# Patient Record
Sex: Male | Born: 1975
Health system: Southern US, Community
[De-identification: ages and names within clinical notes are randomized; demographics above are authoritative.]

## PROBLEM LIST (undated history)

## (undated) DIAGNOSIS — I10 Essential (primary) hypertension: Secondary | ICD-10-CM

## (undated) DIAGNOSIS — I219 Acute myocardial infarction, unspecified: Secondary | ICD-10-CM

## (undated) DIAGNOSIS — R42 Dizziness and giddiness: Secondary | ICD-10-CM

## (undated) DIAGNOSIS — T8859XA Other complications of anesthesia, initial encounter: Secondary | ICD-10-CM

## (undated) DIAGNOSIS — Z951 Presence of aortocoronary bypass graft: Secondary | ICD-10-CM

## (undated) DIAGNOSIS — M1990 Unspecified osteoarthritis, unspecified site: Secondary | ICD-10-CM

## (undated) DIAGNOSIS — F419 Anxiety disorder, unspecified: Secondary | ICD-10-CM

## (undated) DIAGNOSIS — R200 Anesthesia of skin: Secondary | ICD-10-CM

## (undated) DIAGNOSIS — T4145XA Adverse effect of unspecified anesthetic, initial encounter: Secondary | ICD-10-CM

## (undated) DIAGNOSIS — N200 Calculus of kidney: Secondary | ICD-10-CM

## (undated) DIAGNOSIS — Z720 Tobacco use: Secondary | ICD-10-CM

## (undated) DIAGNOSIS — I509 Heart failure, unspecified: Secondary | ICD-10-CM

## (undated) DIAGNOSIS — E785 Hyperlipidemia, unspecified: Secondary | ICD-10-CM

## (undated) DIAGNOSIS — E119 Type 2 diabetes mellitus without complications: Secondary | ICD-10-CM

## (undated) HISTORY — PX: ORTHOPEDIC SURGERY: SHX850

## (undated) HISTORY — PX: CORONARY ARTERY BYPASS GRAFT: SHX141

## (undated) HISTORY — PX: APPENDECTOMY: SHX54

## (undated) HISTORY — PX: CARDIAC CATHETERIZATION: SHX172

## (undated) HISTORY — DX: Acute myocardial infarction, unspecified: I21.9

## (undated) HISTORY — DX: Hyperlipidemia, unspecified: E78.5

## (undated) HISTORY — DX: Tobacco use: Z72.0

## (undated) HISTORY — PX: CORONARY STENT PLACEMENT: SHX1402

## (undated) HISTORY — DX: Heart failure, unspecified: I50.9

## (undated) HISTORY — PX: TONSILLECTOMY: SUR1361

---

## 1999-03-05 ENCOUNTER — Emergency Department (HOSPITAL_COMMUNITY): Admission: EM | Admit: 1999-03-05 | Discharge: 1999-03-05 | Payer: Self-pay | Admitting: Emergency Medicine

## 1999-08-21 ENCOUNTER — Emergency Department (HOSPITAL_COMMUNITY): Admission: EM | Admit: 1999-08-21 | Discharge: 1999-08-21 | Payer: Self-pay | Admitting: Emergency Medicine

## 1999-08-21 ENCOUNTER — Encounter: Payer: Self-pay | Admitting: Emergency Medicine

## 2000-02-04 ENCOUNTER — Emergency Department (HOSPITAL_COMMUNITY): Admission: EM | Admit: 2000-02-04 | Discharge: 2000-02-04 | Payer: Self-pay | Admitting: Emergency Medicine

## 2001-07-14 ENCOUNTER — Emergency Department (HOSPITAL_COMMUNITY): Admission: EM | Admit: 2001-07-14 | Discharge: 2001-07-15 | Payer: Self-pay | Admitting: *Deleted

## 2001-11-27 ENCOUNTER — Emergency Department (HOSPITAL_COMMUNITY): Admission: EM | Admit: 2001-11-27 | Discharge: 2001-11-27 | Payer: Self-pay | Admitting: Emergency Medicine

## 2002-03-11 ENCOUNTER — Encounter: Payer: Self-pay | Admitting: Emergency Medicine

## 2002-03-12 ENCOUNTER — Inpatient Hospital Stay (HOSPITAL_COMMUNITY): Admission: EM | Admit: 2002-03-12 | Discharge: 2002-03-13 | Payer: Self-pay | Admitting: Emergency Medicine

## 2002-05-07 ENCOUNTER — Encounter: Payer: Self-pay | Admitting: Emergency Medicine

## 2002-05-07 ENCOUNTER — Emergency Department (HOSPITAL_COMMUNITY): Admission: EM | Admit: 2002-05-07 | Discharge: 2002-05-07 | Payer: Self-pay | Admitting: Emergency Medicine

## 2004-03-07 ENCOUNTER — Emergency Department (HOSPITAL_COMMUNITY): Admission: EM | Admit: 2004-03-07 | Discharge: 2004-03-07 | Payer: Self-pay | Admitting: Emergency Medicine

## 2004-07-30 ENCOUNTER — Emergency Department (HOSPITAL_COMMUNITY): Admission: EM | Admit: 2004-07-30 | Discharge: 2004-07-30 | Payer: Self-pay | Admitting: Emergency Medicine

## 2004-08-20 ENCOUNTER — Ambulatory Visit: Payer: Self-pay | Admitting: Orthopedic Surgery

## 2005-05-29 ENCOUNTER — Emergency Department (HOSPITAL_COMMUNITY): Admission: EM | Admit: 2005-05-29 | Discharge: 2005-05-30 | Payer: Self-pay | Admitting: Emergency Medicine

## 2005-09-21 ENCOUNTER — Emergency Department (HOSPITAL_COMMUNITY): Admission: EM | Admit: 2005-09-21 | Discharge: 2005-09-21 | Payer: Self-pay | Admitting: Emergency Medicine

## 2006-06-25 ENCOUNTER — Emergency Department (HOSPITAL_COMMUNITY): Admission: EM | Admit: 2006-06-25 | Discharge: 2006-06-25 | Payer: Self-pay | Admitting: Emergency Medicine

## 2007-03-31 ENCOUNTER — Ambulatory Visit: Payer: Self-pay | Admitting: Psychiatry

## 2007-03-31 ENCOUNTER — Inpatient Hospital Stay (HOSPITAL_COMMUNITY): Admission: AD | Admit: 2007-03-31 | Discharge: 2007-04-04 | Payer: Self-pay | Admitting: Psychiatry

## 2007-03-31 ENCOUNTER — Emergency Department (HOSPITAL_COMMUNITY): Admission: EM | Admit: 2007-03-31 | Discharge: 2007-03-31 | Payer: Self-pay | Admitting: Emergency Medicine

## 2007-07-26 ENCOUNTER — Emergency Department (HOSPITAL_COMMUNITY): Admission: EM | Admit: 2007-07-26 | Discharge: 2007-07-26 | Payer: Self-pay | Admitting: *Deleted

## 2007-10-26 ENCOUNTER — Emergency Department (HOSPITAL_COMMUNITY): Admission: EM | Admit: 2007-10-26 | Discharge: 2007-10-26 | Payer: Self-pay | Admitting: Emergency Medicine

## 2007-11-04 ENCOUNTER — Emergency Department (HOSPITAL_COMMUNITY): Admission: EM | Admit: 2007-11-04 | Discharge: 2007-11-04 | Payer: Self-pay | Admitting: Emergency Medicine

## 2008-03-17 ENCOUNTER — Inpatient Hospital Stay (HOSPITAL_COMMUNITY): Admission: EM | Admit: 2008-03-17 | Discharge: 2008-03-20 | Payer: Self-pay | Admitting: Cardiology

## 2008-03-17 ENCOUNTER — Ambulatory Visit: Payer: Self-pay | Admitting: Cardiology

## 2008-03-17 ENCOUNTER — Ambulatory Visit: Payer: Self-pay | Admitting: Surgery

## 2008-04-03 ENCOUNTER — Ambulatory Visit: Payer: Self-pay | Admitting: Cardiology

## 2008-04-03 LAB — CONVERTED CEMR LAB
CO2: 24 meq/L (ref 19–32)
Calcium: 9.4 mg/dL (ref 8.4–10.5)
GFR calc Af Amer: 145 mL/min
Potassium: 4.2 meq/L (ref 3.5–5.1)
Sodium: 141 meq/L (ref 135–145)

## 2008-05-01 ENCOUNTER — Ambulatory Visit: Payer: Self-pay | Admitting: Surgery

## 2008-05-03 ENCOUNTER — Ambulatory Visit: Admission: RE | Admit: 2008-05-03 | Discharge: 2008-05-03 | Payer: Self-pay | Admitting: Surgery

## 2008-05-03 ENCOUNTER — Encounter: Payer: Self-pay | Admitting: Surgery

## 2008-05-03 ENCOUNTER — Encounter: Payer: Self-pay | Admitting: Thoracic Surgery (Cardiothoracic Vascular Surgery)

## 2008-05-07 ENCOUNTER — Inpatient Hospital Stay (HOSPITAL_COMMUNITY): Admission: RE | Admit: 2008-05-07 | Discharge: 2008-05-11 | Payer: Self-pay | Admitting: Surgery

## 2008-05-07 ENCOUNTER — Ambulatory Visit: Payer: Self-pay | Admitting: Cardiology

## 2008-05-07 ENCOUNTER — Ambulatory Visit: Payer: Self-pay | Admitting: Surgery

## 2008-05-10 ENCOUNTER — Encounter: Payer: Self-pay | Admitting: Surgery

## 2008-05-15 ENCOUNTER — Ambulatory Visit: Payer: Self-pay | Admitting: Surgery

## 2008-05-15 ENCOUNTER — Encounter: Admission: RE | Admit: 2008-05-15 | Discharge: 2008-05-15 | Payer: Self-pay | Admitting: Surgery

## 2008-05-30 DIAGNOSIS — E782 Mixed hyperlipidemia: Secondary | ICD-10-CM

## 2008-05-30 DIAGNOSIS — F17200 Nicotine dependence, unspecified, uncomplicated: Secondary | ICD-10-CM | POA: Insufficient documentation

## 2008-05-31 ENCOUNTER — Ambulatory Visit: Payer: Self-pay | Admitting: Cardiology

## 2008-06-05 ENCOUNTER — Ambulatory Visit: Payer: Self-pay | Admitting: Surgery

## 2008-09-26 ENCOUNTER — Ambulatory Visit: Payer: Self-pay | Admitting: Cardiology

## 2008-09-26 ENCOUNTER — Inpatient Hospital Stay (HOSPITAL_COMMUNITY): Admission: EM | Admit: 2008-09-26 | Discharge: 2008-09-28 | Payer: Self-pay | Admitting: Emergency Medicine

## 2008-09-26 ENCOUNTER — Encounter: Payer: Self-pay | Admitting: Emergency Medicine

## 2008-10-13 DIAGNOSIS — J40 Bronchitis, not specified as acute or chronic: Secondary | ICD-10-CM

## 2008-10-13 DIAGNOSIS — I219 Acute myocardial infarction, unspecified: Secondary | ICD-10-CM

## 2008-10-13 HISTORY — DX: Acute myocardial infarction, unspecified: I21.9

## 2008-10-16 ENCOUNTER — Ambulatory Visit: Payer: Self-pay | Admitting: Cardiology

## 2008-10-16 ENCOUNTER — Encounter: Payer: Self-pay | Admitting: Cardiology

## 2008-12-02 ENCOUNTER — Emergency Department (HOSPITAL_COMMUNITY): Admission: EM | Admit: 2008-12-02 | Discharge: 2008-12-02 | Payer: Self-pay | Admitting: Emergency Medicine

## 2008-12-04 ENCOUNTER — Ambulatory Visit: Payer: Self-pay | Admitting: Cardiology

## 2008-12-20 ENCOUNTER — Telehealth: Payer: Self-pay | Admitting: Cardiology

## 2008-12-24 ENCOUNTER — Telehealth: Payer: Self-pay | Admitting: Cardiology

## 2009-02-20 ENCOUNTER — Telehealth (INDEPENDENT_AMBULATORY_CARE_PROVIDER_SITE_OTHER): Payer: Self-pay | Admitting: *Deleted

## 2009-02-25 ENCOUNTER — Telehealth: Payer: Self-pay | Admitting: Cardiology

## 2009-02-26 ENCOUNTER — Encounter (INDEPENDENT_AMBULATORY_CARE_PROVIDER_SITE_OTHER): Payer: Self-pay | Admitting: *Deleted

## 2009-03-17 ENCOUNTER — Emergency Department (HOSPITAL_COMMUNITY): Admission: EM | Admit: 2009-03-17 | Discharge: 2009-03-17 | Payer: Self-pay | Admitting: Emergency Medicine

## 2009-04-16 ENCOUNTER — Ambulatory Visit: Payer: Self-pay | Admitting: Cardiology

## 2009-04-22 ENCOUNTER — Telehealth: Payer: Self-pay | Admitting: Cardiology

## 2009-05-08 ENCOUNTER — Emergency Department (HOSPITAL_COMMUNITY): Admission: EM | Admit: 2009-05-08 | Discharge: 2009-05-08 | Payer: Self-pay | Admitting: Emergency Medicine

## 2009-06-19 ENCOUNTER — Telehealth: Payer: Self-pay | Admitting: Cardiology

## 2009-07-05 ENCOUNTER — Telehealth: Payer: Self-pay | Admitting: Cardiology

## 2009-07-22 ENCOUNTER — Telehealth: Payer: Self-pay | Admitting: Cardiology

## 2009-08-13 ENCOUNTER — Telehealth: Payer: Self-pay | Admitting: Cardiology

## 2009-08-15 ENCOUNTER — Encounter (INDEPENDENT_AMBULATORY_CARE_PROVIDER_SITE_OTHER): Payer: Self-pay | Admitting: *Deleted

## 2009-09-01 ENCOUNTER — Emergency Department (HOSPITAL_COMMUNITY): Admission: EM | Admit: 2009-09-01 | Discharge: 2009-09-01 | Payer: Self-pay | Admitting: Emergency Medicine

## 2009-09-11 ENCOUNTER — Telehealth (INDEPENDENT_AMBULATORY_CARE_PROVIDER_SITE_OTHER): Payer: Self-pay | Admitting: *Deleted

## 2009-10-23 ENCOUNTER — Telehealth: Payer: Self-pay | Admitting: Cardiology

## 2009-10-23 ENCOUNTER — Emergency Department (HOSPITAL_COMMUNITY): Admission: EM | Admit: 2009-10-23 | Discharge: 2009-10-23 | Payer: Self-pay | Admitting: Emergency Medicine

## 2009-10-23 ENCOUNTER — Telehealth (INDEPENDENT_AMBULATORY_CARE_PROVIDER_SITE_OTHER): Payer: Self-pay | Admitting: *Deleted

## 2009-10-29 ENCOUNTER — Telehealth: Payer: Self-pay | Admitting: Cardiology

## 2010-01-22 ENCOUNTER — Telehealth: Payer: Self-pay | Admitting: Cardiology

## 2010-03-24 ENCOUNTER — Ambulatory Visit: Payer: Self-pay | Admitting: Internal Medicine

## 2010-03-24 ENCOUNTER — Inpatient Hospital Stay (HOSPITAL_COMMUNITY): Admission: EM | Admit: 2010-03-24 | Discharge: 2010-03-26 | Payer: Self-pay | Admitting: Emergency Medicine

## 2010-04-09 ENCOUNTER — Emergency Department (HOSPITAL_COMMUNITY): Admission: EM | Admit: 2010-04-09 | Discharge: 2010-04-09 | Payer: Self-pay | Admitting: Emergency Medicine

## 2010-04-11 ENCOUNTER — Emergency Department (HOSPITAL_COMMUNITY): Admission: EM | Admit: 2010-04-11 | Discharge: 2010-04-11 | Payer: Self-pay | Admitting: Family Medicine

## 2010-04-25 ENCOUNTER — Ambulatory Visit: Payer: Self-pay | Admitting: Cardiology

## 2010-04-30 ENCOUNTER — Telehealth: Payer: Self-pay | Admitting: Cardiology

## 2010-05-05 ENCOUNTER — Encounter (INDEPENDENT_AMBULATORY_CARE_PROVIDER_SITE_OTHER): Payer: Self-pay | Admitting: *Deleted

## 2010-05-19 ENCOUNTER — Ambulatory Visit: Payer: Self-pay | Admitting: Cardiology

## 2010-05-19 ENCOUNTER — Emergency Department (HOSPITAL_COMMUNITY): Admission: EM | Admit: 2010-05-19 | Discharge: 2010-05-19 | Payer: Self-pay | Admitting: Emergency Medicine

## 2010-07-25 ENCOUNTER — Telehealth: Payer: Self-pay | Admitting: Cardiovascular Disease

## 2010-08-09 ENCOUNTER — Emergency Department (HOSPITAL_COMMUNITY)
Admission: EM | Admit: 2010-08-09 | Discharge: 2010-08-09 | Payer: Self-pay | Source: Home / Self Care | Admitting: Emergency Medicine

## 2010-08-12 LAB — URINALYSIS, ROUTINE W REFLEX MICROSCOPIC
Specific Gravity, Urine: >1.03 — ABNORMAL HIGH (ref 1.005–1.030)
Urine Glucose, Fasting: NEGATIVE mg/dL
pH: 5.5 (ref 5.0–8.0)

## 2010-08-12 LAB — COMPREHENSIVE METABOLIC PANEL
AST: 23 U/L (ref 0–37)
Albumin: 4.3 g/dL (ref 3.5–5.2)
BUN: 9 mg/dL (ref 6–23)
CO2: 23 mEq/L (ref 19–32)
Calcium: 9.2 mg/dL (ref 8.4–10.5)
Chloride: 107 mEq/L (ref 96–112)
Creatinine, Ser: 1.01 mg/dL (ref 0.4–1.5)
Glucose, Bld: 101 mg/dL — ABNORMAL HIGH (ref 70–99)
Potassium: 3.6 mEq/L (ref 3.5–5.1)
Sodium: 139 mEq/L (ref 135–145)
Total Bilirubin: 0.6 mg/dL (ref 0.3–1.2)
Total Protein: 7.7 g/dL (ref 6.0–8.3)

## 2010-08-12 LAB — CBC
HCT: 45.2 % (ref 39.0–52.0)
Hemoglobin: 15.7 g/dL (ref 13.0–17.0)
MCH: 31 pg (ref 26.0–34.0)
Platelets: 276 10*3/uL (ref 150–400)
RBC: 5.06 MIL/uL (ref 4.22–5.81)
WBC: 8.7 10*3/uL (ref 4.0–10.5)

## 2010-08-12 LAB — DIFFERENTIAL
Basophils Absolute: 0 10*3/uL (ref 0.0–0.1)
Basophils Relative: 0 % (ref 0–1)
Eosinophils Relative: 2 % (ref 0–5)
Neutro Abs: 4.8 10*3/uL (ref 1.7–7.7)

## 2010-08-12 LAB — POCT CARDIAC MARKERS: Troponin i, poc: <0.05 ng/mL (ref 0.00–0.09)

## 2010-08-19 NOTE — Progress Notes (Signed)
Summary: refill  Phone Note Refill Request Message from:  Patient on October 23, 2009 10:48 AM  Refills Requested: Medication #1:  SIMVASTATIN 80MG  qd   Supply Requested: 3 months  Medication #2:  METOPROLOL TARTRATE 25 MG TABS Take one tablet by mouth twice a day   Supply Requested: 3 months [facility] in [place]   Method Requested: Fax to Local Pharmacy Initial call taken by: Migdalia Dk,  October 23, 2009 10:48 AM  Follow-up for Phone Call        Rx faxed to pharmacy Follow-up by: Oswald Hillock,  October 23, 2009 4:15 PM    Prescriptions: SIMVASTATIN 80MG  qd  #30 x 3   Entered by:   Oswald Hillock   Authorized by:   Lenoria Farrier, MD, [facility]   Signed by:   Oswald Hillock on 10/23/2009   Method used:   Faxed to ...       [address] [phone]* (retail)       [address]       [place], [place]  [address]       Ph: [phone] or [phone]       Fax: [phone]   RxID:   [id number] METOPROLOL TARTRATE 25 MG TABS (METOPROLOL TARTRATE) Take one tablet by mouth twice a day  #60 x 3   Entered by:   Oswald Hillock   Authorized by:   Lenoria Farrier, MD, [facility]   Signed by:   Oswald Hillock on 10/23/2009   Method used:   Faxed to ...       [address] [phone]* (retail)       [address]       [place], [place]  [address]       Ph: [phone] or [phone]       Fax: [phone]   RxID:   [id number]

## 2010-08-19 NOTE — Progress Notes (Signed)
Summary: Plavix   Phone Note Outgoing Call Call back at [phone]   Call placed by: Sherri Rad, RN, BSN,  August 13, 2009 10:16 AM Call placed to: Patient Summary of Call: [facility] to let him know that Plavix has been delivered from [facility]. Sherri Rad, RN, BSN  August 13, 2009 10:18 AM  [facility]. Sherri Rad, RN, BSN  August 14, 2009 10:54 AM  Letter mailed to pt to come pick up Plavix.  Initial call taken by: Sherri Rad, RN, BSN,  August 15, 2009 11:52 AM

## 2010-08-19 NOTE — Letter (Signed)
Summary: Generic Letter  Architectural technologist, Main Office  [address]   [place], [place] [address]   Phone: [phone]  Fax: [phone]        August 15, 2009 MRN: [id number]    [name] [address] [place], [place]  [address]    Dear [name],  I have been trying to reach you by phone to let you know that we have received your Plavix from the drug company. This has been placed at the front desk with your name on it. Please come by to pick this up at your convenience. Thank you.        Sincerely,  Sherri Rad, RN, BSN  This letter has been electronically signed by your physician.

## 2010-08-19 NOTE — Assessment & Plan Note (Signed)
Summary: eph   Visit Type:  Follow-up Primary Provider:  none   History of Present Illness: [name] is 35 years old and return for followup management of CAD. In 2009 he had an inferior MI treated with a bare-metal stent to the RCA and subsequently he had bypass surgery for multivessel disease. In March of 2010 he had a non-ST elevation MI and was found to have occluded vein graft to the diagonal branch the LAD and occluded vein graft the circumflex artery. He underwent a drug and stent to the circumflex artery.  He did well after that but in September of 2001 was admitted with chest pain. Catheterization showed minimal change O. there was some worsening and a small posterolateral branch. We elected medical treatment.  He has done well since that time has had no recurrent chest pain shortness breath or palpitations.  He works third shift at [employer] now. This is been a little difficult for him.  His past history is significant for hyperlipidemia and has and he has a previous history of alcohol and drug abuse.  Current Medications (verified): 1)  Metoprolol Tartrate 25 Mg Tabs (Metoprolol Tartrate) .... Take One Tablet By Mouth Twice A Day 2)  Simvastatin 80mg  .... Qd 3)  Plavix 75 Mg Tabs (Clopidogrel Bisulfate) .... Take One Tablet By Mouth Daily 4)  Nitroglycerin 0.4 Mg Subl (Nitroglycerin) .... One Tablet Under Tongue Every 5 Minutes As Needed For Chest Pain---May Repeat Times Three  Allergies (verified): 1)  ! Codeine  Past History:  Past Medical History: Reviewed history from 10/13/2008 and no changes required. Current Problems:  MYOCARDIAL INFARCTION (ICD-410.90) BRONCHITIS (ICD-490) TOBACCO USE (ICD-305.1) HYPERLIPIDEMIA-MIXED (ICD-272.4) CAD, AUTOLOGOUS BYPASS GRAFT (ICD-414.02)         status post recent diaphragmatic wall          infarction Rx [facility] RCA 03-11-08 with subsequent CABG 05-07-08.  Hx prior EtOH and substance abuse  Good left ventricular function.  Cigarette use.   Review of Systems       ROS is negative except as outlined in HPI.   Vital Signs:  Patient profile:   35 year old male Height:      69 inches Weight:      277 pounds BMI:     41.05 Pulse rate:   79 / minute BP sitting:   134 / 85  (left arm) Cuff size:   large  Vitals Entered By: Burnett Kanaris, CNA (April 25, 2010 10:14 AM)  Physical Exam  Additional Exam:  Gen. Well-nourished, in no distress   Neck: No JVD, thyroid not enlarged, no carotid bruits Lungs: No tachypnea, clear without rales, rhonchi or wheezes Cardiovascular: Rhythm regular, PMI not displaced,  heart sounds  normal, no murmurs or gallops, no peripheral edema, pulses normal in all 4 extremities. Abdomen: BS normal, abdomen soft and non-tender without masses or organomegaly, no hepatosplenomegaly. MS: No deformities, no cyanosis or clubbing   Neuro:  No focal sns   Skin:  no lesions    Impression & Recommendations:  Problem # 1:  CAD, AUTOLOGOUS BYPASS GRAFT (ICD-414.02) He had previous bypass surgery and multiple PCI procedures as described in history of present illness. A recent catheterization which showed little change. He's had no recurrent chest pain this problem appears stable. His updated medication list for this problem includes:    Metoprolol Tartrate 25 Mg Tabs (Metoprolol tartrate) .Marland Kitchen... Take two tablets by mouth every morning and one tablet by mouth every evening  Plavix 75 Mg Tabs (Clopidogrel bisulfate) .Marland Kitchen... Take one tablet by mouth daily    Nitroglycerin 0.4 Mg Subl (Nitroglycerin) ..... One tablet under tongue every 5 minutes as needed for chest pain---may repeat times three  Problem # 2:  HYPERLIPIDEMIA-MIXED (ICD-272.4) This is managed with simvastatin.  Patient Instructions: 1)  Increase metoprolol 25mg  to two tablets every morning and one tablet every evening. 2)  Your physician wants you to follow-up in: 6 months with Dr. Clifton James.  You will receive a reminder letter  in the mail two months in advance. If you don't receive a letter, please call our office to schedule the follow-up appointment. Prescriptions: METOPROLOL TARTRATE 25 MG TABS (METOPROLOL TARTRATE) Take two tablets by mouth every morning and one tablet by mouth every evening  #270 x 3   Entered by:   Sherri Rad, RN, BSN   Authorized by:   Lenoria Farrier, MD, [facility]   Signed by:   Sherri Rad, RN, BSN on 04/25/2010   Method used:   Electronically to        [facility]  [address] (retail)       [address]       [place], [place]  [address]       Ph: [phone]       Fax: [phone]   RxID:   [id number]

## 2010-08-19 NOTE — Progress Notes (Signed)
Summary: Plavix- [facility]   Phone Note Outgoing Call   Call placed by: Sherri Rad, RN, BSN,  April 30, 2010 3:13 PM Call placed to: Patient Summary of Call: I attempted to call the pt to let him know that his plavix has arrived from [facility] and has been placed at the front desk. No answer x 2 tries- phone rings until it cuts off. I will call back. Sherri Rad, RN, BSN  April 30, 2010 3:13 PM  I left a message for the pt to call. Sherri Rad, RN, BSN  April 30, 2010 3:55 PM  I left a message for the pt to call Sherri Rad, RN, BSN  May 01, 2010 5:47 PM  I have attempted to call the pt twice today and I only get is voice mail. Letter mailed to the pt. Initial call taken by: Sherri Rad, RN, BSN,  May 05, 2010 6:03 PM

## 2010-08-19 NOTE — Progress Notes (Signed)
Summary: order plavix  Phone Note Call from Patient Call back at [phone]   Caller: Spouse Reason for Call: Talk to Nurse Summary of Call: needs plavix order from [facility] Initial call taken by: Migdalia Dk,  October 23, 2009 10:46 AM  Follow-up for Phone Call        I left a message at the pt's contact # that I have called in his RX refill and I will contact him when it is in.   Follow-up by: Sherri Rad, RN, BSN,  October 23, 2009 2:36 PM

## 2010-08-19 NOTE — Progress Notes (Signed)
  Request Recieved from [facility] forwarded to [facility] for processing [facility]  September 11, 2009 11:49 AM

## 2010-08-19 NOTE — Progress Notes (Signed)
Summary: pt needs refill called into [facility]  Phone Note Refill Request Call back at [phone] Message from:  Patient on [facility]  Refills Requested: Medication #1:  PLAVIX 75 MG TABS Take one tablet by mouth daily pt needs refill call to [facility]  Initial call taken by: Omer Jack,  January 22, 2010 11:14 AM  Follow-up for Phone Call        Refill ordered. I have left a message on the pt's VM this has been done and I will call when it arrives. Follow-up by: Sherri Rad, RN, BSN,  January 22, 2010 2:06 PM     Appended Document: pt needs refill called into [facility] message left for pt, plavix at the front desk for pick up

## 2010-08-19 NOTE — Letter (Signed)
Summary: Generic Letter  Architectural technologist, Main Office  [address]   [place], [place] [address]   Phone: [phone]  Fax: [phone]        May 05, 2010 MRN: [id number]    PARTICK MUSSELMAN [address] [place], [place]  [address]    Dear Mr. THOMA,  I have attempted to reach you by phone to let you know your Plavix is in the office from the drug company. This has been placed at the front desk and you may pick it up anytime.         Sincerely,  Sherri Rad, RN, BSN  This letter has been electronically signed by your physician.

## 2010-08-19 NOTE — Progress Notes (Signed)
Summary: Plavix  Phone Note Outgoing Call   Call placed by: Sherri Rad, RN, BSN,  October 29, 2009 3:22 PM Call placed to: Patient Summary of Call: I left a message with the pt's family member to please let him know his Plavix has come to the office and he can pick it up any time.  Initial call taken by: Sherri Rad, RN, BSN,  October 29, 2009 3:22 PM

## 2010-08-19 NOTE — Progress Notes (Signed)
Summary: NEED SAMPLES OF PLAVIX  Phone Note Call from Patient Call back at [phone]   Caller: Patient Summary of Call: NEED SAMPLES OF PLAVIX Initial call taken by: Judie Grieve,  July 22, 2009 9:12 AM  Follow-up for Phone Call        Patient phoned  to see if medication has arrived.  Would like samples, if available.  Follow-up by: Burnard Leigh,  July 23, 2009 11:24 AM  Additional Follow-up for Phone Call Additional follow up Details #1::        returned call no aswer -left message for pt to  call back Additional Follow-up by: Burnett Kanaris, CNA,  July 23, 2009 1:38 PM     Appended Document: NEED SAMPLES OF PLAVIX left samples at front desk 8:25am 07-24-2008 DAJ

## 2010-08-21 NOTE — Progress Notes (Signed)
Summary: [facility] renewal application  Phone Note Outgoing Call   Call placed by: Sherri Rad, RN, BSN,  July 25, 2010 11:54 AM Call placed to: Patient Summary of Call: I left a message for the pt to call regarding Plavix renewal application. He is due to re-submit this now. Sherri Rad, RN, BSN  July 25, 2010 11:55 AM   I spoke with the pt. He will try to go to the Unemployment office on monday to get a proof of income statement. He will then try to come to the office to sign his form for [facility]. Sherri Rad, RN, BSN  July 25, 2010 12:54 PM   Follow-up for Phone Call        per pt calling back [phone] Lorne Skeens  July 25, 2010 12:17 PM   Form still at front desk for pt to complete. Sherri Rad, RN, BSN  July 30, 2010 6:07 PM   I checked the front desk today- pt's enevelop is gone. Follow-up by: Sherri Rad, RN, BSN,  August 04, 2010 3:04 PM  Additional Follow-up for Phone Call Additional follow up Details #1::        Per Alphonzo Lemmings Aaron-RN for Dr. Clifton James, the pt did sign his renewal form and she has faxed this back to [facility].  Additional Follow-up by: Sherri Rad, RN, BSN,  August 05, 2010 11:29 AM

## 2010-09-22 ENCOUNTER — Telehealth: Payer: Self-pay | Admitting: Cardiovascular Disease

## 2010-09-30 NOTE — Progress Notes (Signed)
Summary: refill to pt assistance program  Phone Note Refill Request Call back at [phone] Message from:  Patient  Refills Requested: Medication #1:  PLAVIX 75 MG TABS Take one tablet by mouth daily pt needs it called into the assistance program  Initial call taken by: Omer Jack,  September 22, 2010 2:25 PM  Follow-up for Phone Call        Called patient and LVMT[facility]. His Plavix has been @ the front desk since Jan., 2012. Whitney Maeola Sarah RN  September 23, 2010 5:31 PM  Follow-up by: Whitney Maeola Sarah RN,  September 23, 2010 5:31 PM     Appended Document: refill to pt assistance program I will keep his Plavix in my cart until patient comes to pick up*

## 2010-10-01 LAB — BASIC METABOLIC PANEL
CO2: 23 mEq/L (ref 19–32)
Calcium: 9.4 mg/dL (ref 8.4–10.5)
Glucose, Bld: 187 mg/dL — ABNORMAL HIGH (ref 70–99)
Sodium: 135 mEq/L (ref 135–145)

## 2010-10-01 LAB — POCT CARDIAC MARKERS
CKMB, poc: <1 ng/mL — ABNORMAL LOW (ref 1.0–8.0)
CKMB, poc: <1 ng/mL — ABNORMAL LOW (ref 1.0–8.0)
Myoglobin, poc: 40.8 ng/mL (ref 12–200)
Troponin i, poc: <0.05 ng/mL (ref 0.00–0.09)

## 2010-10-02 LAB — BASIC METABOLIC PANEL
BUN: 6 mg/dL (ref 6–23)
BUN: 6 mg/dL (ref 6–23)
CO2: 23 mEq/L (ref 19–32)
Calcium: 9.5 mg/dL (ref 8.4–10.5)
Creatinine, Ser: 0.85 mg/dL (ref 0.4–1.5)
GFR calc non Af Amer: >60 mL/min (ref >60)
GFR calc non Af Amer: >60 mL/min (ref >60)
Glucose, Bld: 109 mg/dL — ABNORMAL HIGH (ref 70–99)
Glucose, Bld: 115 mg/dL — ABNORMAL HIGH (ref 70–99)
Potassium: 3.8 mEq/L (ref 3.5–5.1)
Sodium: 139 mEq/L (ref 135–145)

## 2010-10-02 LAB — CK TOTAL AND CKMB (NOT AT ARMC)
CK, MB: 0.7 ng/mL (ref 0.3–4.0)
Relative Index: INVALID (ref 0.0–2.5)
Total CK: 65 U/L (ref 7–232)

## 2010-10-02 LAB — CBC
HCT: 41.1 % (ref 39.0–52.0)
HCT: 46.6 % (ref 39.0–52.0)
Hemoglobin: 14.1 g/dL (ref 13.0–17.0)
Hemoglobin: 14.3 g/dL (ref 13.0–17.0)
Hemoglobin: 16.2 g/dL (ref 13.0–17.0)
MCH: 32 pg (ref 26.0–34.0)
MCH: 32.3 pg (ref 26.0–34.0)
MCHC: 33.8 g/dL (ref 30.0–36.0)
MCHC: 34.8 g/dL (ref 30.0–36.0)
MCHC: 34.8 g/dL (ref 30.0–36.0)
MCV: 91.9 fL (ref 78.0–100.0)
MCV: 92.8 fL (ref 78.0–100.0)
Platelets: 234 10*3/uL (ref 150–400)
Platelets: 250 10*3/uL (ref 150–400)
RBC: 4.52 MIL/uL (ref 4.22–5.81)
RBC: 5.02 MIL/uL (ref 4.22–5.81)
RDW: 13.7 % (ref 11.5–15.5)
RDW: 13.8 % (ref 11.5–15.5)
WBC: 8.9 K/uL (ref 4.0–10.5)

## 2010-10-02 LAB — POCT CARDIAC MARKERS
CKMB, poc: <1 ng/mL — ABNORMAL LOW (ref 1.0–8.0)
Myoglobin, poc: 37.1 ng/mL (ref 12–200)
Troponin i, poc: <0.05 ng/mL (ref 0.00–0.09)

## 2010-10-02 LAB — PROTIME-INR
INR: 0.83 (ref 0.00–1.49)
Prothrombin Time: 11.6 s (ref 11.6–15.2)

## 2010-10-02 LAB — CARDIAC PANEL(CRET KIN+CKTOT+MB+TROPI)
CK, MB: 1 ng/mL (ref 0.3–4.0)
Troponin I: 0.02 ng/mL (ref 0.00–0.06)

## 2010-10-02 LAB — DIFFERENTIAL
Basophils Absolute: 0 10*3/uL (ref 0.0–0.1)
Basophils Relative: 0 % (ref 0–1)
Eosinophils Absolute: 0.2 K/uL (ref 0.0–0.7)
Eosinophils Relative: 2 % (ref 0–5)
Lymphocytes Relative: 28 % (ref 12–46)
Lymphs Abs: 2.5 K/uL (ref 0.7–4.0)
Monocytes Absolute: 0.7 K/uL (ref 0.1–1.0)
Monocytes Relative: 7 % (ref 3–12)
Neutro Abs: 5.5 10*3/uL (ref 1.7–7.7)
Neutrophils Relative %: 62 % (ref 43–77)

## 2010-10-02 LAB — BASIC METABOLIC PANEL WITH GFR
CO2: 23 meq/L (ref 19–32)
Chloride: 107 meq/L (ref 96–112)
GFR calc Af Amer: >60 mL/min (ref >60)
Potassium: 3.7 meq/L (ref 3.5–5.1)

## 2010-10-02 LAB — HEPARIN LEVEL (UNFRACTIONATED): Heparin Unfractionated: 0.15 IU/mL — ABNORMAL LOW (ref 0.30–0.70)

## 2010-10-02 LAB — TROPONIN I: Troponin I: 0.03 ng/mL (ref 0.00–0.06)

## 2010-10-02 LAB — APTT: aPTT: 30 seconds (ref 24–37)

## 2010-10-08 LAB — BASIC METABOLIC PANEL
BUN: 10 mg/dL (ref 6–23)
CO2: 22 mEq/L (ref 19–32)
Calcium: 8.9 mg/dL (ref 8.4–10.5)
Creatinine, Ser: 0.83 mg/dL (ref 0.4–1.5)
GFR calc non Af Amer: >60 mL/min (ref >60)
Glucose, Bld: 126 mg/dL — ABNORMAL HIGH (ref 70–99)
Sodium: 135 mEq/L (ref 135–145)

## 2010-10-08 LAB — CBC
MCHC: 34.5 g/dL (ref 30.0–36.0)
Platelets: 249 10*3/uL (ref 150–400)
RDW: 13.7 % (ref 11.5–15.5)

## 2010-10-08 LAB — DIFFERENTIAL
Basophils Absolute: 0 10*3/uL (ref 0.0–0.1)
Basophils Relative: 1 % (ref 0–1)
Lymphocytes Relative: 28 % (ref 12–46)
Neutro Abs: 4.9 10*3/uL (ref 1.7–7.7)
Neutrophils Relative %: 61 % (ref 43–77)

## 2010-10-08 LAB — POCT CARDIAC MARKERS: CKMB, poc: <1 ng/mL — ABNORMAL LOW (ref 1.0–8.0)

## 2010-10-25 LAB — POCT CARDIAC MARKERS: Myoglobin, poc: 48.8 ng/mL (ref 12–200)

## 2010-10-25 LAB — COMPREHENSIVE METABOLIC PANEL
ALT: 23 U/L (ref 0–53)
Alkaline Phosphatase: 58 U/L (ref 39–117)
CO2: 23 mEq/L (ref 19–32)
Calcium: 9 mg/dL (ref 8.4–10.5)
GFR calc non Af Amer: >60 mL/min (ref >60)
Glucose, Bld: 112 mg/dL — ABNORMAL HIGH (ref 70–99)
Potassium: 3.7 mEq/L (ref 3.5–5.1)
Sodium: 137 mEq/L (ref 135–145)

## 2010-10-25 LAB — DIFFERENTIAL
Basophils Absolute: 0 10*3/uL (ref 0.0–0.1)
Basophils Relative: 0 % (ref 0–1)
Eosinophils Absolute: 0.1 10*3/uL (ref 0.0–0.7)
Neutrophils Relative %: 63 % (ref 43–77)

## 2010-10-25 LAB — CBC
Hemoglobin: 16.3 g/dL (ref 13.0–17.0)
MCHC: 35 g/dL (ref 30.0–36.0)
RBC: 4.96 MIL/uL (ref 4.22–5.81)

## 2010-10-28 LAB — BASIC METABOLIC PANEL
CO2: 23 mEq/L (ref 19–32)
Calcium: 8.9 mg/dL (ref 8.4–10.5)
Creatinine, Ser: 0.89 mg/dL (ref 0.4–1.5)
Glucose, Bld: 102 mg/dL — ABNORMAL HIGH (ref 70–99)

## 2010-10-28 LAB — DIFFERENTIAL
Basophils Absolute: 0 10*3/uL (ref 0.0–0.1)
Basophils Relative: 1 % (ref 0–1)
Eosinophils Absolute: 0.2 10*3/uL (ref 0.0–0.7)
Monocytes Absolute: 0.5 10*3/uL (ref 0.1–1.0)
Neutro Abs: 5.2 10*3/uL (ref 1.7–7.7)
Neutrophils Relative %: 66 % (ref 43–77)

## 2010-10-28 LAB — CBC
MCHC: 34.3 g/dL (ref 30.0–36.0)
RDW: 14.8 % (ref 11.5–15.5)

## 2010-10-28 LAB — POCT CARDIAC MARKERS
CKMB, poc: <1 ng/mL — ABNORMAL LOW (ref 1.0–8.0)
Myoglobin, poc: 44.1 ng/mL (ref 12–200)

## 2010-10-30 LAB — CBC
HCT: 47.4 % (ref 39.0–52.0)
Hemoglobin: 13.8 g/dL (ref 13.0–17.0)
MCHC: 34.2 g/dL (ref 30.0–36.0)
MCV: 91.4 fL (ref 78.0–100.0)
MCV: 90.3 fL (ref 78.0–100.0)
Platelets: 246 10*3/uL (ref 150–400)
Platelets: 211 10*3/uL (ref 150–400)
RBC: 4.33 MIL/uL (ref 4.22–5.81)
RDW: 14.1 % (ref 11.5–15.5)
RDW: 14.4 % (ref 11.5–15.5)
WBC: 7.9 10*3/uL (ref 4.0–10.5)

## 2010-10-30 LAB — MAGNESIUM: Magnesium: 2.1 mg/dL (ref 1.5–2.5)

## 2010-10-30 LAB — DIFFERENTIAL
Basophils Absolute: 0 10*3/uL (ref 0.0–0.1)
Basophils Relative: 0 % (ref 0–1)
Eosinophils Absolute: 0.2 10*3/uL (ref 0.0–0.7)
Eosinophils Relative: 3 % (ref 0–5)
Lymphocytes Relative: 26 % (ref 12–46)
Monocytes Absolute: 0.5 10*3/uL (ref 0.1–1.0)

## 2010-10-30 LAB — BASIC METABOLIC PANEL
BUN: 10 mg/dL (ref 6–23)
BUN: 5 mg/dL — ABNORMAL LOW (ref 6–23)
BUN: 7 mg/dL (ref 6–23)
CO2: 24 mEq/L (ref 19–32)
CO2: 24 mEq/L (ref 19–32)
CO2: 24 mEq/L (ref 19–32)
Calcium: 9 mg/dL (ref 8.4–10.5)
Chloride: 103 mEq/L (ref 96–112)
Creatinine, Ser: 0.81 mg/dL (ref 0.4–1.5)
Creatinine, Ser: 0.83 mg/dL (ref 0.4–1.5)
GFR calc Af Amer: >60 mL/min (ref >60)
GFR calc non Af Amer: >60 mL/min (ref >60)
GFR calc non Af Amer: >60 mL/min (ref >60)
Glucose, Bld: 121 mg/dL — ABNORMAL HIGH (ref 70–99)
Glucose, Bld: 106 mg/dL — ABNORMAL HIGH (ref 70–99)
Potassium: 3.8 mEq/L (ref 3.5–5.1)
Potassium: 3.4 mEq/L — ABNORMAL LOW (ref 3.5–5.1)

## 2010-10-30 LAB — COMPREHENSIVE METABOLIC PANEL
ALT: 24 U/L (ref 0–53)
BUN: 9 mg/dL (ref 6–23)
CO2: 25 mEq/L (ref 19–32)
Calcium: 9.2 mg/dL (ref 8.4–10.5)
GFR calc non Af Amer: >60 mL/min (ref >60)
Glucose, Bld: 115 mg/dL — ABNORMAL HIGH (ref 70–99)
Total Protein: 6.7 g/dL (ref 6.0–8.3)

## 2010-10-30 LAB — LIPID PANEL
Cholesterol: 250 mg/dL — ABNORMAL HIGH (ref 0–200)
HDL: 26 mg/dL — ABNORMAL LOW (ref >39)
Total CHOL/HDL Ratio: 9.6 RATIO
Triglycerides: 586 mg/dL — ABNORMAL HIGH (ref <150)

## 2010-10-30 LAB — PROTIME-INR
INR: 0.9 (ref 0.00–1.49)
INR: 1 (ref 0.00–1.49)
Prothrombin Time: 11.8 seconds (ref 11.6–15.2)
Prothrombin Time: 12 seconds (ref 11.6–15.2)

## 2010-10-30 LAB — CK TOTAL AND CKMB (NOT AT ARMC)
CK, MB: 3.6 ng/mL (ref 0.3–4.0)
Relative Index: INVALID (ref 0.0–2.5)
Total CK: 92 U/L (ref 7–232)

## 2010-10-30 LAB — CARDIAC PANEL(CRET KIN+CKTOT+MB+TROPI)
CK, MB: 65 ng/mL — ABNORMAL HIGH (ref 0.3–4.0)
Relative Index: 11.6 — ABNORMAL HIGH (ref 0.0–2.5)
Total CK: 613 U/L — ABNORMAL HIGH (ref 7–232)
Total CK: 823 U/L — ABNORMAL HIGH (ref 7–232)
Troponin I: 10.67 ng/mL (ref 0.00–0.06)

## 2010-10-30 LAB — TSH: TSH: 2.453 u[IU]/mL (ref 0.350–4.500)

## 2010-10-30 LAB — POCT CARDIAC MARKERS: Troponin i, poc: <0.05 ng/mL (ref 0.00–0.09)

## 2010-11-17 ENCOUNTER — Telehealth: Payer: Self-pay | Admitting: Cardiovascular Disease

## 2010-11-17 NOTE — Telephone Encounter (Signed)
Pt needs more plavix to be called in. Please call pt when meds are ready to be pick up from the office.

## 2010-11-18 ENCOUNTER — Other Ambulatory Visit: Payer: Self-pay | Admitting: *Deleted

## 2010-11-26 NOTE — Telephone Encounter (Signed)
Pt at another number right now [phone] please cll when plavix comes in

## 2010-11-26 NOTE — Telephone Encounter (Signed)
Spoke with patient's nephew. He will have [name] call me back. I will leave his Plavix from [facility] at the front desk for pick up.

## 2010-12-02 NOTE — Assessment & Plan Note (Signed)
OFFICE VISIT   [name], [name]  DOB:  06-May-1976                                        June 05, 2008  CHART #:  [id number]   The patient returns today for followup, status post coronary artery  bypass surgery on 05/07/2008.  I last saw him on May 15, 2008, at  which time, he was complaining of some slight sternal shifting and  popping sensation and still having a lot of chest wall pain.  He was  reassured that this was normal and he was given a prescription for  Darvocet.  I will also ask him to take ibuprofen as needed for pain.  He  said that since then, his sternal movement sensations have resolved  completely.  He was taking ibuprofen, but developed diarrhea and had to  stop taking that.  He has been taking Darvocet, which has been helping  his pain some.  He has had no shortness of breath and has been  increasing his activity.   PHYSICAL EXAMINATION:  VITAL SIGNS:  Blood pressure is 129/85, his pulse  is 75 and regular.  Respiratory rate is 18 and unlabored.  Oxygen  saturation on room air is 98%.  GENERAL:  He looks well.  CARDIAC:  Regular rate and rhythm with normal heart sounds.  LUNGS:  Clear.  Chest incision is healing well and the sternum is  stable.  EXTREMITIES:  His leg incision is healing well and there is no  peripheral edema.   MEDICATIONS:  Plavix 75 mg daily, Zocor 40 mg daily, Toprol-XL 25 mg  daily, fish oil daily, and Darvocet p.r.n. for pain.   IMPRESSION:  Overall, the patient is making a satisfactory recovery  following a surgery.  He still has some chest wall discomfort and I  would expect this to gradually resolve with time.  He is relatively  young and has a large muscular chest wall and these are the typical  patients who attend more chest wall discomfort.  I did write him another  prescription today for Darvocet #40 one q.4 h. p.r.n. for pain with no  refills.  I told him he could continue to walk as much as  possible, but  should refrain lifting anything heavier than 10 pounds for 3 months  after surgery.  He will  continue to follow up with Dr. Juanda Chance for his cardiology care and will  contact me if he develops any problems with his incisions.   Evelene Croon, M.D.  Electronically Signed   BB/MEDQ  D:  06/05/2008  T:  06/06/2008  Job:  [id number]   cc:   Everardo Beals. Juanda Chance, MD, [facility]

## 2010-12-02 NOTE — Assessment & Plan Note (Signed)
[facility]                            CARDIOLOGY OFFICE NOTE   NAME:[name], [name]                     MRN:          [id number]  DATE:05/31/2008                            DOB:          03-Feb-1976    PRIMARY CARE PHYSICIAN:  None.   CLINICAL HISTORY:  [name] is a 35 year old who returned for  management of coronary heart disease after his recent bypass surgery.  In August, he was hospitalized with a diaphragmatic wall infarction,  underwent a bare-metal stent to the right coronary artery.  He had  residual three-vessel disease and after about 6 weeks, underwent bypass  surgery by Dr. Laneta Simmers.  He has done quite well since that time.  He has  had no recurrent chest pain, shortness breath, or palpitations.  He is  now about 3 weeks out from his bypass surgery.   PAST MEDICAL HISTORY:  Significant for tobacco use and hyperlipidemia.  He has a previous history of substance and tobacco use.   CURRENT MEDICATIONS:  1. Plavix.  2. Aspirin.  3. Simvastatin 40 mg.  4. Ramipril 2.5 mg daily.   PHYSICAL EXAMINATION:  VITAL SIGNS:  The blood pressure was 138/70 and  the pulse 90 and regular.  NECK:  There is no venous distension.  The carotid pulses were full  without bruits.  CHEST:  Clear.  HEART:  Rhythm was regular.  No murmurs, rubs, or gallops.  ABDOMEN:  Soft, normal bowel sounds.  EXTREMITIES:  Peripheral pulses were full with no peripheral edema.   Electrocardiogram showed a previous diaphragmatic wall infarction.   IMPRESSION:  1. Coronary artery disease status post recent diaphragmatic wall      infarction treated with a stent to the right coronary artery and      subsequent coronary artery bypass surgery, now stable.  2. Good left ventricular function.  3. Hyperlipidemia.  4. Previous cigarette use.   RECOMMENDATIONS:  I think, [name], is doing well.  He asked about  returning to work and he does heavy work in Psychologist, forensic.  I  told him for this type of work, he probably would need more than 2  months for his sternum to heal and possibly 3 months.  I asked him to  chest with Dr. Laneta Simmers regarding this.  I think he is not currently  hypertensive.  I think we can stop the ramipril.  I will plan to see him  back in 5-6 months post surgery.     Bruce Elvera Lennox Juanda Chance, MD, [facility]  Electronically Signed    BRB/MedQ  DD: 05/31/2008  DT: 06/01/2008  Job #: [id number]   cc:   Evelene Croon, M.D.

## 2010-12-02 NOTE — Consult Note (Signed)
NAME:  [name], [name] NO.:  [id number]   MEDICAL RECORD NO.:  [id number]          PATIENT TYPE:  INP   LOCATION:  2917                         FACILITY:  [facility]   PHYSICIAN:  [name], M.D.     DATE OF BIRTH:  03/07/1976   DATE OF CONSULTATION:  03/19/2008  DATE OF DISCHARGE:                                 CONSULTATION   REASON FOR CONSULTATION:  Severe 3-vessel coronary artery disease,  status post acute inferior MI and status post acute PCI with drug-  eluting stent to the right coronary artery.   CLINICAL HISTORY:  I was asked by Dr. Juanda Chance to evaluate [name] for  consideration of coronary artery bypass graft surgery.  He is a 35-year-  old gentleman with a history of smoking who has no prior cardiac history  and does not have a primary physician who follows him for any medical  problems.  He reports having intermittent left arm pains for about 2  days.  On the evening of March 16, 2008, he developed chest pain while  watching TV.  He reported this as being left-sided chest pain that was  10/10 at its worst and associated with numbness and pain in his left  arm.  He had some nausea, vomiting, and diaphoresis.  He took some  Ultram without relief.  He presented to the [facility] Emergency Room  where he was diagnosed with an acute inferior MI.  He was taken to  cardiac catheterization lab early in the morning on March 17, 2008, for  catheterization and intervention.  Catheterization showed 99% mid right  coronary stenosis with TIMI II flow.  He also had 70% and 90% proximal  LAD stenosis as well as 40% mid LAD stenosis.  There was a moderate-  sized diagonal branch coming off of the proximal LAD in the area of  these stenosis.  There is moderate-sized intermediate that had 80%  stenosis and a moderate-sized marginal branch that had 90% and 70%  stenosis.  Ejection fraction is 50% with inferior hypokinesis.  He had  acute interventional with a  drug-eluting stent in the right coronary  artery with an excellent result, decreasing the degree of stenosis to 0  and reestablishment of TIMI III flow.  He did well.  His peak troponin  was 42 and his peak CPK was about 2000.   PAST MEDICAL HISTORY:  He has had no known medical or surgical illnesses  and has not been hospitalized previously.   REVIEW OF SYSTEMS:  GENERAL:  He denies any fever or chills.  He has had  no recent weight changes.  He does report some fatigue for several  weeks.  EYES:  Negative.  ENT: Negative.  ENDOCRINE:  He denies diabetes  and hypothyroidism.  CARDIOVASCULAR:  As above.  He has had no  exertional dyspnea.  He denies PND and orthopnea.  He denies peripheral  edema and palpitations.  RESPIRATORY:  He denies cough and sputum  production.  GI:  He has had no history of dysphasia.  He has had nausea  and vomiting associated  with his chest pain on the night of admission.  He denies melena and bright red blood per rectum.  GU:  He denies  dysuria and hematuria.  MUSCULOSKELETAL:  He denies arthralgias and  myalgias.  NEUROLOGIC:  He denies any focal weakness or numbness.  He  denies dizziness or syncope.  He has never had TIA or stroke.  PSYCHIATRIC:  Negative. HEMATOLOGICAL:  Negative.   ALLERGIES:  CODEINE, which he says causes anaphylaxis.   SOCIAL HISTORY:  He is unemployed and uninsured.  He smokes about 1 pack  of cigarettes per day.  He is a former alcohol abuser, but stopped  drinking about 2-3 months ago and has used drugs in the past, but quit  about 3 years ago.   FAMILY HISTORY:  He was adopted and does not know anything about his  family history.   PHYSICAL EXAMINATION:  VITAL SIGNS:  Blood pressure is 123/65, his pulse  is 76 and regular.  GENERAL:  He is a well-developed white male in no distress.  HEENT:  Normocephalic and atraumatic.  Pupils are equal and reactive to  light and accommodation.  Extraocular muscles are intact. His throat  is  clear.  NECK:  Normal carotid pulses bilaterally.  There are no bruits.  There  is no adenopathy or thyromegaly.  CARDIAC:  Regular rate and rhythm with normal S1 and S2.  There is no  murmur, rub, or gallop.  LUNGS:  Clear.  ABDOMEN:  Active bowel sounds.  His abdomen is soft and nontender.  There are no palpable masses or organomegaly.  EXTREMITIES:  No peripheral edema.  Pedal pulses are palpable  bilaterally.  SKIN:  Warm and dry.  He has multiple tattoos all over his body.  NEUROLOGIC:  He is alert and oriented x3.  Motor and sensory exams are  grossly normal.   IMPRESSION:  [name] has severe three-vessel coronary artery disease  presenting with an acute inferior myocardial infarction secondary to  subtotal occlusion of the mid right coronary artery that was treated  with a drug-eluting stent with an excellent result and no residual  stenosis.  He still has significant high-grade stenosis in the left  anterior descending and left circumflex territories that will require  treatment.  The options that are being considered are multivessel  percutaneous intervention and coronary artery bypass surgeries.  He  would require intervention on multiple arteries if he was treated with  stents and there is some concern about his ability to be compliant with  long-term Plavix therapy, given the fact that he has no job and no  insurance and a complicated family situation.  I agree that his best  long-term result would probably be with coronary artery bypass graft  surgery.  Ideally, it may be best to send him home on Plavix in a short  term and allow these stent site to heal some before proceeding with  coronary artery bypass graft surgery.  Given his recent percutaneous  coronary intervention with a drug-eluting stent, it may be best to  continue Plavix up to the time of surgery and accept the increased risk  of perioperative bleeding rather than risk acute stent thrombosis.  I   will be in [facility] for the next 4 weeks, but I plan on doing his  surgery in the first part of October.  I will plan to see him back in  the office to discuss this further with him and to set up a surgical  date.  We will need to decide whether to graft his right coronary  artery.  At this time, I think there is little indication to do that  since he had an excellent result with his drug-eluting stent and has no  residual stenosis in this artery.  I will discuss this with Dr. Juanda Chance  further before making a final decision.      [name], M.D.  Electronically Signed     BB/MEDQ  D:  03/19/2008  T:  03/20/2008  Job:  [id number]   cc:   Everardo Beals. Juanda Chance, MD, [facility]  [name], M.D.

## 2010-12-02 NOTE — Discharge Summary (Signed)
NAME:  [name], [name] NO.:  [id number]   MEDICAL RECORD NO.:  [id number]          PATIENT TYPE:  INP   LOCATION:  2927                         FACILITY:  [facility]   PHYSICIAN:  Everardo Beals. Juanda Chance, MD, FACCDATE OF BIRTH:  Nov 28, 1975   DATE OF ADMISSION:  09/26/2008  DATE OF DISCHARGE:  09/28/2008                               DISCHARGE SUMMARY   DISCHARGE DIAGNOSES:  1. Coronary artery disease/non-ST elevated myocardial infarction      status post cardiac catheterization, percutaneous coronary      intervention/intracoronary vascular ultrasound on this admission.      The patient with triple-vessel native coronary artery disease,      which is severe underwent 4-vessel coronary artery bypass graft      with successful placement of a drug-eluting stent to the obtuse      marginal 3.  Left internal mammary artery to the left anterior      descending patent.  The patient with totally occluded ramus branch.      The patient underwent intracoronary vascular ultrasound on September 27, 2008.  The patient with in-stent restenosis in mid right      coronary artery and bare-metal stent.  Intracoronary vascular      ultrasound showing heavy plaque burden from ostium of right      coronary artery extending down beyond the stented segment.  Non-ST      elevated myocardial infarction, felt to be secondary to acute      occlusion of the saphenous vein graft to the ramus and obtuse      marginal 3.  2. Ongoing tobacco abuse.  3. Hyperlipidemia.   PAST MEDICAL HISTORY:  1. Coronary artery disease.  2. Ongoing tobacco abuse.  3. Hyperlipidemia.   [name] is a 35 year old Caucasian gentleman who underwent a bare-  metal stent to the RCA in August 2009 and subsequent 3-vessel CABG with  alignment to the LAD, SVG to the diagonal, sequential SVG to the  intermediate obtuse marginal presented on day of admission complaining  of chest discomfort.  Pain improved significantly after  EMS transported  the patient to [facility].  He received IV nitroglycerin, IV  morphine, as well as IV Ativan.  The patient was transferred to [facility] for further management and taken to the cardiac catheterization lab  for reevaluation.  EKG showed sinus rhythm with a rate of 84 with left  axis deviation with 1-mm ST elevation in lead I and less than 1-mm ST  elevation in aVL.  In the cath lab on September 26, 2008, the patient was  found to have a patent alignment to the LAD.  The saphenous vein graft  to the ramus and obtuse marginal 3 likely acutely occluded but unable to  open at that time.  No intervention was performed on the totally  occluded ramus branch.  Plan was to bring the patient back for stage  intervention of the mid RCA in-stent restenosis continued nitroglycerin  and morphine, aspirin, Plavix, beta-blocker, and statin.  Smoking  cessation education was reinforced.  The patient is still having some  residual chest discomfort, returned to the cath lab on September 27, 2008.  The patient found to have mid RCA stented segment with moderate in-stent  restenosis.  IVUS showed heavy plaque burden from ostium of RCA  extending down beyond the stented segment.  The patient's non-STEMI was  felt to be secondary to acute occlusion of the SVG to the ramus, OM-3,  the RCA stenosis did not appear flow limiting.  The patient returned to  step-down unit, continue to have some chest pain that was felt to be  atypical.  Blood pressure remained in soft  around 100/50.  Heart rate stable in the 80s.  The patient was seen by  Dr. Juanda Chance, stable to be discharged home.  Financial concerns regarding  the cost of his medications, I have given the patient a 14-day free  supply card for his Plavix and then filled out the appropriate paperwork  for [name] to assist with his Plavix.  I have also given him an  application for the Rx together program to get a discount on his  simvastatin  and then he can get the metoprolol a 90-day supply at [facility] for 25 dollars.   He has been given all of this information along with prescriptions for:  1. Metoprolol 25 mg b.i.d.  2. Simvastatin 80 mg daily.  3. Aspirin 325 daily.  4. Plavix 75 daily.  5. Nitroglycerin as needed.   He has also been given extra set of prescriptions if he chooses to he  can go to [facility] and get obtain prescription at no cost and  establish primary care there as the patient does not currently have a  primary care physician.  [name] was extremely anxious and stressed  today regarding his recent diagnosis.  I have given him at his request  one prescription for Xanax 0.25 b.i.d. p.r.n. 20 tablets with no  refills.  He will need to follow up with [facility] or establish care  with a primary care physician for further management of his anxiety.  He  has also been given the post cardiac catheterization discharge  instructions.  Cardiac rehab has also spoken with him regarding  medication compliance and smoking cessation.   Duration of discharge encounter well over 30 minutes.      [name], ACNP      Bruce R. Juanda Chance, MD, [facility]  Electronically Signed    MB/MEDQ  D:  09/28/2008  T:  09/28/2008  Job:  [id number]   cc:   Dala Dock

## 2010-12-02 NOTE — H&P (Signed)
NAME:  [name], [name] NO.:  [id number]   MEDICAL RECORD NO.:  [id number]          PATIENT TYPE:  INP   LOCATION:  2927                         FACILITY:  [facility]   PHYSICIAN:  Arturo Morton. Riley Kill, MD, FACCDATE OF BIRTH:  1975-09-10   DATE OF ADMISSION:  09/26/2008  DATE OF DISCHARGE:                              HISTORY & PHYSICAL   PRIMARY CARDIOLOGIST:  Everardo Beals. Juanda Chance, MD, [facility]   CHIEF COMPLAINT:  Chest pain.   HISTORY OF PRESENT ILLNESS:  This is a 35 year old male with a known  history of coronary artery disease (status post bare-metal stents to RCA  in August 2009 and 3-vessel CABG, LIMA to LAD, SVG to diagonal,  sequential SVG to intermediate and obtuse marginal), ongoing tobacco  abuse disorder, and hyperlipidemia presenting with chest pain starting  last night at rest and then significantly worsening at 4:00 a.m. this  morning.  The patient states pain this a.m. is 8 or 9/10 at worst  associated with shortness of breath, nausea, tachy palpitations, and  lightheadedness with radiation from the substernal area to his back and  down both arms.  Pain improved significantly after EMS took the patient  to [facility], and he received IV nitroglycerin and IV  morphine, as well as IV lorazepam.  The patient was transferred to [facility] for nonurgent cardiac catheterization.  Today, he is still  experiencing 4 or 5/10 chest pain, associated with nausea, shortness of  breath, and mild lightheadedness.  Pain has some atypical  characteristics as it is worse with inspiration, coughing, and somewhat  reproducible.   PAST MEDICAL HISTORY:  1. Coronary artery disease (as described above).  2. Ongoing tobacco abuse disorder.  3. Hyperlipidemia.   SOCIAL HISTORY:  [place] in [place] his fiance.  He  is not working right now.  He has a 35- pack year smoke history,  currently smoking between a half and three-quarter pack per day.   He  drinks no alcohol.  No illicit drugs.  He takes a [employer] multivitamin,  eats a low-salt heart healthy diet.  He walks 8-10 miles a day without  symptoms normally.   FAMILY HISTORY:  Mother is deceased at age 53 with a positive history of  coronary artery disease.  Father deceased? age 3-45.  Positive history  of coronary artery disease and cancer.  He has no full siblings.   REVIEW OF SYSTEMS:  As described in HPI.  All other systems reviewed and  were negative.   ALLERGIES:  CODEINE (causes anaphylaxis).   MEDICATIONS:  1. Plavix 75 mg p.o. daily.  2. Aspirin 325 mg p.o. daily.  3. Simvastatin 40 mg p.o. daily.  4. Ramipril 2.5 mg p.o. daily.  In the emergency department, he was given:  1. Aspirin 324 mg p.o. x1.  2. Plavix 600 mg p.o. x1.  3. IV heparin 2500 units.  4. IV morphine 2 mg x2 doses.  5. IV lorazepam 2 mg x2 doses.  6. He is currently on IV nitrate drip.   PHYSICAL EXAMINATION:  VITAL SIGNS:  Temperature 97.9 degrees  Fahrenheit, BP 128/80, pulse 84, respiration 18, O2 saturation 98% on 2  liters by nasal cannula.  GENERAL:  He is alert and oriented x3, in no apparent distress.  He  spoke and moved easily without much distress and no respiratory  distress.  HEENT:  Head was normocephalic, atraumatic.  Pupils equal, round, and  reactive to light.  Extraocular muscles were intact.  Nares were patent  without discharge.  Dentition was good.  Oropharynx without erythema or  exudate.  NECK:  Supple without lymphadenopathy.  No JVD.  No thyromegaly.  No  bruits.  HEART:  Heart rate was regular.  Audible S1 and S2.  No clicks, rubs,  murmurs, or gallops.  Pulses were 2+ and equal in both upper and lower  extremities bilaterally.  LUNGS:  Clear to auscultation bilaterally.  SKIN:  No rashes, lesions, or petechiae.  ABDOMEN:  Soft, nontender, nondistended.  The patient is overweight.  He  had normal abdominal bowel sounds.  No rebound or guarding.  No  obvious  hepatosplenomegaly.  EXTREMITIES:  No clubbing, cyanosis, or edema.  MUSCULOSKELETAL:  No joint deformity or effusions.  No spinal or CVA  tenderness; however, chest is mildly tender.  NEUROLOGIC:  Cranial nerves II-XII grossly intact.  Strength is 5/5 in  all extremities and axial groups.  Normal sensation throughout and  normal cerebellar function.   RADIOLOGY:  1. The patient had chest x-ray that showed no acute findings.  2. EKG showed normal sinus rhythm with a rate of 84, left axis      deviation.  No evidence of hypertrophy.  There was 1-mm ST      elevation in lead I and less than 1-mm ST elevation in aVL.  There      were Q waves in II, III, and aVF; however, there are no changes      from prior EKG, performed on May 07, 2008.  PR 158, QRS 86, and      QTC 434.   LABORATORY VALUES:  WBC 6.8, hgb 16.2, HCT 47.4, PLT count 246.  Sodium  138, potassium 3.8, chloride 104, CO2 24, BUN 10, creatinine 0.89,  glucose 121.  First set of point-of-care markers were negative.  PT  11.8, INR 0.9.   ASSESSMENT AND PLAN:  This is a 35 year old male with a known history of  coronary artery disease status post bare-metal stent to the right  coronary artery and triple-vessel coronary artery bypass grafting, as  well as ongoing tobacco abuse and hyperlipidemia, presenting with  unstable angina with some atypical features.  Unstable angina:  We will proceed to cath today, nonurgently due to no  significant EKG changes, negative point-of-care markers, and the patient  being in stable condition.  However, we will continue his IV heparin,  aspirin, Plavix, statin, ACE inhibitor, and start a low-dose beta-  blocker, metoprolol 12.5 mg p.o. b.i.d.  We will also continue IV  morphine at 1-2 mg q.2 h. p.r.n. as the patient is still experiencing  significant chest pain, as well as p.o. Zofran 4 mg q.6 h. p.o. p.r.n.  We will also continue with IV nitroglycerin, and he will be admitted  to  step-down while he is  awaiting cardiac catheterization today.  Note, the patient had  significant patient education regarding the significant increase in risk  to future cardiac events with continued smoking from Dr. Riley Kill  including the pathophysiology of smoking on the cardiovascular system.  Jarrett Ables, PAC      Arturo Morton. Riley Kill, MD, [facility]  Electronically Signed    MS/MEDQ  D:  09/26/2008  T:  09/26/2008  Job:  [id number]

## 2010-12-02 NOTE — Discharge Summary (Signed)
NAME:  [name], [name] NO.:  [id number]   MEDICAL RECORD NO.:  [id number]          PATIENT TYPE:  INP   LOCATION:  2917                         FACILITY:  [facility]   PHYSICIAN:  Everardo Beals. Juanda Chance, MD, FACCDATE OF BIRTH:  1976-06-11   DATE OF ADMISSION:  03/17/2008  DATE OF DISCHARGE:  03/20/2008                               DISCHARGE SUMMARY   PRIMARY CARDIOLOGIST:  Everardo Beals. Juanda Chance, MD, [facility]   CONSULTING PHYSICIAN:  Evelene Croon, MD, [facility]   Primary care physician is not listed.   PROCEDURES PERFORMED DURING HOSPITALIZATION:  1. Cardiac catheterization.      a.     Left anterior descending; 90% proximal circumflex, 80%       ramus, 90% PL, right coronary artery 99%.  Mid left ventricle       revealing basal hypokinesis.      b.     Status post percutaneous coronary intervention and stenting       with bare-metal stent Liberte 3.5 mm x 32 mm placed to the right       coronary artery reducing it from 99% to 0% with TIMI 2 to 3 flow.   FINAL DISCHARGE DIAGNOSES:  1. ST-elevated myocardial infarction.  2. Coronary artery disease.      a.     Cardiac catheterization on March 17, 2008.      b.     Status post stenting to the right coronary artery reducing       it from 99% to 0% using a Liberte bare-metal stent per Dr. Juanda Chance.  3. Tobacco abuse.  4. Hypercholesterolemia.   HOSPITAL COURSE:  This is a 35 year old Caucasian male with no prior  history of coronary artery disease with no primary care physician who is  a heavy smoker who presented to [facility] Emergency Room around 10 p.m.  the night of admission secondary to chest pain which he developed prior  to admission.  The patient was seen by physician in [facility] ER and  EKG showed an acute diaphragmatic wall infarction.  The patient was  transferred emergently to [facility] for cardiac catheterization and  possible intervention.   The patient was seen and examined by Dr. Charlies Constable and the patient  was  found to have severe three-vessel coronary artery disease with  culprit lesion be in the right coronary artery.  The patient had Liberte  bare-metal stent placed a 3.5 mm x 32 mm to the mid right coronary  artery reducing stenosis from 99% to 0%.  The patient recovered well  without evidence of bleeding, hematoma, or signs of infection.   Dr. Laneta Simmers was consulted secondary to three-vessel coronary artery  disease concerning need for coronary artery bypass grafting.  He did see  the patient on March 19, 2008, and agreed that the patient should  proceed with coronary artery bypass grafting in approximately 4-6 weeks  after stent to the right coronary artery is healed.  The patient needs  to follow up with Dr. Laneta Simmers in approximately 4-6 weeks to schedule this  surgery and will be continued on Plavix  until then.   The patient was also given some smoking cessation instructions and  placed on Wellbutrin and nicotine patch 14 mg prior to discharge.  The  patient was also seen by social worker and drug assistance forms were  provided along with 2 weeks of Plavix given to him for free with  associated prescription.  The patient was without any complaints of  chest pain since admission and was seen and examined by Dr. Charlies Constable  on day of discharge and found to be stable.  He will follow up with Dr.  Juanda Chance in approximately 2 weeks and Dr. Laneta Simmers in approximately 4-6  weeks.   DISCHARGE LABS:  Sodium 135, potassium 3.7, chloride 102, CO2 24,  glucose 92, BUN 14, creatinine 0.93, cholesterol 226, triglycerides 289,  HDL 33, LDL 135.  Hemoglobin 16.0, hematocrit 47.0, white blood cells  8.8, platelets 226.  Chest x-ray on day of discharge revealing no acute  disease.   VITAL SIGNS ON DISCHARGE:  Blood pressure 100/70, heart rate 70, and  respirations 24.   EKG on discharge revealing normal sinus rhythm with left axis deviation,  inferior infarct, and no other changes.   DISCHARGE  MEDICATIONS:  1. Plavix 75 mg daily.      a.     The patient has been given prescription for 14 days with a       pack to be provided to him for free of charge Plavix.      b.     The patient has a drug assistant form which is filled out       concerning Plavix assistance.  2. Aspirin 325 mg daily.  3. Simvastatin 40 mg at bedtime.  4. Toprol 50 mg daily.  5. Wellbutrin 150 mg daily x3 days, then twice a day.  6. Nicotine patch 14 mg daily.  7. Ramipril 2.5 mg daily.  8. Nitroglycerin 0.4 mg under the tongue as needed for chest pain.   ALLERGIES:  The patient is allergic to CODEINE.   FOLLOWUP PLANS AND APPOINTMENTS:  1. The patient will follow up with Dr. Charlies Constable on April 03, 2008, at 2:45 p.m.  2. The patient is scheduled to see Dr. Laneta Simmers on Tuesday, April 24, 2008, at 12:30 p.m.  3. The patient is given postcardiac catheterization instructions with      particular emphasis on the right groin site for evidence of      bleeding, hematoma, or signs of infection.  4. The patient has been advised on drug assistance program.  5. The patient has been given a statement stating that he has been      hospitalized for myocardial infarction with      followup coronary artery bypass grafting to be scheduled on October      for which he is to give to his attorney as he is currently      undergoing divorce proceedings and is in need of this for records.   TIME SPENT WITH THE PATIENT TO INCLUDE PHYSICIAN TIME:  40 minutes.      Bettey Mare. Lyman Bishop, NP      Everardo Beals. Juanda Chance, MD, [facility]  Electronically Signed    KML/MEDQ  D:  03/20/2008  T:  03/21/2008  Job:  [id number]   cc:   Evelene Croon, M.D.

## 2010-12-02 NOTE — Op Note (Signed)
NAME:  [name], BAIN NO.:  [id number]   MEDICAL RECORD NO.:  [id number]          PATIENT TYPE:  INP   LOCATION:  2305                         FACILITY:  [facility]   PHYSICIAN:  Evelene Croon, M.D.     DATE OF BIRTH:  1975-09-26   DATE OF PROCEDURE:  05/07/2008  DATE OF DISCHARGE:                               OPERATIVE REPORT   PREOPERATIVE DIAGNOSIS:  Severe three-vessel coronary artery disease.   POSTOPERATIVE DIAGNOSIS:  Severe three-vessel coronary artery disease.   OPERATIVE PROCEDURE:  Median sternotomy, extracorporeal circulation,  coronary artery bypass graft surgery x4 using a left internal mammary  artery graft to the left anterior descending coronary, with a saphenous  vein graft to the diagonal branch of the LAD, and a sequential saphenous  vein graft to the intermediate and obtuse marginal branches of the left  circumflex coronary artery.  Endoscopic vein harvesting from the right  leg.   ATTENDING SURGEON:  Evelene Croon, MD   ASSISTANT:  Kerin Perna, MD   SECOND ASSISTANT:  Stephanie Acre Dominick, [place]   ANESTHESIA:  General endotracheal.   CLINICAL HISTORY:  This patient is a 35 year old gentleman with a family  history of heart disease who presented in August with an acute inferior  MI.  Cardiac catheterization showed high-grade right coronary artery  stenosis.  This was treated with a drug-eluting stent on March 17, 2008.  He had an excellent result with a 0 degree stenosis in TIMI-3  flow.  Catheterization also showed high-grade proximal LAD and diagonal  stenosis as well as significant stenosis in the intermediate and obtuse  marginal branches.  Left ventricular function was well preserved.  Since  he had a drug-eluting stent placed, the plan was to send him home on  Plavix for about a month prior to proceeding with coronary artery bypass  graft surgery.  I saw him back in the office on May 01, 2008, at  which time he was doing well  without any chest pain or shortness of  breath.  He took the Plavix until the prescription was completed and  then did not have money to refill the prescription.  He did not have any  chest pain or pressure but has had some exertional shortness of breath  and fatigue.  I discussed the operative procedure of coronary bypass  surgery with him and his girlfriend.  We discussed alternatives,  benefits, and risks including but not limited to bleeding, blood  transfusion, infection, stroke, myocardial infarction, graft failure,  and death.  I also discussed the alternative of bypassing the right  coronary artery despite the fact that he had an excellent result with  stenting.  I did not feel that this would be best since this was a large  vessel and he had an excellent result with that.  I did discuss a small  but real risk that this could worsen over time and require further  intervention or could occlude acutely postoperatively.  He understood  all these and agreed to proceed.   OPERATIVE PROCEDURE:  The patient was taken to the operating  room and  placed on table in a supine position.  After induction of general  endotracheal anesthesia, a Foley catheter was placed into the bladder  using sterile technique.  Then the chest, abdomen, and both lower  extremities were prepped and draped in usual sterile manner.  The chest  was entered through a median sternotomy incision.  The pericardium was  opened midline.  Examination of the heart showed good ventricular  contractility.  The ascending aorta had no palpable plaques in it.   Then the left internal mammary artery was harvested from the chest wall  as a pedicle graft.  This was a medium caliber vessel with excellent  blood flow through it.  At the same time, a segment of greater saphenous  vein was harvested from the right leg using endoscopic vein harvest  technique.  This vein was a medium size and good quality.   Then, the patient was  heparinized and when an adequate activated  clotting time was achieved, the distal ascending aorta was cannulated  using a 20-French aortic cannula for arterial inflow.  Venous outflow  was achieved using a two-stage venous cannula through the right atrial  appendage.  An antegrade cardioplegia and vent cannula was inserted into  the aortic root.   The patient was placed on cardiopulmonary bypass and distal coronaries  were identified.  The LAD was a large vessel that wrapped around the  apex of the heart.  It was intramyocardial and proximal midportions but  exited at the junction with the distal third where it was suitable for  grafting.  The diagonal branch was a medium size graftable vessel.  The  intermediate and obtuse marginal branches were both moderate size  graftable vessels.  The right coronary artery gave off moderate-sized  posterior descending and large posterior descending branch, neither  which of any disease in them.   Then, the aorta was crossclamped and 500 mL of cold blood antegrade  cardioplegia was administered in the aortic root with quick arrest of  the heart.  Systemic hypothermia to 28 degrees centigrade and topical  hypothermia with iced saline was used.  A temperature probe was placed  in the septum and an insulating pad in the pericardium.   Then, the first distal anastomosis was performed to the intermediate  coronary artery.  The internal diameter was about 1.75 mm.  Conduit used  was a segment of greater saphenous vein and the anastomosis performed in  a sequential side-to-side manner using continuous 7-0 Prolene suture.  Flow was noted through the graft and was excellent.   The second distal anastomosis was performed to the obtuse marginal  branch.  The internal diameter was also about 1.75 mm.  The conduit used  was the same segment of greater saphenous vein and the anastomosis  performed in a sequential end-to-side manner using a continuous 7-0   Prolene suture.  Flow was noted through the graft and was excellent.  Then another dose of cardioplegia was given down the vein graft and the  aortic root.   The third distal anastomosis was performed to the diagonal branch.  The  internal diameter was 1.6 mm.  The conduit used was a second segment of  greater saphenous vein and the anastomosis performed in end-to-side  manner using a continuous 7-0 Prolene suture.  Flow was noted through  the graft and was excellent.   The fourth distal anastomosis was performed to the distal LAD.  The  internal diameter was about  2 mm.  The conduit used was the left  internal mammary graft and this was brought through an opening in the  left pericardium, anterior to the phrenic nerve.  This was anastomosed  to the LAD in end-to-side manner using continuous 8-0 Prolene suture.  The pedicle was sutured to the epicardium with 6-0 Prolene sutures to  prevent rotation.  Then, the patient was rewarmed to 37 degrees  centigrade.  Another dose of cardioplegia was given and then the two  proximal vein graft anastomosis were performed to the aortic root in an  end-to-side manner using continuous 6-0 Prolene suture.  Then, the clamp  was removed from mammary pedicle.  There was rapid warming of the  ventricular septum and return of spontaneous ventricular fibrillation.  Crossclamp was removed with time of 60 minutes.  There was spontaneous  return of sinus rhythm.  The proximal and distal anastomoses appeared  hemostatic while the grafts satisfactory.  Graft marker placed around  the proximal anastomoses.  Two temporary right ventricular and right  atrial pacing wires were placed above through the skin.  When the  patient was rewarmed to 37 degrees centigrade, he was weaned from  cardiopulmonary bypass on no inotropic agents.  Total bypass time was 83  minutes.  Cardiac function appeared excellent with a cardiac output of 5  liters a minute.  Protamine was given  , and the venous and aortic  cannula were removed without difficulty.  Hemostasis was achieved.  Three chest tubes were placed with tube in the post pericardium, one in  the left pleural space and one in the anterior mediastinum.  The sternum  was then closed with double #6 stainless steel wires.  The fascia was  closed with continuous #1 Vicryl suture.  Subcutaneous tissue was closed  with continuous 2-0 Vicryl and skin with 3-0 Vicryl subcuticular  closure.  The lower extremity vein harvest site was closed in layers in  similar manner.  The sponge, needle, and instrument counts were correct  according to scrub nurse.  Dry sterile dressing was applied over the  incisions around the chest tubes, which were Pleur-Evac suction.  The  patient remained hemodynamically stable and transferred to the SICU in  stable condition.      Evelene Croon, M.D.  Electronically Signed     BB/MEDQ  D:  05/07/2008  T:  05/08/2008  Job:  [id number]   cc:   Everardo Beals. Juanda Chance, MD, [facility]

## 2010-12-02 NOTE — Cardiovascular Report (Signed)
NAME:  [name], [name] NO.:  [id number]   MEDICAL RECORD NO.:  [id number]          PATIENT TYPE:  INP   LOCATION:  2927                         FACILITY:  [facility]   PHYSICIAN:  Verne Carrow, MDDATE OF BIRTH:  [birth year]   DATE OF PROCEDURE:  09/27/2008  DATE OF DISCHARGE:  09/26/2008                            CARDIAC CATHETERIZATION   PRIMARY CARDIOLOGIST:  Everardo Beals. Juanda Chance, MD, [facility]   PROCEDURES PERFORMED:  1. Right coronary artery angiogram.  2. Intravascular ultrasound of the right coronary artery.  3. Placement of an Angio-Seal femoral artery closure device.   OPERATOR:  Verne Carrow, MD   INDICATIONS:  This is a 35 year old Caucasian male with a history of  known coronary artery disease, status post an acute myocardial  infarction in August 2009, at which time a bare-metal stent was placed  in the mid right coronary artery.  The patient was also found to have  severe disease in the LAD and the circumflex and was sent for 4-vessel  bypass in October 2009.  He was admitted to the hospital yesterday with  complaints of severe substernal chest pressure.  During diagnostic left  heart catheterization, he was found to have total occlusion of the  saphenous vein graft to the diagonal branch and total occlusion of the  saphenous vein graft that is sequential to the ramus intermedius and the  third obtuse marginal coronary artery.  I attempted yesterday to open  the vein graft, but was unsuccessful.  I then placed a drug-eluting  stent in the third obtuse marginal branch.  During the diagnostic  catheterization yesterday, I felt that the bare-metal stent in the mid  right coronary artery had moderate-to-severe in-stent restenosis.  The  patient was brought back to the cath lab today for further evaluation of  the stented segment of the mid right coronary artery.   FINDINGS:  An angiogram of the mid right coronary artery was performed  today and  showed moderate in-stent restenosis with an estimation being  60-70% by angiogram.  We elected to perform an intravascular ultrasound  of the mid right coronary artery stented segment.  The ultrasound was  placed beyond the stented segment and was pulled back to the ostium of  the right coronary artery.  Heavy plaque burden was identified  throughout the entire vessel.  This is approximately a 5-mm vessel and  with a heavy plaque burden and the proximal portion has a 3.5 x 3.5-mm  lumen.  The stented segment in the mid right coronary artery has  moderate in-stent restenosis with circumferential plaque.  The most  severe area of stenosis has a minimum lumen area of 3.6-3.8 mm2.   Based on the findings of the intravascular ultrasound and the angiogram,  we do not believe that the area of restenosis inside the stent is flow  limiting.  I believe that the patient's presentation yesterday and his  symptoms have been secondary to the acute occlusion of the saphenous  vein graft to the ramus intermedius and the third obtuse marginal  coronary artery.  We were not able to stent the ramus  intermedius  yesterday secondary to ostial occlusion.  We were able to put a stent in  the third obtuse marginal branch of the circumflex.  I reviewed the  angiogram and the intravascular ultrasound pictures during the  catheterization with the patient's primary cardiologist, Dr. Charlies Constable.  We have elected today to perform no further intervention at  this time.   PLAN:  The patient will be taken back to the TCU and will be monitored  closely tonight.  We will continue medical management for now.      Verne Carrow, MD  Electronically Signed     CM/MEDQ  D:  09/27/2008  T:  09/28/2008  Job:  [id number]

## 2010-12-02 NOTE — Discharge Summary (Signed)
NAME:  [name], GRISSINGER NO.:  [id number]   MEDICAL RECORD NO.:  [id number]         PATIENT TYPE:  CINP   LOCATION:                               FACILITY:  [facility]   PHYSICIAN:  Evelene Croon, M.D.     DATE OF BIRTH:  May 11, 1976   DATE OF ADMISSION:  05/07/2008  DATE OF DISCHARGE:  05/11/2008                               DISCHARGE SUMMARY   HISTORY:  The patient is a 35 year old gentleman with a family history  of heart disease who presented last August with an acute inferior  myocardial infarction.  A cardiac catheterization revealed a high-grade  right coronary artery stenosis and he was treated with a drug-eluting  stent on March 17, 2008.  His result was excellent with residual  stenosis and TIMI-3 flow.  Catheterization also revealed a high-grade  proximal LAD and diagonal stenosis as well as a significant stenosis in  the intermediate and obtuse marginal branches.  Left ventricular  function was well preserved.  Since he had a drug-eluting stent, the  plan was to send him home on Plavix for about a month prior to  proceeding with coronary artery bypass grafting.  He was referred to Dr.  Laneta Simmers for surgery.  Dr. Laneta Simmers reviewed the patient and his studies and  agreed .with recommendations to proceed with surgery.  Of note, the  patient did initially take his Plavix, but his prescription ran out and  he did not have the financial resources to refill the prescription.   PAST MEDICAL HISTORY:  He has no known medical or surgical illnesses  other than previous to this recent diagnosis of coronary artery disease.   ALLERGIES:  CODEINE causes anaphylaxis.   Family history, social history, review of symptoms, and physical exam,  please see the previously dictated history and physical.   MEDICATIONS:  Prior to admission included the following:  1. Simvastatin 40 mg daily.  2. Aspirin 81 mg daily.  3. Plavix 75 mg daily, although it was noted he was not taking  this.  4. Metoprolol 50 mg q.a.m.  5. Ramipril 2.5 mg q.a.m.  6. Multivitamin 1 daily.  7. Nitroglycerin 0.4 mg sublingual p.r.n.   HOSPITAL COURSE:  The patient was admitted electively and on May 07, 2008, he was taken to the operating room where he underwent a coronary  artery bypass grafting x4.  The following grafts were placed.  1. Left internal mammary artery to the LAD.  2. A saphenous vein graft to diagonal branch.  3. Sequential saphenous vein graft to the intermediate and obtuse      marginal branches of the left circumflex coronary artery.  The      patient tolerated the procedure well, was taken to the surgical      intensive care unit in stable condition.  Postoperative hospital      course has been quite unremarkable.  All routine lines, monitors,      and drainage devices have been discontinued in the standard      fashion.  He has remained hemodynamically stable in sinus rhythm.   LABORATORY VALUES:  Quite stable.  Most recent hemoglobin and hematocrit  dated May 09, 2008, were 13 and 39 respectively.  Electrolytes, BUN,  and creatinine are all within normal limits.  His incisions are healing  well without evidence of infection.  He did have some left arm swelling  that prompted a venous duplex, which was negative.  He is tolerating  routine advancement activities using standard protocols.  His oxygen has  been weaned and he maintained good saturations on room air.  He has had  a moderate amount of volume overload, but has diuresed well.  He will  continue as an outpatient for a short term on Lasix.  Overall, his  status is felt to be quite stable for discharge on today's date May 11, 2008.   DISCHARGE MEDICATIONS:  Medications on discharge are as follows:  1. Simvastatin 40 mg daily.  2. Aspirin 325 mg daily.  3. Plavix 75 mg daily.  4. Multivitamin 1 daily.  5. Toprol-XL 25 mg daily.  6. Lasix 40 mg daily for 5 days.  7. K-Dur 20 mEq daily for 5  days for pain.  8. Dilaudid 2 mg one to two every 4-6 hours as needed.   DISCHARGE INSTRUCTIONS:  The patient received written instructions in  regard to medications, activity, diet, wound care, and followup.   FOLLOWUP:  Include Dr. Evelene Croon on June 05, 2008.  He is also  instructed to arrange an appointment to see Dr. Charlies Constable in 2 weeks.   FINAL DIAGNOSES:  1. Severe coronary artery disease, now status post surgical re-      vascularization.  2. History of right coronary drug-eluting stent.  3. History of inferior myocardial infarction.      Rowe Clack, P.A.-C.      Evelene Croon, M.D.  Electronically Signed    WEG/MEDQ  D:  05/11/2008  T:  05/11/2008  Job:  [id number]   cc:   Everardo Beals. Juanda Chance, MD, [facility]

## 2010-12-02 NOTE — Assessment & Plan Note (Signed)
OFFICE VISIT   [name]  DOB:  1975-10-31                                        May 15, 2008  CHART #:  [id number]   The patient returned today for followup to have his sternotomy examine.  He underwent coronary artery bypass surgery x4 on 05/07/2008.  He was  discharged on 05/11/2008.  He said that since going home, he has been  more sore and has also noticed some popping sensation and a little bit  of movement in the sternum.  He said that he has been taking his  Dilaudid pain medicine, but has not been working there as well for pain  and he actually took 2 mg Dilaudid pills last night without any relief  of his pain.  He said that he has not been doing any unusual activity  other than walking around.  He denies any fever or chills.  He has had  no cough.   PHYSICAL EXAMINATION:  VITAL SIGNS:  Blood pressure is 140/102 and his  pulse is 99 and regular.  Respiratory rate is 18 and unlabored.  Oxygen  saturation on room air is 99%.  GENERAL:  He looks well.  CARDIAC:  Regular rate and rhythm with normal heart sounds.  LUNGS:  Clear.  The chest incision is healing well and sternum is  stable.  EXTREMITIES:  His leg incision is healing well and there is no  peripheral edema.   Followup chest x-ray today shows clear lung fields and no pleural  effusions.  The sternal wires are all intact.   MEDICATIONS:  Plavix 75 mg daily, aspirin 81 mg daily, Zocor 40 mg  daily, Toprol-XL 25 mg daily, and Dilaudid p.r.n. pain.   IMPRESSION:  Overall, I think the patient is making a good recovery  following surgery.  I told him that the sensations that he is having of  slight sternal shifting and popping sensation are very common after  sternotomy especially with his large upper body.  He has double sternal  wires, which should be more than sufficient to hold the sternum  together.  I asked him not to do any strenuous upper body exertion or  lifting more  than 10 pounds for 3 months after surgery.  I would expect  his pain to continue to improve and his popping sensation to resolve.  I  suspect, the edge of the sternum maybe moving against to each other  slightly.  I did write him today for Darvocet-N 100 one p.o. q.4 h.  p.r.n. for pain, #40 with no refills.  I asked him to take this along  with Motrin as directed on the bottle to see if this will help his pain  better than the Dilaudid that he was on.  He has a followup appointment  to see me in about 3 weeks.   Evelene Croon, M.D.  Electronically Signed   BB/MEDQ  D:  05/15/2008  T:  05/16/2008  Job:  [id number]

## 2010-12-02 NOTE — Assessment & Plan Note (Signed)
[facility]                            CARDIOLOGY OFFICE NOTE   NAME:[name]                     MRN:          [id number]  DATE:04/03/2008                            DOB:          [birth year]    PRIMARY CARE PHYSICIAN:  None.   CLINICAL HISTORY:  [name] is 35 years old who returned for  management of his coronary heart disease after his recent  hospitalization for myocardial infarction.   [name] had no prior history of heart disease and was hospitalized on  March 16, 2008, with chest pain and EKG change and acute diaphragmatic  wall infarction.  He came from [facility].  He  underwent bare-metal stent for a 99% right coronary occlusion, but had  residual 90% disease in the LAD and 80% disease in the ramus and 90%  disease in the posterolateral branch of the circumflex artery.  We felt  bypass surgery was best long-term option.  He was seen in consultation  with Dr. Laneta Simmers.  We felt waiting 6 weeks was best to make sure that his  stent had healed after intervention.  The plan would be that he would  not need to have a right coronary bypass and that we will be able to  stop Plavix prior to surgery without a high risk of stent thrombosis.   He has done fairly well since discharge.  He has had a couple episodes  of chest pain over the last couple days, which had been precipitated by  stress related to custody proceedings for his 2 sons.   His past medical history is significant for tobacco use and  hyperlipidemia.   His current medications include Plavix, aspirin, simvastatin, Toprol 50  mg daily, and ramipril 2.5 mg daily.   On examination, the blood pressure was 110/80 and the pulse 72 and  regular.  There was no venous distention.  The carotid pulses were full  without bruits.  Chest was clear.  Cardiac rhythm was regular.  He had  no murmurs or gallops.  The abdomen was soft without organomegaly.  Peripheral pulses were full.  There was no peripheral edema.   An electrocardiogram showed inferolateral T-wave changes and inferior Q  waves indicative of his recent inferior infarction.   IMPRESSION:  1. Coronary artery disease status post recent diaphragmatic wall      infarction seen with a bare-metal stent to the right coronary      artery.  2. Residual three-vessel coronary disease.  3. Good left ventricular function.  4. Hyperlipidemia.  5. Cigarette use.   RECOMMENDATIONS:  I think [name] is doing reasonably well following  his infarct.  He has applied for the Assist Program for Plavix and is  currently buying it on his own.  He is scheduled to see Dr. Laneta Simmers on  April 24, 2008, with plan for surgery sometime shortly after that.  We  will plan to see him back in followup when he comes in for his bypass  surgery.  We will continue his current medications.     Bruce Elvera Lennox Juanda Chance,  MD, [facility]  Electronically Signed    BRB/MedQ  DD: 04/03/2008  DT: 04/04/2008  Job #: [id number]
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
## 2010-12-02 NOTE — Assessment & Plan Note (Signed)
OFFICE VISIT   [name], [name]  DOB:  [birth year]                                        May 01, 2008  CHART #:  [id number]   The patient returns to see me for followup concerning severe three-  vessel coronary artery disease.  He is status post acute inferior MI and  acute PCI and stenting of the right coronary artery with a drug-eluting  stent on March 17, 2008.  His catheterization also showed significant  stenosis in the LAD and diagonal as well as the intermediate and obtuse  marginal branches.  He had an excellent revascularization of his right  coronary artery with 0 degree stenosis.  His peak troponin was 42 and  his peak CPK was about 2000.  I saw him in consultation on March 19, 2008, and would refer to that consultation for his full history and  physical.  He has done well since discharge on Plavix.  The original  plan when I saw him in the hospital was to send him home on Plavix for  about 4 weeks and then to perform coronary artery bypass surgery.  He  said they took the Plavix until about 1 week ago when he ran out and did  not have any money to buy.  He denies any chest pain or pressure.  He  has had some exertional shortness of breath and fatigue.   PHYSICAL EXAMINATION:  VITAL SIGNS:  Blood pressure 132/94, his pulse is  102 and regular.  Respiratory rate 18, unlabored.  Oxygen saturation on  room air is 95%.  GENERAL:  He looks well.  CARDIAC:  Regular rate and rhythm with normal heart sounds.  LUNGS:  Clear.  EXTREMITIES:  There is no peripheral edema.   MEDICATIONS:  His medications are Lopressor 50 mg daily, Plavix 75 mg  daily which he stopped, Altace 2.5 mg daily, and Zocor 40 mg daily.  He  is also taking aspirin 182 mg per day.   IMPRESSION:  The patient has severe residual two-vessel coronary artery  disease, status post percutaneous coronary intervention and stenting of  a subtotally occluded right coronary artery.  He  now presents for  further consideration of revascularization with coronary artery bypass  surgery.  I think that is the best option for him given the severe three-  vessel coronary artery disease as well as his difficulty with medical  noncompliance due to inability to pay for his medicines.  He has now  been off Plavix for at least a week on his own and I am still somewhat  concerned about his drug-eluting stent in the right coronary artery.  We  will schedule surgery for Monday, May 07, 2008.  I discussed  coronary artery bypass surgery with him including alternatives,  benefits, and risks including but not limited to bleeding, blood  transfusion, infection, stroke, myocardial infarction, graft failure,  stent thrombosis, and death.  He understands and agrees to proceed with  surgery.   Evelene Croon, M.D.  Electronically Signed   BB/MEDQ  D:  05/01/2008  T:  05/02/2008  Job:  [id number]

## 2010-12-02 NOTE — Cardiovascular Report (Signed)
NAME:  [name], [name]              ACCOUNT NO.:  [id number]   MEDICAL RECORD NO.:  [id number]          PATIENT TYPE:  INP   LOCATION:  2910                         FACILITY:  [facility]   PHYSICIAN:  Everardo Beals. Juanda Chance, MD, FACCDATE OF BIRTH:  Oct 26, 1975   DATE OF PROCEDURE:  03/17/2008  DATE OF DISCHARGE:                            CARDIAC CATHETERIZATION   CLINICAL HISTORY:  Mr. Grater is 35 years old, has no prior history of  known heart disease.  He does not have a primary care physician.  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  He is a heavy smoker.  Tonight  at 10:00 p.m., he developed chest pain and went to [facility] Emergency  Room where his ECG showed an acute diaphragmatic wall infarction.  He  was seen by Dr. Colon Branch and transferred promptly to [facility] for  catheterization and probable intervention via Care Link.   PROCEDURE:  The procedure was performed by the right femoral arteries,  arterial sheath, and 6-French preformed coronary catheters.  A front  wall arterial puncture was performed and Omnipaque contrast was used.  We first performed a diagnostic left coronary angiography with a JR-4 6-  [place] guiding catheter with side holes.  The patient was given  antiemetic bolus infusion, was given 600 mg of Plavix, 4 chewable  aspirin, and 20 mg of Pepcid.  We identified a subtotally occluded  lesion in the mid-right coronary artery.  We crossed the lesion with a  Prowater wire without difficulty.  We predilated with a 2.5 x 20-mm apex  balloon performing one inflation of 8 atmospheres for 30 seconds.  This  resulted in bradycardia, hypotension, and followed by an accelerated  ventricular rhythm.  The hypotension required IV fluids and dopamine.  After several balloon inflations, we then deployed a 3.5 x 32-mm Liberty  stent deployments with one inflation of 11 atmospheres for 30 seconds.  We postdilated with a 3.5 x 25-mm Julian Voyager performing one inflation up  to 18  atmospheres for 30 seconds.   Final diagnostics were then performed through the guiding catheter.  The  patient tolerated the procedure well and left the laboratory in  satisfactory condition.   RESULTS:  The left main coronary was free of disease.   Left anterior descending artery:  The left anterior descending artery  gave rise to a diagonal branch and a septal perforator.  There was a  segmental area of 70% narrowing in the proximal LAD crossing the  diagonal branch with a focal area of 90% stenosis just after the first  diagonal branch.  There was 40% narrowing in the mid LAD.   The circumflex artery:  The circumflex artery gave rise to a moderately  large ramus branch, a small marginal branch, and posterolateral branch.  There was 80% stenosis in the ramus branch.  There were 90% and 70%  stenoses in the posterolateral branch.   The right coronary artery:  The right coronary was a large vessel gave  rise to a right ventricular branch, posterior ascending branch, and a  moderate enlarged posterolateral branch.  There was  99% stenosis in the  mid-to-distal vessel with TIMI 2 flow distally.  The posterior  descending branch appeared to be occluded near its origin and appeared  to fell via collaterals from the posterolateral branch.   Left ventriculogram:  The left ventriculogram was performed on RAO  projection showed hypokinesis of the inferobasal wall.  The rest of the  wall motion was normal, but not hyperdynamic.  The estimated ejection  fraction was 50%.   Following stenting of the lesion in the mid-to-distal right coronary  stenosis improved from 99% to 0% and the flow improved from TIMI 2 to  TIMI 3 flow.   The patient had the onset of chest pain at 2200 on March 16, 2008, and  arrived at [facility] Emergency Room at 23:40 on March 16, 2008.  He  arrived at [facility] Lab by a care link at 0100 and the first  balloon inflation was at 0134.  This gave a  door-to-balloon time from  [facility] of 1 hour and 54 minutes and a reperfusion time of 3  hours and 34 minutes.   CONCLUSION:  1. Acute diaphragmatic wall infarction with 99% stenosis in the mid      right coronary artery, 79% proximal stenosis in the left anterior      descending artery, 80% stenosis in the ramus branch of the      circumflex artery with 90% stenosis in the posterolateral branch of      circumflex artery, and inferobasal wall hypokinesis with an      estimated ejection fraction of 50%.  2. Successful PCI of the lesion in the mid-right coronary artery using      a Liberte bare-metal stent with improvement center narrowing from      99% to 0% and improvement in flow from TIMI 2 to TIMI 3 flow.   DISPOSITION:  The patient returned to post angio room for further  observation.  The patient will need revascularization of his left  coronary system as his lesions are quite tight.  I reviewed with my  colleagues whether this should best be treated percutaneously or with  surgery.  Certainly, the lesion in the LAD, ramus, and posterolateral  branch are treatable percutaneously but concerned about the patient's  long-term compliance with the Plavix therapy, which should be important  for a good result with percutaneous coronary intervention.      Bruce Elvera Lennox Juanda Chance, MD, [facility]  Electronically Signed     BRB/MEDQ  D:  03/17/2008  T:  03/17/2008  Job:  [id number]

## 2010-12-02 NOTE — Cardiovascular Report (Signed)
NAME:  [name], [name] NO.:  [id number]   MEDICAL RECORD NO.:  [id number]          PATIENT TYPE:  INP   LOCATION:  2927                         FACILITY:  [facility]   PHYSICIAN:  Verne Carrow, MDDATE OF BIRTH:  16-Aug-1975   DATE OF PROCEDURE:  09/26/2008  DATE OF DISCHARGE:  09/26/2008                            CARDIAC CATHETERIZATION   PRIMARY CARDIOLOGIST:  Everardo Beals. Juanda Chance, MD, [facility]   PROCEDURES PERFORMED:  1. Left heart catheterization  2. Selective coronary angiography.  3. Left ventricular angiogram.  4. Saphenous vein graft angiography.  5. Left internal mammary artery graft angiography.  6. Percutaneous transluminal coronary angioplasty of the saphenous      vein graft that is sequential to the ramus intermedius and the      third obtuse marginal - unsuccessful.  7. Percutaneous coronary intervention with placement of a drug-eluting      stent extending from the mid circumflex into the third obtuse      marginal coronary artery.  8. Placement of an Angio-Seal femoral artery closure device.   OPERATOR:  Verne Carrow, MD   INDICATIONS:  Unstable angina in a 35 year old patient, who had a bare-  metal stent placed in his right coronary artery in August 2008 followed  by a four-vessel CABG procedure in October 2008.   DETAILS OF PROCEDURE:  The patient was brought to the inpatient cardiac  catheterization laboratory after signing informed consent for the  procedure.  The right groin was prepped and draped in sterile fashion.  A 1% lidocaine was used for local anesthesia.  A 5-French sheath was  inserted into the right femoral artery without difficulty.  Standard  diagnostic catheters were used to perform selective angiography of the  left coronary artery system, the right coronary artery.  The saphenous  vein grafts were selectively engaged with an LCB catheter.  A left  internal mammary artery catheter was used to selectively engage the  left  internal mammary artery graft to the mid LAD.  A 5-French pigtail  catheter was used to perform a left ventricular angiogram.  There was no  significant pressure gradient across the aortic valve.   At this point of procedure, I reviewed the films with my interventional  colleagues and elected to proceed to an attempted intervention of the  occluded saphenous vein graft that appeared to go to the ramus  intermedius and the third obtuse marginal coronary artery.  I used an  LCB guide to selectively engage the saphenous vein graft.  A Cougar  intracoronary wire was passed down into the saphenous vein graft and  most likely into the ramus intermedius branch.  A 2.0 x 20-mm balloon  was then passed down into the saphenous vein graft.  Three inflations  were made extending from the ostium down into the midportion of the vein  graft.  Flow was not restored with these balloon inflations.  I felt  that this vein graft was most likely filled with thrombus and I elected  not to proceed further on this vessel.   The saphenous vein graft that was sequential to the obtuse  marginal and  the ramus intermedius is most likely the acute vessel.  The ramus  intermedius was found on the diagnostic cath to have a 100% proximal  occlusion and had no flow from the occluded vein graft.  The mid  circumflex gave rise to a large posterolateral branch that had a 90%  stenosis and had been supplied by that same saphenous vein graft.  I  felt that we would get the patient benefited by opening this branch.  I  then used an XB 3.5 guide to engage the left main coronary artery.  A  Cougar intracoronary wire was passed down the circumflex artery.  However, I was unable to get beyond the tightest area of stenosis in the  posterolateral branch.  I then used a traverse wire and was able to get  beyond the area of tightest stenosis.  A 2.0 x 20-mm balloon was used  for predilatation of lesion.  I then had some  difficulty with guide and  had to change to a different guide.  A CLS 4.0 guiding catheter was then  used to engage the left main coronary artery.  A traverse wire was then  passed down the length of the vessel without difficulty.  XIENCE 2.5 x  28-mm drug-eluting stent was then deployed extending from the mid  circumflex down into the midportion of the large bifurcating  posterolateral branch.  There was an excellent angiographic result.   A right femoral angiogram was performed and an Angio-Seal femoral artery  closure device was placed in the right femoral artery.  The patient was  taken to the holding area in stable condition.   ANGIOGRAPHIC FINDINGS:  1. The left main coronary artery had no significant disease.  2. The left anterior descending is known to be a large vessel that      coursed to the apex.  There is currently a proximal 70% stenosis      just at the take off of the large first diagonal.  There is 100%      occlusion in the midportion of the LAD with competitive flow from      the left internal mammary artery graft.  The mid distal LAD fills      from the left internal mammary artery graft.  First diagonal is a      large vessel with serial 50% lesions.  3. The circumflex artery has serial 30% lesions in the proximal      portion.  The ramus intermedius that was known to be a moderate      size branch has a 100% ostial occlusion.  The first and second      obtuse marginal branches are small in caliber.  The third obtuse      marginal branch, which goes to the posterolateral wall has a tight      90-95% stenosis in the proximal portion of this branch.  There is      also a 70% lesion at the insertion of the saphenous vein graft.      There was TIMI-3 flow down the length of the posterolateral branch.  4. The right coronary artery is a large dominant vessel that has 30%      plaque in the proximal portion.  The stented portion of the mid      vessel has 70% in-stent  restenosis.  There is plaque disease noted      in the distal right coronary artery as well as the posterior  descending artery.  5. The saphenous vein graft to the first diagonal artery is 100%      occluded at the ostium.  6. The saphenous vein graft that is sequential to the ramus      intermedius and the third obtuse marginal coronary artery has a      100% functional occlusion with TIMI-0 flow.  7. The left internal mammary artery graft to the mid LAD is patent.  8. Left ventricular angiogram performed in the RAO projection shows      overall preserved left ventricular function with an ejection      fraction of 50%.  The anterolateral and inferoapical walls are      hypokinetic.   HEMODYNAMIC DATA:  Central aortic pressure 113/82.  Left ventricular  pressure 106/4.  End-diastolic pressure 11.   IMPRESSION:  1. Triple-vessel native coronary artery disease (severe).  2. Status post four-vessel coronary artery bypass graft procedure with      1 out of 4 patent bypass grafts.  The acute event is likely      secondary to obstruction of the saphenous vein graft to the ramus      intermedius and third obtuse marginal coronary artery.  3. Overall preserved left ventricular systolic function with segmental      wall motion abnormalities noted.  4. Successful placement of a drug-eluting stent extending from the mid      circumflex artery down into the large third obtuse marginal      coronary artery.   PLAN:  This patient has severe native vessel disease especially given  his age.  He has continued to smoke after his bypass procedure.  His  left internal mammary artery to the mid LAD is patent.  The saphenous  vein graft that supplies the ramus intermedius and the third obtuse  marginal coronary artery is occluded and is likely the acute event.  I  attempted to open this vessel, but was unable to do so.  I have reviewed  his films with my interventional colleagues Dr. Excell Seltzer and Dr.  Riley Kill,  who agreed that the best revascularization option would be to put a drug-  eluting stent in his mid circumflex extending into the third obtuse  marginal branch.  No intervention could be performed on the totally  occluded ramus intermedius branch.  This will provide him with an  optimal revascularization result.  We will need to bring him back for a  staged intervention of the mid right coronary artery in-stent  restenosis.  Currently we would attempt to control his pain with a  nitroglycerin drip and morphine as needed.  We will also continue  aspirin, Plavix, a beta-blocker, and a statin.      Verne Carrow, MD  Electronically Signed     CM/MEDQ  D:  09/26/2008  T:  09/27/2008  Job:  [id number]   cc:   Everardo Beals. Juanda Chance, MD, [facility]

## 2010-12-05 NOTE — Discharge Summary (Signed)
NAME:  [name], [name] NO.:  [id number]   MEDICAL RECORD NO.:  [id number]          PATIENT TYPE:  IPS   LOCATION:  0506                          FACILITY:  [facility]   PHYSICIAN:  Anselm Jungling, MD  DATE OF BIRTH:  [birth year]   DATE OF ADMISSION:  03/31/2007  DATE OF DISCHARGE:  04/04/2007                               DISCHARGE SUMMARY   IDENTIFYING DATA/REASON FOR ADMISSION:  The patient is a 35 year old  separated white male with three children, working, who was admitted at  his request for alcohol dependence and detoxification, in the context of  depression and marital crisis.  He had started drinking very heavily  five days prior to admission.  Please refer to the admission note for  further details pertaining to the symptoms, circumstances and history  that led to his hospitalization.   INITIAL DIAGNOSTIC IMPRESSION:  He was given an initial AXIS I diagnoses  of alcohol dependence, alcohol withdrawal, substance-induced mood  disorder, and marital problem.   MEDICAL/LABORATORY:  The patient was medically and physically assessed  by the psychiatric nurse practitioner.  He was in generally good health,  but had symptoms of gastritis.  He was given Protonix 40 mg daily, and  Carafate 1 gram four times daily.  During the course of his stay, his  gastric symptoms improved and he was able to eat more satisfactorily.  There were no acute medical issues.   HOSPITAL COURSE:  The patient was admitted to the adult inpatient  psychiatric service.  He presented as a well-nourished, well-developed  male who was alert, fully oriented, without any signs or symptoms of  psychosis, thought disorder or delirium.  He was very depressed, but  open and pleasant.  He denied any suicidal ideation.  He verbalized a  strong desire for help.  He stated that he was done with drinking  alcohol.   The patient was placed on a Librium withdrawal protocol.  He  participated in various  therapeutic groups and activities geared towards  12-step recovery.   By the third day, the patient was stating I feel much better.  He was  sleeping well with Librium, and relatively nontremulous.  He was  participating well in therapeutic groups.   On the fourth hospital day, there was a family session involving the  patient and his girlfriend.  Discharge planning was discussed at length  including follow-up with AA meetings, and counseling through [facility].  He indicated that he no longer felt suicidal.  They were  given information on suicide prevention and the crisis hotline.   On the following day, the patient had completed his Librium  detoxification protocol, and appeared to be stable in all respects.  He  agreed with following aftercare plan.   AFTERCARE:  The patient was to follow up with her Daine Gip at  [facility] with an appointment on April 14, 2007.  He was encouraged to attend Alcoholics Anonymous meetings  frequently.   DISCHARGE MEDICATIONS:  He was continued on Protonix 40 mg daily and  Carafate 1 gram  four times daily.   DISCHARGE DIAGNOSES:  AXIS I:  Alcohol dependence, early remission.  AXIS II:  Deferred.  AXIS III:  Alcoholic gastritis, resolving.  AXIS IV:  Stressors:  Severe.  AXIS V:  GAF on discharge 55.      Anselm Jungling, MD  Electronically Signed     SPB/MEDQ  D:  04/05/2007  T:  04/05/2007  Job:  [phone]

## 2010-12-22 ENCOUNTER — Telehealth: Payer: Self-pay | Admitting: Cardiovascular Disease

## 2010-12-22 MED ORDER — SIMVASTATIN 80 MG PO TABS: 80.0000 mg | ORAL_TABLET | Freq: Every day | ORAL | Status: DC

## 2010-12-22 NOTE — Telephone Encounter (Signed)
PT NEEDS TO REORDER PLAVIX 75 MG UNDER PT ASSISTANCE PROGRAM

## 2010-12-22 NOTE — Telephone Encounter (Signed)
SIMVASTATIN 80 MG [facility] [place] [phone]

## 2010-12-23 NOTE — Telephone Encounter (Signed)
Explained to the patient that Plavix is now generic and we are no longer able to provide assistance through [facility]. He was going to contact his pharmacy and Alver Fisher for more information. He will let me know if he needs samples or will not be able to afford it.

## 2010-12-29 ENCOUNTER — Other Ambulatory Visit: Payer: Self-pay | Admitting: *Deleted

## 2010-12-31 ENCOUNTER — Other Ambulatory Visit: Payer: Self-pay | Admitting: *Deleted

## 2010-12-31 MED ORDER — SIMVASTATIN 80 MG PO TABS: 80.0000 mg | ORAL_TABLET | Freq: Every day | ORAL | Status: DC

## 2011-01-02 ENCOUNTER — Emergency Department (HOSPITAL_COMMUNITY)
Admission: EM | Admit: 2011-01-02 | Discharge: 2011-01-02 | Disposition: A | Discharge status: Home / Self Care | Payer: Worker's Compensation | Attending: Emergency Medicine | Admitting: Emergency Medicine

## 2011-01-02 ENCOUNTER — Encounter (HOSPITAL_COMMUNITY): Payer: Self-pay | Admitting: Radiology

## 2011-01-02 ENCOUNTER — Emergency Department (HOSPITAL_COMMUNITY): Payer: Worker's Compensation

## 2011-01-02 DIAGNOSIS — S8010XA Contusion of unspecified lower leg, initial encounter: Secondary | ICD-10-CM | POA: Insufficient documentation

## 2011-01-02 DIAGNOSIS — I2510 Atherosclerotic heart disease of native coronary artery without angina pectoris: Secondary | ICD-10-CM | POA: Insufficient documentation

## 2011-01-02 DIAGNOSIS — M79609 Pain in unspecified limb: Secondary | ICD-10-CM | POA: Insufficient documentation

## 2011-01-02 DIAGNOSIS — W240XXA Contact with lifting devices, not elsewhere classified, initial encounter: Secondary | ICD-10-CM | POA: Insufficient documentation

## 2011-01-02 DIAGNOSIS — M7989 Other specified soft tissue disorders: Secondary | ICD-10-CM | POA: Insufficient documentation

## 2011-01-02 DIAGNOSIS — E7800 Pure hypercholesterolemia, unspecified: Secondary | ICD-10-CM | POA: Insufficient documentation

## 2011-01-02 DIAGNOSIS — Z79899 Other long term (current) drug therapy: Secondary | ICD-10-CM | POA: Insufficient documentation

## 2011-01-02 DIAGNOSIS — Z7902 Long term (current) use of antithrombotics/antiplatelets: Secondary | ICD-10-CM | POA: Insufficient documentation

## 2011-01-02 DIAGNOSIS — S301XXA Contusion of abdominal wall, initial encounter: Secondary | ICD-10-CM | POA: Insufficient documentation

## 2011-01-02 DIAGNOSIS — I10 Essential (primary) hypertension: Secondary | ICD-10-CM | POA: Insufficient documentation

## 2011-01-02 DIAGNOSIS — R1012 Left upper quadrant pain: Secondary | ICD-10-CM | POA: Insufficient documentation

## 2011-01-02 DIAGNOSIS — Z951 Presence of aortocoronary bypass graft: Secondary | ICD-10-CM | POA: Insufficient documentation

## 2011-01-02 DIAGNOSIS — I252 Old myocardial infarction: Secondary | ICD-10-CM | POA: Insufficient documentation

## 2011-01-02 DIAGNOSIS — Z7982 Long term (current) use of aspirin: Secondary | ICD-10-CM | POA: Insufficient documentation

## 2011-01-02 HISTORY — DX: Presence of aortocoronary bypass graft: Z95.1

## 2011-01-02 HISTORY — DX: Essential (primary) hypertension: I10

## 2011-01-02 LAB — DIFFERENTIAL
Basophils Absolute: 0 10*3/uL (ref 0.0–0.1)
Lymphocytes Relative: 33 % (ref 12–46)
Monocytes Absolute: 0.7 10*3/uL (ref 0.1–1.0)
Neutro Abs: 5.1 10*3/uL (ref 1.7–7.7)
Neutrophils Relative %: 56 % (ref 43–77)

## 2011-01-02 LAB — URINALYSIS, ROUTINE W REFLEX MICROSCOPIC
Glucose, UA: NEGATIVE mg/dL
Hgb urine dipstick: NEGATIVE
Leukocytes, UA: NEGATIVE
Specific Gravity, Urine: 1.028 (ref 1.005–1.030)
pH: 5.5 (ref 5.0–8.0)

## 2011-01-02 LAB — COMPREHENSIVE METABOLIC PANEL
ALT: 19 U/L (ref 0–53)
AST: 17 U/L (ref 0–37)
Alkaline Phosphatase: 61 U/L (ref 39–117)
CO2: 23 mEq/L (ref 19–32)
GFR calc Af Amer: >60 mL/min (ref >60)
GFR calc non Af Amer: >60 mL/min (ref >60)
Glucose, Bld: 133 mg/dL — ABNORMAL HIGH (ref 70–99)
Potassium: 3.5 mEq/L (ref 3.5–5.1)
Sodium: 137 mEq/L (ref 135–145)

## 2011-01-02 LAB — CBC
HCT: 43 % (ref 39.0–52.0)
Hemoglobin: 15.5 g/dL (ref 13.0–17.0)
RBC: 4.73 MIL/uL (ref 4.22–5.81)
WBC: 9.1 10*3/uL (ref 4.0–10.5)

## 2011-01-02 MED ORDER — IOHEXOL 300 MG/ML  SOLN
100.0000 mL | Freq: Once | INTRAMUSCULAR | Status: AC | PRN
  Administered 2011-01-02: 100 mL via INTRAVENOUS

## 2011-01-09 ENCOUNTER — Emergency Department (HOSPITAL_COMMUNITY)
Admission: EM | Admit: 2011-01-09 | Discharge: 2011-01-10 | Disposition: A | Discharge status: Home / Self Care | Payer: Worker's Compensation | Attending: Emergency Medicine | Admitting: Emergency Medicine

## 2011-01-09 DIAGNOSIS — I252 Old myocardial infarction: Secondary | ICD-10-CM | POA: Insufficient documentation

## 2011-01-09 DIAGNOSIS — Z7982 Long term (current) use of aspirin: Secondary | ICD-10-CM | POA: Insufficient documentation

## 2011-01-09 DIAGNOSIS — M7989 Other specified soft tissue disorders: Secondary | ICD-10-CM | POA: Insufficient documentation

## 2011-01-09 DIAGNOSIS — I2510 Atherosclerotic heart disease of native coronary artery without angina pectoris: Secondary | ICD-10-CM | POA: Insufficient documentation

## 2011-01-09 DIAGNOSIS — Z79899 Other long term (current) drug therapy: Secondary | ICD-10-CM | POA: Insufficient documentation

## 2011-01-09 DIAGNOSIS — I803 Phlebitis and thrombophlebitis of lower extremities, unspecified: Secondary | ICD-10-CM | POA: Insufficient documentation

## 2011-01-09 DIAGNOSIS — E7800 Pure hypercholesterolemia, unspecified: Secondary | ICD-10-CM | POA: Insufficient documentation

## 2011-01-09 DIAGNOSIS — R209 Unspecified disturbances of skin sensation: Secondary | ICD-10-CM | POA: Insufficient documentation

## 2011-01-09 DIAGNOSIS — I10 Essential (primary) hypertension: Secondary | ICD-10-CM | POA: Insufficient documentation

## 2011-01-10 ENCOUNTER — Emergency Department (HOSPITAL_COMMUNITY): Payer: Worker's Compensation

## 2011-04-06 ENCOUNTER — Encounter: Payer: Self-pay | Admitting: Cardiovascular Disease

## 2011-04-07 ENCOUNTER — Ambulatory Visit: Payer: Self-pay | Admitting: Cardiovascular Disease

## 2011-04-08 LAB — POCT CARDIAC MARKERS
Operator id: 106841
Troponin i, poc: <0.05

## 2011-04-08 LAB — DIFFERENTIAL
Basophils Absolute: 0
Eosinophils Absolute: 0.4
Eosinophils Relative: 4
Lymphs Abs: 2.6
Neutrophils Relative %: 59

## 2011-04-08 LAB — D-DIMER, QUANTITATIVE: D-Dimer, Quant: 0.22

## 2011-04-08 LAB — CBC
HCT: 47.2
MCV: 92.1
Platelets: 286
RDW: 15
WBC: 8.6

## 2011-04-08 LAB — BASIC METABOLIC PANEL
BUN: 12
Chloride: 105
Creatinine, Ser: 1.05
GFR calc non Af Amer: >60
Glucose, Bld: 124 — ABNORMAL HIGH

## 2011-04-14 LAB — DIFFERENTIAL
Eosinophils Absolute: 0.1
Lymphs Abs: 2.4
Monocytes Relative: 4
Neutro Abs: 4.8
Neutrophils Relative %: 62

## 2011-04-14 LAB — COMPREHENSIVE METABOLIC PANEL
ALT: 24
BUN: 8
CO2: 23
Calcium: 9.3
Creatinine, Ser: 1.02
GFR calc non Af Amer: >60
Glucose, Bld: 116 — ABNORMAL HIGH
Total Protein: 8

## 2011-04-14 LAB — CBC
HCT: 50.6
Hemoglobin: 17.8 — ABNORMAL HIGH
MCHC: 35.1
MCV: 92.9
RBC: 5.45
RDW: 13.6

## 2011-04-21 LAB — CREATININE, SERUM
Creatinine, Ser: 0.81
Creatinine, Ser: 0.84
GFR calc Af Amer: >60
GFR calc non Af Amer: >60

## 2011-04-21 LAB — CBC
HCT: 39
HCT: 40.6
HCT: 40.7
HCT: 49.3
Hemoglobin: 12.7 — ABNORMAL LOW
Hemoglobin: 13.6
Hemoglobin: 13.8
MCHC: 33.5
MCHC: 33.6
MCHC: 33.8
MCHC: 34.1
MCV: 94.5
MCV: 94.6
MCV: 95.1
Platelets: 177
Platelets: 178
Platelets: 246
RBC: 3.98 — ABNORMAL LOW
RBC: 4.28
RBC: 4.31
RBC: 5.23
RDW: 13.4
RDW: 13.6
WBC: 12.9 — ABNORMAL HIGH
WBC: 6.9
WBC: 8.6

## 2011-04-21 LAB — POCT I-STAT 3, ART BLOOD GAS (G3+)
Acid-Base Excess: 2
Acid-base deficit: 2
Bicarbonate: 23.6
Bicarbonate: 25.1 — ABNORMAL HIGH
Bicarbonate: 25.1 — ABNORMAL HIGH
O2 Saturation: 94
O2 Saturation: 97
Patient temperature: 37.3
TCO2: 25
TCO2: 25
TCO2: 27
TCO2: 28
pCO2 arterial: 54.1 — ABNORMAL HIGH
pH, Arterial: 7.274 — ABNORMAL LOW
pH, Arterial: 7.274 — ABNORMAL LOW
pH, Arterial: 7.343 — ABNORMAL LOW
pH, Arterial: 7.357
pO2, Arterial: 397 — ABNORMAL HIGH
pO2, Arterial: 62 — ABNORMAL LOW
pO2, Arterial: 79 — ABNORMAL LOW
pO2, Arterial: 81
pO2, Arterial: 94

## 2011-04-21 LAB — URINALYSIS, ROUTINE W REFLEX MICROSCOPIC
Glucose, UA: NEGATIVE
pH: 6

## 2011-04-21 LAB — POCT I-STAT 4, (NA,K, GLUC, HGB,HCT)
Glucose, Bld: 113 — ABNORMAL HIGH
Glucose, Bld: 118 — ABNORMAL HIGH
Glucose, Bld: 119 — ABNORMAL HIGH
Glucose, Bld: 134 — ABNORMAL HIGH
HCT: 32 — ABNORMAL LOW
HCT: 37 — ABNORMAL LOW
HCT: 41
Hemoglobin: 10.9 — ABNORMAL LOW
Hemoglobin: 12.6 — ABNORMAL LOW
Hemoglobin: 15
Potassium: 4.2
Sodium: 131 — ABNORMAL LOW
Sodium: 136

## 2011-04-21 LAB — POCT I-STAT 3, VENOUS BLOOD GAS (G3P V)
Bicarbonate: 25.3 — ABNORMAL HIGH
TCO2: 27
pCO2, Ven: 55.1 — ABNORMAL HIGH
pH, Ven: 7.271

## 2011-04-21 LAB — BLOOD GAS, ARTERIAL
Bicarbonate: 23.7
FIO2: 0.21
Patient temperature: 98.6
TCO2: 24.9
pCO2 arterial: 37.6
pH, Arterial: 7.416

## 2011-04-21 LAB — BASIC METABOLIC PANEL
BUN: 7
CO2: 25
Calcium: 8 — ABNORMAL LOW
Chloride: 104
Creatinine, Ser: 0.84
GFR calc Af Amer: >60
GFR calc non Af Amer: >60
Glucose, Bld: 105 — ABNORMAL HIGH
Potassium: 3.7
Sodium: 136

## 2011-04-21 LAB — POCT I-STAT, CHEM 8
BUN: 9
Calcium, Ion: 1.16
Chloride: 106
Creatinine, Ser: 1
Glucose, Bld: 117 — ABNORMAL HIGH
HCT: 40
Hemoglobin: 12.6 — ABNORMAL LOW
Hemoglobin: 13.6
Potassium: 4.3
Sodium: 138
TCO2: 22

## 2011-04-21 LAB — GLUCOSE, CAPILLARY
Glucose-Capillary: 114 — ABNORMAL HIGH
Glucose-Capillary: 114 — ABNORMAL HIGH
Glucose-Capillary: 118 — ABNORMAL HIGH
Glucose-Capillary: 122 — ABNORMAL HIGH

## 2011-04-21 LAB — COMPREHENSIVE METABOLIC PANEL
AST: 35
Albumin: 4.2
Alkaline Phosphatase: 57
CO2: 21
Chloride: 107
GFR calc Af Amer: >60
GFR calc non Af Amer: >60
Potassium: 4.3
Total Bilirubin: 0.6

## 2011-04-21 LAB — TYPE AND SCREEN: ABO/RH(D): O POS

## 2011-04-21 LAB — HEMOGLOBIN AND HEMATOCRIT, BLOOD
HCT: 34.4 — ABNORMAL LOW
Hemoglobin: 11.6 — ABNORMAL LOW

## 2011-04-21 LAB — ABO/RH: ABO/RH(D): O POS

## 2011-04-21 LAB — PLATELET COUNT: Platelets: 191

## 2011-04-21 LAB — MAGNESIUM
Magnesium: 2.3
Magnesium: 2.5
Magnesium: 2.9 — ABNORMAL HIGH

## 2011-04-22 LAB — BASIC METABOLIC PANEL
BUN: 14
Calcium: 9.8
Creatinine, Ser: 0.93
GFR calc non Af Amer: >60
Glucose, Bld: 92
Potassium: 3.7

## 2011-04-22 LAB — CBC
HCT: 47
Platelets: 226
RDW: 13.4

## 2011-05-01 LAB — URINALYSIS, ROUTINE W REFLEX MICROSCOPIC
Bilirubin Urine: NEGATIVE
Nitrite: NEGATIVE
Specific Gravity, Urine: 1.012
pH: 5.5

## 2011-05-01 LAB — COMPREHENSIVE METABOLIC PANEL
Albumin: 4.2
Alkaline Phosphatase: 81
BUN: 4 — ABNORMAL LOW
CO2: 26
Chloride: 102
Creatinine, Ser: 0.91
GFR calc non Af Amer: >60
Glucose, Bld: 119 — ABNORMAL HIGH
Potassium: 3.7
Total Bilirubin: 1.1

## 2011-05-01 LAB — CBC
MCV: 94.9
RBC: 5.34
WBC: 6.6

## 2011-05-01 LAB — DIFFERENTIAL
Basophils Absolute: 0
Basophils Relative: 1
Lymphocytes Relative: 33
Monocytes Absolute: 0.5
Neutro Abs: 3.7
Neutrophils Relative %: 57

## 2011-05-01 LAB — RAPID URINE DRUG SCREEN, HOSP PERFORMED
Opiates: NOT DETECTED
Tetrahydrocannabinol: NOT DETECTED

## 2011-05-01 LAB — LIPASE, BLOOD: Lipase: 26

## 2011-05-29 ENCOUNTER — Encounter (HOSPITAL_COMMUNITY): Payer: Self-pay | Admitting: *Deleted

## 2011-05-29 ENCOUNTER — Other Ambulatory Visit: Payer: Self-pay

## 2011-05-29 ENCOUNTER — Emergency Department (HOSPITAL_COMMUNITY)
Admission: EM | Admit: 2011-05-29 | Discharge: 2011-05-29 | Disposition: A | Discharge status: Home / Self Care | Payer: Self-pay | Attending: Emergency Medicine | Admitting: Emergency Medicine

## 2011-05-29 ENCOUNTER — Emergency Department (HOSPITAL_COMMUNITY): Payer: Self-pay

## 2011-05-29 DIAGNOSIS — I10 Essential (primary) hypertension: Secondary | ICD-10-CM | POA: Insufficient documentation

## 2011-05-29 DIAGNOSIS — R079 Chest pain, unspecified: Secondary | ICD-10-CM | POA: Insufficient documentation

## 2011-05-29 DIAGNOSIS — Z7982 Long term (current) use of aspirin: Secondary | ICD-10-CM | POA: Insufficient documentation

## 2011-05-29 DIAGNOSIS — F17200 Nicotine dependence, unspecified, uncomplicated: Secondary | ICD-10-CM | POA: Insufficient documentation

## 2011-05-29 DIAGNOSIS — Z951 Presence of aortocoronary bypass graft: Secondary | ICD-10-CM | POA: Insufficient documentation

## 2011-05-29 DIAGNOSIS — I252 Old myocardial infarction: Secondary | ICD-10-CM | POA: Insufficient documentation

## 2011-05-29 DIAGNOSIS — E785 Hyperlipidemia, unspecified: Secondary | ICD-10-CM | POA: Insufficient documentation

## 2011-05-29 DIAGNOSIS — Z79899 Other long term (current) drug therapy: Secondary | ICD-10-CM | POA: Insufficient documentation

## 2011-05-29 LAB — PROTIME-INR
INR: 0.91 (ref 0.00–1.49)
Prothrombin Time: 12.4 seconds (ref 11.6–15.2)

## 2011-05-29 LAB — CBC
Hemoglobin: 18.1 g/dL — ABNORMAL HIGH (ref 13.0–17.0)
MCH: 33 pg (ref 26.0–34.0)
MCHC: 36.2 g/dL — ABNORMAL HIGH (ref 30.0–36.0)
MCHC: 35.4 g/dL (ref 30.0–36.0)
Platelets: 280 10*3/uL (ref 150–400)
RBC: 5.48 MIL/uL (ref 4.22–5.81)
RDW: 13.4 % (ref 11.5–15.5)
WBC: 8.1 10*3/uL (ref 4.0–10.5)

## 2011-05-29 LAB — BASIC METABOLIC PANEL
CO2: 23 mEq/L (ref 19–32)
Calcium: 10.6 mg/dL — ABNORMAL HIGH (ref 8.4–10.5)
GFR calc non Af Amer: >90 mL/min (ref >90)
Glucose, Bld: 98 mg/dL (ref 70–99)
Potassium: 4.2 mEq/L (ref 3.5–5.1)
Sodium: 136 mEq/L (ref 135–145)

## 2011-05-29 LAB — POCT I-STAT TROPONIN I: Troponin i, poc: 0 ng/mL (ref 0.00–0.08)

## 2011-05-29 LAB — CK TOTAL AND CKMB (NOT AT ARMC): Total CK: 52 U/L (ref 7–232)

## 2011-05-29 LAB — APTT: aPTT: 32 seconds (ref 24–37)

## 2011-05-29 LAB — TROPONIN I: Troponin I: <0.3 ng/mL (ref <0.30)

## 2011-05-29 MED ORDER — NITROGLYCERIN 0.4 MG SL SUBL: 0.4000 mg | SUBLINGUAL_TABLET | SUBLINGUAL | Status: DC | PRN

## 2011-05-29 MED ORDER — NITROGLYCERIN 2 % TD OINT: 1.0000 [in_us] | TOPICAL_OINTMENT | Freq: Once | TRANSDERMAL | Status: DC

## 2011-05-29 MED ORDER — ASPIRIN 81 MG PO CHEW
324.0000 mg | CHEWABLE_TABLET | Freq: Once | ORAL | Status: AC
  Administered 2011-05-29: 324 mg via ORAL
  Filled 2011-05-29: qty 4

## 2011-05-29 NOTE — ED Provider Notes (Signed)
History     CSN: [id number] Arrival date & time: 05/29/2011 12:43 PM   First MD Initiated Contact with Patient 05/29/11 1703      Chief Complaint  Patient presents with  . Chest Pain    (Consider location/radiation/quality/duration/timing/severity/associated sxs/prior treatment) HPI  Past Medical History  Diagnosis Date  . Hypertension   . MI (myocardial infarction)   . Hx of CABG   . Heart attack   . Bronchitis   . Tobacco user   . Hyperlipidemia     Past Surgical History  Procedure Date  . Coronary artery bypass graft     status post diaphragmatic wall infarction Rx BMS RCA 03-11-08   . Orthopedic surgery     History reviewed. No pertinent family history.  History  Substance Use Topics  . Smoking status: Current Everyday Smoker -- 1.0 packs/day    Types: Cigarettes  . Smokeless tobacco: Never Used  . Alcohol Use: No      Review of Systems  Allergies  Codeine  Home Medications   Current Outpatient Rx  Name Route Sig Dispense Refill  . ASPIRIN 81 MG PO TABS Oral Take 81 mg by mouth daily.      Marland Kitchen CLOPIDOGREL BISULFATE 75 MG PO TABS Oral Take 75 mg by mouth daily.      Marland Kitchen METOPROLOL TARTRATE 25 MG PO TABS Oral Take 25 mg by mouth 2 (two) times daily.     Marland Kitchen SIMVASTATIN 80 MG PO TABS Oral Take 80 mg by mouth at bedtime.      Marland Kitchen NITROGLYCERIN 0.4 MG SL SUBL Sublingual Place 0.4 mg under the tongue every 5 (five) minutes as needed.        BP 121/84  Pulse 78  Temp(Src) 97.5 F (36.4 C) (Oral)  Resp 18  SpO2 97%  Physical Exam  ED Course  Procedures (including critical care time)  Labs Reviewed  CBC - Abnormal; Notable for the following:    Hemoglobin 18.1 (*)    MCHC 36.2 (*)    All other components within normal limits  BASIC METABOLIC PANEL - Abnormal; Notable for the following:    Calcium 10.6 (*)    All other components within normal limits  POCT I-STAT TROPONIN I  CBC  PROTIME-INR  APTT  D-DIMER, QUANTITATIVE  I-STAT TROPONIN I    TROPONIN I  CK TOTAL AND CKMB   Dg Chest 2 View  05/29/2011  *RADIOLOGY REPORT*  Clinical Data: Mid to left-sided chest pain with left arm numbness for a week.  History of bypass surgery 2-3 years ago.  History of cardiac stent.  Smoker  CHEST - 2 VIEW  Comparison: 08/09/2010 a  Findings: The patient is status post median sternotomy and CABG. Heart and mediastinal contours are within normal limits.  The lung fields appear clear with no signs of focal infiltrate or congestive failure.  No pleural fluid or peribronchial cuffing is seen. No stigmata suggestive of early or developing interstitial edema are noted.  Bony structures appear intact.  IMPRESSION: Stable cardiopulmonary appearance with no worrisome focal or acute abnormality seen.  Original Report Authenticated By: Bertha Stakes, M.D.     1. Chest pain       MDM        Leila Schuff S  Signout taken by Dr. Preston Fleeting at 9:07 PM.  Patient with significant coronary artery disease presenting with off-and-on chest pains. Dr. Preston Fleeting has spoken with Dr. wall patient's cardiologist, labs troponin and CK-MB are pending. Per Dr.  walls instructions if both labs are normal patient can be given prescription for nitroglycerin sublingual tablets and sent home with close followup in Dr. Vern Claude office.  Lab troponin and CK-MB are both normal. This time we'll discharge with prescription for nitroglycerin as planned. Patient to followup with Dr. wall.  Results for orders placed during the hospital encounter of 05/29/11  CBC      Component Value Range   WBC 9.2  4.0 - 10.5 (K/uL)   RBC 5.48  4.22 - 5.81 (MIL/uL)   Hemoglobin 18.1 (*) 13.0 - 17.0 (g/dL)   HCT 78.2  95.6 - 21.3 (%)   MCV 91.2  78.0 - 100.0 (fL)   MCH 33.0  26.0 - 34.0 (pg)   MCHC 36.2 (*) 30.0 - 36.0 (g/dL)   RDW 08.6  57.8 - 46.9 (%)   Platelets 280  150 - 400 (K/uL)  BASIC METABOLIC PANEL      Component Value Range   Sodium 136  135 - 145 (mEq/L)   Potassium 4.2  3.5 - 5.1  (mEq/L)   Chloride 100  96 - 112 (mEq/L)   CO2 23  19 - 32 (mEq/L)   Glucose, Bld 98  70 - 99 (mg/dL)   BUN 9  6 - 23 (mg/dL)   Creatinine, Ser 6.29  0.50 - 1.35 (mg/dL)   Calcium 52.8 (*) 8.4 - 10.5 (mg/dL)   GFR calc non Af Amer >90  >90 (mL/min)   GFR calc Af Amer >90  >90 (mL/min)  POCT I-STAT TROPONIN I      Component Value Range   Troponin i, poc 0.00  0.00 - 0.08 (ng/mL)   Comment 3           CBC      Component Value Range   WBC 8.1  4.0 - 10.5 (K/uL)   RBC 5.01  4.22 - 5.81 (MIL/uL)   Hemoglobin 16.3  13.0 - 17.0 (g/dL)   HCT 41.3  24.4 - 01.0 (%)   MCV 92.0  78.0 - 100.0 (fL)   MCH 32.5  26.0 - 34.0 (pg)   MCHC 35.4  30.0 - 36.0 (g/dL)   RDW 27.2  53.6 - 64.4 (%)   Platelets 255  150 - 400 (K/uL)  PROTIME-INR      Component Value Range   Prothrombin Time 12.4  11.6 - 15.2 (seconds)   INR 0.91  0.00 - 1.49   APTT      Component Value Range   aPTT 32  24 - 37 (seconds)  D-DIMER, QUANTITATIVE      Component Value Range   D-Dimer, Quant <0.22  0.00 - 0.48 (ug/mL-FEU)  TROPONIN I      Component Value Range   Troponin I <0.30  <0.30 (ng/mL)  CK TOTAL AND CKMB      Component Value Range   Total CK 52  7 - 232 (U/L)   CK, MB 1.7  0.3 - 4.0 (ng/mL)   Relative Index RELATIVE INDEX IS INVALID  0.0 - 2.5      Angus Seller, [place] 05/29/11 2141

## 2011-05-29 NOTE — ED Notes (Signed)
The pt has had lt upper chest pain for one week intermittently.  He has  Acardiac history with a cabg and stent placements.  He took no meds pta.  .  He has  Also has had some sob with the pain.  None now.

## 2011-05-29 NOTE — ED Notes (Signed)
The pt continues to have chest pain 8/10 but he does not want the nitro because it gives him a headache

## 2011-05-29 NOTE — ED Notes (Signed)
The pt is not having  Much pain now

## 2011-05-29 NOTE — ED Provider Notes (Signed)
History     CSN: [id number] Arrival date & time: 05/29/2011 12:43 PM   First MD Initiated Contact with Patient 05/29/11 1703      Chief Complaint  Patient presents with  . Chest Pain    (Consider location/radiation/quality/duration/timing/severity/associated sxs/prior treatment) HPI 35 year old male has had chest pressure for the last week. He has a history of coronary artery bypass and coronary artery stenting and myocardial infarction his. He states that the pain is currently similar to what his head with his myocardial infarctions. Pain was initially episodic, but it has been constant for the last 4 or 5 days. There is radiation of pain to the left arm but not to the back or neck or jaw. He has no change in his baseline dyspnea. He has had associated nausea, vomiting, and diaphoresis. Pain is currently rated at 6/10, but has been as severe as 8/10. He has not taken any medication to try and help this pain, stating that he has nitroglycerin but it is out of date. Nothing makes the pain better, and nothing makes it worse. He specifically denies worsening with exertion, with body position, with deep breathing. Past Medical History  Diagnosis Date  . Hypertension   . MI (myocardial infarction)   . Hx of CABG   . Heart attack   . Bronchitis   . Tobacco user   . Hyperlipidemia     Past Surgical History  Procedure Date  . Coronary artery bypass graft     status post diaphragmatic wall infarction Rx BMS RCA 03-11-08   . Orthopedic surgery     History reviewed. No pertinent family history.  History  Substance Use Topics  . Smoking status: Current Everyday Smoker -- 1.0 packs/day    Types: Cigarettes  . Smokeless tobacco: Never Used  . Alcohol Use: No      Review of Systems  Allergies  Codeine  Home Medications   Current Outpatient Rx  Name Route Sig Dispense Refill  . ASPIRIN 81 MG PO TABS Oral Take 81 mg by mouth daily.      Marland Kitchen CLOPIDOGREL BISULFATE 75 MG PO TABS Oral  Take 75 mg by mouth daily.      Marland Kitchen METOPROLOL TARTRATE 25 MG PO TABS Oral Take 25 mg by mouth 2 (two) times daily.     Marland Kitchen SIMVASTATIN 80 MG PO TABS Oral Take 80 mg by mouth at bedtime.      Marland Kitchen NITROGLYCERIN 0.4 MG SL SUBL Sublingual Place 0.4 mg under the tongue every 5 (five) minutes as needed.        BP 116/94  Pulse 81  Temp(Src) 97.5 F (36.4 C) (Oral)  Resp 12  SpO2 97%  Physical Exam 35 year old male who is resting comfortably and in no acute distress. Pulse rate was noted to be 105, and blood pressure 143/107. An ECG obtained at 1247 and showed heart rate had dropped to 96. Head is normocephalic atraumatic. EER RLA, EOMI. Oropharynx is clear. Neck is supple without adenopathy or JVD. Lungs are clear without any rales wheezes or rhonchi. Heart regular rate and rhythm without a murmur. There is no chest wall tenderness. Abdomen is soft flat and nontender without any masses or hepatosplenomegaly. Extremities no cyanosis or edema. Neurologic: He is awake, alert, and oriented x3. Cranial nerves are intact. No focal motor or sensory deficits. Deep tendon reflexes are symmetric. Psychiatric: No abnormalities of mood or affect. ED Course  Procedures (including critical care time) ECG shows normal sinus rhythm with a  rate of 95, no ectopy. Normal axis. T waves show possible left atrial enlargement. Normal96 QRS. Normal intervals. Normal ST and T waves. Impression: Possible left atrial enlargement, otherwise normal ECG. When compared with ECG done 08/09/2010, no significant changes were noted. Results for orders placed during the hospital encounter of 05/29/11  CBC      Component Value Range   WBC 9.2  4.0 - 10.5 (K/uL)   RBC 5.48  4.22 - 5.81 (MIL/uL)   Hemoglobin 18.1 (*) 13.0 - 17.0 (g/dL)   HCT 78.2  95.6 - 21.3 (%)   MCV 91.2  78.0 - 100.0 (fL)   MCH 33.0  26.0 - 34.0 (pg)   MCHC 36.2 (*) 30.0 - 36.0 (g/dL)   RDW 08.6  57.8 - 46.9 (%)   Platelets 280  150 - 400 (K/uL)  BASIC METABOLIC  PANEL      Component Value Range   Sodium 136  135 - 145 (mEq/L)   Potassium 4.2  3.5 - 5.1 (mEq/L)   Chloride 100  96 - 112 (mEq/L)   CO2 23  19 - 32 (mEq/L)   Glucose, Bld 98  70 - 99 (mg/dL)   BUN 9  6 - 23 (mg/dL)   Creatinine, Ser 6.29  0.50 - 1.35 (mg/dL)   Calcium 52.8 (*) 8.4 - 10.5 (mg/dL)   GFR calc non Af Amer >90  >90 (mL/min)   GFR calc Af Amer >90  >90 (mL/min)  POCT I-STAT TROPONIN I      Component Value Range   Troponin i, poc 0.00  0.00 - 0.08 (ng/mL)   Comment 3           CBC      Component Value Range   WBC 8.1  4.0 - 10.5 (K/uL)   RBC 5.01  4.22 - 5.81 (MIL/uL)   Hemoglobin 16.3  13.0 - 17.0 (g/dL)   HCT 41.3  24.4 - 01.0 (%)   MCV 92.0  78.0 - 100.0 (fL)   MCH 32.5  26.0 - 34.0 (pg)   MCHC 35.4  30.0 - 36.0 (g/dL)   RDW 27.2  53.6 - 64.4 (%)   Platelets 255  150 - 400 (K/uL)  PROTIME-INR      Component Value Range   Prothrombin Time 12.4  11.6 - 15.2 (seconds)   INR 0.91  0.00 - 1.49   APTT      Component Value Range   aPTT 32  24 - 37 (seconds)  D-DIMER, QUANTITATIVE      Component Value Range   D-Dimer, Quant <0.22  0.00 - 0.48 (ug/mL-FEU)   Dg Chest 2 View  05/29/2011  *RADIOLOGY REPORT*  Clinical Data: Mid to left-sided chest pain with left arm numbness for a week.  History of bypass surgery 2-3 years ago.  History of cardiac stent.  Smoker  CHEST - 2 VIEW  Comparison: 08/09/2010 a  Findings: The patient is status post median sternotomy and CABG. Heart and mediastinal contours are within normal limits.  The lung fields appear clear with no signs of focal infiltrate or congestive failure.  No pleural fluid or peribronchial cuffing is seen. No stigmata suggestive of early or developing interstitial edema are noted.  Bony structures appear intact.  IMPRESSION: Stable cardiopulmonary appearance with no worrisome focal or acute abnormality seen.  Original Report Authenticated By: Bertha Stakes, M.D.    He was given aspirin as well as sublingual  nitroglycerin. Following this the pain is improved, and now  being down to 5/10. Labs Reviewed  CBC - Abnormal; Notable for the following:    Hemoglobin 18.1 (*)    MCHC 36.2 (*)    All other components within normal limits  BASIC METABOLIC PANEL - Abnormal; Notable for the following:    Calcium 10.6 (*)    All other components within normal limits  POCT I-STAT TROPONIN I  I-STAT TROPONIN I  CBC  PROTIME-INR  APTT  D-DIMER, QUANTITATIVE   Dg Chest 2 View  05/29/2011  *RADIOLOGY REPORT*  Clinical Data: Mid to left-sided chest pain with left arm numbness for a week.  History of bypass surgery 2-3 years ago.  History of cardiac stent.  Smoker  CHEST - 2 VIEW  Comparison: 08/09/2010 a  Findings: The patient is status post median sternotomy and CABG. Heart and mediastinal contours are within normal limits.  The lung fields appear clear with no signs of focal infiltrate or congestive failure.  No pleural fluid or peribronchial cuffing is seen. No stigmata suggestive of early or developing interstitial edema are noted.  Bony structures appear intact.  IMPRESSION: Stable cardiopulmonary appearance with no worrisome focal or acute abnormality seen.  Original Report Authenticated By: Bertha Stakes, M.D.     No diagnosis found.    MDM  Note and he chart reviewed. He had a cardiac catheterization in September of 2011 which showed severe native vessel disease, 2 occluded coronary artery bypass grafts, one patent coronary artery bypass graft and a 40% occlusion of a stent.  Case was discussed with Dr. Daleen Squibb who feels that in light of his having constant pain for 5 days with negative cardiac markers and EKG not showing any acute changes that he can safely be sent home with followup in the office next week. Dr. Daleen Squibb has requested that he have a set of cardiac markers done in the main lab to confirm that there has been no cardiac injury.     Dione Booze, MD 05/29/11 2103

## 2011-05-29 NOTE — ED Notes (Signed)
Pt reports central chest pain radiating into left shoulder x 1 week, associated with nausea and vomiting. Reports cabg x 4 with stents. Has not taken any medications today.

## 2011-05-30 NOTE — ED Provider Notes (Signed)
I have personally performed and participated in all the services and procedures documented herein. I have reviewed the findings with the patient. PA was involved only in the review of the lab tests and discharge.   Dione Booze, MD 05/30/11 [phone]

## 2011-07-02 ENCOUNTER — Emergency Department (HOSPITAL_COMMUNITY)
Admission: EM | Admit: 2011-07-02 | Discharge: 2011-07-02 | Disposition: A | Discharge status: Home / Self Care | Payer: Self-pay | Attending: Emergency Medicine | Admitting: Emergency Medicine

## 2011-07-02 ENCOUNTER — Encounter (HOSPITAL_COMMUNITY): Payer: Self-pay | Admitting: *Deleted

## 2011-07-02 DIAGNOSIS — E785 Hyperlipidemia, unspecified: Secondary | ICD-10-CM | POA: Insufficient documentation

## 2011-07-02 DIAGNOSIS — A088 Other specified intestinal infections: Secondary | ICD-10-CM | POA: Insufficient documentation

## 2011-07-02 DIAGNOSIS — I252 Old myocardial infarction: Secondary | ICD-10-CM | POA: Insufficient documentation

## 2011-07-02 DIAGNOSIS — A084 Viral intestinal infection, unspecified: Secondary | ICD-10-CM

## 2011-07-02 DIAGNOSIS — I10 Essential (primary) hypertension: Secondary | ICD-10-CM | POA: Insufficient documentation

## 2011-07-02 DIAGNOSIS — R112 Nausea with vomiting, unspecified: Secondary | ICD-10-CM | POA: Insufficient documentation

## 2011-07-02 DIAGNOSIS — R5381 Other malaise: Secondary | ICD-10-CM | POA: Insufficient documentation

## 2011-07-02 DIAGNOSIS — Z7982 Long term (current) use of aspirin: Secondary | ICD-10-CM | POA: Insufficient documentation

## 2011-07-02 DIAGNOSIS — K7689 Other specified diseases of liver: Secondary | ICD-10-CM | POA: Insufficient documentation

## 2011-07-02 DIAGNOSIS — Z79899 Other long term (current) drug therapy: Secondary | ICD-10-CM | POA: Insufficient documentation

## 2011-07-02 DIAGNOSIS — R197 Diarrhea, unspecified: Secondary | ICD-10-CM | POA: Insufficient documentation

## 2011-07-02 DIAGNOSIS — R5383 Other fatigue: Secondary | ICD-10-CM | POA: Insufficient documentation

## 2011-07-02 MED ORDER — DIPHENOXYLATE-ATROPINE 2.5-0.025 MG PO TABS
2.0000 | ORAL_TABLET | Freq: Once | ORAL | Status: AC
  Administered 2011-07-02: 2 via ORAL
  Filled 2011-07-02: qty 2

## 2011-07-02 MED ORDER — ONDANSETRON 8 MG PO TBDP: 8.0000 mg | ORAL_TABLET | Freq: Three times a day (TID) | ORAL | Status: AC | PRN

## 2011-07-02 MED ORDER — OXYCODONE-ACETAMINOPHEN 5-325 MG PO TABS: 1.0000 | ORAL_TABLET | Freq: Once | ORAL | Status: DC

## 2011-07-02 MED ORDER — ONDANSETRON 4 MG PO TBDP
8.0000 mg | ORAL_TABLET | Freq: Once | ORAL | Status: AC
  Administered 2011-07-02: 8 mg via ORAL
  Filled 2011-07-02: qty 2

## 2011-07-02 MED ORDER — DIPHENOXYLATE-ATROPINE 2.5-0.025 MG PO TABS: 1.0000 | ORAL_TABLET | Freq: Four times a day (QID) | ORAL | Status: AC | PRN

## 2011-07-02 MED ORDER — FAMOTIDINE 20 MG PO TABS
20.0000 mg | ORAL_TABLET | Freq: Once | ORAL | Status: AC
  Administered 2011-07-02: 20 mg via ORAL
  Filled 2011-07-02: qty 1

## 2011-07-02 MED ORDER — FAMOTIDINE 20 MG PO TABS: 20.0000 mg | ORAL_TABLET | Freq: Two times a day (BID) | ORAL | Status: DC | PRN

## 2011-07-02 NOTE — ED Provider Notes (Signed)
History     CSN: [id number] Arrival date & time: 07/02/2011  3:03 PM   First MD Initiated Contact with Patient 07/02/11 1601      Chief Complaint  Patient presents with  . Nausea  . Diarrhea    (Consider location/radiation/quality/duration/timing/severity/associated sxs/prior treatment) HPI Comments: The patient is a 35 year old otherwise healthy male who presents for evaluation of nausea, vomiting, and diarrhea that has been present for the last 3 days with approximately 4-5 episodes of emesis and diarrhea daily. He reports that stool is watery and brown without blood or mucus, and that there is no blood in his emesis, just undigested food. The patient has no known sick contacts that he is aware of. He denies suspect food intake. He denies abdominal pain. Symptoms are described as moderate in intensity and he has not tried any treatment at home. He does not appear dehydrated or toxic appearing in his abdomen is soft and nontender to palpation with normal bowel sounds.  Patient is a 35 y.o. male presenting with diarrhea. The history is provided by the patient and the spouse.  Diarrhea The primary symptoms include fatigue, nausea, vomiting and diarrhea. Primary symptoms do not include fever, weight loss, abdominal pain, melena, hematemesis, jaundice, hematochezia, dysuria, myalgias, arthralgias or rash. The illness began 3 to 5 days ago (3 days ago). The onset was gradual. The problem has not changed since onset. The illness does not include chills, anorexia, dysphagia, odynophagia, bloating, constipation, tenesmus, back pain or itching.    Past Medical History  Diagnosis Date  . Hypertension   . MI (myocardial infarction)   . Hx of CABG   . Heart attack   . Bronchitis   . Tobacco user   . Hyperlipidemia     Past Surgical History  Procedure Date  . Coronary artery bypass graft     status post diaphragmatic wall infarction Rx BMS RCA 03-11-08   . Orthopedic surgery     No  family history on file.  History  Substance Use Topics  . Smoking status: Current Everyday Smoker -- 0.5 packs/day    Types: Cigarettes  . Smokeless tobacco: Never Used  . Alcohol Use: No      Review of Systems  Unable to perform ROS Constitutional: Positive for appetite change and fatigue. Negative for fever, chills, weight loss, diaphoresis, activity change and unexpected weight change.  HENT: Negative for ear pain, congestion, sore throat, rhinorrhea, mouth sores, trouble swallowing, neck pain, neck stiffness and postnasal drip.   Eyes: Negative.   Respiratory: Negative for cough, chest tightness, shortness of breath and wheezing.   Cardiovascular: Negative for chest pain, palpitations and leg swelling.  Gastrointestinal: Positive for nausea, vomiting and diarrhea. Negative for dysphagia, abdominal pain, constipation, blood in stool, melena, hematochezia, abdominal distention, anal bleeding, rectal pain, bloating, anorexia, hematemesis and jaundice.  Genitourinary: Negative for dysuria, urgency, frequency, hematuria and flank pain.  Musculoskeletal: Negative for myalgias, back pain and arthralgias.  Skin: Negative for color change, itching, pallor, rash and wound.  Neurological: Negative for dizziness, syncope, weakness, light-headedness and headaches.  Hematological: Negative for adenopathy.  Psychiatric/Behavioral: Negative.     Allergies  Codeine  Home Medications   Current Outpatient Rx  Name Route Sig Dispense Refill  . ASPIRIN 81 MG PO TABS Oral Take 81 mg by mouth daily.      Marland Kitchen CLOPIDOGREL BISULFATE 75 MG PO TABS Oral Take 75 mg by mouth daily.      Marland Kitchen METOPROLOL TARTRATE 25 MG PO  TABS Oral Take 25 mg by mouth 2 (two) times daily.     Marland Kitchen NITROGLYCERIN 0.4 MG SL SUBL Sublingual Place 0.4 mg under the tongue every 5 (five) minutes as needed.      Marland Kitchen SIMVASTATIN 80 MG PO TABS Oral Take 80 mg by mouth at bedtime.        BP 131/87  Pulse 96  Temp(Src) 98.1 F (36.7 C)  (Oral)  Resp 16  SpO2 98%  Physical Exam  Nursing note and vitals reviewed. Constitutional: He is oriented to person, place, and time. He appears well-developed and well-nourished. He is active.  Non-toxic appearance. He does not have a sickly appearance. He does not appear ill. No distress.  HENT:  Head: Normocephalic and atraumatic.  Right Ear: Hearing, tympanic membrane, external ear and ear canal normal.  Left Ear: Hearing, tympanic membrane, external ear and ear canal normal.  Nose: Nose normal. No mucosal edema.  Mouth/Throat: Uvula is midline and oropharynx is clear and moist. Mucous membranes are dry. No oral lesions. No uvula swelling. No oropharyngeal exudate, posterior oropharyngeal edema, posterior oropharyngeal erythema or tonsillar abscesses.  Eyes: Conjunctivae and EOM are normal. Pupils are equal, round, and reactive to light. Right eye exhibits no chemosis, no discharge and no exudate. Left eye exhibits no chemosis, no discharge and no exudate. Right conjunctiva is not injected. Left conjunctiva is not injected. No scleral icterus.  Neck: Normal range of motion, full passive range of motion without pain and phonation normal. Neck supple. No rigidity. No Brudzinski's sign noted.  Cardiovascular: Normal rate, regular rhythm, intact distal pulses and normal pulses.   No extrasystoles are present.  Pulmonary/Chest: Effort normal and breath sounds normal. No accessory muscle usage. Not tachypneic. No respiratory distress. He has no decreased breath sounds. He has no wheezes. He has no rhonchi. He has no rales. He exhibits no tenderness, no crepitus and no retraction.  Abdominal: Soft. Normal appearance. He exhibits no shifting dullness, no distension, no pulsatile liver, no fluid wave, no abdominal bruit, no ascites, no pulsatile midline mass and no mass. Bowel sounds are increased. There is hepatosplenomegaly. There is no tenderness. There is no rigidity, no rebound, no guarding, no CVA  tenderness, no tenderness at McBurney's point and negative Murphy's sign. No hernia.  Musculoskeletal: Normal range of motion.  Neurological: He is alert and oriented to person, place, and time. He has normal strength and normal reflexes. He is not disoriented. No cranial nerve deficit. Coordination normal. GCS eye subscore is 4. GCS verbal subscore is 5. GCS motor subscore is 6.  Skin: Skin is warm, dry and intact. No bruising, no ecchymosis, no lesion and no rash noted. He is not diaphoretic. No erythema. No pallor.  Psychiatric: He has a normal mood and affect. His speech is normal and behavior is normal. Judgment and thought content normal. Cognition and memory are normal.    ED Course  Procedures (including critical care time)  Labs Reviewed - No data to display No results found.   No diagnosis found.    MDM  The patient has apparent uncomplicated viral gastroenteritis and appears well-hydrated and in no acute distress at this time. He has no abdominal pain to suggest cholecystitis, cholelithiasis, pancreatitis. He appears suitable for symptomatic treatment as an outpatient and discharged from the emergency department with medications to treat nausea, vomiting, and diarrhea. Patient states his understanding of and agreement with this plan of care.        Felisa Bonier, MD  07/02/11 1635 

## 2011-07-02 NOTE — ED Notes (Signed)
Pt has been having nausea and vomiting and diarrhea since early Monday morning.  No abdominal pain or fever or chills with this.  No cough or cold

## 2011-07-02 NOTE — ED Notes (Signed)
Pt here with c/o n/v/d that started 3 days ago.  Pt reports 1 episode of emesis and diarhhea today.  Pt reports mild pain in abdomen and rates it 3/10.  Pt alert and oriented x4 and in NAD.

## 2011-07-29 ENCOUNTER — Other Ambulatory Visit: Payer: Self-pay | Admitting: Cardiovascular Disease

## 2011-07-31 MED ORDER — METOPROLOL TARTRATE 25 MG PO TABS: 25.0000 mg | ORAL_TABLET | Freq: Two times a day (BID) | ORAL | Status: DC

## 2011-10-18 ENCOUNTER — Encounter (HOSPITAL_COMMUNITY): Payer: Self-pay

## 2011-10-18 ENCOUNTER — Inpatient Hospital Stay (HOSPITAL_COMMUNITY)
Admission: EM | Admit: 2011-10-18 | Discharge: 2011-10-20 | DRG: 287 | Disposition: A | Discharge status: Home / Self Care | Payer: Medicaid Other | Attending: Cardiology | Admitting: Cardiology

## 2011-10-18 ENCOUNTER — Emergency Department (HOSPITAL_COMMUNITY): Payer: Medicaid Other

## 2011-10-18 ENCOUNTER — Other Ambulatory Visit: Payer: Self-pay

## 2011-10-18 DIAGNOSIS — R079 Chest pain, unspecified: Secondary | ICD-10-CM

## 2011-10-18 DIAGNOSIS — I10 Essential (primary) hypertension: Secondary | ICD-10-CM | POA: Diagnosis present

## 2011-10-18 DIAGNOSIS — I252 Old myocardial infarction: Secondary | ICD-10-CM

## 2011-10-18 DIAGNOSIS — I2510 Atherosclerotic heart disease of native coronary artery without angina pectoris: Principal | ICD-10-CM | POA: Diagnosis present

## 2011-10-18 DIAGNOSIS — Z951 Presence of aortocoronary bypass graft: Secondary | ICD-10-CM

## 2011-10-18 DIAGNOSIS — E782 Mixed hyperlipidemia: Secondary | ICD-10-CM | POA: Insufficient documentation

## 2011-10-18 DIAGNOSIS — E785 Hyperlipidemia, unspecified: Secondary | ICD-10-CM | POA: Diagnosis present

## 2011-10-18 DIAGNOSIS — F17200 Nicotine dependence, unspecified, uncomplicated: Secondary | ICD-10-CM | POA: Insufficient documentation

## 2011-10-18 DIAGNOSIS — I200 Unstable angina: Secondary | ICD-10-CM

## 2011-10-18 DIAGNOSIS — Z9861 Coronary angioplasty status: Secondary | ICD-10-CM

## 2011-10-18 LAB — BASIC METABOLIC PANEL
BUN: 9 mg/dL (ref 6–23)
Calcium: 9.5 mg/dL (ref 8.4–10.5)
Creatinine, Ser: 0.77 mg/dL (ref 0.50–1.35)
GFR calc Af Amer: >90 mL/min (ref >90)
GFR calc non Af Amer: >90 mL/min (ref >90)

## 2011-10-18 LAB — CBC
HCT: 46 % (ref 39.0–52.0)
MCH: 32.4 pg (ref 26.0–34.0)
MCHC: 35.9 g/dL (ref 30.0–36.0)
MCV: 90.2 fL (ref 78.0–100.0)
Platelets: 305 10*3/uL (ref 150–400)
RDW: 13 % (ref 11.5–15.5)

## 2011-10-18 MED ORDER — ASPIRIN 81 MG PO CHEW
324.0000 mg | CHEWABLE_TABLET | Freq: Once | ORAL | Status: AC
  Administered 2011-10-18: 324 mg via ORAL
  Filled 2011-10-18: qty 4

## 2011-10-18 MED ORDER — HEPARIN BOLUS VIA INFUSION
4000.0000 [IU] | Freq: Once | INTRAVENOUS | Status: AC
  Administered 2011-10-18: 4000 [IU] via INTRAVENOUS

## 2011-10-18 MED ORDER — HEPARIN (PORCINE) IN NACL 100-0.45 UNIT/ML-% IJ SOLN
1400.0000 [IU]/h | INTRAMUSCULAR | Status: DC
  Administered 2011-10-18: 1000 [IU]/h via INTRAVENOUS
  Filled 2011-10-18 (×2): qty 250

## 2011-10-18 MED ORDER — NITROGLYCERIN 0.4 MG SL SUBL
0.4000 mg | SUBLINGUAL_TABLET | SUBLINGUAL | Status: DC | PRN
  Administered 2011-10-19: 0.4 mg via SUBLINGUAL
  Filled 2011-10-18 (×2): qty 25

## 2011-10-18 MED ORDER — NITROGLYCERIN IN D5W 200-5 MCG/ML-% IV SOLN
5.0000 ug/min | INTRAVENOUS | Status: DC
  Administered 2011-10-18: 5 ug/min via INTRAVENOUS
  Filled 2011-10-18: qty 250

## 2011-10-18 NOTE — H&P (Signed)
Primary Cardiologist: Dr. Clifton James Corinda [facility]ub[facility])   Patient Location: transferred from [facility] ED to [facility] unit  Chief Complaint: chest pain  HPI:  [name] is a 36 year old gentleman with coronary artery disease prompting serial PCI and a 4-vessel CABG in 2009 who presented tonight to the [facility] Emergency Department with chest pain.  His coronary history began in 2009 with an inferior MI requiring RCA PCI and subsequent CABG with anatomy as described below. He then presented in 2010 with an NSTEMI in 2010 requiring LCx PCI. He underwent repeat coronary angiography in September of 2011 and was found to have stable native and bypass graft anatomy not requiring any intervention at the time.  Since then, he reports nearly daily, low-level chest pain which he describes as dull and generally unrelated to exertion. He rates this daily chest pain at 3/10. Beginning yesterday (Saturday), he reports experiencing an intensification of his chest pain, described as a substernal central chest pressure with radiation to his left arm. No jaw or neck pain. No associated nausea or diaphoresis. No shortness of breath, no PND/orthopnea, no palpitations, no lightheadedness, no syncope. No recent fevers/chills/sick contacts.   On the basis of his ongoing symptoms, he was transferred from the [facility] to [facility] for further evaluation and management.  Past Medical History:  CAD - s/p MI in 2009 requiring RCA PCI - s/p 4-vessel CABG 2009 (LIMA-LAD, SVG to ramus to OM1, SVG to LADD1) - s/p MI in 2010 requiring LCx PCI - LHCath in 03/2010: severe native disease (80% prox LAD, totally occluded ramus, 40% in-stent RCA lesion, 100% mid-PDA) with patent LIMA-LAD and chronically occluded SVG bypass grafts; EF 45% with inferior HK Hypertension Hyperlipidemia EtOH/drug abuse Tobacco use  Family History  CAD Mother died at age 78 of CHF  Social History  Cigarette smoker; reports  quitting 10 days ago Currently using chewing tobacco on daily basis No EtOH or illicit drug use Accompanied by fiancee  ROS: as per HPI; otherwise is comprehensively negative  Allergies:  Codeine (itching)  Medications (home):  Aspirin 81mg  Clopidogrel 75mg   Metoprolol 25mg  BID Simvastatin 80mg  Famotidine 20mg  BID SL NTG PRN  Exam  Afebrile HR 81 BP 143/96 RR 14 100% on RA  GEN no acute distress; heavily tattooed  NECK supple, JVP is normal at approximately 6 cm H2O  CHEST clear to auscultation anteriorly  CARDIAC regular, S1 S2, no murmurs ABDOMEN soft, non-distended, non-tender, (+) bowel sounds, no bruits EXT warm, no edema present  NEURO alert & reasonably oriented  SKIN warm & dry   Labs  Trop (POC) 0.00 Pro-BNP 43 Na/K 136/4.1 Cl/CO2 102/21 BUN/Cr 9/0.8  WBC  8.6 Hgb 16.5 Plts 305K Urinalysis: pending  Imaging & Testing  CXR: no evident infiltrate or pulmonary edema ECG: normal sinus with inferior Q waves; no ST segment abnormalities present  Assessment/Plan  This is a 36 year old gentleman with coronary disease requiring prior surgical and percutaneous revascularization, ongoing tobacco use, hypertension, and hyperlipdemia who presents with intensification of his central chest pain concerning for an unstable coronary syndrome. His chronic chest symptoms make his overnight management a bit more challenging since, while we may be able to make him comfortable, we may not succeed at making him pain-free. That said, he appears comfortable, while stating his pain is 5/10, and thus far he has had no enzymatic or electrocardiographic evidence of myocardial damage. Given his pain complaints, I think he will likely need to  undergo repeat invasive coronary angiography to investigate whether he has new obstructive lesions amenable to intervention that might be accounting for his symptoms. On his last cath in 2011, his only patent bypass graft was his LIMA to LAD, which if  compromised, I suspect would lead to a more dramatic clinical presentation. If he has any new obstructive disease, I suspect it will be in his previously stented native anatomy.  Aggressive risk factor modification will be essential as well.   1) Chest pain syndrome concerning for unstable angina - Continued dual-antiplatelet therapy in the form of Aspirin & Clopidogrel - IV Heparin infusion given concern for an unstable coronary syndrome - IV Nitroglycerine infusion titrated for pain control to the extent that his BP will tolerate - Metoprolol 25mg  PO BID per his home regimen - NPO after midnight for possible coronary angiography in the morning  2) Hyperlipidemia - Will check a fasting lipid profile to assess his updated status, the results of which will largely influence the degree to which we need to emphasize diet & exercise, since he is already on a hefty dose of Simvastatin  3) Tobacco use - Counseled the patient on the mandatory nature of tobacco cessation (cigs, dip, etc.)  4) Social - Patient and fiancee voiced concerns about their lack of insurance and their ability to afford medication going forward. They would likely benefit from a conversation with a Child psychotherapist.   My impressions and the plan was explained to the patient and to his fiancee. Their questions were answered to the best of my ability.   Zacarias Pontes, MD  Cardiology Fellow On-Call  [phone]

## 2011-10-18 NOTE — ED Provider Notes (Signed)
History     CSN: [id number]  Arrival date & time 10/18/11  1754   First MD Initiated Contact with Patient 10/18/11 1938      Chief Complaint  Patient presents with  . Chest Pain  . Shortness of Breath    (Consider location/radiation/quality/duration/timing/severity/associated sxs/prior treatment) HPI Comments: Has had CABG  Patient is a 36 y.o. male presenting with chest pain. The history is provided by the patient. No language interpreter was used.  Chest Pain Chest pain occurs constantly (gradually gotten worse). The chest pain is worsening. The severity of the pain is moderate. The quality of the pain is described as dull, pressure-like and similar to previous episodes. The pain does not radiate. Exacerbated by: no specific exacerbating measures. Primary symptoms include shortness of breath. Pertinent negatives for primary symptoms include no fever, no fatigue, no cough, no palpitations, no abdominal pain, no nausea, no vomiting and no dizziness.  The shortness of breath began more than 2 days ago. The shortness of breath developed gradually. The shortness of breath is moderate.  Pertinent negatives for associated symptoms include no claudication, no diaphoresis, no lower extremity edema, no near-syncope, no numbness, no orthopnea, no paroxysmal nocturnal dyspnea and no weakness. He tried nitroglycerin for the symptoms. Risk factors include obesity, male gender and smoking/tobacco exposure.  His past medical history is significant for CAD, hyperlipidemia, hypertension and MI.  Procedure history is positive for cardiac catheterization.     Past Medical History  Diagnosis Date  . Hypertension   . MI (myocardial infarction)   . Hx of CABG   . Heart attack   . Bronchitis   . Tobacco user   . Hyperlipidemia     Past Surgical History  Procedure Date  . Coronary artery bypass graft     status post diaphragmatic wall infarction Rx BMS RCA 03-11-08   . Orthopedic surgery     No  family history on file.  History  Substance Use Topics  . Smoking status: Current Everyday Smoker -- 0.5 packs/day    Types: Cigarettes  . Smokeless tobacco: Never Used  . Alcohol Use: No      Review of Systems  Constitutional: Negative for fever, diaphoresis, activity change, appetite change and fatigue.  HENT: Negative for congestion, sore throat, rhinorrhea, neck pain and neck stiffness.   Respiratory: Positive for shortness of breath. Negative for cough.   Cardiovascular: Positive for chest pain. Negative for palpitations, orthopnea, claudication and near-syncope.  Gastrointestinal: Negative for nausea, vomiting and abdominal pain.  Genitourinary: Negative for dysuria, urgency, frequency and flank pain.  Musculoskeletal: Negative for myalgias, back pain and arthralgias.  Neurological: Negative for dizziness, weakness, light-headedness, numbness and headaches.  All other systems reviewed and are negative.    Allergies  Codeine  Home Medications   Current Outpatient Rx  Name Route Sig Dispense Refill  . ASPIRIN 81 MG PO TABS Oral Take 81 mg by mouth daily.      Marland Kitchen CLOPIDOGREL BISULFATE 75 MG PO TABS Oral Take 75 mg by mouth daily.     Marland Kitchen FAMOTIDINE 20 MG PO TABS Oral Take 1 tablet (20 mg total) by mouth 2 (two) times daily as needed for heartburn (upset stomach). 12 tablet 0  . METOPROLOL TARTRATE 25 MG PO TABS Oral Take 1 tablet (25 mg total) by mouth 2 (two) times daily. 60 tablet 2  . ADULT MULTIVITAMIN W/MINERALS CH Oral Take 1 tablet by mouth daily.    Marland Kitchen NITROGLYCERIN 0.4 MG SL SUBL Sublingual  Place 0.4 mg under the tongue every 5 (five) minutes as needed.      Marland Kitchen SIMVASTATIN 80 MG PO TABS Oral Take 80 mg by mouth at bedtime.        BP 143/96  Pulse 81  Temp(Src) 98 F (36.7 C) (Oral)  Resp 17  SpO2 100%  Physical Exam  Nursing note and vitals reviewed. Constitutional: He is oriented to person, place, and time. He appears well-developed and well-nourished.    HENT:  Head: Normocephalic and atraumatic.  Mouth/Throat: Oropharynx is clear and moist.  Eyes: Conjunctivae and EOM are normal. Pupils are equal, round, and reactive to light.  Neck: Normal range of motion. Neck supple.  Cardiovascular: Normal rate, regular rhythm, normal heart sounds and intact distal pulses.  Exam reveals no gallop and no friction rub.   No murmur heard. Pulmonary/Chest: Effort normal and breath sounds normal. He exhibits no tenderness.       Healed median sternotomy incision  Abdominal: Soft. Bowel sounds are normal. There is no tenderness.  Musculoskeletal: Normal range of motion. He exhibits no tenderness.  Neurological: He is alert and oriented to person, place, and time. No cranial nerve deficit.  Skin: Skin is warm and dry. No rash noted.    ED Course  Procedures (including critical care time)  CRITICAL CARE Performed by: Dayton Bailiff   Total critical care time: 30 min  Critical care time was exclusive of separately billable procedures and treating other patients.  Critical care was necessary to treat or prevent imminent or life-threatening deterioration.  Critical care was time spent personally by me on the following activities: development of treatment plan with patient and/or surrogate as well as nursing, discussions with consultants, evaluation of patient's response to treatment, examination of patient, obtaining history from patient or surrogate, ordering and performing treatments and interventions, ordering and review of laboratory studies, ordering and review of radiographic studies, pulse oximetry and re-evaluation of patient's condition.   Date: 10/18/2011  Rate: 89  Rhythm: normal sinus rhythm  QRS Axis: normal  Intervals: normal  ST/T Wave abnormalities: normal  Conduction Disutrbances:none  Narrative Interpretation:   Old EKG Reviewed: unchanged   Labs Reviewed  CBC  BASIC METABOLIC PANEL  PRO B NATRIURETIC PEPTIDE  POCT I-STAT  TROPONIN I   Dg Chest 2 View  10/18/2011  *RADIOLOGY REPORT*  Clinical Data: Chest pain.  CHEST - 2 VIEW  Comparison: 05/29/2011.  Findings:  Cardiopericardial silhouette within normal limits. Mediastinal contours normal. Trachea midline.  No airspace disease or effusion.  CABG.  No interval change compared to prior. Partially radiopaque monitoring buttons are projected over the chest.  IMPRESSION: No active cardiopulmonary disease.  Stable appearance of the chest.  Original Report Authenticated By: Andreas Newport, M.D.     1. Unstable angina       MDM  Patient with significant cardiac history involving quadruple bypass and multiple stents. Is followed by [facility] cardiology. Concerning story for unstable angina. Aspirin administered. Nitroglycerin drip started. Heparin drip started. Discussed with cardiology who wished to have patient transferred to [facility].        Dayton Bailiff, MD 10/18/11 2028

## 2011-10-18 NOTE — ED Notes (Signed)
Pt has HX of cardiac condition- SOB the last month increased today.

## 2011-10-18 NOTE — ED Notes (Signed)
Has taken nito SL without relief already had ASA

## 2011-10-19 ENCOUNTER — Encounter (HOSPITAL_COMMUNITY): Payer: Self-pay | Admitting: Cardiology

## 2011-10-19 ENCOUNTER — Encounter (HOSPITAL_COMMUNITY)
Admission: EM | Disposition: A | Discharge status: Home / Self Care | Payer: Self-pay | Source: Home / Self Care | Attending: Cardiology

## 2011-10-19 DIAGNOSIS — I2510 Atherosclerotic heart disease of native coronary artery without angina pectoris: Secondary | ICD-10-CM

## 2011-10-19 HISTORY — PX: LEFT HEART CATHETERIZATION WITH CORONARY/GRAFT ANGIOGRAM: SHX5450

## 2011-10-19 LAB — BASIC METABOLIC PANEL
BUN: 11 mg/dL (ref 6–23)
CO2: 21 mEq/L (ref 19–32)
Chloride: 104 mEq/L (ref 96–112)
Creatinine, Ser: 0.83 mg/dL (ref 0.50–1.35)
Glucose, Bld: 110 mg/dL — ABNORMAL HIGH (ref 70–99)

## 2011-10-19 LAB — CARDIAC PANEL(CRET KIN+CKTOT+MB+TROPI)
CK, MB: 1.3 ng/mL (ref 0.3–4.0)
CK, MB: 1.3 ng/mL (ref 0.3–4.0)
CK, MB: 1.3 ng/mL (ref 0.3–4.0)
Troponin I: <0.3 ng/mL (ref <0.30)
Troponin I: <0.3 ng/mL (ref <0.30)

## 2011-10-19 LAB — PROTIME-INR: INR: 0.93 (ref 0.00–1.49)

## 2011-10-19 LAB — CBC
HCT: 41.2 % (ref 39.0–52.0)
MCH: 31.9 pg (ref 26.0–34.0)
MCHC: 35.2 g/dL (ref 30.0–36.0)
MCV: 90.5 fL (ref 78.0–100.0)
RDW: 13.4 % (ref 11.5–15.5)

## 2011-10-19 LAB — D-DIMER, QUANTITATIVE: D-Dimer, Quant: <0.22 ug/mL-FEU (ref 0.00–0.48)

## 2011-10-19 LAB — LIPID PANEL
Cholesterol: 236 mg/dL — ABNORMAL HIGH (ref 0–200)
Triglycerides: 775 mg/dL — ABNORMAL HIGH (ref <150)
VLDL: UNDETERMINED mg/dL (ref 0–40)

## 2011-10-19 LAB — MRSA PCR SCREENING: MRSA by PCR: NEGATIVE

## 2011-10-19 SURGERY — LEFT HEART CATHETERIZATION WITH CORONARY/GRAFT ANGIOGRAM
Anesthesia: LOCAL

## 2011-10-19 MED ORDER — METOPROLOL TARTRATE 25 MG PO TABS
25.0000 mg | ORAL_TABLET | Freq: Two times a day (BID) | ORAL | Status: DC
  Administered 2011-10-19 – 2011-10-20 (×2): 25 mg via ORAL
  Filled 2011-10-19 (×2): qty 1

## 2011-10-19 MED ORDER — ONDANSETRON HCL 4 MG/2ML IJ SOLN: 4.0000 mg | Freq: Four times a day (QID) | INTRAMUSCULAR | Status: DC | PRN

## 2011-10-19 MED ORDER — SODIUM CHLORIDE 0.9 % IJ SOLN: 3.0000 mL | Freq: Two times a day (BID) | INTRAMUSCULAR | Status: DC

## 2011-10-19 MED ORDER — HEPARIN (PORCINE) IN NACL 2-0.9 UNIT/ML-% IJ SOLN
INTRAMUSCULAR | Status: AC
  Filled 2011-10-19: qty 2000

## 2011-10-19 MED ORDER — CLOPIDOGREL BISULFATE 75 MG PO TABS: 75.0000 mg | ORAL_TABLET | Freq: Every day | ORAL | Status: DC

## 2011-10-19 MED ORDER — ASPIRIN EC 81 MG PO TBEC
81.0000 mg | DELAYED_RELEASE_TABLET | Freq: Every day | ORAL | Status: DC
  Administered 2011-10-20: 81 mg via ORAL
  Filled 2011-10-19: qty 1

## 2011-10-19 MED ORDER — CLOPIDOGREL BISULFATE 75 MG PO TABS
75.0000 mg | ORAL_TABLET | Freq: Every day | ORAL | Status: DC
  Administered 2011-10-19: 75 mg via ORAL
  Filled 2011-10-19: qty 1

## 2011-10-19 MED ORDER — SIMVASTATIN 80 MG PO TABS
80.0000 mg | ORAL_TABLET | Freq: Every day | ORAL | Status: DC
  Filled 2011-10-19: qty 1

## 2011-10-19 MED ORDER — CLOPIDOGREL BISULFATE 75 MG PO TABS
75.0000 mg | ORAL_TABLET | Freq: Every day | ORAL | Status: DC
  Administered 2011-10-20: 75 mg via ORAL
  Filled 2011-10-19: qty 1

## 2011-10-19 MED ORDER — SODIUM CHLORIDE 0.9 % IJ SOLN: 3.0000 mL | INTRAMUSCULAR | Status: DC | PRN

## 2011-10-19 MED ORDER — NITROGLYCERIN IN D5W 200-5 MCG/ML-% IV SOLN: 3.0000 ug/min | INTRAVENOUS | Status: DC

## 2011-10-19 MED ORDER — ASPIRIN 300 MG RE SUPP
300.0000 mg | RECTAL | Status: DC
  Filled 2011-10-19: qty 1

## 2011-10-19 MED ORDER — ACETAMINOPHEN 325 MG PO TABS
650.0000 mg | ORAL_TABLET | ORAL | Status: DC | PRN
  Administered 2011-10-19 (×2): 650 mg via ORAL
  Filled 2011-10-19 (×2): qty 2

## 2011-10-19 MED ORDER — NITROGLYCERIN 0.2 MG/ML ON CALL CATH LAB
INTRAVENOUS | Status: AC
  Filled 2011-10-19: qty 1

## 2011-10-19 MED ORDER — LIDOCAINE HCL (PF) 1 % IJ SOLN
INTRAMUSCULAR | Status: AC
  Filled 2011-10-19: qty 30

## 2011-10-19 MED ORDER — ASPIRIN 81 MG PO CHEW: 324.0000 mg | CHEWABLE_TABLET | ORAL | Status: DC

## 2011-10-19 MED ORDER — MIDAZOLAM HCL 2 MG/2ML IJ SOLN
INTRAMUSCULAR | Status: AC
  Filled 2011-10-19: qty 2

## 2011-10-19 MED ORDER — FAMOTIDINE 20 MG PO TABS
20.0000 mg | ORAL_TABLET | Freq: Two times a day (BID) | ORAL | Status: DC
  Administered 2011-10-19 – 2011-10-20 (×4): 20 mg via ORAL
  Filled 2011-10-19 (×5): qty 1

## 2011-10-19 MED ORDER — SODIUM CHLORIDE 0.9 % IV SOLN: 250.0000 mL | INTRAVENOUS | Status: DC | PRN

## 2011-10-19 MED ORDER — SODIUM CHLORIDE 0.9 % IV SOLN
INTRAVENOUS | Status: DC
  Administered 2011-10-19: 10 mL via INTRAVENOUS

## 2011-10-19 MED ORDER — METOPROLOL TARTRATE 25 MG PO TABS
25.0000 mg | ORAL_TABLET | Freq: Two times a day (BID) | ORAL | Status: DC
  Administered 2011-10-19 (×2): 25 mg via ORAL
  Filled 2011-10-19 (×3): qty 1

## 2011-10-19 MED ORDER — ASPIRIN 81 MG PO TABS: 81.0000 mg | ORAL_TABLET | Freq: Every day | ORAL | Status: DC

## 2011-10-19 MED ORDER — DIAZEPAM 5 MG PO TABS
5.0000 mg | ORAL_TABLET | ORAL | Status: AC
  Administered 2011-10-19: 5 mg via ORAL
  Filled 2011-10-19: qty 1

## 2011-10-19 MED ORDER — ACETAMINOPHEN 325 MG PO TABS
650.0000 mg | ORAL_TABLET | ORAL | Status: DC | PRN
  Administered 2011-10-19: 650 mg via ORAL
  Filled 2011-10-19: qty 2

## 2011-10-19 MED ORDER — FAMOTIDINE 20 MG PO TABS
20.0000 mg | ORAL_TABLET | Freq: Two times a day (BID) | ORAL | Status: DC | PRN
  Filled 2011-10-19: qty 1

## 2011-10-19 MED ORDER — SODIUM CHLORIDE 0.9 % IV SOLN: INTRAVENOUS | Status: AC

## 2011-10-19 MED ORDER — NITROGLYCERIN 0.4 MG SL SUBL: 0.4000 mg | SUBLINGUAL_TABLET | SUBLINGUAL | Status: DC | PRN

## 2011-10-19 MED ORDER — SODIUM CHLORIDE 0.9 % IV SOLN: 1.0000 mL/kg/h | INTRAVENOUS | Status: DC

## 2011-10-19 MED ORDER — ADULT MULTIVITAMIN W/MINERALS CH
1.0000 | ORAL_TABLET | Freq: Every day | ORAL | Status: DC
  Administered 2011-10-19 – 2011-10-20 (×2): 1 via ORAL
  Filled 2011-10-19 (×2): qty 1

## 2011-10-19 MED ORDER — SIMVASTATIN 40 MG PO TABS
40.0000 mg | ORAL_TABLET | Freq: Every day | ORAL | Status: DC
  Administered 2011-10-19: 40 mg via ORAL
  Filled 2011-10-19 (×2): qty 1

## 2011-10-19 MED ORDER — FENTANYL CITRATE 0.05 MG/ML IJ SOLN
INTRAMUSCULAR | Status: AC
  Filled 2011-10-19: qty 2

## 2011-10-19 NOTE — H&P (View-Only) (Signed)
    Subjective:  3/10 chest pain, improving. No dyspnea. No other complaints.  Objective:  Vital Signs in the last 24 hours: Temp:  [98 F (36.7 C)-98.6 F (37 C)] 98.1 F (36.7 C) (04/01 0326) Pulse Rate:  [57-97] 59  (04/01 0600) Resp:  [7-18] 10  (04/01 0600) BP: (105-143)/(57-96) 105/86 mmHg (04/01 0600) SpO2:  [89 %-100 %] 99 % (04/01 0600) Weight:  [122.8 kg (270 lb 11.6 oz)] 122.8 kg (270 lb 11.6 oz) (03/31 2249)  Intake/Output from previous day: 03/31 0701 - 04/01 0700 In: 355 [P.O.:120; I.V.:235] Out: -   Physical Exam: Pt is alert and oriented, overweight male in NAD HEENT: normal Neck: JVP - normal, carotids 2+= without bruits Lungs: CTA bilaterally CV: RRR without murmur or gallop Abd: soft, NT, Positive BS, no hepatomegaly Ext: no C/C/E, distal pulses intact and equal Skin: warm/dry no rash   Lab Results:  Basename 10/19/11 0445 10/18/11 1923  WBC 8.5 8.6  HGB 14.5 16.5  PLT 239 305    Basename 10/19/11 0445 10/18/11 1923  NA 136 136  K 3.7 4.1  CL 104 102  CO2 21 21  GLUCOSE 110* 92  BUN 11 9  CREATININE 0.83 0.77    Basename 10/19/11 0445 10/19/11 0041  TROPONINI <0.30 <0.30   Tele:sinus rhythm  Assessment/Plan:  1. Unstable angina. Improving but still with chest pain on IV heparin and NTG. He is on chronic ASA and plavix. Plan cath possible PCI today. Reviewed risks, indication, and alternatives with patient who understands and agrees to proceed. No objective evidence of ischemia/infarction with normal enzymes and EKG. 2. Known CAD s/p CABG and multiple PCI procedures 3. Ongoing tobacco 4. Hyperlipidemia - marked hypertriglyceridemia - needs intensive risk modification. Not sure if he's been compliant with meds - he hasn't been seen in our office since 2011, has upcoming appt with Dr Clifton James.  Tonny Bollman, M.D. 10/19/2011, 8:08 AM

## 2011-10-19 NOTE — Progress Notes (Signed)
   CARE MANAGEMENT NOTE 10/19/2011  Patient:  MALE, MINISH   Account Number:  [id number]  Date Initiated:  10/19/2011  Documentation initiated by:  GRAVES-BIGELOW,Vanette Noguchi  Subjective/Objective Assessment:   Pt admitted with cp. S/p LHC. Plan for home on plavix. Pt is from home with children. Girlfriend at bedside. Pt states he uses IT consultant.     Action/Plan:   CM did make him aware of [facility] meds 3.99. MD please d/c pt on all generic meds for cost reasons. Pt does not have PCP. His girlfriend has been trying to get them both at the outpatient clinic. [facility] not [facility] any new [facility].   Anticipated DC Date:  10/21/2011   Anticipated DC Plan:  HOME/SELF CARE  In-house referral  Financial Counselor      DC Planning Services  CM consult      Choice offered to / List presented to:             Status of service:  Completed, signed off Medicare Important Message given?   (If response is "NO", the following Medicare IM given date fields will be blank) Date Medicare IM given:   Date Additional Medicare IM given:    Discharge Disposition:  HOME/SELF CARE  Per UR Regulation:    If discussed at Long Length of Stay Meetings, dates discussed:    Comments:  10-19-11 1447 Tomi Bamberger, RN,BSN [phone] No other needs assessed by CM at this time.

## 2011-10-19 NOTE — Interval H&P Note (Signed)
History and Physical Interval Note:  10/19/2011 11:05 AM  [name]  has presented today for surgery, with the diagnosis of chest pain  The various methods of treatment have been discussed with the patient and fiance.   I have reviewed his prior reports.  We will perform from the femoral approach since he has had prior CABG.  . After consideration of risks, benefits and other options for treatment, the patient has consented to  Procedure(s) (LRB): LEFT HEART CATHETERIZATION WITH CORONARY/GRAFT ANGIOGRAM (N/A) as a surgical intervention .  The patients' history has been reviewed, patient examined, no change in status, stable for surgery.  I have reviewed the patients' chart and labs.  Questions were answered to the patient's satisfaction.     [name]

## 2011-10-19 NOTE — Progress Notes (Signed)
    Subjective:  3/10 chest pain, improving. No dyspnea. No other complaints.  Objective:  Vital Signs in the last 24 hours: Temp:  [98 F (36.7 C)-98.6 F (37 C)] 98.1 F (36.7 C) (04/01 0326) Pulse Rate:  [57-97] 59  (04/01 0600) Resp:  [7-18] 10  (04/01 0600) BP: (105-143)/(57-96) 105/86 mmHg (04/01 0600) SpO2:  [89 %-100 %] 99 % (04/01 0600) Weight:  [122.8 kg (270 lb 11.6 oz)] 122.8 kg (270 lb 11.6 oz) (03/31 2249)  Intake/Output from previous day: 03/31 0701 - 04/01 0700 In: 355 [P.O.:120; I.V.:235] Out: -   Physical Exam: Pt is alert and oriented, overweight male in NAD HEENT: normal Neck: JVP - normal, carotids 2+= without bruits Lungs: CTA bilaterally CV: RRR without murmur or gallop Abd: soft, NT, Positive BS, no hepatomegaly Ext: no C/C/E, distal pulses intact and equal Skin: warm/dry no rash   Lab Results:  Basename 10/19/11 0445 10/18/11 1923  WBC 8.5 8.6  HGB 14.5 16.5  PLT 239 305    Basename 10/19/11 0445 10/18/11 1923  NA 136 136  K 3.7 4.1  CL 104 102  CO2 21 21  GLUCOSE 110* 92  BUN 11 9  CREATININE 0.83 0.77    Basename 10/19/11 0445 10/19/11 0041  TROPONINI <0.30 <0.30   Tele:sinus rhythm  Assessment/Plan:  1. Unstable angina. Improving but still with chest pain on IV heparin and NTG. He is on chronic ASA and plavix. Plan cath possible PCI today. Reviewed risks, indication, and alternatives with patient who understands and agrees to proceed. No objective evidence of ischemia/infarction with normal enzymes and EKG. 2. Known CAD s/p CABG and multiple PCI procedures 3. Ongoing tobacco 4. Hyperlipidemia - marked hypertriglyceridemia - needs intensive risk modification. Not sure if he's been compliant with meds - he hasn't been seen in our office since 2011, has upcoming appt with Dr McAlhany.  Bitha Fauteux, M.D. 10/19/2011, 8:08 AM     

## 2011-10-19 NOTE — Progress Notes (Signed)
ANTICOAGULATION CONSULT NOTE - Initial Consult  Pharmacy Consult for Heparin Indication: chest pain/ACS/STEMI  Allergies  Allergen Reactions  . Codeine Itching    Patient Measurements: Height: 5\' 10"  (177.8 cm) Weight: 270 lb 11.6 oz (122.8 kg) IBW/kg (Calculated) : 73  Heparin Dosing Weight: 100 kg  Vital Signs: Temp: 98.3 F (36.8 C) (03/31 2249) Temp src: Oral (03/31 2249) BP: 142/81 mmHg (04/01 0100) Pulse Rate: 87  (04/01 0100)  Labs:  Basename 10/18/11 1923  HGB 16.5  HCT 46.0  PLT 305  APTT --  LABPROT --  INR --  HEPARINUNFRC --  CREATININE 0.77  CKTOTAL --  CKMB --  TROPONINI --   Estimated Creatinine Clearance: 169.3 ml/min (by C-G formula based on Cr of 0.77).  Medical History: Past Medical History  Diagnosis Date  . Hypertension   . MI (myocardial infarction)   . Hx of CABG   . Heart attack   . Bronchitis   . Tobacco user   . Hyperlipidemia    Assessment:  s/p MI & PCI in 2009, CABG x 4 in 2009; MI & PCI in 2010; LHC in 2011. Noted low-level chest pain, nearly daily.  For possible cardiac cath later today.   Heparin begun in [facility] ~ 9:30pm with 4000 unit bolus and 1000 units/hr infusion.  Low dose for weight.  Goal of Therapy:  Heparin level 0.3-0.7 units/ml   Plan:    Will increase heparin drip to 1400 units/hr.   Heparin level and CBC in ~ 6hrs.   Will adjust as needed to achieve target heparin level.  Dennie Fetters, RPh Pager: [phone] 10/19/2011,1:11 AM

## 2011-10-19 NOTE — Progress Notes (Signed)
Pt c.o 8/10 chest pressure  Increased NTG gtt then gave 1 SL NTG.  bp 137/78 (91).  Pt had little relief so I increased the gtt again.  BP 140/65 at 0055 pain now 3/10. Will continue to monitor. Emilie [place] Liter

## 2011-10-19 NOTE — Progress Notes (Signed)
ANTICOAGULATION CONSULT NOTE - Follow Up Consult  Pharmacy Consult for Heparin  Indication: chest pain/ACS  Allergies  Allergen Reactions  . Codeine Itching    Patient Measurements: Height: 5\' 10"  (177.8 cm) Weight: 270 lb 11.6 oz (122.8 kg) IBW/kg (Calculated) : 73  Heparin Dosing Weight: 100kg  Vital Signs: Temp: 97.5 F (36.4 C) (04/01 0817) Temp src: Oral (04/01 0817) BP: 115/65 mmHg (04/01 0817) Pulse Rate: 105  (04/01 0817)  Labs:  Basename 10/19/11 0835 10/19/11 0445 10/19/11 0041 10/18/11 1923  HGB -- 14.5 -- 16.5  HCT -- 41.2 -- 46.0  PLT -- 239 -- 305  APTT -- -- -- --  LABPROT 12.7 -- -- --  INR 0.93 -- -- --  HEPARINUNFRC 0.40 -- -- --  CREATININE -- 0.83 -- 0.77  CKTOTAL -- 45 47 --  CKMB -- 1.3 1.3 --  TROPONINI -- <0.30 <0.30 --   Estimated Creatinine Clearance: 163.2 ml/min (by C-G formula based on Cr of 0.83).   Assessment: s/p MI & PCI in 2009, CABG x 4 in 2009; MI & PCI in 2010; LHC in 2011, has noted CP daily plan cath today.  Heparin drip 1400 uts/hr HL 0.4 at goal cbc stable, no bleeding noted.  Asa,clop,statin,metop ? EF no ACEI pta.  Continues to have CP today despite NTG, heprarin drip.    Goal of Therapy:  Heparin level 0.3-0.7 units/ml   Plan:  Continue Heparin drip 1400 uts/hr F/u after cath  Marcelino Scot 10/19/2011,10:53 AM

## 2011-10-19 NOTE — CV Procedure (Signed)
   Cardiac Catheterization Procedure Note  Name: [name] MRN: [id number] DOB: 1976-01-07  Procedure: Left Heart Cath, Selective Coronary Angiography, SLIMA angio, LV angiography  Indication: Chest pain post CABG and post PCI   Procedural details: The right groin was prepped, draped, and anesthetized with 1% lidocaine. Using a smart needle and fluoro guided landmarks,  a 5 French sheath was introduced into the right femoral artery. Standard Judkins catheters were used for coronary angiography, left ventriculography. Catheter exchanges were performed over a guidewire. There were no immediate procedural complications. The patient was transferred to the post catheterization recovery area for further monitoring.  The SVGs were known to be occluded from prior studies and were not injected as such.    Procedural Findings: Hemodynamics:  AO 110/87 (98) LV 110/12   Coronary angiography: Coronary dominance: right  Left mainstem: Normal without significant obstruction  Left anterior descending (LAD): Has segmental 70% stenosis leading into the diagonal.  The diagonal graft is known to be occluded.  There is competitive filling distally due to the IMA.    Left circumflex (LCx): Small diffusely diseased ramus with mid long 90% occlusion.  The first and second marginals are small and not critically diseased.  The OM 3 has been stented and is widely patent out to the anterolateral wall.  There is a 40-50% distal stenosis at a flexion point in the vessel.    The IMA to the distal LAD is intact.  There is kink in the mid vessel that is well visualized, but likely does not limit flow distally.  Right coronary artery (RCA): The RCA has been previously stented in the distal portion of the mid vessel.  There is about 40% smooth narrowing at this site.  The PDA and PLA2 are large and without significant narrowing.  The PLA 1 has a proximal 80-90% stenosis, but similar to the 2011 exam.    Left  ventriculography: Left ventricular systolic function is normal, LVEF is estimated at 55-65%, there is no significant mitral regurgitation. ? Minimal inferior hypokinesis   Final Conclusions:   1.  Preserved overall LV function 2.  History of continued tobacco use 3.  Patent IMA to the LAD with a kink that does not appear flow limiting 4.  Patent stent to the OM3 5.  Severe disease of the smaller ramus which has been previously bypassed with an occluded graft 6.  Disease of the PLA 1.  Recommendations:  1.  Review options with Dr. Clifton James.  Leaning toward continued medical therapy, and dc of tobacco and nicotine products.    [name] 10/19/2011, 1:23 PM

## 2011-10-19 NOTE — Progress Notes (Signed)
Clinical Social Work Department BRIEF PSYCHOSOCIAL ASSESSMENT 10/19/2011  Patient:  [name], [name]     Account Number:  [id number]     Admit date:  10/18/2011  Clinical Social Worker:  Hulan Fray  Date/Time:  10/19/2011 03:30 PM  Referred by:  RN  Date Referred:  10/19/2011 Referred for  Other - See comment   Other Referral:   Financial concerns   Interview type:  Other - See comment Other interview type:   Patient and girlfriend, Lurena Joiner    PSYCHOSOCIAL DATA Living Status:  FAMILY Admitted from facility:   Level of care:   Primary support name:  Horace Primary support relationship to patient:  PARENT Degree of support available:   good. Patient's girlfriend, is also a good support.    CURRENT CONCERNS Current Concerns  Financial Resources   Other Concerns:    SOCIAL WORK ASSESSMENT / PLAN Clinical Social Worker met with patient and girlfriend at bedside. Patient is divorced. Patient reported that he has two children, one lives with his ex wife and the other lives with him. The patient reported that this living arrangement was put in place, so neither party could sue the other for child support. The patient reports that he has no insurance and is interested in applying for disability and medicaid. Patient stated that he had Medicaid previously, but when he was working, his medicaid was discontinued. Patient is concerned about paying this hospital bill as well. CSW called financial counseling to assist with his current concerns. CSW will sign off, as social work intervention is no longer needed.   Assessment/plan status:  No Further Intervention Required Other assessment/ plan:   CSW called Lennox Solders, in Financial Counseling to assist with medicaid application and his hospital bill.   Information/referral to community resources:    PATIENT'S/FAMILY'S RESPONSE TO PLAN OF CARE: Patient and girlfriend are agreeable to CSW calling financial counseling for  assistance.

## 2011-10-20 DIAGNOSIS — F17200 Nicotine dependence, unspecified, uncomplicated: Secondary | ICD-10-CM

## 2011-10-20 DIAGNOSIS — I2510 Atherosclerotic heart disease of native coronary artery without angina pectoris: Principal | ICD-10-CM

## 2011-10-20 DIAGNOSIS — I200 Unstable angina: Secondary | ICD-10-CM

## 2011-10-20 LAB — CBC
HCT: 43.5 % (ref 39.0–52.0)
Hemoglobin: 15.4 g/dL (ref 13.0–17.0)
MCH: 32 pg (ref 26.0–34.0)
MCHC: 35.4 g/dL (ref 30.0–36.0)
MCV: 90.4 fL (ref 78.0–100.0)

## 2011-10-20 MED ORDER — SIMVASTATIN 80 MG PO TABS: 80.0000 mg | ORAL_TABLET | Freq: Every day | ORAL | Status: DC

## 2011-10-20 MED ORDER — ISOSORBIDE MONONITRATE ER 30 MG PO TB24: 30.0000 mg | ORAL_TABLET | Freq: Every day | ORAL | Status: DC

## 2011-10-20 MED ORDER — METOPROLOL TARTRATE 25 MG PO TABS: 25.0000 mg | ORAL_TABLET | Freq: Two times a day (BID) | ORAL | Status: DC

## 2011-10-20 MED ORDER — FENOFIBRATE 48 MG PO TABS: 48.0000 mg | ORAL_TABLET | Freq: Every day | ORAL | Status: DC

## 2011-10-20 NOTE — Consult Note (Signed)
Pt smokes 1/2 ppd but later informs me that he has been also using the e-cigarettes . Discussed e-cigarettes in detail and discouraged pt from using it. Recommended nicotine patch. Pt very eager and ready to quit. He is very willing to use the nicotine patch. Recommended 21 mg patch and its use instructions as well as how to taper. Referred to 1-800 quit now for f/u and support. Discussed oral fixation substitutes, second hand smoke and in home smoking policy. Reviewed and gave pt Written education/contact information.

## 2011-10-20 NOTE — Discharge Instructions (Signed)
PLEASE REMEMBER TO BRING ALL OF YOUR MEDICATIONS TO EACH OF YOUR FOLLOW-UP OFFICE VISITS.  PLEASE CALL THE OFFICE AT [phone] IF YOU HAVE DIFFICULTY AFFORDING YOUR MEDICATIONS.   PLEASE ATTEND ALL SCHEDULED FOLLOW-UP APPOINTMENTS.  Activity: Increase activity slowly as tolerated. You may shower, but no soaking baths for 6 days. No driving for 1 day. No lifting over 5 lbs for 6 days. No sexual activity for 6 days.   You May Return to Work: in 6 days (if applicable)  Wound Care: You may wash cath site gently with soap and water. Keep cath site clean and dry. If you notice pain, swelling, bleeding or pus at your cath site, please call [phone].  Angina Angina is chest discomfort caused by lack of oxygen to the heart muscle. It is a warning sign that there is a blood flow problem to your heart. Angina is referred to as either stable or unstable. Stable angina often happens with the same kind of activity, lasts a few minutes, and feels the same each time. Unstable angina has no pattern, no warning, lasts longer, and is more serious. Unstable angina might predict a heart attack. HOME CARE   Understand how to take your medicine and what side effects to expect.   Do not stop the medicines.   Do not change how much you take (dosage) on your own.   Write down any side effects. Tell your doctor what they are.   Mild exercise may help. Start exercising only as told by your doctor.   You can still have a sexual relationship if it does not cause angina. Tell your doctor if it does.   Stop smoking. Do not use gum or patches that help people quit smoking until you check with your doctor.   Lose weight if you are overweight. Eat a heart-healthy diet that is low in fat and salt.   Keep all follow-up visits with your doctor. This is important!  GET HELP RIGHT AWAY IF:   Your angina seems to happen more often or lasts longer.   You are having side effects from your medicine.   Your chest pain  spreads to the arms, back, neck, or jaw (especially if the pain is crushing or pressure-like).   You are sweating, feel sick to your stomach (nauseous), or have shortness of breath.   You have an attack that does not get better after rest or taking medicine.   You wake from sleep with chest pain.   You feel dizzy, faint, or feel very tired (fatigued).   You have chest pain that is different from your usual angina.  Any of these problems may be a sign of a serious problem that is an emergency. Do not wait to see if the problems will go away. Get medical help right away. Call your local emergency services (911 in [place] Do not drive yourself to the hospital. MAKE SURE YOU:   Understand these instructions.   Will watch your condition.   Will get help right away if you are not doing well or get worse.  Document Released: 12/23/2007 Document Revised: 06/25/2011 Document Reviewed: 12/23/2007 [facility] Patient Information 2012 [place], [place].

## 2011-10-20 NOTE — Progress Notes (Signed)
Patient Name: [name] Date of Encounter: 10/20/2011     Principal Problem:  *Unstable angina Active Problems:  HYPERLIPIDEMIA-MIXED  TOBACCO USE  CAD, AUTOLOGOUS BYPASS GRAFT  CAD in native artery    SUBJECTIVE: Doing well this AM. No complaints. Specifically denies chest pain, sob, palpitations, lightheadedness, abdominal/back pain, n/v. Voiced concerns regarding financial obstacles and unemployment.    OBJECTIVE  Filed Vitals:   10/20/11 0002 10/20/11 0405 10/20/11 0719 10/20/11 0724  BP:   116/81   Pulse:   72   Temp: 97.5 F (36.4 C) 97.6 F (36.4 C)  97.4 F (36.3 C)  TempSrc: Oral Oral Oral   Resp:      Height:      Weight:      SpO2: 98% 97% 99%     Intake/Output Summary (Last 24 hours) at 10/20/11 0758 Last data filed at 10/19/11 1600  Gross per 24 hour  Intake    835 ml  Output    400 ml  Net    435 ml   Weight change:   PHYSICAL EXAM  General: Well developed, well nourished, in no acute distress. Head: Normocephalic, atraumatic, sclera non-icteric, no xanthomas Neck: Supple without bruits or JVD. Lungs: Resp regular and unlabored, CTAB without wheezing, rales or rhonchi Heart: RRR no s3, s4, or murmurs. Abdomen: Soft, non-tender, non-distended, BS + x 4.  Msk:  Strength and tone appears normal for age. Extremities: No clubbing, cyanosis or edema. DP/PT/Radials 2+ and equal bilaterally. Neuro: Alert and oriented X 3. Moves all extremities spontaneously. Psych: Normal affect.  LABS:  Recent Labs  [facility] 10/20/11 0605 10/19/11 0445   WBC 6.6 8.5   HGB 15.4 14.5   HCT 43.5 41.2   MCV 90.4 90.5   PLT 216 239   Recent Labs  Basename 10/19/11 1303   DDIMER <0.22    Lab 10/19/11 0445 10/18/11 1923  NA 136 136  K 3.7 4.1  CL 104 102  CO2 21 21  BUN 11 9  CREATININE 0.83 0.77  CALCIUM 8.8 9.5  PROT -- --  BILITOT -- --  ALKPHOS -- --  ALT -- --  AST -- --  AMYLASE -- --  LIPASE -- --  GLUCOSE 110* 92   Recent Labs    Basename 10/19/11 1502 10/19/11 0445 10/19/11 0041   CKTOTAL 61 45 47   CKMB 1.3 1.3 1.3   CKMBINDEX -- -- --   TROPONINI <0.30 <0.30 <0.30   Recent Labs  Basename 10/19/11 0445   CHOL 236*   HDL 30*   LDLCALC UNABLE TO CALCULATE IF TRIGLYCERIDE OVER 400 mg/dL   TRIG 161*   CHOLHDL 7.9   LDLDIRECT --   TELE: NSR, 60-70 bpm  ECG: NSR, 71 bpm, no ST-T wave changes, Q waves II, III, aVF (unchanged from prior tracings)  Radiology/Studies:  Dg Chest 2 View  10/18/2011  *RADIOLOGY REPORT*  Clinical Data: Chest pain.  CHEST - 2 VIEW  Comparison: 05/29/2011.  Findings:  Cardiopericardial silhouette within normal limits. Mediastinal contours normal. Trachea midline.  No airspace disease or effusion.  CABG.  No interval change compared to prior. Partially radiopaque monitoring buttons are projected over the chest.  IMPRESSION: No active cardiopulmonary disease.  Stable appearance of the chest.  Original Report Authenticated By: Andreas Newport, M.D.    Current Medications:     . aspirin EC  81 mg Oral Daily  . clopidogrel  75 mg Oral Q breakfast  . diazepam  5 mg Oral On Call  . famotidine  20 mg Oral BID  . fentaNYL      . heparin      . lidocaine      . metoprolol tartrate  25 mg Oral BID  . midazolam      . midazolam      . mulitivitamin with minerals  1 tablet Oral Daily  . nitroGLYCERIN      . simvastatin  40 mg Oral q1800  . DISCONTD: aspirin  324 mg Oral NOW  . DISCONTD: aspirin  300 mg Rectal NOW  . DISCONTD: aspirin  81 mg Oral Daily  . DISCONTD: clopidogrel  75 mg Oral Q breakfast  . DISCONTD: clopidogrel  75 mg Oral Daily  . DISCONTD: metoprolol tartrate  25 mg Oral BID  . DISCONTD: simvastatin  80 mg Oral q1800  . DISCONTD: sodium chloride  3 mL Intravenous Q12H    ASSESSMENT AND PLAN:  36yo Caucasian male with PMHx significant for CAD (s/p multiple MIs requiring PCI, CABG x 4), uncontrolled hyperlipidemia, recent history of tobacco abuse and family history of  CAD who was admitted to [facility] hospital on 10/18/11 with unstable angina.  1. CAD/unstable angina- patient states chest pain is 2/10 this AM. No events overnight. Cardiac catheterization yesterday revealed largely unchanged lesions from a prior 2011 catheterization. The recommendation was made to continue medical therapy with aggressive risk reduction.   - Continue DAPT- ASA/Plavix/BB/statin/NTG SL PRN  - May consider adding CCB, long-acting nitrate to improve exercise tolerance and allow for further risk factor reduction keeping BP and cost in mind; ranolazine would likely be too expensive and considered should the aforementioned therapies be inadequate    2. Uncontrolled hyperlipidemia- TG 775, HDL 30, LDL incalculable. Patient denies abdominal, back pain. Stressed the need for continued abstinence from tobacco use and dietary modification. He states he has been eating better. There is likely a hereditary component to his hyperlipidemia.  - Continue high-dose statin  - Add fenofibrate  3. Tobacco abuse- patient has not smoked for approximately 14 days. Praised this, and stressed continued abstinence. He uses an electronic nicotine cigarette as a replacement.   4. Disposition- will discuss with MD. If medical management preferred, patient would be a likely discharge today.   Of note, he has voiced concerns to me this AM regarding unemployment, and being turned down by employers after review of an employment physical. We discussed pursuing job opportunities that may not require a significant amount of exertion, and he is reliably symptomatic after moderate exertion. He also notes concerns regarding hospital bills and financial obstacles. He states he takes his meds daily, and can afford them. He has met with CSW and a consult for a Artist.   I have personally seen and examined this patient with Hurman Horn, PA-C. I agree with the assessment and plan as outlined above. His coronary anatomy  is stable. He has disease in small caliber vessels (unchanged) and his large epicardial vessels are unchanged. Medical management. Will add Imdur.   Meliana Canner 10/20/2011 9:48 AM        Signed, R. Hurman Horn, PA-C 10/20/2011, 7:58 AM

## 2011-10-20 NOTE — Discharge Summary (Signed)
See my full note this am. cdm

## 2011-10-20 NOTE — Discharge Summary (Signed)
Discharge Summary   Patient ID: [name],  MRN: [id number], DOB/AGE: 1976-06-30 36 y.o.  Admit date: 10/18/2011 Discharge date: 10/20/2011  Discharge Diagnoses Principal Problem:  *Unstable angina Active Problems:  HYPERLIPIDEMIA-MIXED  TOBACCO USE  CAD, AUTOLOGOUS BYPASS GRAFT  CAD in native artery   Allergies Allergies  Allergen Reactions  . Codeine Itching    Diagnostic Studies/Procedures  CHEST X-RAY PA AND LATERAL- 10/18/11  Comparison: 05/29/2011.  Findings: Cardiopericardial silhouette within normal limits.  Mediastinal contours normal. Trachea midline. No airspace disease  or effusion. CABG. No interval change compared to prior.  Partially radiopaque monitoring buttons are projected over the  chest.  IMPRESSION:  No active cardiopulmonary disease. Stable appearance of the chest.  LEFT HEART CATHETERIZATION, SELECTIVE CORONARY ANGIOGRAPHY, SLIMA ANGIO, LV ANGIOGRAPHY- 10/19/11  Indication: Chest pain post CABG and post PCI  Procedural details: The right groin was prepped, draped, and anesthetized with 1% lidocaine. Using a smart needle and fluoro guided landmarks, a 5 French sheath was introduced into the right femoral artery. Standard Judkins catheters were used for coronary angiography, left ventriculography. Catheter exchanges were performed over a guidewire. There were no immediate procedural complications. The patient was transferred to the post catheterization recovery area for further monitoring. The SVGs were known to be occluded from prior studies and were not injected as such.  Procedural Findings:  Hemodynamics:  AO 110/87 (98)  LV 110/12  Coronary angiography:  Coronary dominance: right  Left mainstem: Normal without significant obstruction  Left anterior descending (LAD): Has segmental 70% stenosis leading into the diagonal. The diagonal graft is known to be occluded. There is competitive filling distally due to the IMA.  Left circumflex  (LCx): Small diffusely diseased ramus with mid long 90% occlusion. The first and second marginals are small and not critically diseased. The OM 3 has been stented and is widely patent out to the anterolateral wall. There is a 40-50% distal stenosis at a flexion point in the vessel.  The IMA to the distal LAD is intact. There is kink in the mid vessel that is well visualized, but likely does not limit flow distally.  Right coronary artery (RCA): The RCA has been previously stented in the distal portion of the mid vessel. There is about 40% smooth narrowing at this site. The PDA and PLA2 are large and without significant narrowing. The PLA 1 has a proximal 80-90% stenosis, but similar to the 2011 exam.  Left ventriculography: Left ventricular systolic function is normal, LVEF is estimated at 55-65%, there is no significant mitral regurgitation. ? Minimal inferior hypokinesis  Final Conclusions:  1. Preserved overall LV function  2. History of continued tobacco use  3. Patent IMA to the LAD with a kink that does not appear flow limiting  4. Patent stent to the OM3  5. Severe disease of the smaller ramus which has been previously bypassed with an occluded graft  6. Disease of the PLA 1.  Recommendations:  1. Review options with [name]. Leaning toward continued medical therapy, and dc of tobacco and nicotine products.   History of Present Illness  [name] is a 36yo Caucasian with PMHx significant for CAD (s/p multiple MIs requiring serial PCI, CABG x 4; detailed history noted below), uncontrolled hyperlipidemia, recent history of tobacco abuse and family history of CAD who was admitted to [facility] hospital on 10/18/11 with unstable angina.  The patient experienced an inferior MI in 2009 requiring RCA PCI and subsequent CABG. In 2010, he experienced an  in STEMI requiring PCI to the LCx. In 09/11, he underwent repeat coronary angiography, and was noted to have stable native and bypass graft anatomy,  and intervention was not performed at that time.  Since that time, he endorsed mild chronic chest pain described as dull without exertional pattern, rated at a 3/10. On 10/17/11, reportedly experienced an increase in intensity of his chest pain. He described it as substernal central chest pressure with radiation to left arm. He denied jaw or neck pain. He denied nausea, vomiting, diaphoresis, shortness of breath, paroxysmal nocturnal dyspnea, orthopnea, palpitations, lightheadedness and syncope. He also denied fevers, chills, sick contacts and medication noncompliance.   He presented to [facility] long emergency department with these complaints. Given his history and ongoing symptoms he was transferred to [facility] for further evaluation and management. In the ED, chest x-ray revealed no acute cardiopulmonary process. EKG revealed normal sinus rhythm without acute ischemic abnormalities. Point-of-care troponin I was found to be within normal limits. He was subsequently admitted to [facility] for plan repeat, diagnostic cardiac catheterization.  Hospital Course   Outpatient cardiac medications were continued. He was started on heparin. A lipid panel was ordered revealing markedly elevated triglyceride levels, LDL was in calculable and HDL was low. D-dimer was ordered and found to be within normal limits. Cardiac biomarkers were trended and found to be within normal limits.  His symptoms improved. There were no acute events overnight on telemetry her EKG. He was informed, consented and agreed to the procedure the following day which revealed the above anatomy. He tolerated the procedure well without complications. The recommendation was made to pursue medical management and aggressive risk factor reduction. There are no acute events overnight.  Today, he is stable, at baseline and will be discharged home. During his admission, he expressed concern regarding financial obstacles and unemployment.  Financial counselor was consulted by social work, and he is in meeting with them tomorrow morning regarding hospital expenses. He was advised to quit smoking. He will continue high-dose Zocor, with the addition of fenofibrate in an effort to control his markedly increased cholesterol. He will continue his other outpatient cardiac medications. He was advised to continue a heart healthy diet, and increase his activity level as tolerated. To aid with this, low-dose Imdur was added in an effort to alleviate his baseline chest pain and help with increasing his activity level. He will followup in office next week, and call if he has difficulty affording his medications. This information, including activity restrictions, has been clearly outlined in the discharge AVS.  Discharge Vitals:  Blood pressure 116/81, pulse 72, temperature 97.4 F (36.3 C), temperature source Oral, resp. rate 14, height 5\' 10"  (1.778 m), weight 122.8 kg (270 lb 11.6 oz), SpO2 99.00%.   Labs: Recent Labs  Basename 10/20/11 0605 10/19/11 0445   WBC 6.6 8.5   HGB 15.4 14.5   HCT 43.5 41.2   MCV 90.4 90.5   PLT 216 239    Recent Labs  Basename 10/19/11 1303   DDIMER <0.22    Lab 10/19/11 0445 10/18/11 1923  NA 136 136  K 3.7 4.1  CL 104 102  CO2 21 21  BUN 11 9  CREATININE 0.83 0.77  CALCIUM 8.8 9.5  PROT -- --  BILITOT -- --  ALKPHOS -- --  ALT -- --  AST -- --  AMYLASE -- --  LIPASE -- --  GLUCOSE 110* 92    Recent Labs  Basename 10/19/11 1502 10/19/11  0445 10/19/11 0041   CKTOTAL 61 45 47   CKMB 1.3 1.3 1.3   CKMBINDEX -- -- --   TROPONINI <0.30 <0.30 <0.30   No components found with this basename: POCBNP Recent Labs  Basename 10/19/11 0445   CHOL 236*   HDL 30*   LDLCALC UNABLE TO CALCULATE IF TRIGLYCERIDE OVER 400 mg/dL   TRIG 161*   CHOLHDL 7.9   LDLDIRECT --    Disposition:  Discharge Orders    Future Appointments: Provider: Department: Dept Phone: Center:   10/29/2011 3:30 PM  Kathleene Hazel, MD [facility] [phone] [facility]     Follow-up Information    Follow up with [facility], MD on 10/29/2011. (At 3:30 PM. )    Contact information:   [facility] [address] [place] [address] [phone]         Discharge Medications:  Medication List  As of 10/20/2011 10:52 AM   START taking these medications         fenofibrate 48 MG tablet   Commonly known as: TRICOR   Take 1 tablet (48 mg total) by mouth daily.      isosorbide mononitrate 30 MG 24 hr tablet   Commonly known as: IMDUR   Take 1 tablet (30 mg total) by mouth daily.         CONTINUE taking these medications         aspirin 81 MG tablet      clopidogrel 75 MG tablet   Commonly known as: PLAVIX      famotidine 20 MG tablet   Commonly known as: PEPCID   Take 1 tablet (20 mg total) by mouth 2 (two) times daily as needed for heartburn (upset stomach).      metoprolol tartrate 25 MG tablet   Commonly known as: LOPRESSOR   Take 1 tablet (25 mg total) by mouth 2 (two) times daily.      mulitivitamin with minerals Tabs      nitroGLYCERIN 0.4 MG SL tablet   Commonly known as: NITROSTAT      simvastatin 80 MG tablet   Commonly known as: ZOCOR   Take 1 tablet (80 mg total) by mouth at bedtime.          Where to get your medications    These are the prescriptions that you need to pick up. We sent them to a specific pharmacy, so you will need to go there to get them.   [facility] - [place], [place] - [address]    [address] [place] [address]    Phone: [phone]        fenofibrate 48 MG tablet   isosorbide mononitrate 30 MG 24 hr tablet   metoprolol tartrate 25 MG tablet   simvastatin 80 MG tablet           Outstanding Labs/Studies: None  Duration of Discharge Encounter: Greater than 30 minutes including physician time.  Signed, R. Hurman Horn, PA-C 10/20/2011,  10:52 AM

## 2011-10-22 MED FILL — Morphine Sulfate Inj 10 MG/ML: INTRAMUSCULAR | Qty: 1 | Status: AC

## 2011-10-29 ENCOUNTER — Encounter: Payer: Self-pay | Admitting: Cardiovascular Disease

## 2011-10-29 ENCOUNTER — Ambulatory Visit (INDEPENDENT_AMBULATORY_CARE_PROVIDER_SITE_OTHER): Payer: Self-pay | Admitting: Cardiovascular Disease

## 2011-10-29 VITALS — BP 134/88 | HR 88 | Ht 71.0 in | Wt 273.0 lb

## 2011-10-29 DIAGNOSIS — I2510 Atherosclerotic heart disease of native coronary artery without angina pectoris: Secondary | ICD-10-CM | POA: Insufficient documentation

## 2011-10-29 DIAGNOSIS — E785 Hyperlipidemia, unspecified: Secondary | ICD-10-CM

## 2011-10-29 MED ORDER — METOPROLOL TARTRATE 25 MG PO TABS: 25.0000 mg | ORAL_TABLET | Freq: Two times a day (BID) | ORAL | Status: DC

## 2011-10-29 MED ORDER — ALPRAZOLAM 0.5 MG PO TABS: ORAL_TABLET | ORAL | Status: DC

## 2011-10-29 MED ORDER — SIMVASTATIN 80 MG PO TABS: 80.0000 mg | ORAL_TABLET | Freq: Every day | ORAL | Status: DC

## 2011-10-29 NOTE — Assessment & Plan Note (Signed)
Continue statin. Repeat lipids and LFTs at next visit.

## 2011-10-29 NOTE — Patient Instructions (Addendum)
Your physician recommends that you schedule a follow-up appointment in: 3 months --February 09, 2012 at 4:15

## 2011-10-29 NOTE — Assessment & Plan Note (Addendum)
He has severe CAD with no focal lesions amenable to PCI per Dr. Riley Kill on recent cath. He is tolerating his beta blocker, ASA, Plavix and statin. He was started on Imdur in the hospital but cannot afford this medication. He has not been taking Imdur. He continues to have daily chest pains, with constant fatigue and SOB. It is very likely that his symptoms are cardiac related but there are no good interventional options at this time. I do not think redo bypass would be beneficial. He cannot afford Ranexa or Imdur. Will try to get samples of Ranexa if possible. He is applying for disability at this time and I think this is reasonable. BP well controlled. F/U with me 3 months. We will give him Xanax to use prn.

## 2011-10-29 NOTE — Progress Notes (Signed)
History of Present Illness: [name] is a 36 year old gentleman with CAD with serial PCI and a 4-vessel CABG in 2009, HTN, HLD, tobacco abuse who is here today for cardiac follow up. He was admitted to [facility] Department with chest pain on 10/19/11 and transferred to [facility] for cath per Dr. Riley Kill. Cardiac cath on 10/19/11 as below with stable CAD. He was discharged home on Imdur. His coronary history began in 2009 with an inferior MI requiring RCA PCI and subsequent CABG with anatomy as described below. He then presented in 2010 with an NSTEMI in 2010 requiring LCx PCI. He underwent repeat coronary angiography in September of 2011 and was found to have stable native and bypass graft anatomy not requiring any intervention at the time.  Since going home, he continues to have daily chest pain with SOB and fatigue. He is unable to perform any tasks that require moderate exertion without symptoms. No dizziness, near syncope or syncope. He could not afford Imdur so he did not start as advised. Taking other meds as below.   Primary Care Physician: none  Past Medical History  Diagnosis Date  . Hypertension   . MI (myocardial infarction)   . Hx of CABG   . Heart attack   . Bronchitis   . Tobacco user   . Hyperlipidemia     Past Surgical History  Procedure Date  . Coronary artery bypass graft     status post diaphragmatic wall infarction Rx BMS RCA 03-11-08   . Orthopedic surgery     Current Outpatient Prescriptions  Medication Sig Dispense Refill  . aspirin 81 MG tablet Take 81 mg by mouth daily.        . clopidogrel (PLAVIX) 75 MG tablet Take 75 mg by mouth daily.       . famotidine (PEPCID) 20 MG tablet Take 1 tablet (20 mg total) by mouth 2 (two) times daily as needed for heartburn (upset stomach).  12 tablet  0  . metoprolol tartrate (LOPRESSOR) 25 MG tablet Take 1 tablet (25 mg total) by mouth 2 (two) times daily.  60 tablet  6  . Multiple Vitamin (MULITIVITAMIN WITH  MINERALS) TABS Take 1 tablet by mouth daily.      . nitroGLYCERIN (NITROSTAT) 0.4 MG SL tablet Place 0.4 mg under the tongue every 5 (five) minutes as needed.        . simvastatin (ZOCOR) 80 MG tablet Take 1 tablet (80 mg total) by mouth at bedtime.  30 tablet  6  . DISCONTD: metoprolol tartrate (LOPRESSOR) 25 MG tablet Take 1 tablet (25 mg total) by mouth 2 (two) times daily.  60 tablet  2  . DISCONTD: simvastatin (ZOCOR) 80 MG tablet Take 1 tablet (80 mg total) by mouth at bedtime.  30 tablet  3  . ALPRAZolam (XANAX) 0.5 MG tablet Take one tablet by mouth three times daily as needed for anxiety  60 tablet  2    Allergies  Allergen Reactions  . Codeine Itching    History   Social History  . Marital Status: Divorced    Spouse Name: N/A    Number of Children: N/A  . Years of Education: N/A   Occupational History  . Not on file.   Social History Main Topics  . Smoking status: Former Smoker -- 0.5 packs/day    Types: Cigarettes    Quit date: 10/02/2011  . Smokeless tobacco: Never Used  . Alcohol Use: No  . Drug  Use: No  . Sexually Active: Not on file   Other Topics Concern  . Not on file   Social History Narrative  . No narrative on file    No family history on file.  Review of Systems:  As stated in the HPI and otherwise negative.   BP 134/88  Pulse 88  Ht 5\' 11"  (1.803 m)  Wt 273 lb (123.832 kg)  BMI 38.08 kg/m2  Physical Examination: General: Well developed, well nourished, NAD HEENT: OP clear, mucus membranes moist SKIN: warm, dry. No rashes. Neuro: No focal deficits Musculoskeletal: Muscle strength 5/5 all ext Psychiatric: Mood and affect normal Neck: No JVD, no carotid bruits, no thyromegaly, no lymphadenopathy. Lungs:Clear bilaterally, no wheezes, rhonci, crackles Cardiovascular: Regular rate and rhythm. No murmurs, gallops or rubs. Abdomen:Soft. Bowel sounds present. Non-tender.  Extremities: No lower extremity edema. Pulses are 2 + in the bilateral  DP/PT.  Cardiac cath 10/19/11:  Left mainstem: Normal without significant obstruction  Left anterior descending (LAD): Has segmental 70% stenosis leading into the diagonal. The diagonal graft is known to be occluded. There is competitive filling distally due to the IMA.  Left circumflex (LCx): Small diffusely diseased ramus with mid long 90% occlusion. The first and second marginals are small and not critically diseased. The OM 3 has been stented and is widely patent out to the anterolateral wall. There is a 40-50% distal stenosis at a flexion point in the vessel.  The IMA to the distal LAD is intact. There is kink in the mid vessel that is well visualized, but likely does not limit flow distally.  Right coronary artery (RCA): The RCA has been previously stented in the distal portion of the mid vessel. There is about 40% smooth narrowing at this site. The PDA and PLA2 are large and without significant narrowing. The PLA 1 has a proximal 80-90% stenosis, but similar to the 2011 exam.  Left ventriculography: Left ventricular systolic function is normal, LVEF is estimated at 55-65%, there is no significant mitral regurgitation. ? Minimal inferior hypokinesis  Final Conclusions:  1. Preserved overall LV function  2. History of continued tobacco use  3. Patent IMA to the LAD with a kink that does not appear flow limiting  4. Patent stent to the OM3  5. Severe disease of the smaller ramus which has been previously bypassed with an occluded graft  6. Disease of the PLA 1.

## 2011-11-09 ENCOUNTER — Telehealth: Payer: Self-pay | Admitting: Cardiovascular Disease

## 2011-11-09 DIAGNOSIS — I2510 Atherosclerotic heart disease of native coronary artery without angina pectoris: Secondary | ICD-10-CM

## 2011-11-09 MED ORDER — NITROGLYCERIN 0.4 MG SL SUBL: 0.4000 mg | SUBLINGUAL_TABLET | SUBLINGUAL | Status: DC | PRN

## 2011-11-09 NOTE — Telephone Encounter (Signed)
  Called patient and discussed his medications. He is not taking any long acting nitrates because of cost. He states that he takes NTG s/l about every other day. We have Ranexa samples but need to find out what dose Dr.McAlhany would like the patient to take. Advised patient that we will discuss with Dr.McAlhany tomorrow and that Dennie Bible would follow up with him.

## 2011-11-09 NOTE — Telephone Encounter (Signed)
Pt was told by dr Clifton James that he would save him samples of a med to help with his chest pain, and hasn't heard anything yet, pls call

## 2011-11-09 NOTE — Telephone Encounter (Signed)
Rx refilled.

## 2011-11-09 NOTE — Telephone Encounter (Signed)
Nitro tablets, uses [facility], pt out

## 2011-11-10 ENCOUNTER — Other Ambulatory Visit: Payer: Self-pay | Admitting: *Deleted

## 2011-11-10 MED ORDER — RANOLAZINE ER 500 MG PO TB12: 500.0000 mg | ORAL_TABLET | Freq: Two times a day (BID) | ORAL | Status: DC

## 2011-11-10 MED ORDER — FENOFIBRATE 48 MG PO TABS: 48.0000 mg | ORAL_TABLET | Freq: Every day | ORAL | Status: DC

## 2011-11-10 MED ORDER — ISOSORBIDE MONONITRATE ER 30 MG PO TB24: 30.0000 mg | ORAL_TABLET | Freq: Every day | ORAL | Status: DC

## 2011-11-10 NOTE — Telephone Encounter (Signed)
Pt rtn call to pat, pls call [phone]

## 2011-11-10 NOTE — Telephone Encounter (Signed)
Spoke with pt and told him I would leave Ranexa samples and application for assistance program at front desk.  He will let us know if Ranexa helps and if so he will complete application and return to office.  Ranexa 500 mg --42 tablets- lot [id number]-exp 3/16 left at desk.  Pt verbalizes understanding of instructions to take Ranexa twice daily about 12 hours apart

## 2011-11-10 NOTE — Telephone Encounter (Signed)
Left message to call back  

## 2011-11-10 NOTE — Telephone Encounter (Signed)
Pat, Can we give him Ranexa samples for 500 mg po BID. If he tries and it helps, we can have him apply for the assistance program. Thanks, chris

## 2011-11-11 ENCOUNTER — Other Ambulatory Visit: Payer: Self-pay | Admitting: *Deleted

## 2011-11-12 ENCOUNTER — Telehealth: Payer: Self-pay | Admitting: *Deleted

## 2011-11-12 NOTE — Telephone Encounter (Signed)
Called pt to get more information to complete Ranexa assistance program paperwork. Left message to call back

## 2011-11-17 NOTE — Telephone Encounter (Signed)
Left message to call back  

## 2011-11-18 ENCOUNTER — Telehealth: Payer: Self-pay | Admitting: Cardiovascular Disease

## 2011-11-18 NOTE — Telephone Encounter (Signed)
Pt will anticipate a return call from [place] on Thursday.

## 2011-11-18 NOTE — Telephone Encounter (Signed)
PT RTN CALL FROM PAT FROM FRIDAY

## 2011-11-19 NOTE — Telephone Encounter (Signed)
Spoke with pt and needed information received to complete paperwork. Pt states Ranexa has helped his chest pain. Will fax paperwork today to assistance program.

## 2011-11-27 ENCOUNTER — Telehealth: Payer: Self-pay | Admitting: Cardiovascular Disease

## 2011-11-27 NOTE — Telephone Encounter (Signed)
Samples out front.  Patient aware 

## 2011-11-27 NOTE — Telephone Encounter (Signed)
New Problem:     Patient called in wanting to know if we had any samples of ranolazine (RANEXA) 500 MG 12 hr tablet available for him to have.  Please call back.

## 2011-12-01 ENCOUNTER — Other Ambulatory Visit: Payer: Self-pay | Admitting: *Deleted

## 2011-12-01 DIAGNOSIS — I2510 Atherosclerotic heart disease of native coronary artery without angina pectoris: Secondary | ICD-10-CM

## 2011-12-01 NOTE — Telephone Encounter (Signed)
Opened in Error.

## 2011-12-03 NOTE — Telephone Encounter (Signed)
Received fax that pt had been approved for Ranexa Assistance program through 02/22/2012. I called to give pt this information and left message to call back

## 2011-12-04 ENCOUNTER — Telehealth: Payer: Self-pay | Admitting: Cardiovascular Disease

## 2011-12-04 NOTE — Telephone Encounter (Signed)
Left message to call back  

## 2011-12-04 NOTE — Telephone Encounter (Signed)
New Problem:    Patient called in because he received a confirmation letter saying that he is eligible for financial assistance for his Ranexa and has some paperwork he needs to sign and was wondering if he needed to bring that paperwork into the office.  Please call back.

## 2011-12-08 NOTE — Telephone Encounter (Signed)
Spoke with pt and told him he had been approved for Ranexa assistance program through February 22, 2012 and that samples would be sent to his home address.  I had spoken with Ranexa assistance program about extending enrollment if pt did not receive medicaid assistance (pt in process of enrolling for this) and was told this could be evaluated again in June if needed.  Pt will let me know in June if he has not heard from [facility]. He will also let me know if he has any medication assistance changes.   Pt states he received paperwork requiring his signature for assistance. He will complete and bring to office.  I confirmed with pt that he is not taking Imdur.

## 2011-12-08 NOTE — Telephone Encounter (Signed)
Fu msg Patient returning you call

## 2011-12-08 NOTE — Telephone Encounter (Signed)
Left message to call back  

## 2011-12-21 ENCOUNTER — Telehealth: Payer: Self-pay | Admitting: Cardiovascular Disease

## 2011-12-21 NOTE — Telephone Encounter (Signed)
New msg Pt has some questions about his meds. He forgot which of his meds to stop taking

## 2011-12-21 NOTE — Telephone Encounter (Signed)
Spoke with pt and told him he should not be taking Imdur. He has not been taking due to cost.

## 2012-01-14 ENCOUNTER — Other Ambulatory Visit: Payer: Self-pay | Admitting: Cardiovascular Disease

## 2012-01-14 DIAGNOSIS — I2510 Atherosclerotic heart disease of native coronary artery without angina pectoris: Secondary | ICD-10-CM

## 2012-01-15 MED ORDER — ALPRAZOLAM 0.5 MG PO TABS: ORAL_TABLET | ORAL | Status: DC

## 2012-01-15 NOTE — Telephone Encounter (Signed)
F/u   Patient calling for f/u status on med refill, as he has not received it. Please research and return call to patient hm#  to advise.

## 2012-01-15 NOTE — Telephone Encounter (Signed)
Spoke with pt and told him I would contact [facility] on [address] today with refill for Xanax.  Pt reports he has been approved for Medicaid.  He will check with pharmacy to make sure Ranexa will be covered as his eligibility for Ranexa assistance program was through August pending Medicaid approval.

## 2012-02-09 ENCOUNTER — Ambulatory Visit (INDEPENDENT_AMBULATORY_CARE_PROVIDER_SITE_OTHER): Payer: Medicaid Other | Admitting: Cardiovascular Disease

## 2012-02-09 ENCOUNTER — Encounter: Payer: Self-pay | Admitting: Cardiovascular Disease

## 2012-02-09 VITALS — BP 150/99 | HR 86 | Ht 71.0 in | Wt 282.0 lb

## 2012-02-09 DIAGNOSIS — I10 Essential (primary) hypertension: Secondary | ICD-10-CM

## 2012-02-09 DIAGNOSIS — I2510 Atherosclerotic heart disease of native coronary artery without angina pectoris: Secondary | ICD-10-CM

## 2012-02-09 DIAGNOSIS — F17200 Nicotine dependence, unspecified, uncomplicated: Secondary | ICD-10-CM

## 2012-02-09 DIAGNOSIS — E785 Hyperlipidemia, unspecified: Secondary | ICD-10-CM

## 2012-02-09 MED ORDER — VARENICLINE TARTRATE 0.5 MG X 11 & 1 MG X 42 PO MISC: ORAL | Status: AC

## 2012-02-09 MED ORDER — LOSARTAN POTASSIUM 50 MG PO TABS: 50.0000 mg | ORAL_TABLET | Freq: Every day | ORAL | Status: DC

## 2012-02-09 NOTE — Assessment & Plan Note (Signed)
BP is elevated. Will add Cozaar 50 mg po BID.

## 2012-02-09 NOTE — Progress Notes (Signed)
History of Present Illness: [name] is a 36 year old gentleman with history of CAD with serial PCI and a 4-vessel CABG in 2009, HTN, HLD, tobacco abuse who is here today for cardiac follow up. He was admitted to [facility] Emergency Department with chest pain on 10/19/11 and transferred to [facility] for cath per Dr. Riley Kill. Cardiac cath on 10/19/11 as below with stable CAD, bypasses. He was discharged home on Imdur. His coronary history began in 2009 with an inferior MI requiring RCA PCI and subsequent CABG with anatomy as described below. He then presented in 2010 with an NSTEMI in 2010 requiring LCx PCI. He underwent repeat coronary angiography in September of 2011 and was found to have stable native and bypass graft anatomy not requiring any intervention at the time. As above, last cath in April 2013 with stable disease. Started on Ranexa in April 2013. ( in assistance program)     He is here today for cardiac follow up. He tells me that he has started back smoking. His chest pain is much better controlled. He has good days and bad days. His bad days are much less bad than they had been in the past. He thinks the Ranexa has helped. He wants to find a PCP. No SOB. No palpitations.   Primary Care Physician: None  Last Lipid Profile:  Lipid Panel     Component Value Date/Time   CHOL 236* 10/19/2011 0445   TRIG 775* 10/19/2011 0445   HDL 30* 10/19/2011 0445   CHOLHDL 7.9 10/19/2011 0445   VLDL UNABLE TO CALCULATE IF TRIGLYCERIDE OVER 400 mg/dL 07/25/1094 0454   LDLCALC UNABLE TO CALCULATE IF TRIGLYCERIDE OVER 400 mg/dL 0/03/8118 1478     Past Medical History  Diagnosis Date  . Hypertension   . MI (myocardial infarction)   . Hx of CABG   . Heart attack   . Bronchitis   . Tobacco user   . Hyperlipidemia     Past Surgical History  Procedure Date  . Coronary artery bypass graft     status post diaphragmatic wall infarction Rx BMS RCA 03-11-08   . Orthopedic surgery     Current Outpatient  Prescriptions  Medication Sig Dispense Refill  . ALPRAZolam (XANAX) 0.5 MG tablet Take one tablet by mouth three times daily as needed for anxiety  60 tablet  2  . aspirin 81 MG tablet Take 81 mg by mouth daily.        . clopidogrel (PLAVIX) 75 MG tablet Take 75 mg by mouth daily.       . famotidine (PEPCID) 20 MG tablet Take 1 tablet (20 mg total) by mouth 2 (two) times daily as needed for heartburn (upset stomach).  12 tablet  0  . fenofibrate (TRICOR) 48 MG tablet Take 1 tablet (48 mg total) by mouth daily.  30 tablet  11  . metoprolol tartrate (LOPRESSOR) 25 MG tablet Take 1 tablet (25 mg total) by mouth 2 (two) times daily.  60 tablet  6  . Multiple Vitamin (MULITIVITAMIN WITH MINERALS) TABS Take 1 tablet by mouth daily.      . nitroGLYCERIN (NITROSTAT) 0.4 MG SL tablet Place 1 tablet (0.4 mg total) under the tongue every 5 (five) minutes as needed.  25 tablet  11  . omeprazole (PRILOSEC) 20 MG capsule 1 TAB DAILY      . ranolazine (RANEXA) 500 MG 12 hr tablet Take 1 tablet (500 mg total) by mouth 2 (two) times daily.  60 tablet  6  . simvastatin (ZOCOR) 80 MG tablet Take 1 tablet (80 mg total) by mouth at bedtime.  30 tablet  6    Allergies  Allergen Reactions  . Codeine Itching    History   Social History  . Marital Status: Divorced    Spouse Name: N/A    Number of Children: N/A  . Years of Education: N/A   Occupational History  . Not on file.   Social History Main Topics  . Smoking status: Current Everyday Smoker -- 0.5 packs/day    Types: Cigarettes    Last Attempt to Quit: 10/02/2011  . Smokeless tobacco: Never Used  . Alcohol Use: No  . Drug Use: No  . Sexually Active: Not on file   Other Topics Concern  . Not on file   Social History Narrative  . No narrative on file    No family history on file.  Review of Systems:  As stated in the HPI and otherwise negative.   BP 150/99  Pulse 86  Ht 5\' 11"  (1.803 m)  Wt 282 lb (127.914 kg)  BMI 39.33  kg/m2  Physical Examination: General: Well developed, well nourished, NAD HEENT: OP clear, mucus membranes moist SKIN: warm, dry. No rashes. Neuro: No focal deficits Musculoskeletal: Muscle strength 5/5 all ext Psychiatric: Mood and affect normal Neck: No JVD, no carotid bruits, no thyromegaly, no lymphadenopathy. Lungs:Clear bilaterally, no wheezes, rhonci, crackles Cardiovascular: Regular rate and rhythm. No murmurs, gallops or rubs. Abdomen:Soft. Bowel sounds present. Non-tender.  Extremities: No lower extremity edema. Pulses are 2 + in the bilateral DP/PT.   Cardiac cath 10/19/11 per Dr. Riley Kill:  Left mainstem: Normal without significant obstruction  Left anterior descending (LAD): Has segmental 70% stenosis leading into the diagonal. The diagonal graft is known to be occluded. There is competitive filling distally due to the IMA.  Left circumflex (LCx): Small diffusely diseased ramus with mid long 90% occlusion. The first and second marginals are small and not critically diseased. The OM 3 has been stented and is widely patent out to the anterolateral wall. There is a 40-50% distal stenosis at a flexion point in the vessel.  The IMA to the distal LAD is intact. There is kink in the mid vessel that is well visualized, but likely does not limit flow distally.  Right coronary artery (RCA): The RCA has been previously stented in the distal portion of the mid vessel. There is about 40% smooth narrowing at this site. The PDA and PLA2 are large and without significant narrowing. The PLA 1 has a proximal 80-90% stenosis, but similar to the 2011 exam.  Left ventriculography: Left ventricular systolic function is normal, LVEF is estimated at 55-65%, there is no significant mitral regurgitation. ? Minimal inferior hypokinesis

## 2012-02-09 NOTE — Assessment & Plan Note (Signed)
He wishes to stop. Will try Chantix trial.

## 2012-02-09 NOTE — Assessment & Plan Note (Signed)
Continue statin. Check lipids/LFTs in 4 weeks.

## 2012-02-09 NOTE — Assessment & Plan Note (Signed)
Stable. Cath April 2013 as above with stable severe disease s/p CABG. He is doing well on Ranexa. Will continue ASA, statin, beta blocker. Will add Cozaar 50 mg po BID for better BP control. Will continue statin and will get repeat LFTs and lipids in 4 weeks.

## 2012-02-09 NOTE — Patient Instructions (Addendum)
Your physician recommends that you schedule a follow-up appointment in: 6 months.   Your physician recommends that you return for lab work in: fasting lipid and liver  Your physician has recommended you make the following change in your medication: Start Chantix as directed and start Losartan 50mg  daily

## 2012-02-29 ENCOUNTER — Other Ambulatory Visit (INDEPENDENT_AMBULATORY_CARE_PROVIDER_SITE_OTHER): Payer: Medicaid Other

## 2012-02-29 DIAGNOSIS — E785 Hyperlipidemia, unspecified: Secondary | ICD-10-CM

## 2012-02-29 LAB — HEPATIC FUNCTION PANEL
ALT: 23 U/L (ref 0–53)
AST: 22 U/L (ref 0–37)
Albumin: 4.2 g/dL (ref 3.5–5.2)
Total Protein: 7.6 g/dL (ref 6.0–8.3)

## 2012-02-29 LAB — LDL CHOLESTEROL, DIRECT: Direct LDL: 117 mg/dL

## 2012-02-29 LAB — LIPID PANEL
Cholesterol: 250 mg/dL — ABNORMAL HIGH (ref 0–200)
Total CHOL/HDL Ratio: 6
Triglycerides: 489 mg/dL — ABNORMAL HIGH (ref 0.0–149.0)

## 2012-03-03 ENCOUNTER — Other Ambulatory Visit: Payer: Self-pay | Admitting: Cardiovascular Disease

## 2012-03-03 ENCOUNTER — Other Ambulatory Visit: Payer: Self-pay | Admitting: *Deleted

## 2012-03-03 DIAGNOSIS — E785 Hyperlipidemia, unspecified: Secondary | ICD-10-CM

## 2012-03-03 MED ORDER — ATORVASTATIN CALCIUM 80 MG PO TABS: 80.0000 mg | ORAL_TABLET | Freq: Every day | ORAL | Status: DC

## 2012-03-03 NOTE — Telephone Encounter (Signed)
He should stop Zocor and start atorvastatin. Thanks, chris

## 2012-03-03 NOTE — Telephone Encounter (Signed)
Patient returning nurse call, he can be reached at [phone]

## 2012-03-03 NOTE — Telephone Encounter (Signed)
Fu call Pt wants to know should he quit taking any of his current meds or just add to them

## 2012-03-03 NOTE — Telephone Encounter (Signed)
Spoke to pt and gave him results of L/L--called in RX to [facility] on [address]--advised to take atorvastatin 80mg  1 tab qpm--# 30 with 2 refills--advised pt he would need repeat L/L IN --advised that  [name] would call and remind him to have blood work done in 3 months

## 2012-03-03 NOTE — Telephone Encounter (Signed)
[name] calling back to be sure he can stop simvastatin--advised to stop simvastatin and reminded him i had called in atorvastatin 30 tabs with 2 refills to last until he has his next visit with dr Loura Halt scheduled--pt agrees

## 2012-03-04 MED ORDER — FENOFIBRATE 54 MG PO TABS: 54.0000 mg | ORAL_TABLET | Freq: Every day | ORAL | Status: DC

## 2012-03-04 NOTE — Telephone Encounter (Signed)
We received prior auth form in office for Tricor. I spoke with rep at [facility] and this dose requires prior auth. Reviewed with Weston Brass, PharmD and alternative would be fenofibrate 54 mg.  I spoke with pt and gave him this information and he would like to try fenofibrate 54 mg daily. He has already stopped simvastatin and started Atorvastatin. I will send prescription for fenofibrate to [facility] on [address]. Pt will come in May 24, 2012 for fasting lipid and liver profile.

## 2012-03-04 NOTE — Telephone Encounter (Signed)
F/u   Pt calling to refill TriCore Rx, noted this in file just in case it affects previous request

## 2012-03-09 ENCOUNTER — Encounter: Payer: Self-pay | Admitting: Family Medicine

## 2012-03-09 ENCOUNTER — Ambulatory Visit (INDEPENDENT_AMBULATORY_CARE_PROVIDER_SITE_OTHER): Payer: Medicaid Other | Admitting: Family Medicine

## 2012-03-09 VITALS — BP 122/81 | HR 75 | Temp 98.2°F | Ht 71.0 in | Wt 280.2 lb

## 2012-03-09 DIAGNOSIS — I10 Essential (primary) hypertension: Secondary | ICD-10-CM

## 2012-03-09 DIAGNOSIS — F411 Generalized anxiety disorder: Secondary | ICD-10-CM

## 2012-03-09 DIAGNOSIS — F17200 Nicotine dependence, unspecified, uncomplicated: Secondary | ICD-10-CM

## 2012-03-09 DIAGNOSIS — F419 Anxiety disorder, unspecified: Secondary | ICD-10-CM

## 2012-03-09 DIAGNOSIS — E669 Obesity, unspecified: Secondary | ICD-10-CM

## 2012-03-09 DIAGNOSIS — E66812 Obesity, class 2: Secondary | ICD-10-CM

## 2012-03-09 MED ORDER — CLONAZEPAM 1 MG PO TABS: 1.0000 mg | ORAL_TABLET | Freq: Two times a day (BID) | ORAL | Status: DC | PRN

## 2012-03-09 MED ORDER — ONDANSETRON HCL 4 MG PO TABS: 4.0000 mg | ORAL_TABLET | Freq: Three times a day (TID) | ORAL | Status: AC

## 2012-03-09 MED ORDER — CLOPIDOGREL BISULFATE 75 MG PO TABS: 75.0000 mg | ORAL_TABLET | Freq: Every day | ORAL | Status: DC

## 2012-03-09 MED ORDER — CITALOPRAM HYDROBROMIDE 20 MG PO TABS: 20.0000 mg | ORAL_TABLET | Freq: Every day | ORAL | Status: DC

## 2012-03-09 NOTE — Progress Notes (Signed)
Patient ID: [name], male   DOB: [birth year], 36 y.o.   MRN: [id number] Subjective: The patient is a 36 y.o. year old male who presents today for new patient appointment.  1. CAD: Patient has early onset CAD and is s/p 4 vessel CABG.  Multiple family members have had early death related to heart disease.  He is followed by cardiology.  He is trying to find out if plavix is available as a generic as he is on his last bottle of free medicine through the company.  2. HLD: Patient has diagnosis of HLD.  Likely is familial given extreme elevation of triglycerides despite fairly good dietary compliance and medication.  3. Anxiety: Patient has been having significant problems with anxiety, generalized, recently.  He is concerned about the possibility of his own death and finds himself fixating on this.  He has been given xanax previously.  4. Smoking: Patient has previously been on Chantix and said it helped with his smoking, although he continued to smoke despite taking the medicine.  He asks if it is possible to get a new prescription for the medicine.  He continues to smoke today.  Patient's past medical, social, and family history were reviewed and updated as appropriate. History  Substance Use Topics  . Smoking status: Current Everyday Smoker -- 0.5 packs/day    Types: Cigarettes    Last Attempt to Quit: 10/02/2011  . Smokeless tobacco: Never Used  . Alcohol Use: No   Objective:  Filed Vitals:   03/09/12 1558  BP: 122/81  Pulse: 75  Temp: 98.2 F (36.8 C)   Gen: NAD, obese CV: RRR, no murmurs Resp: CTABL Chest: Well healed sternotomy Ext: No edema, 2+ pulses  Assessment/Plan:  Please also see individual problems in problem list for problem-specific plans.

## 2012-03-09 NOTE — Patient Instructions (Signed)
It was great to meet you today! I want you to stop taking your Xanax and start taking Klonipin two times daily for your anxiety.  We are also starting the medication Celexa for your anxiety.  Plan on coming back to see me in 3-4 weeks to talk about how things are going. Make an appointment with Dr. Raymondo Band, our pharmacist, on your way out to discuss smoking cessation. I am giving you a prescription for Plavix.  Take it with you to the pharmacy and ask how much it will cost to have it filled.  The [facility] has it available as a generic and so should other pharmacies. I am giving you zofran to take three times daily before meals to see if it helps with your nausea.

## 2012-03-16 ENCOUNTER — Telehealth: Payer: Self-pay | Admitting: Family Medicine

## 2012-03-16 NOTE — Telephone Encounter (Signed)
Patient to stop taking celexa and make appointment in next week or so to discuss other options and to see how he is doing otherwise.  I LVM for him to return call.

## 2012-03-16 NOTE — Telephone Encounter (Signed)
Patient have ceased taking the citalopram due to allergic rx to med.  Having skin outbreak,redness and irritation.  Would like to to speak with provider regarding this

## 2012-03-17 NOTE — Telephone Encounter (Signed)
LVM for patient to call back. ?

## 2012-03-18 ENCOUNTER — Encounter: Payer: Self-pay | Admitting: Family Medicine

## 2012-03-18 DIAGNOSIS — F419 Anxiety disorder, unspecified: Secondary | ICD-10-CM | POA: Insufficient documentation

## 2012-03-18 DIAGNOSIS — E669 Obesity, unspecified: Secondary | ICD-10-CM | POA: Insufficient documentation

## 2012-03-18 NOTE — Assessment & Plan Note (Signed)
Change from Xanax to Klonopin.  Will start SSRI and f/u in 3-4 weeks to monitor for effect.

## 2012-03-18 NOTE — Telephone Encounter (Signed)
Left message to call us back

## 2012-03-18 NOTE — Assessment & Plan Note (Signed)
Smoking while on Chantix is not ideal.  Will rec patient make appointment with pharmacist for discussion of smoking cessation.

## 2012-03-18 NOTE — Assessment & Plan Note (Signed)
Well controlled today, no changes in management.

## 2012-03-22 NOTE — Telephone Encounter (Signed)
[name] for pt to return call and reminded him of his appt on 9.13.2013 @ 415 pm.[name], [place]

## 2012-03-24 NOTE — Telephone Encounter (Signed)
[name] for pt to return call and remind him of his appt on 04/01/2012 @ 4:15pm

## 2012-04-01 ENCOUNTER — Ambulatory Visit: Payer: Medicaid Other | Admitting: Family Medicine

## 2012-04-14 ENCOUNTER — Ambulatory Visit: Payer: Medicaid Other | Admitting: Family Medicine

## 2012-05-02 ENCOUNTER — Telehealth: Payer: Self-pay | Admitting: Cardiovascular Disease

## 2012-05-02 DIAGNOSIS — E785 Hyperlipidemia, unspecified: Secondary | ICD-10-CM

## 2012-05-02 MED ORDER — FENOFIBRATE 48 MG PO TABS: 48.0000 mg | ORAL_TABLET | Freq: Every day | ORAL | Status: DC

## 2012-05-02 NOTE — Telephone Encounter (Signed)
plz return call to patient [phone] regarding issues with medication .

## 2012-05-02 NOTE — Telephone Encounter (Signed)
Received call from [facility] that Tricor 48 mg --brand name only--will be covered by Medicaid.  [facility] will discontinue fenofibrate and fill Tricor 48 mg by mouth daily. Per [facility] pharmacist pt was just in store and is aware of changes.  I left message on pt's voicemail to call back if any questions.

## 2012-05-02 NOTE — Telephone Encounter (Signed)
Medicaid medicine coverage was verified with Inetta Fermo at [facility]

## 2012-05-02 NOTE — Telephone Encounter (Signed)
Spoke with pt. He is having trouble getting fenofibrate 54 mg from [facility]. He states pharmacy is currently out of this medication. He also states last month he had to pay full price for this medication because it was not covered by Medicaid. He states the previous month it was covered.  I told pt I would check on this and call him back.

## 2012-05-02 NOTE — Telephone Encounter (Signed)
[place] Medicaid Drug list reviewed. Tricor is on preferred list.  Per Weston Brass, PharmD equivalent dose is Tricor 48 mg. I called [facility] and they ran Tricor 48 mg through [facility] and received message that prior authorization is required.  I instructed [facility] to keep pt on fenofibrate 54 mg. Will contact Medicaid to see what medication is covered.

## 2012-05-02 NOTE — Telephone Encounter (Signed)
Spoke with pharmacist at [facility] who states they do have fenofibrate in stock at present time. It is requiring prior authorization. Per pharmacist fenofibrate was covered at cost of $3 to pt in August but is now requiring prior authorization.

## 2012-05-10 ENCOUNTER — Ambulatory Visit: Payer: Medicaid Other | Admitting: Family Medicine

## 2012-05-17 ENCOUNTER — Other Ambulatory Visit: Payer: Self-pay | Admitting: Cardiovascular Disease

## 2012-05-19 MED ORDER — ATORVASTATIN CALCIUM 80 MG PO TABS: 80.0000 mg | ORAL_TABLET | Freq: Every day | ORAL | Status: DC

## 2012-05-24 ENCOUNTER — Other Ambulatory Visit: Payer: Medicaid Other

## 2012-06-09 ENCOUNTER — Other Ambulatory Visit: Payer: Self-pay | Admitting: Cardiovascular Disease

## 2012-06-10 MED ORDER — ATORVASTATIN CALCIUM 80 MG PO TABS: 80.0000 mg | ORAL_TABLET | Freq: Every day | ORAL | Status: DC

## 2012-06-15 ENCOUNTER — Ambulatory Visit (INDEPENDENT_AMBULATORY_CARE_PROVIDER_SITE_OTHER): Payer: Medicaid Other | Admitting: Family Medicine

## 2012-06-15 ENCOUNTER — Telehealth: Payer: Self-pay | Admitting: Cardiovascular Disease

## 2012-06-15 ENCOUNTER — Other Ambulatory Visit: Payer: Self-pay | Admitting: Cardiovascular Disease

## 2012-06-15 ENCOUNTER — Other Ambulatory Visit (INDEPENDENT_AMBULATORY_CARE_PROVIDER_SITE_OTHER): Payer: Medicaid Other

## 2012-06-15 ENCOUNTER — Encounter: Payer: Self-pay | Admitting: Family Medicine

## 2012-06-15 VITALS — BP 120/82 | HR 90 | Temp 98.1°F | Ht 71.0 in | Wt 276.4 lb

## 2012-06-15 DIAGNOSIS — F419 Anxiety disorder, unspecified: Secondary | ICD-10-CM

## 2012-06-15 DIAGNOSIS — F411 Generalized anxiety disorder: Secondary | ICD-10-CM

## 2012-06-15 DIAGNOSIS — I2510 Atherosclerotic heart disease of native coronary artery without angina pectoris: Secondary | ICD-10-CM

## 2012-06-15 DIAGNOSIS — E785 Hyperlipidemia, unspecified: Secondary | ICD-10-CM

## 2012-06-15 DIAGNOSIS — E669 Obesity, unspecified: Secondary | ICD-10-CM

## 2012-06-15 DIAGNOSIS — I10 Essential (primary) hypertension: Secondary | ICD-10-CM

## 2012-06-15 LAB — BASIC METABOLIC PANEL
Calcium: 9.9 mg/dL (ref 8.4–10.5)
Creat: 0.92 mg/dL (ref 0.50–1.35)

## 2012-06-15 LAB — LIPID PANEL
Cholesterol: 197 mg/dL (ref 0–200)
HDL: 39.8 mg/dL (ref >39.00)
Triglycerides: 393 mg/dL — ABNORMAL HIGH (ref 0.0–149.0)

## 2012-06-15 LAB — CBC WITH DIFFERENTIAL/PLATELET
Basophils Absolute: 0 10*3/uL (ref 0.0–0.1)
Basophils Relative: 1 % (ref 0–1)
Eosinophils Absolute: 0.2 10*3/uL (ref 0.0–0.7)
Eosinophils Relative: 2 % (ref 0–5)
MCH: 31.9 pg (ref 26.0–34.0)
MCV: 90.2 fL (ref 78.0–100.0)
Platelets: 311 10*3/uL (ref 150–400)
RDW: 13.8 % (ref 11.5–15.5)

## 2012-06-15 LAB — HEPATIC FUNCTION PANEL
ALT: 27 U/L (ref 0–53)
Total Protein: 7.8 g/dL (ref 6.0–8.3)

## 2012-06-15 MED ORDER — RANOLAZINE ER 500 MG PO TB12: 500.0000 mg | ORAL_TABLET | Freq: Two times a day (BID) | ORAL | Status: DC

## 2012-06-15 MED ORDER — ATORVASTATIN CALCIUM 80 MG PO TABS: 80.0000 mg | ORAL_TABLET | Freq: Every day | ORAL | Status: DC

## 2012-06-15 MED ORDER — CLONAZEPAM 0.5 MG PO TABS: 0.5000 mg | ORAL_TABLET | Freq: Two times a day (BID) | ORAL | Status: DC | PRN

## 2012-06-15 MED ORDER — PAROXETINE HCL 20 MG PO TABS: 20.0000 mg | ORAL_TABLET | ORAL | Status: DC

## 2012-06-15 NOTE — Patient Instructions (Signed)
I am giving you a refill on your Klonopin.  It is the 0.5 mg tables.  You can take either 1 or 1/2 tablet. We will also start you on Paxil for your anxiety.  Plan on coming back in about 3 weeks to check in on how it is working.

## 2012-06-15 NOTE — Telephone Encounter (Signed)
New Problem:    Patient called in needing a refill of his ranolazine (RANEXA) 500 MG 12 hr tablet and atorvastatin (LIPITOR) 80 MG tablet.

## 2012-06-17 ENCOUNTER — Encounter: Payer: Self-pay | Admitting: Family Medicine

## 2012-06-22 ENCOUNTER — Telehealth: Payer: Self-pay | Admitting: Cardiovascular Disease

## 2012-06-22 NOTE — Telephone Encounter (Signed)
F/u   rtn call back to nurse

## 2012-06-22 NOTE — Telephone Encounter (Signed)
Spoke with pt and reviewed lipid and liver lab results with him.

## 2012-06-23 ENCOUNTER — Emergency Department (HOSPITAL_COMMUNITY): Payer: Medicaid Other

## 2012-06-23 ENCOUNTER — Encounter (HOSPITAL_COMMUNITY): Payer: Self-pay | Admitting: *Deleted

## 2012-06-23 ENCOUNTER — Emergency Department (HOSPITAL_COMMUNITY)
Admission: EM | Admit: 2012-06-23 | Discharge: 2012-06-23 | Disposition: A | Discharge status: Home / Self Care | Payer: Medicaid Other | Attending: Emergency Medicine | Admitting: Emergency Medicine

## 2012-06-23 DIAGNOSIS — R269 Unspecified abnormalities of gait and mobility: Secondary | ICD-10-CM | POA: Insufficient documentation

## 2012-06-23 DIAGNOSIS — R110 Nausea: Secondary | ICD-10-CM | POA: Insufficient documentation

## 2012-06-23 DIAGNOSIS — Z79899 Other long term (current) drug therapy: Secondary | ICD-10-CM | POA: Insufficient documentation

## 2012-06-23 DIAGNOSIS — Z9889 Other specified postprocedural states: Secondary | ICD-10-CM | POA: Insufficient documentation

## 2012-06-23 DIAGNOSIS — M5126 Other intervertebral disc displacement, lumbar region: Secondary | ICD-10-CM | POA: Insufficient documentation

## 2012-06-23 DIAGNOSIS — M549 Dorsalgia, unspecified: Secondary | ICD-10-CM | POA: Insufficient documentation

## 2012-06-23 DIAGNOSIS — E785 Hyperlipidemia, unspecified: Secondary | ICD-10-CM | POA: Insufficient documentation

## 2012-06-23 DIAGNOSIS — E663 Overweight: Secondary | ICD-10-CM | POA: Insufficient documentation

## 2012-06-23 DIAGNOSIS — Z951 Presence of aortocoronary bypass graft: Secondary | ICD-10-CM | POA: Insufficient documentation

## 2012-06-23 DIAGNOSIS — Z8709 Personal history of other diseases of the respiratory system: Secondary | ICD-10-CM | POA: Insufficient documentation

## 2012-06-23 DIAGNOSIS — R5381 Other malaise: Secondary | ICD-10-CM | POA: Insufficient documentation

## 2012-06-23 DIAGNOSIS — I252 Old myocardial infarction: Secondary | ICD-10-CM | POA: Insufficient documentation

## 2012-06-23 DIAGNOSIS — Z87891 Personal history of nicotine dependence: Secondary | ICD-10-CM | POA: Insufficient documentation

## 2012-06-23 DIAGNOSIS — R209 Unspecified disturbances of skin sensation: Secondary | ICD-10-CM | POA: Insufficient documentation

## 2012-06-23 DIAGNOSIS — I10 Essential (primary) hypertension: Secondary | ICD-10-CM | POA: Insufficient documentation

## 2012-06-23 MED ORDER — PREDNISONE 20 MG PO TABS: ORAL_TABLET | ORAL | Status: DC

## 2012-06-23 MED ORDER — OXYCODONE-ACETAMINOPHEN 5-325 MG PO TABS: 1.0000 | ORAL_TABLET | Freq: Four times a day (QID) | ORAL | Status: DC | PRN

## 2012-06-23 MED ORDER — OXYCODONE-ACETAMINOPHEN 5-325 MG PO TABS
2.0000 | ORAL_TABLET | Freq: Once | ORAL | Status: AC
  Administered 2012-06-23: 2 via ORAL
  Filled 2012-06-23: qty 2

## 2012-06-23 MED ORDER — HYDROMORPHONE HCL PF 1 MG/ML IJ SOLN
1.0000 mg | Freq: Once | INTRAMUSCULAR | Status: AC
  Administered 2012-06-23: 1 mg via INTRAMUSCULAR
  Filled 2012-06-23: qty 1

## 2012-06-23 MED ORDER — LORAZEPAM 1 MG PO TABS
1.0000 mg | ORAL_TABLET | Freq: Once | ORAL | Status: AC
  Administered 2012-06-23: 1 mg via ORAL
  Filled 2012-06-23: qty 1

## 2012-06-23 NOTE — ED Provider Notes (Signed)
Medical screening examination/treatment/procedure(s) were performed by non-physician practitioner and as supervising physician I was immediately available for consultation/collaboration.  Gerhard Munch, MD 06/23/12 [phone]

## 2012-06-23 NOTE — ED Notes (Signed)
Pt presents to ED with back pain, that starts in the middle of his back and is shooting to both of his legs. Pt sts it's been going on for a week now, but now it's almost unbearable. Pt sts both of his legs are feeling numb and he is having difficulties walking. Pt sts no bladder or bowel incontinence. Pt sts pain is 10/10.

## 2012-06-23 NOTE — ED Provider Notes (Signed)
History     CSN: [id number]  Arrival date & time 06/23/12  1726   First MD Initiated Contact with Patient 06/23/12 1741      Chief Complaint  Patient presents with  . Back Pain    (Consider location/radiation/quality/duration/timing/severity/associated sxs/prior treatment) HPI Comments: 36 year old male presents to the emergency department complaining of mid back pain radiating down into both his legs for the past week. Describes the pain as sharp as if someone is stabbing him with a knife. Pain is constant, rated 10 out of 10. Any movement makes the pain worse. He is unable to lay down comfortably at night due to the pain. His legs are beginning to feel numb making it hard to walk. Denies loss of control of bowels or bladder or saddle anesthesia. He has never had a back injury in the past. Denies ever being in this much pain. He has not tried any alleviating factors for his pain. No fever or chills.  Patient is a 36 y.o. male presenting with back pain. The history is provided by the patient.  Back Pain  Associated symptoms include numbness and weakness. Pertinent negatives include no chest pain and no fever.    Past Medical History  Diagnosis Date  . Hypertension   . MI (myocardial infarction)   . Hx of CABG   . Heart attack   . Bronchitis   . Tobacco user   . Hyperlipidemia     Past Surgical History  Procedure Date  . Coronary artery bypass graft     status post diaphragmatic wall infarction Rx BMS RCA 03-11-08   . Orthopedic surgery     Family History  Problem Relation Age of Onset  . Cancer Mother   . Cancer Sister   . Diabetes Mother   . Diabetes Sister   . Hyperlipidemia Mother   . Hypertension Mother   . Hyperlipidemia Sister   . Hypertension Sister   . Early death Mother   . Early death Sister     History  Substance Use Topics  . Smoking status: Former Smoker -- 0.5 packs/day    Types: Cigarettes    Quit date: 10/02/2011  . Smokeless tobacco: Never  Used  . Alcohol Use: No      Review of Systems  Constitutional: Negative for fever and chills.  HENT: Negative.   Eyes: Negative for visual disturbance.  Respiratory: Negative for shortness of breath.   Cardiovascular: Negative for chest pain.  Gastrointestinal: Positive for nausea. Negative for vomiting.  Genitourinary: Negative.   Musculoskeletal: Positive for back pain and gait problem.  Skin: Negative.   Neurological: Positive for weakness and numbness.  Psychiatric/Behavioral: Negative for confusion.    Allergies  Codeine and Celexa  Home Medications   Current Outpatient Rx  Name  Route  Sig  Dispense  Refill  . RISAQUAD PO CAPS   Oral   Take by mouth daily.         . ATORVASTATIN CALCIUM 80 MG PO TABS   Oral   Take 1 tablet (80 mg total) by mouth daily.   90 tablet   1   . CLONAZEPAM 0.5 MG PO TABS   Oral   Take 0.5 mg by mouth 2 (two) times daily.         Marland Kitchen CLOPIDOGREL BISULFATE 75 MG PO TABS   Oral   Take 1 tablet (75 mg total) by mouth daily.   30 tablet   3   . FENOFIBRATE 48 MG PO  TABS   Oral   Take 1 tablet (48 mg total) by mouth daily.   30 tablet   3     Brand name only.   Marland Kitchen LOSARTAN POTASSIUM 50 MG PO TABS   Oral   Take 1 tablet (50 mg total) by mouth daily.   90 tablet   3   . METOPROLOL TARTRATE 25 MG PO TABS   Oral   Take 1 tablet (25 mg total) by mouth 2 (two) times daily.   60 tablet   6   . ADULT MULTIVITAMIN W/MINERALS CH   Oral   Take 1 tablet by mouth daily.         Marland Kitchen NITROGLYCERIN 0.4 MG SL SUBL   Sublingual   Place 1 tablet (0.4 mg total) under the tongue every 5 (five) minutes as needed.   25 tablet   11   . PAROXETINE HCL 20 MG PO TABS   Oral   Take 1 tablet (20 mg total) by mouth every morning.   30 tablet   1   . RANOLAZINE ER 500 MG PO TB12   Oral   Take 1 tablet (500 mg total) by mouth 2 (two) times daily.   180 tablet   1   . VITAMIN C 500 MG PO TABS   Oral   Take 500 mg by mouth daily.            BP 138/92  Pulse 85  Temp 98.1 F (36.7 C)  SpO2 100%  Physical Exam  Constitutional: He is oriented to person, place, and time. He appears well-developed. No distress.       Overweight, having difficulty getting comfortable.  HENT:  Head: Normocephalic and atraumatic.  Eyes: Conjunctivae normal are normal.  Neck: Normal range of motion. Neck supple.  Cardiovascular: Normal rate, regular rhythm, normal heart sounds and intact distal pulses.   Pulmonary/Chest: Effort normal and breath sounds normal.  Abdominal: Soft. Bowel sounds are normal. There is no tenderness.  Musculoskeletal:       Thoracic back: He exhibits decreased range of motion, tenderness and bony tenderness.       Lumbar back: He exhibits decreased range of motion, tenderness and bony tenderness. He exhibits normal pulse.       Back:       Marked tenderness throughout thoracic and lumbar spine.  Neurological: He is alert and oriented to person, place, and time. No sensory deficit. Gait abnormal.       Decreased strength 3/5 bilateral lower extremities.  Skin: Skin is warm and dry.  Psychiatric: He is slowed.       Flat affect.    ED Course  Procedures (including critical care time)  Labs Reviewed - No data to display Dg Eye Foreign Body  06/23/2012  *RADIOLOGY REPORT*  Clinical Data: Pre MRI.  History of metal work.  Evaluate for foreign body.  ORBITS FOR FOREIGN BODY - 2 VIEW  Comparison: CT of the brain 10/26/2007  Findings: Two views are performed, showing no evidence for intraorbital metallic foreign body.  Visualized paranasal sinuses are well-aerated.  IMPRESSION: Negative exam.   Original Report Authenticated By: Norva Pavlov, M.D.    Dg Lumbar Spine Complete  06/23/2012  *RADIOLOGY REPORT*  Clinical Data: Low back pain for 1 week, some radiation down the legs  LUMBAR SPINE - COMPLETE 4+ VIEW  Comparison: None.  Findings: The lumbar vertebrae are in normal alignment. Intervertebral disc  spaces appear normal.  No compression deformity  is seen.  No pars defect is noted.  The SI joints appear normal.  IMPRESSION: Normal alignment.  Normal disc spaces.   Original Report Authenticated By: Dwyane Dee, M.D.    Mr Lumbar Spine Wo Contrast  06/23/2012  *RADIOLOGY REPORT*  Clinical Data: Back pain.  Leg numbness.  Gait change.  MRI LUMBAR SPINE WITHOUT CONTRAST  Technique:  Multiplanar and multiecho pulse sequences of the lumbar spine were obtained without intravenous contrast.  Comparison: 06/23/2012 plain film exam.  No comparison MR.  Findings: Last fully open disc space is labeled L5-S1.  Present examination incorporates from T11-12 disc space through the S3 level.  Conus L1-2 level and appears unremarkable.  No worrisome bony lesion.  Visualized paravertebral structures unremarkable.  T11-12:  Sagittal images suggest minimal bulge.  T12-L1:  Negative.  L1-2:  Minimal left facet bony overgrowth.  Slightly prominent dorsal epidural fat.  L2-3:  Minimal facet joint degenerative changes greater left. Slightly prominent dorsal epidural fat.  L3-4:  Minimal right-sided facet joint degenerative changes. Minimal bulge.  Slightly prominent dorsal epidural fat.  L4-5:  Bulge greater to the left.  Facet joint degenerative changes.  Prominent dorsal epidural fat.  Mild thecal sac narrowing slightly greater on the left.  Minimal foraminal narrowing.  L5-S1:  Disc degeneration with broad-based disc osteophyte complex. Extension into the neural foramen with slight encroachment upon but not significant compression of the exiting L5 nerve roots.  Minimal impression upon the central ventral aspect of the thecal sac.  Very mild facet joint degenerative changes.  Slightly prominent epidural fat.  IMPRESSION: Slightly prominent dorsal epidural fat.  L4-5 bulge greater to the left.  Mild thecal sac narrowing slightly greater on the left.  L5-S1 broad-based disc osteophyte complex.  Extension into the neural foramen with  slight encroachment upon but not significant compression of the exiting L5 nerve roots.  Minimal impression upon the central ventral aspect of the thecal sac.  Please see above for further detail.   Original Report Authenticated By: Lacy Duverney, M.D.      1. Lumbar herniated disc   2. Back pain       MDM  36 year old male with severe back pain. Patient was extremely tender on exam and had notable weakness in lower extremities. X-ray lumbar spine did not show any abnormality. Case discussed with Dr. Jeraldine Loots who agreed that MRI would be the next appropriate study. MRI showing some pathology with the L5 nerve root along with an L4-L5 disc bulge. Patient is a little more comfortable after receiving 2 Percocet. Patient discharged with prednisone taper along with pain control. Advised followup with the spine specialist and his primary care physician. Case discussed with Dr. Jeraldine Loots who agrees with plan of care.      Trevor Mace, PA-C 06/23/12 2228

## 2012-06-24 ENCOUNTER — Telehealth: Payer: Self-pay | Admitting: Family Medicine

## 2012-06-24 NOTE — Telephone Encounter (Signed)
Patient was in ER last night and Dx'd with a Herniated Disc.  He needs to make a f/u appt with [facility], but he needs to be sure the referral has been sent.

## 2012-06-27 ENCOUNTER — Other Ambulatory Visit: Payer: Self-pay | Admitting: *Deleted

## 2012-06-27 ENCOUNTER — Telehealth: Payer: Self-pay | Admitting: Cardiovascular Disease

## 2012-06-27 ENCOUNTER — Encounter: Payer: Self-pay | Admitting: Family Medicine

## 2012-06-27 MED ORDER — CLOPIDOGREL BISULFATE 75 MG PO TABS: 75.0000 mg | ORAL_TABLET | Freq: Every day | ORAL | Status: DC

## 2012-06-27 NOTE — Assessment & Plan Note (Signed)
Currently well controlled.  Recheck BMET today.

## 2012-06-27 NOTE — Progress Notes (Signed)
Patient ID: [name], male   DOB: [birth year], 36 y.o.   MRN: [id number] Subjective: The patient is a 36 y.o. year old male who presents today for f/u.  1. Anxiety: Patient reports continued problems with anxiety.  He has not been taking Klonopin much due to it causing him to be too sleepy.  He was unable to tolerate Celexa due to rash on his face.  He has not tried other medications for anxiety nor has he tried 1/2 doses of Klonopin.  2.  CAD: Denies any chest pain, SOB, DOE, leg swelling.  3. HTN: Taking meds with no side effects noted.  When he does check his bp, is well controlled (<130 systolic)  4. HLD: Taking Lipitor 80 with no complications.  Has not had labs checked in some time.  Patient's past medical, social, and family history were reviewed and updated as appropriate. History  Substance Use Topics  . Smoking status: Former Smoker -- 0.5 packs/day    Types: Cigarettes    Quit date: 10/02/2011  . Smokeless tobacco: Never Used  . Alcohol Use: No   Objective:  Filed Vitals:   06/15/12 1547  BP: 120/82  Pulse: 90  Temp: 98.1 F (36.7 C)   Gen: NAD, obese, currently minimally anxious CV: RRR, no murmurs Resp: CTABL Ext: No edema, 2+ pulses  Assessment/Plan:  Please also see individual problems in problem list for problem-specific plans.

## 2012-06-27 NOTE — Telephone Encounter (Signed)
Pt calling re refill of generic plavix, [facility] [address]

## 2012-06-27 NOTE — Assessment & Plan Note (Signed)
Drop Xanax dose by 1/2 and start different SSRI.  Talked with the patient about the fact that going back on Xanax is not an option.

## 2012-06-27 NOTE — Assessment & Plan Note (Signed)
Recheck today.  If still elevated may consider switching to Crestor and/or increasing Tricor.

## 2012-06-29 NOTE — Telephone Encounter (Signed)
If ED referred him, then he has a referral.  Otherwise, if he wants me to refer him, I probably need to see him so we can discuss what he thinks he will get out of the referral.

## 2012-06-30 NOTE — Telephone Encounter (Signed)
Looks like patient is scheduled to see you on 12/18.  We have to do the referral because he is Medicaid [place] Access.  Ileana Ladd

## 2012-07-06 ENCOUNTER — Ambulatory Visit: Payer: Medicaid Other | Admitting: Family Medicine

## 2012-07-23 ENCOUNTER — Emergency Department (HOSPITAL_COMMUNITY)
Admission: EM | Admit: 2012-07-23 | Discharge: 2012-07-23 | Disposition: A | Discharge status: Home / Self Care | Payer: Medicaid Other | Attending: Emergency Medicine | Admitting: Emergency Medicine

## 2012-07-23 ENCOUNTER — Encounter (HOSPITAL_COMMUNITY): Payer: Self-pay | Admitting: Emergency Medicine

## 2012-07-23 DIAGNOSIS — Z87891 Personal history of nicotine dependence: Secondary | ICD-10-CM | POA: Insufficient documentation

## 2012-07-23 DIAGNOSIS — E785 Hyperlipidemia, unspecified: Secondary | ICD-10-CM | POA: Insufficient documentation

## 2012-07-23 DIAGNOSIS — Z79899 Other long term (current) drug therapy: Secondary | ICD-10-CM | POA: Insufficient documentation

## 2012-07-23 DIAGNOSIS — Z8709 Personal history of other diseases of the respiratory system: Secondary | ICD-10-CM | POA: Insufficient documentation

## 2012-07-23 DIAGNOSIS — M5126 Other intervertebral disc displacement, lumbar region: Secondary | ICD-10-CM | POA: Insufficient documentation

## 2012-07-23 DIAGNOSIS — I10 Essential (primary) hypertension: Secondary | ICD-10-CM | POA: Insufficient documentation

## 2012-07-23 DIAGNOSIS — Z8673 Personal history of transient ischemic attack (TIA), and cerebral infarction without residual deficits: Secondary | ICD-10-CM | POA: Insufficient documentation

## 2012-07-23 DIAGNOSIS — I2510 Atherosclerotic heart disease of native coronary artery without angina pectoris: Secondary | ICD-10-CM | POA: Insufficient documentation

## 2012-07-23 DIAGNOSIS — I252 Old myocardial infarction: Secondary | ICD-10-CM | POA: Insufficient documentation

## 2012-07-23 DIAGNOSIS — Z951 Presence of aortocoronary bypass graft: Secondary | ICD-10-CM | POA: Insufficient documentation

## 2012-07-23 MED ORDER — OXYCODONE-ACETAMINOPHEN 5-325 MG PO TABS
1.0000 | ORAL_TABLET | Freq: Once | ORAL | Status: AC
  Administered 2012-07-23: 1 via ORAL
  Filled 2012-07-23: qty 1

## 2012-07-23 MED ORDER — PREDNISONE 50 MG PO TABS: 50.0000 mg | ORAL_TABLET | Freq: Every day | ORAL | Status: DC

## 2012-07-23 MED ORDER — KETOROLAC TROMETHAMINE 60 MG/2ML IM SOLN
60.0000 mg | Freq: Once | INTRAMUSCULAR | Status: AC
  Administered 2012-07-23: 60 mg via INTRAMUSCULAR
  Filled 2012-07-23: qty 2

## 2012-07-23 MED ORDER — PREDNISONE 20 MG PO TABS
60.0000 mg | ORAL_TABLET | Freq: Once | ORAL | Status: AC
  Administered 2012-07-23: 60 mg via ORAL
  Filled 2012-07-23: qty 3

## 2012-07-23 MED ORDER — CYCLOBENZAPRINE HCL 10 MG PO TABS: 10.0000 mg | ORAL_TABLET | Freq: Three times a day (TID) | ORAL | Status: DC | PRN

## 2012-07-23 MED ORDER — OXYCODONE-ACETAMINOPHEN 5-325 MG PO TABS
1.0000 | ORAL_TABLET | Freq: Four times a day (QID) | ORAL | Status: DC | PRN
Start: 2012-07-23 — End: 2012-07-27

## 2012-07-23 NOTE — ED Provider Notes (Signed)
Medical screening examination/treatment/procedure(s) were performed by non-physician practitioner and as supervising physician I was immediately available for consultation/collaboration. Devoria Albe, MD, Armando Gang   Ward Givens, MD 07/23/12 1728

## 2012-07-23 NOTE — ED Notes (Signed)
Patient states that he has had back pain for that last 3 months the patient reports that his pain started to have increased numbness up his neck and pain up into his headache

## 2012-07-23 NOTE — ED Provider Notes (Signed)
History  This chart was scribed for Ebbie Ridge, PA-C working with Ward Givens, MD by Shari Heritage, ED Scribe. This patient was seen in room WTR5/WTR5 and the patient's care was started at 1603.   CSN: [id number]  Arrival date & time 07/23/12  1603    Chief Complaint  Patient presents with  . Back Pain    The history is provided by the patient. No language interpreter was used.    HPI Comments: [name] is a 37 y.o. male who presents to the Emergency Department complaining of recurrent, moderate to severe, constant, stabbing, right-sided lower back pain that radiates up to his neck. Patient says that the pain began 3 months ago. There is associated tingling and numbness in legs bilaterally. Patient denies any obvious injury or trauma 3 months ago to precipitate the back pain. Patient says that it is difficult for him to sit in chairs and sleep through the night due to the severity of the pain. Patient had an MRI during his last visit to the ED which revealed a herniated disc and nerve problems. Patient has a medical history of CABG and MI and has been on disability for the past year. 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 Patient states that he has an appointment with [facility], but it not for several more weeks. Patient is allergic to paxil and klonopin. Other medical history includes hypertension and hyperlipidemia.    Past Medical History  Diagnosis Date  . Hypertension   . MI (myocardial infarction)   . Hx of CABG   . Heart attack   . Bronchitis   . Tobacco user   . Hyperlipidemia   . MYOCARDIAL INFARCTION 10/13/2008    Qualifier: Diagnosis of  By: Juanda Chance, MD, Johny Chess     Past Surgical History  Procedure Date  . Coronary artery bypass graft     status post diaphragmatic wall infarction Rx BMS RCA 03-11-08   . Orthopedic surgery     Family History  Problem Relation Age of Onset  . Cancer Mother     . Cancer Sister   . Diabetes Mother   . Diabetes Sister   . Hyperlipidemia Mother   . Hypertension Mother   . Hyperlipidemia Sister   . Hypertension Sister   . Early death Mother   . Early death Sister     History  Substance Use Topics  . Smoking status: Former Smoker -- 0.5 packs/day    Types: Cigarettes    Quit date: 10/02/2011  . Smokeless tobacco: Never Used  . Alcohol Use: No      Review of Systems All other systems negative except as documented in the HPI. All pertinent positives and negatives as reviewed in the HPI.  Allergies  Codeine and Celexa  Home Medications   Current Outpatient Rx  Name  Route  Sig  Dispense  Refill  . RISAQUAD PO CAPS   Oral   Take by mouth daily.         . ATORVASTATIN CALCIUM 80 MG PO TABS   Oral   Take 1 tablet (80 mg total) by mouth daily.   90 tablet   1   . CLONAZEPAM 0.5 MG PO TABS   Oral   Take 0.5 mg by mouth 2 (two) times daily.         Marland Kitchen CLOPIDOGREL BISULFATE 75 MG PO TABS   Oral   Take 1  tablet (75 mg total) by mouth daily.   30 tablet   3   . FENOFIBRATE 48 MG PO TABS   Oral   Take 1 tablet (48 mg total) by mouth daily.   30 tablet   3     Brand name only.   Marland Kitchen LOSARTAN POTASSIUM 50 MG PO TABS   Oral   Take 1 tablet (50 mg total) by mouth daily.   90 tablet   3   . METOPROLOL TARTRATE 25 MG PO TABS   Oral   Take 1 tablet (25 mg total) by mouth 2 (two) times daily.   60 tablet   6   . ADULT MULTIVITAMIN W/MINERALS CH   Oral   Take 1 tablet by mouth daily.         Marland Kitchen NITROGLYCERIN 0.4 MG SL SUBL   Sublingual   Place 1 tablet (0.4 mg total) under the tongue every 5 (five) minutes as needed.   25 tablet   11   . OXYCODONE-ACETAMINOPHEN 5-325 MG PO TABS   Oral   Take 1-2 tablets by mouth every 6 (six) hours as needed for pain.   15 tablet   0   . PAROXETINE HCL 20 MG PO TABS   Oral   Take 1 tablet (20 mg total) by mouth every morning.   30 tablet   1   . PREDNISONE 20 MG PO  TABS      Take 3 tabs PO x 2 days followed by 2 tabs PO x 2 days followed by 1 tab PO x 2 days   12 tablet   0   . RANOLAZINE ER 500 MG PO TB12   Oral   Take 1 tablet (500 mg total) by mouth 2 (two) times daily.   180 tablet   1   . VITAMIN C 500 MG PO TABS   Oral   Take 500 mg by mouth daily.           Triage Vitals: BP 127/81  Pulse 88  Temp 98.5 F (36.9 C) (Oral)  Resp 18  SpO2 100%  Physical Exam  Nursing note and vitals reviewed. Constitutional: He is oriented to person, place, and time. He appears well-developed and well-nourished.  HENT:  Head: Normocephalic and atraumatic.  Neck: Neck supple.  Cardiovascular: Normal rate and regular rhythm.   Pulmonary/Chest: Effort normal and breath sounds normal.  Abdominal: He exhibits no distension.  Musculoskeletal:       Lumbar back: He exhibits pain.       Palpable midline and lateral pain that radiates from lower back up to neck.  Neurological: He is alert and oriented to person, place, and time. He has normal strength and normal reflexes. He displays normal reflexes. No cranial nerve deficit or sensory deficit. He exhibits normal muscle tone. Coordination and gait normal. GCS eye subscore is 4. GCS verbal subscore is 5. GCS motor subscore is 6.  Skin: Skin is warm and dry. No rash noted.  Psychiatric: He has a normal mood and affect. His behavior is normal.    ED Course  Procedures (including critical care time) DIAGNOSTIC STUDIES: Oxygen Saturation is 100% on room air, normal by my interpretation.    COORDINATION OF CARE: 4:53 PM- Patient here with radiating back pain from lumbar region to posterior neck. Patient states that he had an MRI during his last visit. Will review patient's MRI. Have encouraged patient to try to get an appointment at [facility] sooner.  Patient does not have any signs of gait disturbance or motor impairment.  Patient is mainly here for pain control.        MDM  I personally performed  the services described in this documentation, which was scribed in my presence. The recorded information has been reviewed and is accurate.   Carlyle Dolly, PA-C 07/23/12 1709

## 2012-07-27 ENCOUNTER — Ambulatory Visit (INDEPENDENT_AMBULATORY_CARE_PROVIDER_SITE_OTHER): Payer: Medicaid Other | Admitting: Family Medicine

## 2012-07-27 ENCOUNTER — Encounter: Payer: Self-pay | Admitting: Family Medicine

## 2012-07-27 VITALS — BP 165/92 | HR 85 | Ht 71.0 in | Wt 276.0 lb

## 2012-07-27 DIAGNOSIS — M519 Unspecified thoracic, thoracolumbar and lumbosacral intervertebral disc disorder: Secondary | ICD-10-CM

## 2012-07-27 MED ORDER — OXYCODONE-ACETAMINOPHEN 5-325 MG PO TABS: 1.0000 | ORAL_TABLET | Freq: Four times a day (QID) | ORAL | Status: DC | PRN

## 2012-07-27 MED ORDER — GABAPENTIN 100 MG PO CAPS: ORAL_CAPSULE | ORAL | Status: DC

## 2012-07-27 NOTE — Assessment & Plan Note (Signed)
Symptomatic disk disease.  Agree with seeing a neurosurgeon although would recommend conservative therapy until this fails.  Patient will start with PT and with gabapentin for neurogenic pain in legs.  Percocet refilled for if it is necessary for PT.

## 2012-07-27 NOTE — Patient Instructions (Signed)
Start taking gabapentin 1 pill at night.  Every three to 4 days increase this by one pill until you are taking 3 pills 3 times per day. You should be contacted about a physical therapy appointment sometime in the next day or two.

## 2012-07-27 NOTE — Progress Notes (Signed)
Patient ID: [name], male   DOB: Nov 23, 1975, 37 y.o.   MRN: [id number] Subjective: The patient is a 37 y.o. year old male who presents today for back pain.  Patient reports that he had an allergic reaction (facial rash) while on Klonopin and Prozac.  He stopped both and the rash went away.  Patient also is having significant problems with back pain.  This began in December and he was diagnosed with herniated L5/S1 disk with nerve root impingement.  Patient has been taking percocet intermittent for back pain.  He does not really like taking it.  He does continue to complain of low back pain and numbness and tingling in his legs and feet.  No bowel or bladder incontinence.  No weakness in lower extremities.  Patient's past medical, social, and family history were reviewed and updated as appropriate. History  Substance Use Topics  . Smoking status: Current Every Day Smoker -- 0.5 packs/day    Types: Cigarettes  . Smokeless tobacco: Never Used  . Alcohol Use: No   Objective:  Filed Vitals:   07/27/12 1612  BP: 165/92  Pulse: 85   Gen: NAD, obese Back: Diffuse Paraspinous muscle spasm on the right. Ext: No leg wekaness.  Assessment/Plan:  Please also see individual problems in problem list for problem-specific plans.

## 2012-08-02 ENCOUNTER — Ambulatory Visit (INDEPENDENT_AMBULATORY_CARE_PROVIDER_SITE_OTHER): Payer: Medicaid Other | Admitting: Cardiovascular Disease

## 2012-08-02 ENCOUNTER — Encounter: Payer: Self-pay | Admitting: Cardiovascular Disease

## 2012-08-02 VITALS — BP 124/96 | HR 77 | Ht 71.0 in | Wt 275.0 lb

## 2012-08-02 DIAGNOSIS — M549 Dorsalgia, unspecified: Secondary | ICD-10-CM

## 2012-08-02 DIAGNOSIS — E785 Hyperlipidemia, unspecified: Secondary | ICD-10-CM

## 2012-08-02 DIAGNOSIS — I2510 Atherosclerotic heart disease of native coronary artery without angina pectoris: Secondary | ICD-10-CM

## 2012-08-02 MED ORDER — CLOPIDOGREL BISULFATE 75 MG PO TABS: 75.0000 mg | ORAL_TABLET | Freq: Every day | ORAL | Status: DC

## 2012-08-02 MED ORDER — FENOFIBRATE 48 MG PO TABS: 48.0000 mg | ORAL_TABLET | Freq: Every day | ORAL | Status: DC

## 2012-08-02 MED ORDER — CYCLOBENZAPRINE HCL 10 MG PO TABS: 10.0000 mg | ORAL_TABLET | Freq: Three times a day (TID) | ORAL | Status: DC | PRN

## 2012-08-02 NOTE — Progress Notes (Signed)
History of Present Illness: [name] is a 37 year old gentleman with history of CAD with serial PCI and a 4-vessel CABG in 2009, HTN, HLD, tobacco abuse who is here today for cardiac follow up. He was admitted to [facility] Emergency Department with chest pain on 10/19/11 and transferred to [facility] for cath per Dr. Riley Kill. Cardiac cath on 10/19/11 as below with stable CAD, bypasses. He was discharged home on Imdur. His coronary history began in 2009 with an inferior MI requiring RCA PCI and subsequent CABG. He then presented in 2010 with an NSTEMI in 2010 requiring LCx PCI. He underwent repeat coronary angiography in September of 2011 and was found to have stable native and bypass graft anatomy not requiring any intervention at the time. As above, last cath in April 2013 with stable disease. Started on Ranexa in April 2013. ( in assistance program)   He is here today for cardiac follow up. He tells me that he has started back smoking. His chest pain is much better controlled. He thinks the Ranexa has helped. No SOB. No palpitations. He is upset about his back pain and his anxiety. He did not tolerate Celexa, Paxil, or Klonopin secondary to a rash. His Xanax was stopped. He has a herniated disc and had been on Flexeril which worked well for his back pain but he is now out of this. He does not tolerate Roxicet secondary to constipation. He is going to see surgery this week for evaluation of his disc disease.   Primary Care Physician: Ritch  Last Lipid Profile:Lipid Panel     Component Value Date/Time   CHOL 197 06/15/2012 1446   TRIG 393.0* 06/15/2012 1446   HDL 39.80 06/15/2012 1446   CHOLHDL 5 06/15/2012 1446   VLDL 78.6* 06/15/2012 1446   LDLCALC UNABLE TO CALCULATE IF TRIGLYCERIDE OVER 400 mg/dL 07/25/1094 0454     Past Medical History  Diagnosis Date  . Hypertension   . MI (myocardial infarction)   . Hx of CABG   . Heart attack   . Bronchitis   . Tobacco user   . Hyperlipidemia   .  MYOCARDIAL INFARCTION 10/13/2008    Qualifier: Diagnosis of  By: Juanda Chance, MD, Johny Chess     Past Surgical History  Procedure Date  . Coronary artery bypass graft     status post diaphragmatic wall infarction Rx BMS RCA 03-11-08   . Orthopedic surgery     Current Outpatient Prescriptions  Medication Sig Dispense Refill  . atorvastatin (LIPITOR) 80 MG tablet Take 1 tablet (80 mg total) by mouth daily.  90 tablet  1  . clopidogrel (PLAVIX) 75 MG tablet Take 1 tablet (75 mg total) by mouth daily.  30 tablet  3  . fenofibrate (TRICOR) 48 MG tablet Take 1 tablet (48 mg total) by mouth daily.  30 tablet  3  . gabapentin (NEURONTIN) 100 MG capsule Start with one pill by mouth once per day and increase to three pills three times per day over the next 4 weeks.  90 capsule  3  . losartan (COZAAR) 50 MG tablet Take 1 tablet (50 mg total) by mouth daily.  90 tablet  3  . metoprolol tartrate (LOPRESSOR) 25 MG tablet Take 1 tablet (25 mg total) by mouth 2 (two) times daily.  60 tablet  6  . Multiple Vitamin (MULITIVITAMIN WITH MINERALS) TABS Take 1 tablet by mouth daily.      . nitroGLYCERIN (NITROSTAT) 0.4 MG SL tablet  Place 1 tablet (0.4 mg total) under the tongue every 5 (five) minutes as needed.  25 tablet  11  . ranolazine (RANEXA) 500 MG 12 hr tablet Take 1 tablet (500 mg total) by mouth 2 (two) times daily.  180 tablet  1  . vitamin C (ASCORBIC ACID) 500 MG tablet Take 500 mg by mouth daily.        Allergies  Allergen Reactions  . Codeine Itching  . Celexa (Citalopram) Itching and Rash    History   Social History  . Marital Status: Divorced    Spouse Name: N/A    Number of Children: N/A  . Years of Education: N/A   Occupational History  . Not on file.   Social History Main Topics  . Smoking status: Current Every Day Smoker -- 0.5 packs/day    Types: Cigarettes  . Smokeless tobacco: Never Used  . Alcohol Use: No  . Drug Use: No  . Sexually Active: Not on file   Other  Topics Concern  . Not on file   Social History Narrative  . No narrative on file    Family History  Problem Relation Age of Onset  . Cancer Mother   . Cancer Sister   . Diabetes Mother   . Diabetes Sister   . Hyperlipidemia Mother   . Hypertension Mother   . Hyperlipidemia Sister   . Hypertension Sister   . Early death Mother   . Early death Sister     Review of Systems:  As stated in the HPI and otherwise negative.   BP 124/96  Pulse 77  Ht 5\' 11"  (1.803 m)  Wt 275 lb (124.739 kg)  BMI 38.35 kg/m2  SpO2 97%  Physical Examination: General: Well developed, well nourished, NAD HEENT: OP clear, mucus membranes moist SKIN: warm, dry. No rashes. Neuro: No focal deficits Musculoskeletal: Muscle strength 5/5 all ext Psychiatric: Mood and affect normal Neck: No JVD, no carotid bruits, no thyromegaly, no lymphadenopathy. Lungs:Clear bilaterally, no wheezes, rhonci, crackles Cardiovascular: Regular rate and rhythm. No murmurs, gallops or rubs. Abdomen:Soft. Bowel sounds present. Non-tender.  Extremities: No lower extremity edema. Pulses are 2 + in the bilateral DP/PT.  Cardiac cath 10/19/11 per Dr. Riley Kill:  Left mainstem: Normal without significant obstruction  Left anterior descending (LAD): Has segmental 70% stenosis leading into the diagonal. The diagonal graft is known to be occluded. There is competitive filling distally due to the IMA.  Left circumflex (LCx): Small diffusely diseased ramus with mid long 90% occlusion. The first and second marginals are small and not critically diseased. The OM 3 has been stented and is widely patent out to the anterolateral wall. There is a 40-50% distal stenosis at a flexion point in the vessel.  The IMA to the distal LAD is intact. There is kink in the mid vessel that is well visualized, but likely does not limit flow distally.  Right coronary artery (RCA): The RCA has been previously stented in the distal portion of the mid vessel.  There is about 40% smooth narrowing at this site. The PDA and PLA2 are large and without significant narrowing. The PLA 1 has a proximal 80-90% stenosis, but similar to the 2011 exam.  Left ventriculography: Left ventricular systolic function is normal, LVEF is estimated at 55-65%, there is no significant mitral regurgitation. ? Minimal inferior hypokinesis   Assessment and Plan:   1. CAD: Stable. Cath April 2013 as above with stable severe disease s/p CABG. He is doing well  on Ranexa. Will continue ASA, statin, beta blocker, ARB.  2. HYPERLIPIDEMIA: Continue statin and Tricor.  3. TOBACCO USE: He wishes to stop. Will try e-cigs.  4. Back pain: Will give Flexeril 10 mg po TID for 10 days to use until he sees Neurosurgery next week.

## 2012-08-02 NOTE — Patient Instructions (Signed)
Your physician wants you to follow-up in:  6 months. You will receive a reminder letter in the mail two months in advance. If you don't receive a letter, please call our office to schedule the follow-up appointment.   

## 2012-08-22 ENCOUNTER — Telehealth: Payer: Self-pay | Admitting: Cardiovascular Disease

## 2012-08-22 MED ORDER — NITROGLYCERIN 0.4 MG SL SUBL: 0.4000 mg | SUBLINGUAL_TABLET | SUBLINGUAL | Status: DC | PRN

## 2012-08-22 NOTE — Telephone Encounter (Signed)
New Problem:    Patient called in needing a refill of his nitroGLYCERIN (NITROSTAT) 0.4 MG SL tablet sent into the [facility] pharmacy on [address] Phone#- [phone]

## 2012-08-25 ENCOUNTER — Ambulatory Visit: Payer: Medicaid Other | Admitting: Cardiovascular Disease

## 2012-08-28 ENCOUNTER — Other Ambulatory Visit: Payer: Self-pay | Admitting: Cardiovascular Disease

## 2012-09-02 ENCOUNTER — Ambulatory Visit: Payer: Medicaid Other | Admitting: Family Medicine

## 2012-09-05 ENCOUNTER — Ambulatory Visit: Payer: Medicaid Other | Attending: Family Medicine | Admitting: Rehabilitative and Restorative Service Providers"

## 2012-09-08 ENCOUNTER — Telehealth: Payer: Self-pay | Admitting: Cardiovascular Disease

## 2012-09-08 DIAGNOSIS — F17200 Nicotine dependence, unspecified, uncomplicated: Secondary | ICD-10-CM

## 2012-09-08 MED ORDER — VARENICLINE TARTRATE 0.5 MG X 11 & 1 MG X 42 PO MISC: ORAL | Status: DC

## 2012-09-08 MED ORDER — VARENICLINE TARTRATE 1 MG PO TABS: 1.0000 mg | ORAL_TABLET | Freq: Two times a day (BID) | ORAL | Status: DC

## 2012-09-08 NOTE — Telephone Encounter (Signed)
New problem    Need a Rx for chantix    [facility] [address] [phone].

## 2012-09-08 NOTE — Telephone Encounter (Signed)
Left message to call back  

## 2012-09-08 NOTE — Telephone Encounter (Signed)
Spoke with pt. He used Chantix starter pack in July and this helped some.  He has now resumed smoking and would like to try Chantix again.  I reviewed with pharmacist and pt should start with starter pack again.  I gave pt this information and told him I would send starter pack to his pharmacy.  Will also send continuing pack for 1 month which he can use after completing starter pack.  Pt verbalizes understanding of instructions.

## 2012-09-12 ENCOUNTER — Encounter: Payer: Self-pay | Admitting: Family Medicine

## 2012-09-12 ENCOUNTER — Ambulatory Visit (INDEPENDENT_AMBULATORY_CARE_PROVIDER_SITE_OTHER): Payer: Medicaid Other | Admitting: Family Medicine

## 2012-09-12 VITALS — BP 126/94 | HR 81 | Temp 98.8°F | Ht 71.0 in | Wt 278.0 lb

## 2012-09-12 DIAGNOSIS — M519 Unspecified thoracic, thoracolumbar and lumbosacral intervertebral disc disorder: Secondary | ICD-10-CM

## 2012-09-12 DIAGNOSIS — I2510 Atherosclerotic heart disease of native coronary artery without angina pectoris: Secondary | ICD-10-CM

## 2012-09-12 DIAGNOSIS — I10 Essential (primary) hypertension: Secondary | ICD-10-CM

## 2012-09-12 DIAGNOSIS — F17200 Nicotine dependence, unspecified, uncomplicated: Secondary | ICD-10-CM

## 2012-09-12 MED ORDER — LOSARTAN POTASSIUM 100 MG PO TABS: 100.0000 mg | ORAL_TABLET | Freq: Every day | ORAL | Status: DC

## 2012-09-12 MED ORDER — CYCLOBENZAPRINE HCL 10 MG PO TABS: 10.0000 mg | ORAL_TABLET | Freq: Three times a day (TID) | ORAL | Status: DC | PRN

## 2012-09-12 NOTE — Progress Notes (Signed)
Patient ID: [name], male   DOB: 08/23/1975, 37 y.o.   MRN: [id number] Subjective: The patient is a 37 y.o. year old male who presents today for followup.  1. Chest pain and hypertension: Patient has been seeing cardiology. He reports no new chest pain. His recent catheter report was reviewed. He is noted to have persistent diastolic hypertension.  2. Back pain: This is a persistent problem. He has been seen by neurosurgery who is not addition to any intervention. He does report that at the beginning of his back pain he had significant relief with oral steroids. He reports that the Flexeril given to him by his cardiologist was helpful but he is now out of it. He has not been taking any narcotics as they cause significant problems with constipation. His back pain continues to be in his low back. He has no problems with bowel or bladder function. The character, quantity, and radiation of his pain is unchanged.  3. Smoking: Patient is desperately attempting to stop smoking. This made somewhat hard by the fact that his male companion continues to smoke. She does not have a primary care provider but plans to find one to discuss smoking cessation options. He is currently back on Chantix but has not gotten rid of all tobacco related products. He would be interested in further counseling regarding other options to help both with cravings and nicotine dependence.  Patient's past medical, social, and family history were reviewed and updated as appropriate. History  Substance Use Topics  . Smoking status: Current Every Day Smoker -- 0.50 packs/day    Types: Cigarettes  . Smokeless tobacco: Never Used  . Alcohol Use: No   Objective:  Filed Vitals:   09/12/12 1616  BP: 126/94  Pulse: 81  Temp: 98.8 F (37.1 C)   Gen: NAD, obese CV: RRR Resp: CTABL Back: Some paraspinous muscle spasm, normal gait  Assessment/Plan:  Please also see individual problems in problem list for problem-specific  plans.

## 2012-09-12 NOTE — Patient Instructions (Addendum)
Because your blood pressure has been a little on the high side, I am increasing your losartan to 100mg  every day.  You can take 2 of your 50mg  tablets every day until you run out then get your new prescription, which will be 100mg  tables, and take one per day. I am referring you to an orthopedist to discuss an epidural steroid injection. I have sent in a prescription for flexeril for your back pain.  You can take it up to 3 times per day. I would recommend that you make an appointment with our pharmacist, Dr. Raymondo Band, to discuss other options to help with stopping smoking.  He works at our office and you can make an appointment with him at our front desk.

## 2012-09-12 NOTE — Assessment & Plan Note (Signed)
Increase cozar to 100mg , recheck 1 month.  Goal would be at least <140/90, likely benefit from 130/80 given his early severe CAD.

## 2012-09-12 NOTE — Assessment & Plan Note (Signed)
Cont flexeril, referral to ortho for consideration for epidural steroid injection

## 2012-09-12 NOTE — Assessment & Plan Note (Signed)
Will plan to refer to our pharmacist for more complete discussion about nicotine replacement options.  Given the fact that he reports problems with cravings, may benefit from gum or lozenges more than patches.

## 2012-09-15 ENCOUNTER — Ambulatory Visit: Payer: Medicaid Other | Admitting: Physical Therapy

## 2012-09-26 ENCOUNTER — Ambulatory Visit: Payer: Medicaid Other | Admitting: Physical Therapy

## 2012-10-11 ENCOUNTER — Encounter: Payer: Self-pay | Admitting: Family Medicine

## 2012-10-11 ENCOUNTER — Ambulatory Visit (INDEPENDENT_AMBULATORY_CARE_PROVIDER_SITE_OTHER): Payer: Medicaid Other | Admitting: Family Medicine

## 2012-10-11 ENCOUNTER — Other Ambulatory Visit: Payer: Self-pay | Admitting: Cardiovascular Disease

## 2012-10-11 VITALS — BP 144/92 | HR 97 | Temp 98.0°F | Ht 71.0 in | Wt 273.9 lb

## 2012-10-11 DIAGNOSIS — E785 Hyperlipidemia, unspecified: Secondary | ICD-10-CM

## 2012-10-11 DIAGNOSIS — I10 Essential (primary) hypertension: Secondary | ICD-10-CM

## 2012-10-11 DIAGNOSIS — M519 Unspecified thoracic, thoracolumbar and lumbosacral intervertebral disc disorder: Secondary | ICD-10-CM

## 2012-10-11 DIAGNOSIS — I2510 Atherosclerotic heart disease of native coronary artery without angina pectoris: Secondary | ICD-10-CM

## 2012-10-11 DIAGNOSIS — F17200 Nicotine dependence, unspecified, uncomplicated: Secondary | ICD-10-CM

## 2012-10-11 MED ORDER — LOSARTAN POTASSIUM 100 MG PO TABS: 100.0000 mg | ORAL_TABLET | Freq: Every day | ORAL | Status: DC

## 2012-10-11 MED ORDER — CLOPIDOGREL BISULFATE 75 MG PO TABS: 75.0000 mg | ORAL_TABLET | Freq: Every day | ORAL | Status: DC

## 2012-10-11 MED ORDER — ATORVASTATIN CALCIUM 80 MG PO TABS: 80.0000 mg | ORAL_TABLET | Freq: Every day | ORAL | Status: DC

## 2012-10-11 MED ORDER — GABAPENTIN 300 MG PO CAPS: 300.0000 mg | ORAL_CAPSULE | Freq: Three times a day (TID) | ORAL | Status: DC

## 2012-10-11 MED ORDER — RANOLAZINE ER 500 MG PO TB12: 500.0000 mg | ORAL_TABLET | Freq: Two times a day (BID) | ORAL | Status: DC

## 2012-10-11 MED ORDER — TRAMADOL HCL 50 MG PO TABS: 50.0000 mg | ORAL_TABLET | Freq: Three times a day (TID) | ORAL | Status: DC | PRN

## 2012-10-11 MED ORDER — FENOFIBRATE 48 MG PO TABS: 48.0000 mg | ORAL_TABLET | Freq: Every day | ORAL | Status: DC

## 2012-10-11 NOTE — Assessment & Plan Note (Signed)
Encouragement provided to continue in his quest to stop smoking. I discussed with him the fact that this tends to be a chronic disease process and that relapses are likely should not be seen as a major setback.

## 2012-10-11 NOTE — Patient Instructions (Signed)
It was great to see you today! I have sent in Tramadol for your back pain.  As we discussed, this is the strongest pain medication I am comfortable using on a chronic basis. Keep up the good work on exercise and weight loss.  Some time over the next week or two, I would like you to schedule to see Dr. Raymondo Band, our pharmacist, for a 24 hour blood pressure monitor.  Please make an appointment with Dr. Raymondo Band for 24-hour blood pressure monitoring.  What to expect at your blood pressure monitoring visit:  Please wear a short-sleeved, loose fitting shirt for the day. Try to take a shower/bathe before you come in for the appointment so that you don't have to take the monitor off during the day. On the day of your appointment, a portable blood pressure cuff will be placed on your arm and you will wear it for 24 hours. It will inflate multiple times during the day and night which will give [place] a better idea of your overall blood pressure. The next day you will return to the clinic to take the monitor off and the results of the blood pressure study will be shown to you. If medication changes are needed, they will be made that day.

## 2012-10-11 NOTE — Assessment & Plan Note (Signed)
I had a long discussion with the patient about the appropriate use of pain medication and chronic back pain. As I discussed with him I do not feel that he is likely to benefit long-term from the use of opioid pain medications. We will give a trial of tramadol which is the strongest medicine I would consider giving him on a chronic basis. I reviewed with him the role of exercise and weight loss in the management of this chronic condition.

## 2012-10-11 NOTE — Progress Notes (Signed)
Patient ID: [name], male   DOB: Feb 25, 1976, 37 y.o.   MRN: [id number] Subjective: The patient is a 37 y.o. year old male who presents today for followup.  1. Back pain: Patient was seen by orthopedic surgery who did not feel that he was a surgical candidate. Is not a candidate for injections. They recommended continued exercise. He does continue to have pain and wonders if there are any additional medications that he can be taken to help with the pain. He is trying to walk at least 4 miles several times a week and plans to join the [facility] next month.  2. Smoking cessation: The patient to stop smoking and, yesterday, finished his last dose of Chantix. He reports that he does not currently have any urges for smoking, however his significant other is still smoking at least some.  3. Hypertension: Patient remains hypertensive although he has increased his dose of losartan. He does not have any weight checking his blood pressure at home. Is not noticed any side effects at this time. Chest pain, shortness of breath, dyspnea on exertion, or lower extremity swelling.  Patient's past medical, social, and family history were reviewed and updated as appropriate. History  Substance Use Topics  . Smoking status: Current Every Day Smoker -- 0.50 packs/day    Types: Cigarettes  . Smokeless tobacco: Never Used  . Alcohol Use: No   Objective:  Filed Vitals:   10/11/12 1631  BP: 144/92  Pulse: 97  Temp: 98 F (36.7 C)   Gen: NAD, obese CV: RRR Resp: CTABL Back: Some paraspinous muscle spasm, normal gait  Assessment/Plan:  Please also see individual problems in problem list for problem-specific plans.

## 2012-10-11 NOTE — Assessment & Plan Note (Signed)
Ambulatory blood pressure monitoring to get a better idea of what his blood pressure is doing throughout the day. If he remains mildly hypertensive I will plan on increasing his metoprolol. Due to this patient's significant coronary risk factors, I would aim for a goal of 130/80.

## 2012-10-20 ENCOUNTER — Telehealth: Payer: Self-pay

## 2012-10-20 ENCOUNTER — Other Ambulatory Visit: Payer: Self-pay

## 2012-10-20 DIAGNOSIS — I2510 Atherosclerotic heart disease of native coronary artery without angina pectoris: Secondary | ICD-10-CM

## 2012-10-20 MED ORDER — RANOLAZINE ER 500 MG PO TB12: 500.0000 mg | ORAL_TABLET | Freq: Two times a day (BID) | ORAL | Status: DC

## 2012-11-03 ENCOUNTER — Ambulatory Visit: Payer: Medicaid Other | Admitting: Pharmacist

## 2012-11-22 ENCOUNTER — Emergency Department (HOSPITAL_COMMUNITY)
Admission: EM | Admit: 2012-11-22 | Discharge: 2012-11-22 | Disposition: A | Discharge status: Home / Self Care | Payer: Medicaid Other | Attending: Emergency Medicine | Admitting: Emergency Medicine

## 2012-11-22 ENCOUNTER — Telehealth (HOSPITAL_COMMUNITY): Payer: Self-pay | Admitting: Emergency Medicine

## 2012-11-22 ENCOUNTER — Encounter (HOSPITAL_COMMUNITY): Payer: Self-pay | Admitting: Emergency Medicine

## 2012-11-22 DIAGNOSIS — R209 Unspecified disturbances of skin sensation: Secondary | ICD-10-CM | POA: Insufficient documentation

## 2012-11-22 DIAGNOSIS — IMO0002 Reserved for concepts with insufficient information to code with codable children: Secondary | ICD-10-CM | POA: Insufficient documentation

## 2012-11-22 DIAGNOSIS — I10 Essential (primary) hypertension: Secondary | ICD-10-CM | POA: Insufficient documentation

## 2012-11-22 DIAGNOSIS — Z79899 Other long term (current) drug therapy: Secondary | ICD-10-CM | POA: Insufficient documentation

## 2012-11-22 DIAGNOSIS — Z951 Presence of aortocoronary bypass graft: Secondary | ICD-10-CM | POA: Insufficient documentation

## 2012-11-22 DIAGNOSIS — Z8709 Personal history of other diseases of the respiratory system: Secondary | ICD-10-CM | POA: Insufficient documentation

## 2012-11-22 DIAGNOSIS — E785 Hyperlipidemia, unspecified: Secondary | ICD-10-CM | POA: Insufficient documentation

## 2012-11-22 DIAGNOSIS — G8929 Other chronic pain: Secondary | ICD-10-CM | POA: Insufficient documentation

## 2012-11-22 DIAGNOSIS — F17200 Nicotine dependence, unspecified, uncomplicated: Secondary | ICD-10-CM | POA: Insufficient documentation

## 2012-11-22 DIAGNOSIS — I252 Old myocardial infarction: Secondary | ICD-10-CM | POA: Insufficient documentation

## 2012-11-22 DIAGNOSIS — M549 Dorsalgia, unspecified: Secondary | ICD-10-CM

## 2012-11-22 DIAGNOSIS — Z9861 Coronary angioplasty status: Secondary | ICD-10-CM | POA: Insufficient documentation

## 2012-11-22 MED ORDER — OXYCODONE-ACETAMINOPHEN 5-325 MG PO TABS
2.0000 | ORAL_TABLET | Freq: Once | ORAL | Status: AC
  Administered 2012-11-22: 2 via ORAL
  Filled 2012-11-22: qty 2

## 2012-11-22 MED ORDER — PREDNISONE 20 MG PO TABS: 60.0000 mg | ORAL_TABLET | Freq: Once | ORAL | Status: AC

## 2012-11-22 MED ORDER — PREDNISONE 20 MG PO TABS
20.0000 mg | ORAL_TABLET | Freq: Once | ORAL | Status: AC
  Administered 2012-11-22: 20 mg via ORAL
  Filled 2012-11-22: qty 1

## 2012-11-22 MED ORDER — OXYCODONE-ACETAMINOPHEN 5-325 MG PO TABS: 1.0000 | ORAL_TABLET | Freq: Once | ORAL | Status: DC

## 2012-11-22 MED ORDER — PREDNISONE 20 MG PO TABS
60.0000 mg | ORAL_TABLET | Freq: Once | ORAL | Status: AC
  Administered 2012-11-22: 40 mg via ORAL
  Filled 2012-11-22: qty 3

## 2012-11-22 NOTE — ED Notes (Signed)
Hx of recurrent back pain and pain between shoulder blades x 2 days

## 2012-11-22 NOTE — ED Provider Notes (Signed)
History     CSN: [id number]  Arrival date & time 11/22/12  1312   None     Chief Complaint  Patient presents with  . Back Pain    recurrent back pain, increased over last 2 days    (Consider location/radiation/quality/duration/timing/severity/associated sxs/prior treatment) Patient is a 37 y.o. male presenting with back pain. The history is provided by the patient. No language interpreter was used.  Back Pain Location:  Lumbar spine Associated symptoms: numbness   Associated symptoms: no bladder incontinence, no bowel incontinence and no fever   Associated symptoms comment:  Lower back pain similar in presentation to his chronic back pain, worse for the past 2 days. No new injury. He has been taking Ultram for the pain and reports that is usually effective, but not for current pain. He reports tingling into the right let.    Past Medical History  Diagnosis Date  . Hypertension   . MI (myocardial infarction)   . Hx of CABG   . Heart attack   . Bronchitis   . Tobacco user   . Hyperlipidemia   . MYOCARDIAL INFARCTION 10/13/2008    Qualifier: Diagnosis of  By: Juanda Chance, MD, Johny Chess     Past Surgical History  Procedure Laterality Date  . Coronary artery bypass graft      status post diaphragmatic wall infarction Rx BMS RCA 03-11-08   . Orthopedic surgery    . Coronary stent placement      Family History  Problem Relation Age of Onset  . Cancer Mother   . Cancer Sister   . Diabetes Mother   . Diabetes Sister   . Hyperlipidemia Mother   . Hypertension Mother   . Hyperlipidemia Sister   . Hypertension Sister   . Early death Mother   . Early death Sister     History  Substance Use Topics  . Smoking status: Current Every Day Smoker -- 0.50 packs/day    Types: Cigarettes  . Smokeless tobacco: Never Used  . Alcohol Use: No      Review of Systems  Constitutional: Negative for fever and chills.  Gastrointestinal: Negative.  Negative for bowel  incontinence.  Genitourinary: Negative for bladder incontinence.  Musculoskeletal: Positive for back pain.  Skin: Negative.   Neurological: Positive for numbness.    Allergies  Codeine and Celexa  Home Medications   Current Outpatient Rx  Name  Route  Sig  Dispense  Refill  . atorvastatin (LIPITOR) 80 MG tablet   Oral   Take 1 tablet (80 mg total) by mouth daily.   90 tablet   3   . clopidogrel (PLAVIX) 75 MG tablet   Oral   Take 1 tablet (75 mg total) by mouth daily.   90 tablet   3   . fenofibrate (TRICOR) 48 MG tablet   Oral   Take 1 tablet (48 mg total) by mouth daily.   90 tablet   3     Brand name only.   . gabapentin (NEURONTIN) 300 MG capsule   Oral   Take 1 capsule (300 mg total) by mouth 3 (three) times daily.   90 capsule   3   . losartan (COZAAR) 100 MG tablet   Oral   Take 1 tablet (100 mg total) by mouth daily.   90 tablet   3   . metoprolol tartrate (LOPRESSOR) 25 MG tablet      TAKE ONE TABLET BY MOUTH TWICE DAILY   60  tablet   5   . Misc Natural Products (OSTEO BI-FLEX TRIPLE STRENGTH PO)   Oral   Take 1 tablet by mouth 2 (two) times daily.         . Multiple Vitamin (MULITIVITAMIN WITH MINERALS) TABS   Oral   Take 1 tablet by mouth daily.         . Probiotic Product (PRO-BIOTIC BLEND PO)   Oral   Take 1 capsule by mouth daily.         . ranolazine (RANEXA) 500 MG 12 hr tablet   Oral   Take 1 tablet (500 mg total) by mouth 2 (two) times daily.   180 tablet   3   . traMADol (ULTRAM) 50 MG tablet   Oral   Take 1-2 tablets (50-100 mg total) by mouth every 8 (eight) hours as needed for pain.   90 tablet   1   . nitroGLYCERIN (NITROSTAT) 0.4 MG SL tablet   Sublingual   Place 1 tablet (0.4 mg total) under the tongue every 5 (five) minutes as needed.   25 tablet   11     BP 134/83  Pulse 91  Temp(Src) 98.6 F (37 C) (Oral)  Wt 260 lb (117.935 kg)  BMI 36.28 kg/m2  SpO2 100%  Physical Exam  Constitutional: He  is oriented to person, place, and time. He appears well-developed and well-nourished.  HENT:  Head: Normocephalic and atraumatic.  Eyes: EOM are normal. Pupils are equal, round, and reactive to light.  Neck: Normal range of motion.  Cardiovascular: Normal rate, regular rhythm and intact distal pulses.   No murmur heard. Pulmonary/Chest: Effort normal and breath sounds normal. He has no wheezes. He has no rales.  Abdominal: Soft. There is no tenderness.  Neurological: He is alert and oriented to person, place, and time. He has normal strength and normal reflexes. No sensory deficit. He displays a negative Romberg sign.  Cranial nerves 3-12 grossly intact. Ambulatory without deficit or imbalance.   Skin: Skin is warm and dry.  Psychiatric: He has a normal mood and affect.    ED Course  Procedures (including critical care time)  Labs Reviewed - No data to display No results found.   No diagnosis found.  1. Chronic low back pain 2. Lumbar radiculopathy  MDM  Chronic back pain, now radiating into right leg, without neurologic deficits. Pain addressed, steroids given, patient encouraged to follow up with PCP.        Arnoldo Hooker, PA-C 11/22/12 1421

## 2012-11-22 NOTE — ED Notes (Signed)
Call from pharmacy.  Rx for prednisone instructions not complete and Rx for Percocet, instructions not complete.  S. Upstill PA who wrote Rx has left and for Percocet clarification must be person who wrote script.  Pt to bring script back to [facility] ED.

## 2012-11-23 ENCOUNTER — Encounter: Payer: Self-pay | Admitting: Family Medicine

## 2012-11-23 ENCOUNTER — Ambulatory Visit (INDEPENDENT_AMBULATORY_CARE_PROVIDER_SITE_OTHER): Payer: Medicaid Other | Admitting: Family Medicine

## 2012-11-23 VITALS — BP 111/70 | HR 82 | Temp 98.3°F | Ht 71.0 in | Wt 264.0 lb

## 2012-11-23 DIAGNOSIS — M5430 Sciatica, unspecified side: Secondary | ICD-10-CM

## 2012-11-23 DIAGNOSIS — M5431 Sciatica, right side: Secondary | ICD-10-CM

## 2012-11-23 MED ORDER — GABAPENTIN 300 MG PO CAPS: 600.0000 mg | ORAL_CAPSULE | Freq: Three times a day (TID) | ORAL | Status: DC

## 2012-11-23 MED ORDER — CYCLOBENZAPRINE HCL 10 MG PO TABS: 10.0000 mg | ORAL_TABLET | Freq: Three times a day (TID) | ORAL | Status: DC | PRN

## 2012-11-23 NOTE — Patient Instructions (Signed)
I want you to take two of your gabapentin three times per day for the next 2 weeks.  If you find that you are too tired and sleepy when doing this, try taking one in the morning, two with lunch and two with dinner. I am refilling your flexeril.

## 2012-11-23 NOTE — ED Provider Notes (Signed)
Medical screening examination/treatment/procedure(s) were performed by non-physician practitioner and as supervising physician I was immediately available for consultation/collaboration.   Gilda Crease, MD 11/23/12 [phone]

## 2012-11-24 ENCOUNTER — Ambulatory Visit: Payer: Medicaid Other | Admitting: Pharmacist

## 2012-11-25 DIAGNOSIS — M5431 Sciatica, right side: Secondary | ICD-10-CM | POA: Insufficient documentation

## 2012-11-25 NOTE — Assessment & Plan Note (Signed)
Increase gabapentin per patient instructions. Plan is for 2-3 weeks and then titrate them. I recommended the patient avoid doing sit-ups at this time but otherwise no activity restrictions as long as they are not painful.

## 2012-11-25 NOTE — Progress Notes (Signed)
Patient ID: [name], male   DOB: [birth year], 37 y.o.   MRN: [id number] Subjective: The patient is a 37 y.o. year old male who presents today for ED followup.  1. Sciatica: Patient is an onset of sciatica 2 days ago requiring a trip to the emergency room. He was given pain medicine and a prescription for prednisone taper. Since starting the prednisone he reports that he is feeling at least a little better. He still has shooting pains down his right leg however. Is not have any bowel or bladder dysfunction. He is not taking any pain medicine because he does not want to take it.  Patient's past medical, social, and family history were reviewed and updated as appropriate. History  Substance Use Topics  . Smoking status: Current Every Day Smoker -- 0.50 packs/day    Types: Cigarettes  . Smokeless tobacco: Never Used  . Alcohol Use: No   Objective:  Filed Vitals:   11/23/12 1619  BP: 111/70  Pulse: 82  Temp: 98.3 F (36.8 C)   Gen: No acute distress, obese Back: No significant paraspinous muscle spasm. Reflexes are 2+ bilaterally. Strength is 5 out of 5 bilateral legs. There is no point tenderness in the low back.  Assessment/Plan:  Please also see individual problems in problem list for problem-specific plans.

## 2012-12-01 ENCOUNTER — Telehealth: Payer: Self-pay

## 2012-12-01 ENCOUNTER — Other Ambulatory Visit: Payer: Self-pay

## 2012-12-01 NOTE — Telephone Encounter (Signed)
Pt called into the refill line stating he needed his Atorvastatin refilled. I saw it was sent into his pharmacy in March 2014 by his PCP with 90 tablets and 3 remaining refills. I called the pharmacy and verified that the patient had refills remaining. Pt stated he will call the pharmacy in 30 minutes to 1 hour to see if Atorvastatin is ready for pick up.

## 2012-12-05 ENCOUNTER — Inpatient Hospital Stay (HOSPITAL_COMMUNITY)
Admission: EM | Admit: 2012-12-05 | Discharge: 2012-12-07 | DRG: 311 | Disposition: A | Discharge status: Home / Self Care | Payer: Medicaid Other | Attending: Cardiovascular Disease | Admitting: Cardiovascular Disease

## 2012-12-05 ENCOUNTER — Encounter (HOSPITAL_COMMUNITY): Payer: Self-pay | Admitting: Emergency Medicine

## 2012-12-05 ENCOUNTER — Emergency Department (HOSPITAL_COMMUNITY): Payer: Medicaid Other

## 2012-12-05 DIAGNOSIS — Z9861 Coronary angioplasty status: Secondary | ICD-10-CM

## 2012-12-05 DIAGNOSIS — Z885 Allergy status to narcotic agent status: Secondary | ICD-10-CM

## 2012-12-05 DIAGNOSIS — R42 Dizziness and giddiness: Secondary | ICD-10-CM

## 2012-12-05 DIAGNOSIS — I209 Angina pectoris, unspecified: Secondary | ICD-10-CM

## 2012-12-05 DIAGNOSIS — F17200 Nicotine dependence, unspecified, uncomplicated: Secondary | ICD-10-CM | POA: Diagnosis present

## 2012-12-05 DIAGNOSIS — I10 Essential (primary) hypertension: Secondary | ICD-10-CM | POA: Diagnosis present

## 2012-12-05 DIAGNOSIS — Z9181 History of falling: Secondary | ICD-10-CM

## 2012-12-05 DIAGNOSIS — E669 Obesity, unspecified: Secondary | ICD-10-CM | POA: Diagnosis present

## 2012-12-05 DIAGNOSIS — I200 Unstable angina: Secondary | ICD-10-CM

## 2012-12-05 DIAGNOSIS — Z79899 Other long term (current) drug therapy: Secondary | ICD-10-CM

## 2012-12-05 DIAGNOSIS — E782 Mixed hyperlipidemia: Secondary | ICD-10-CM | POA: Diagnosis present

## 2012-12-05 DIAGNOSIS — R079 Chest pain, unspecified: Secondary | ICD-10-CM

## 2012-12-05 DIAGNOSIS — F411 Generalized anxiety disorder: Secondary | ICD-10-CM | POA: Diagnosis present

## 2012-12-05 DIAGNOSIS — Z6835 Body mass index (BMI) 35.0-35.9, adult: Secondary | ICD-10-CM

## 2012-12-05 DIAGNOSIS — Z8249 Family history of ischemic heart disease and other diseases of the circulatory system: Secondary | ICD-10-CM

## 2012-12-05 DIAGNOSIS — E785 Hyperlipidemia, unspecified: Secondary | ICD-10-CM | POA: Diagnosis present

## 2012-12-05 DIAGNOSIS — R0602 Shortness of breath: Secondary | ICD-10-CM | POA: Diagnosis present

## 2012-12-05 DIAGNOSIS — I252 Old myocardial infarction: Secondary | ICD-10-CM

## 2012-12-05 DIAGNOSIS — Z888 Allergy status to other drugs, medicaments and biological substances status: Secondary | ICD-10-CM

## 2012-12-05 DIAGNOSIS — E876 Hypokalemia: Secondary | ICD-10-CM

## 2012-12-05 DIAGNOSIS — F419 Anxiety disorder, unspecified: Secondary | ICD-10-CM | POA: Diagnosis present

## 2012-12-05 DIAGNOSIS — I2510 Atherosclerotic heart disease of native coronary artery without angina pectoris: Secondary | ICD-10-CM | POA: Diagnosis present

## 2012-12-05 DIAGNOSIS — Z809 Family history of malignant neoplasm, unspecified: Secondary | ICD-10-CM

## 2012-12-05 DIAGNOSIS — E66812 Obesity, class 2: Secondary | ICD-10-CM | POA: Diagnosis present

## 2012-12-05 LAB — CBC WITH DIFFERENTIAL/PLATELET
Basophils Absolute: 0 10*3/uL (ref 0.0–0.1)
Eosinophils Absolute: 0.2 10*3/uL (ref 0.0–0.7)
Eosinophils Relative: 3 % (ref 0–5)
Lymphocytes Relative: 31 % (ref 12–46)
MCV: 91.7 fL (ref 78.0–100.0)
Platelets: 269 10*3/uL (ref 150–400)
RDW: 13.4 % (ref 11.5–15.5)
WBC: 7.4 10*3/uL (ref 4.0–10.5)

## 2012-12-05 LAB — POCT I-STAT, CHEM 8
Calcium, Ion: 1.16 mmol/L (ref 1.12–1.23)
Glucose, Bld: 90 mg/dL (ref 70–99)
HCT: 43 % (ref 39.0–52.0)
Hemoglobin: 14.6 g/dL (ref 13.0–17.0)
Potassium: 3.8 mEq/L (ref 3.5–5.1)
TCO2: 23 mmol/L (ref 0–100)

## 2012-12-05 LAB — POCT I-STAT TROPONIN I: Troponin i, poc: 0 ng/mL (ref 0.00–0.08)

## 2012-12-05 MED ORDER — NITROGLYCERIN 0.4 MG SL SUBL
0.4000 mg | SUBLINGUAL_TABLET | SUBLINGUAL | Status: AC | PRN
  Administered 2012-12-05 (×3): 0.4 mg via SUBLINGUAL
  Filled 2012-12-05: qty 25

## 2012-12-05 MED ORDER — GABAPENTIN 300 MG PO CAPS: 600.0000 mg | ORAL_CAPSULE | Freq: Three times a day (TID) | ORAL | Status: DC

## 2012-12-05 MED ORDER — ATORVASTATIN CALCIUM 80 MG PO TABS
80.0000 mg | ORAL_TABLET | Freq: Every evening | ORAL | Status: DC
  Administered 2012-12-06 – 2012-12-07 (×2): 80 mg via ORAL
  Filled 2012-12-05 (×2): qty 1

## 2012-12-05 MED ORDER — ACETAMINOPHEN 325 MG PO TABS
650.0000 mg | ORAL_TABLET | ORAL | Status: DC | PRN
  Administered 2012-12-06 (×2): 650 mg via ORAL
  Filled 2012-12-05 (×2): qty 2

## 2012-12-05 MED ORDER — RANOLAZINE ER 500 MG PO TB12
500.0000 mg | ORAL_TABLET | Freq: Two times a day (BID) | ORAL | Status: DC
  Administered 2012-12-06 – 2012-12-07 (×4): 500 mg via ORAL
  Filled 2012-12-05 (×5): qty 1

## 2012-12-05 MED ORDER — MORPHINE SULFATE 4 MG/ML IJ SOLN
4.0000 mg | Freq: Once | INTRAMUSCULAR | Status: AC
  Administered 2012-12-05: 4 mg via INTRAVENOUS
  Filled 2012-12-05: qty 1

## 2012-12-05 MED ORDER — GABAPENTIN 600 MG PO TABS
600.0000 mg | ORAL_TABLET | Freq: Three times a day (TID) | ORAL | Status: DC
  Administered 2012-12-06 – 2012-12-07 (×5): 600 mg via ORAL
  Filled 2012-12-05 (×7): qty 1

## 2012-12-05 MED ORDER — CYCLOBENZAPRINE HCL 10 MG PO TABS
10.0000 mg | ORAL_TABLET | Freq: Three times a day (TID) | ORAL | Status: DC | PRN
  Filled 2012-12-05: qty 1

## 2012-12-05 MED ORDER — FENOFIBRATE 54 MG PO TABS
54.0000 mg | ORAL_TABLET | Freq: Every day | ORAL | Status: DC
  Administered 2012-12-06 – 2012-12-07 (×2): 54 mg via ORAL
  Filled 2012-12-05 (×2): qty 1

## 2012-12-05 MED ORDER — HEPARIN BOLUS VIA INFUSION
4000.0000 [IU] | Freq: Once | INTRAVENOUS | Status: AC
  Administered 2012-12-05: 4000 [IU] via INTRAVENOUS

## 2012-12-05 MED ORDER — CLOPIDOGREL BISULFATE 75 MG PO TABS
75.0000 mg | ORAL_TABLET | Freq: Every day | ORAL | Status: DC
  Administered 2012-12-06 – 2012-12-07 (×2): 75 mg via ORAL
  Filled 2012-12-05 (×2): qty 1

## 2012-12-05 MED ORDER — NITROGLYCERIN IN D5W 200-5 MCG/ML-% IV SOLN
5.0000 ug/min | INTRAVENOUS | Status: DC
  Administered 2012-12-05: 5 ug/min via INTRAVENOUS
  Filled 2012-12-05: qty 250

## 2012-12-05 MED ORDER — ALPRAZOLAM 0.5 MG PO TABS: 0.5000 mg | ORAL_TABLET | Freq: Three times a day (TID) | ORAL | Status: DC | PRN

## 2012-12-05 MED ORDER — CYCLOBENZAPRINE HCL 10 MG PO TABS: 10.0000 mg | ORAL_TABLET | Freq: Three times a day (TID) | ORAL | Status: DC | PRN

## 2012-12-05 MED ORDER — HEPARIN (PORCINE) IN NACL 100-0.45 UNIT/ML-% IJ SOLN
1800.0000 [IU]/h | INTRAMUSCULAR | Status: DC
  Administered 2012-12-05: 1400 [IU]/h via INTRAVENOUS
  Administered 2012-12-06: 1800 [IU]/h via INTRAVENOUS
  Filled 2012-12-05 (×3): qty 250

## 2012-12-05 MED ORDER — LOSARTAN POTASSIUM 50 MG PO TABS
100.0000 mg | ORAL_TABLET | Freq: Every morning | ORAL | Status: DC
  Administered 2012-12-06 – 2012-12-07 (×2): 100 mg via ORAL
  Filled 2012-12-05 (×2): qty 2

## 2012-12-05 NOTE — H&P (Signed)
Physician History and Physical    [name] MRN: [id number] DOB/AGE: [birth year] 37 y.o. Admit date: 12/05/2012  Primary Cardiologist: Dr. Clifton James   CC:  Chest pain  HPI:  Pt is a 37 yo man with HTN, CAD s/p CABG 2009 and subsequent PCI to RCA for MI per pt report, chronic chest pain who presents with chest pain.  Pt reports that he has daily chest pain that he describes as a pressure.  The pain episode that he had today was above his baseline in terms of intensity and felt like something sitting on his chest.  He was in bed all day with the pain and it didn't improve, so he came to the hospital.  He reports compliance with his medications at home.  He did not take an aspirin and refuses to take aspirin due to GI intolerance and possible hx of a GI bleed.  He also reports that he feels SOB all the time.  Additionally, for the past week he has been feeling dizzy, and has fallen due to his dizziness.  He also reports weakness in his arms and legs and blurred vision. He denies any neurologic sx currently.  He has chest pressure currently, but this is back to his chronic baseline.  He also has pan-positive review of systems.      Review of systems: A review of 10 organ systems was done and is negative except as stated above in HPI  Past Medical History  Diagnosis Date  . Hypertension   . MI (myocardial infarction)   . Hx of CABG   . Heart attack   . Bronchitis   . Tobacco user   . Hyperlipidemia   . MYOCARDIAL INFARCTION 10/13/2008    Qualifier: Diagnosis of  By: Juanda Chance, MD, Johny Chess    Past Surgical History  Procedure Laterality Date  . Coronary artery bypass graft      status post diaphragmatic wall infarction Rx BMS RCA 03-11-08   . Orthopedic surgery    . Coronary stent placement     History   Social History  . Marital Status: Divorced    Spouse Name: N/A    Number of Children: N/A  . Years of Education: N/A   Occupational History  . Not on file.    Social History Main Topics  . Smoking status: Current Every Day Smoker -- 0.20 packs/day for 20 years    Types: Cigarettes  . Smokeless tobacco: Never Used  . Alcohol Use: No     Comment: maybe two beers per month  . Drug Use: No  . Sexually Active: Not on file   Other Topics Concern  . Not on file   Social History Narrative  . No narrative on file    Family History  Problem Relation Age of Onset  . Cancer Mother   . Cancer Sister   . Diabetes Mother   . Diabetes Sister   . Hyperlipidemia Mother   . Hypertension Mother   . Hyperlipidemia Sister   . Hypertension Sister   . Early death Mother   . Early death Sister      Allergies  Allergen Reactions  . Codeine Itching  . Celexa (Citalopram) Itching and Rash    Prescriptions prior to admission  Medication Sig Dispense Refill  . acidophilus (RISAQUAD) CAPS Take 1 capsule by mouth daily.      Marland Kitchen ALPRAZolam (XANAX) 0.5 MG tablet Take 0.5 mg by mouth 3 (three)  times daily as needed for anxiety.      Marland Kitchen atorvastatin (LIPITOR) 80 MG tablet Take 80 mg by mouth every evening.      . clopidogrel (PLAVIX) 75 MG tablet Take 75 mg by mouth every morning.      . cyclobenzaprine (FLEXERIL) 10 MG tablet Take 10 mg by mouth 3 (three) times daily as needed for muscle spasms.      . fenofibrate (TRICOR) 48 MG tablet Take 48 mg by mouth every morning.      . gabapentin (NEURONTIN) 300 MG capsule Take 2 capsules (600 mg total) by mouth 3 (three) times daily.  180 capsule  0  . losartan (COZAAR) 100 MG tablet Take 100 mg by mouth every morning.      . Misc Natural Products (OSTEO BI-FLEX TRIPLE STRENGTH PO) Take 1 tablet by mouth 3 (three) times daily.       . Multiple Vitamin (MULITIVITAMIN WITH MINERALS) TABS Take 1 tablet by mouth every morning.       . nitroGLYCERIN (NITROSTAT) 0.4 MG SL tablet Place 0.4 mg under the tongue every 5 (five) minutes as needed for chest pain.      . ranolazine (RANEXA) 500 MG 12 hr tablet Take 1 tablet (500  mg total) by mouth 2 (two) times daily.  180 tablet  3    Current facility-administered medications:heparin ADULT infusion 100 units/mL (25000 units/250 mL), 1,400 Units/hr, Intravenous, Continuous, Rolan Bucco, MD, Last Rate: 14 mL/hr at 12/05/12 2114, 1,400 Units/hr at 12/05/12 2114;  nitroGLYCERIN 0.2 mg/mL in dextrose 5 % infusion, 5 mcg/min, Intravenous, Titrated, Rolan Bucco, MD, Last Rate: 4.5 mL/hr at 12/05/12 2205, 15 mcg/min at 12/05/12 2205  Physical Exam: Blood pressure 138/82, pulse 76, temperature 97.8 F (36.6 C), temperature source Oral, resp. rate 20, height 5\' 11"  (1.803 m), weight 117.935 kg (260 lb), SpO2 100.00%.; Body mass index is 36.28 kg/(m^2). Temp:  [97.8 F (36.6 C)] 97.8 F (36.6 C) (05/19 1820) Pulse Rate:  [76-86] 76 (05/19 1915) Resp:  [8-20] 20 (05/19 2200) BP: (105-147)/(62-88) 138/82 mmHg (05/19 2200) SpO2:  [95 %-100 %] 100 % (05/19 1915) Weight:  [117.935 kg (260 lb)] 117.935 kg (260 lb) (05/19 2012)  No intake or output data in the 24 hours ending 12/05/12 2213 General: NAD Heent: MMM Neck: No JVD  CV: Nondisplaced PMI.  RRR, nl S1/S2, no S3/S4, no murmur.  +chest wall tenderness to palpation  Lungs: Clear to auscultation bilaterally with normal respiratory effort Abdomen: Soft, nontender, nondistended Extremities: No clubbing or cyanosis.  No pedal edema Skin: Intact without lesions or rashes  Neurologic: Alert and oriented x 3, grossly nonfocal  Psych: Normal mood and affect    Labs:  Lab Results  Component Value Date   WBC 7.4 12/05/2012   HGB 14.6 12/05/2012   HCT 43.0 12/05/2012   MCV 91.7 12/05/2012   PLT 269 12/05/2012    Recent Labs Lab 12/05/12 1814  NA 141  K 3.8  CL 109  BUN 4*  CREATININE 1.00  GLUCOSE 90   Lab Results  Component Value Date   CHOL 197 06/15/2012   HDL 39.80 06/15/2012   LDLCALC UNABLE TO CALCULATE IF TRIGLYCERIDE OVER 400 mg/dL 07/25/1094   TRIG 045.4* 06/15/2012   Troponin (Point of Care  Test)  Recent Labs  12/05/12 1813  TROPIPOC 0.00    EKG: sinus, inf q waves, no ischemic changes  Radiology:  Dg Chest 2 View  12/05/2012   *RADIOLOGY REPORT*  Clinical  Data: Chest pain.  Short of breath.  Cough.  Coronary artery disease.  CHEST - 2 VIEW  Comparison: 10/18/2011  Findings: Heart size is normal.  Both lungs are clear.  No evidence of pleural effusion.  No mass or lymphadenopathy identified.  Prior CABG again noted.  IMPRESSION: Stable exam.  No active disease.   Original Report Authenticated By: Myles Rosenthal, M.D.    ASSESSMENT:   Pt is a 37 yo man with HTN, CAD s/p CABG 2009 and subsequent PCI to RCA for MI per pt report, chronic chest pain who presents with chest pain  PLAN:  Chest pain - acute on chronic worsening.  Pt is back to baseline pain/pressure.  Chest wall is tender to palpation on exam, so must consider MSK etiology.  Given extensive cardiac hx and early CAD, must also consider ACS as a cause of his pain - first enzymes negative, will trend - EKG with no ischemia - daily EKGs - continue heparin and nitro gtt for now - NPO p MN for possible stress vs cath in am (pt reports that last cath was about a year ago and he was told that there was nothing more that could be revascularized) - continue home meds: plavix (pt refuses aspirin due to GI intolerance), ranolazine, statin, ARB  HTN - continue home meds  HLD - check lipids, continue home meds  Neurologic sx (blurry vision, falls, weakness) - check noncontrast head CT, consider outpt neuro eval.  Sx have occurred over the last week, but are currently resolved  Tobacco abuse - pt continues to smoke 3 cigs per day to calm his anxiety  Ppx - heparin gtt  Dispo - admit to [facility], NPO p MN for possible tests in am  Signed: Hilary Hertz, MD Cardiology Fellow 12/05/2012, 10:13 PM

## 2012-12-05 NOTE — ED Notes (Signed)
Pt states that he has had chest pressure/pain since this morning at 530am. States that he also is having trouble with his motor function in his hands. Reports hx of chronic back pain. Also states that he has been in bed all day and feels like he is "high" but reports no drug use.

## 2012-12-05 NOTE — Progress Notes (Signed)
ANTICOAGULATION CONSULT NOTE - Initial Consult  Pharmacy Consult for Heparin IV Indication: chest pain/ACS  Allergies  Allergen Reactions  . Codeine Itching  . Celexa (Citalopram) Itching and Rash    Patient Measurements: Height: 5\' 11"  (180.3 cm) Weight: 260 lb (117.935 kg) IBW/kg (Calculated) : 75.3 Heparin Dosing Weight: 101 kg  Vital Signs: Temp: 97.8 F (36.6 C) (05/19 1820) Temp src: Oral (05/19 1820) BP: 114/72 mmHg (05/19 1915) Pulse Rate: 76 (05/19 1915)  Labs:  Recent Labs  12/05/12 1805 12/05/12 1814  HGB 15.1 14.6  HCT 43.0 43.0  PLT 269  --   CREATININE  --  1.00   Estimated Creatinine Clearance: 133.3 ml/min (by C-G formula based on Cr of 1).   Medical History: Past Medical History  Diagnosis Date  . Hypertension   . MI (myocardial infarction)   . Hx of CABG   . Heart attack   . Bronchitis   . Tobacco user   . Hyperlipidemia   . MYOCARDIAL INFARCTION 10/13/2008    Qualifier: Diagnosis of  By: Juanda Chance, MD, Johny Chess     Medications:  Infusions:  . heparin    . heparin    . nitroGLYCERIN      Assessment: 37 yo M admit with chest pain, pressure, and SOB concerning for ACS.  PMH includes CAD, CABG, and stenting.  Pharmacy asked to dose IV heparin.  CBC: baseline Hgb and Plt wnl  Coags: pending  Goal of Therapy:  Heparin level 0.3-0.7 units/ml Monitor platelets by anticoagulation protocol: Yes   Plan:   Baseline PTT, PT/INR  Give heparin 4000 units bolus IV x 1  Start heparin IV infusion at 1400 units/hr  Heparin level 6 hours after starting  Daily heparin level and CBC - continue to monitor platelets

## 2012-12-05 NOTE — ED Provider Notes (Signed)
History     CSN: [id number]  Arrival date & time 12/05/12  1731   First MD Initiated Contact with Patient 12/05/12 1737      Chief Complaint  Patient presents with  . Chest Pain    (Consider location/radiation/quality/duration/timing/severity/associated sxs/prior treatment) HPI Comments: Patient history of coronary artery disease presents with chest pain. He states he had chest pressure across his chest that started this morning. He states it's been constant throughout the day. He also feels short of breath. He denies he nausea or vomiting. He states he was diaphoretic yesterday but denies any diaphoresis today. He states it's similar to his past episodes of angina. He does have range of motion at home but he did not take this as he says he gets them about headache. He also feels weak all over. He states his arms and his legs both feel very weak and tingly. It's not worse on one side versus the other. He has a bifrontal-type headache. He denies any vision changes or slurred speech. He states the pain is across his chest but is otherwise nonradiating. He is status post a four-vessel CABG as well as multiple stenting.  Patient is a 37 y.o. male presenting with chest pain.  Chest Pain Associated symptoms: fatigue, numbness, shortness of breath and weakness   Associated symptoms: no abdominal pain, no back pain, no cough, no diaphoresis, no dizziness, no fever, no headache, no nausea and not vomiting     Past Medical History  Diagnosis Date  . Hypertension   . MI (myocardial infarction)   . Hx of CABG   . Heart attack   . Bronchitis   . Tobacco user   . Hyperlipidemia   . MYOCARDIAL INFARCTION 10/13/2008    Qualifier: Diagnosis of  By: Juanda Chance, MD, Johny Chess     Past Surgical History  Procedure Laterality Date  . Coronary artery bypass graft      status post diaphragmatic wall infarction Rx BMS RCA 03-11-08   . Orthopedic surgery    . Coronary stent placement      Family  History  Problem Relation Age of Onset  . Cancer Mother   . Cancer Sister   . Diabetes Mother   . Diabetes Sister   . Hyperlipidemia Mother   . Hypertension Mother   . Hyperlipidemia Sister   . Hypertension Sister   . Early death Mother   . Early death Sister     History  Substance Use Topics  . Smoking status: Current Every Day Smoker -- 0.50 packs/day    Types: Cigarettes  . Smokeless tobacco: Never Used  . Alcohol Use: No      Review of Systems  Constitutional: Positive for fatigue. Negative for fever, chills and diaphoresis.  HENT: Negative for congestion, rhinorrhea and sneezing.   Eyes: Negative.   Respiratory: Positive for shortness of breath. Negative for cough and chest tightness.   Cardiovascular: Positive for chest pain. Negative for leg swelling.  Gastrointestinal: Negative for nausea, vomiting, abdominal pain, diarrhea and blood in stool.  Genitourinary: Negative for frequency, hematuria, flank pain and difficulty urinating.  Musculoskeletal: Negative for back pain and arthralgias.  Skin: Negative for rash.  Neurological: Positive for weakness and numbness. Negative for dizziness, speech difficulty and headaches.    Allergies  Codeine and Celexa  Home Medications   Current Outpatient Rx  Name  Route  Sig  Dispense  Refill  . acidophilus (RISAQUAD) CAPS   Oral   Take 1 capsule  by mouth daily.         Marland Kitchen ALPRAZolam (XANAX) 0.5 MG tablet   Oral   Take 0.5 mg by mouth 3 (three) times daily as needed for anxiety.         Marland Kitchen atorvastatin (LIPITOR) 80 MG tablet   Oral   Take 80 mg by mouth every evening.         . clopidogrel (PLAVIX) 75 MG tablet   Oral   Take 75 mg by mouth every morning.         . cyclobenzaprine (FLEXERIL) 10 MG tablet   Oral   Take 10 mg by mouth 3 (three) times daily as needed for muscle spasms.         . fenofibrate (TRICOR) 48 MG tablet   Oral   Take 48 mg by mouth every morning.         . gabapentin  (NEURONTIN) 300 MG capsule   Oral   Take 2 capsules (600 mg total) by mouth 3 (three) times daily.   180 capsule   0   . losartan (COZAAR) 100 MG tablet   Oral   Take 100 mg by mouth every morning.         . Misc Natural Products (OSTEO BI-FLEX TRIPLE STRENGTH PO)   Oral   Take 1 tablet by mouth 3 (three) times daily.          . Multiple Vitamin (MULITIVITAMIN WITH MINERALS) TABS   Oral   Take 1 tablet by mouth every morning.          . nitroGLYCERIN (NITROSTAT) 0.4 MG SL tablet   Sublingual   Place 0.4 mg under the tongue every 5 (five) minutes as needed for chest pain.         . ranolazine (RANEXA) 500 MG 12 hr tablet   Oral   Take 1 tablet (500 mg total) by mouth 2 (two) times daily.   180 tablet   3     BP 114/72  Pulse 76  Temp(Src) 97.8 F (36.6 C) (Oral)  Resp 16  Ht 5\' 11"  (1.803 m)  Wt 260 lb (117.935 kg)  BMI 36.28 kg/m2  SpO2 100%  Physical Exam  Constitutional: He is oriented to person, place, and time. He appears well-developed and well-nourished.  HENT:  Head: Normocephalic and atraumatic.  Eyes: Pupils are equal, round, and reactive to light.  Neck: Normal range of motion. Neck supple.  Cardiovascular: Normal rate, regular rhythm and normal heart sounds.   Pulmonary/Chest: Effort normal and breath sounds normal. No respiratory distress. He has no wheezes. He has no rales. He exhibits no tenderness.  Abdominal: Soft. Bowel sounds are normal. There is no tenderness. There is no rebound and no guarding.  Musculoskeletal: Normal range of motion. He exhibits no edema.  Lymphadenopathy:    He has no cervical adenopathy.  Neurological: He is alert and oriented to person, place, and time. He has normal strength. No cranial nerve deficit or sensory deficit. GCS eye subscore is 4. GCS verbal subscore is 5. GCS motor subscore is 6.  Finger to nose intact  Skin: Skin is warm and dry. No rash noted.  Psychiatric: He has a normal mood and affect.     ED Course  Procedures (including critical care time)   Date: 12/05/2012  Rate: 79  Rhythm: normal sinus rhythm  QRS Axis: normal  Intervals: normal  ST/T Wave abnormalities: normal  Conduction Disutrbances:none  Narrative Interpretation:   Old  EKG Reviewed: unchanged     1. Unstable angina       MDM  Patient with chest pain which was initially relieved with nitroglycerin. The pain subsequently came back and patient was started on nitroglycerin drip. He was not started on aspirin given an allergic reaction in the past with rash and itching. He's currently on Plavix. I discussed the case with Dr. Diona Browner who is on call for low-power cardiology and he recommended to have the patient transferred to [facility]. The patient was started on a heparin drip. He will likely need further evaluation such as a stress test or cardiac catheterization.  CRITICAL CARE Performed by: Makoa Satz Total critical care time: 45 Critical care time was exclusive of separately billable procedures and treating other patients. Critical care was necessary to treat or prevent imminent or life-threatening deterioration. Critical care was time spent personally by me on the following activities: development of treatment plan with patient and/or surrogate as well as nursing, discussions with consultants, evaluation of patient's response to treatment, examination of patient, obtaining history from patient or surrogate, ordering and performing treatments and interventions, ordering and review of laboratory studies, ordering and review of radiographic studies, pulse oximetry and re-evaluation of patient's condition.  Patient had unstable angina with a significant cardiac history. He was maintained on nitroglycerin drip which was titrated to pain control. He was also started on a heparin drip which also was monitored. Given his history he needed emergent cardiac evaluation       Rolan Bucco, MD 12/05/12  2025

## 2012-12-06 ENCOUNTER — Encounter (HOSPITAL_COMMUNITY): Payer: Self-pay | Admitting: *Deleted

## 2012-12-06 ENCOUNTER — Inpatient Hospital Stay (HOSPITAL_COMMUNITY): Payer: Medicaid Other

## 2012-12-06 DIAGNOSIS — I2510 Atherosclerotic heart disease of native coronary artery without angina pectoris: Secondary | ICD-10-CM

## 2012-12-06 LAB — BASIC METABOLIC PANEL
CO2: 26 mEq/L (ref 19–32)
Calcium: 9.2 mg/dL (ref 8.4–10.5)
Creatinine, Ser: 1.19 mg/dL (ref 0.50–1.35)
Glucose, Bld: 159 mg/dL — ABNORMAL HIGH (ref 70–99)

## 2012-12-06 LAB — CBC
HCT: 39.5 % (ref 39.0–52.0)
Hemoglobin: 13.5 g/dL (ref 13.0–17.0)
MCH: 31.6 pg (ref 26.0–34.0)
MCV: 92.5 fL (ref 78.0–100.0)
RBC: 4.27 MIL/uL (ref 4.22–5.81)

## 2012-12-06 LAB — TROPONIN I
Troponin I: <0.3 ng/mL (ref <0.30)
Troponin I: <0.3 ng/mL (ref <0.30)

## 2012-12-06 LAB — PROTIME-INR: INR: 0.99 (ref 0.00–1.49)

## 2012-12-06 LAB — LIPID PANEL: Cholesterol: 146 mg/dL (ref 0–200)

## 2012-12-06 LAB — HEPARIN LEVEL (UNFRACTIONATED): Heparin Unfractionated: 0.19 IU/mL — ABNORMAL LOW (ref 0.30–0.70)

## 2012-12-06 MED ORDER — NICOTINE 21 MG/24HR TD PT24
21.0000 mg | MEDICATED_PATCH | Freq: Every day | TRANSDERMAL | Status: DC
  Administered 2012-12-06 – 2012-12-07 (×2): 21 mg via TRANSDERMAL
  Filled 2012-12-06 (×2): qty 1

## 2012-12-06 MED ORDER — POTASSIUM CHLORIDE CRYS ER 20 MEQ PO TBCR
40.0000 meq | EXTENDED_RELEASE_TABLET | Freq: Once | ORAL | Status: AC
  Administered 2012-12-06: 40 meq via ORAL
  Filled 2012-12-06: qty 2

## 2012-12-06 MED ORDER — SODIUM CHLORIDE 0.9 % IV SOLN
INTRAVENOUS | Status: DC
  Administered 2012-12-06: 04:00:00 via INTRAVENOUS

## 2012-12-06 MED ORDER — HEPARIN BOLUS VIA INFUSION
2000.0000 [IU] | Freq: Once | INTRAVENOUS | Status: AC
  Administered 2012-12-06: 2000 [IU] via INTRAVENOUS
  Filled 2012-12-06: qty 2000

## 2012-12-06 NOTE — Progress Notes (Signed)
ANTICOAGULATION CONSULT NOTE - Follow Up Consult  Pharmacy Consult for Heparin IV Indication: chest pain/ACS  Allergies  Allergen Reactions  . Codeine Itching  . Celexa (Citalopram) Itching and Rash    Patient Measurements: Height: 5\' 11"  (180.3 cm) Weight: 260 lb (117.935 kg) IBW/kg (Calculated) : 75.3 Heparin Dosing Weight: 101 kg  Vital Signs: Temp: 97.4 F (36.3 C) (05/20 0400) Temp src: Oral (05/20 0400) BP: 103/64 mmHg (05/20 0400) Pulse Rate: 78 (05/20 0400)  Labs:  Recent Labs  12/05/12 1805 12/05/12 1814 12/05/12 2100 12/05/12 2337 12/06/12 0233  HGB 15.1 14.6  --   --  13.5  HCT 43.0 43.0  --   --  39.5  PLT 269  --   --   --  241  APTT  --   --  34  --   --   LABPROT  --   --  12.6  --  13.0  INR  --   --  0.95  --  0.99  HEPARINUNFRC  --   --   --   --  0.19*  CREATININE  --  1.00  --   --  1.19  TROPONINI  --   --   --  <0.30  --    Estimated Creatinine Clearance: 112 ml/min (by C-G formula based on Cr of 1.19).   Medical History: Past Medical History  Diagnosis Date  . Hypertension   . MI (myocardial infarction)   . Hx of CABG   . Heart attack   . Bronchitis   . Tobacco user   . Hyperlipidemia   . MYOCARDIAL INFARCTION 10/13/2008    Qualifier: Diagnosis of  By: Juanda Chance, MD, Johny Chess     Medications:  Infusions:  . sodium chloride 10 mL/hr at 12/06/12 0413  . heparin 1,400 Units/hr (12/05/12 2200)  . nitroGLYCERIN 15 mcg/min (12/06/12 0400)    Assessment: 37 yo M admit with chest pain, pressure, and SOB concerning for ACS.  PMH includes CAD, CABG, and stenting.  Pharmacy asked to dose IV heparin. Heparin level (0.19) is below-goal on 1400 units/hr. No problem with line / infusion and no bleeding per RN.   Goal of Therapy:  Heparin level 0.3-0.7 units/ml Monitor platelets by anticoagulation protocol: Yes   Plan:  1. Heparin 2000 unit IV bolus, then increase IV heparin to 1800 units/hr.  2. Heparin level in 6  hours  Lorre Munroe, PharmD 12/06/12, 04:55

## 2012-12-06 NOTE — Progress Notes (Signed)
Transferred to 3040 by wheelchair, stable, report given to RN. Belongings with spouse.

## 2012-12-06 NOTE — Care Management Note (Signed)
    Page 1 of 1   12/06/2012     8:49:40 AM   CARE MANAGEMENT NOTE 12/06/2012  Patient:  [name], [name]   Account Number:  [id number]  Date Initiated:  12/06/2012  Documentation initiated by:  Junius Creamer  Subjective/Objective Assessment:   adm w angina     Action/Plan:   lives w fam, pcp dr Rolm Gala ritch   Anticipated DC Date:     Anticipated DC Plan:        DC Planning Services  CM consult      Choice offered to / List presented to:             Status of service:   Medicare Important Message given?   (If response is "NO", the following Medicare IM given date fields will be blank) Date Medicare IM given:   Date Additional Medicare IM given:    Discharge Disposition:    Per UR Regulation:  Reviewed for med. necessity/level of care/duration of stay  If discussed at Long Length of Stay Meetings, dates discussed:    Comments:

## 2012-12-06 NOTE — Progress Notes (Signed)
    SUBJECTIVE: No chest pain this am.   BP 103/64  Pulse 78  Temp(Src) 97.4 F (36.3 C) (Oral)  Resp 10  Ht 5\' 11"  (1.803 m)  Wt 260 lb (117.935 kg)  BMI 36.28 kg/m2  SpO2 97%  Intake/Output Summary (Last 24 hours) at 12/06/12 0745 Last data filed at 12/06/12 0400  Gross per 24 hour  Intake 434.29 ml  Output      0 ml  Net 434.29 ml    PHYSICAL EXAM General: Well developed, well nourished, in no acute distress. Alert and oriented x 3.  Psych:  Good affect, responds appropriately Neck: No JVD. No masses noted.  Lungs: Clear bilaterally with no wheezes or rhonci noted.  Heart: RRR with no murmurs noted. Abdomen: Bowel sounds are present. Soft, non-tender.  Extremities: No lower extremity edema.   LABS: Basic Metabolic Panel:  Recent Labs  04/54/09 1814 12/06/12 0233  NA 141 140  K 3.8 3.5  CL 109 105  CO2  --  26  GLUCOSE 90 159*  BUN 4* 8  CREATININE 1.00 1.19  CALCIUM  --  9.2   CBC:  Recent Labs  12/05/12 1805 12/05/12 1814 12/06/12 0233  WBC 7.4  --  7.9  NEUTROABS 4.3  --   --   HGB 15.1 14.6 13.5  HCT 43.0 43.0 39.5  MCV 91.7  --  92.5  PLT 269  --  241   Cardiac Enzymes:  Recent Labs  12/05/12 2337 12/06/12 0543  TROPONINI <0.30 <0.30   Fasting Lipid Panel:  Recent Labs  12/06/12 0233  CHOL 146  HDL 34*  LDLCALC 55  TRIG 811*  CHOLHDL 4.3    Current Meds: . atorvastatin  80 mg Oral QPM  . clopidogrel  75 mg Oral Q breakfast  . fenofibrate  54 mg Oral Daily  . gabapentin  600 mg Oral TID  . losartan  100 mg Oral q morning - 10a  . ranolazine  500 mg Oral BID     ASSESSMENT AND PLAN:  1. CAD: Admitted with chest pain. He is known to have severe CAD. Last cath 10/19/11. Cardiac markers negative.  EKG without ischemic changes. Feels better this am. Will d/c NTG and heparin drips. Transfer to tele. Ambulate. If has recurrent pain, will plan cath tomorrow.  I do not think a stress test would be helpful knowing his disease.  Replace K.   Edmonia Gonser  5/20/20147:45 AM

## 2012-12-07 DIAGNOSIS — E876 Hypokalemia: Secondary | ICD-10-CM

## 2012-12-07 DIAGNOSIS — I2510 Atherosclerotic heart disease of native coronary artery without angina pectoris: Secondary | ICD-10-CM

## 2012-12-07 DIAGNOSIS — I209 Angina pectoris, unspecified: Secondary | ICD-10-CM

## 2012-12-07 DIAGNOSIS — R42 Dizziness and giddiness: Secondary | ICD-10-CM

## 2012-12-07 LAB — CBC
HCT: 42.1 % (ref 39.0–52.0)
Hemoglobin: 14.5 g/dL (ref 13.0–17.0)
RBC: 4.53 MIL/uL (ref 4.22–5.81)
WBC: 6.7 10*3/uL (ref 4.0–10.5)

## 2012-12-07 LAB — BASIC METABOLIC PANEL
BUN: 8 mg/dL (ref 6–23)
CO2: 21 mEq/L (ref 19–32)
Chloride: 106 mEq/L (ref 96–112)
GFR calc non Af Amer: >90 mL/min (ref >90)
Glucose, Bld: 142 mg/dL — ABNORMAL HIGH (ref 70–99)
Potassium: 4.2 mEq/L (ref 3.5–5.1)
Sodium: 138 mEq/L (ref 135–145)

## 2012-12-07 MED ORDER — NICOTINE 7 MG/24HR TD PT24: 1.0000 | MEDICATED_PATCH | TRANSDERMAL | Status: DC

## 2012-12-07 MED ORDER — NICOTINE 14 MG/24HR TD PT24: 1.0000 | MEDICATED_PATCH | TRANSDERMAL | Status: DC

## 2012-12-07 MED ORDER — METOPROLOL TARTRATE 25 MG PO TABS: 25.0000 mg | ORAL_TABLET | Freq: Two times a day (BID) | ORAL | Status: DC

## 2012-12-07 MED ORDER — NICOTINE 21 MG/24HR TD PT24: 1.0000 | MEDICATED_PATCH | TRANSDERMAL | Status: AC

## 2012-12-07 MED ORDER — ISOSORBIDE MONONITRATE ER 30 MG PO TB24: 30.0000 mg | ORAL_TABLET | Freq: Every day | ORAL | Status: DC

## 2012-12-07 NOTE — Discharge Summary (Signed)
See full note this am. cdm 

## 2012-12-07 NOTE — Progress Notes (Signed)
    SUBJECTIVE: No chest pain or SOB this am.   BP 123/77  Pulse 72  Temp(Src) 97.7 F (36.5 C) (Oral)  Resp 18  Ht 5\' 11"  (1.803 m)  Wt 260 lb (117.935 kg)  BMI 36.28 kg/m2  SpO2 99%  Intake/Output Summary (Last 24 hours) at 12/07/12 0710 Last data filed at 12/06/12 1700  Gross per 24 hour  Intake    920 ml  Output      0 ml  Net    920 ml    PHYSICAL EXAM General: Well developed, well nourished, in no acute distress. Alert and oriented x 3.  Psych:  Good affect, responds appropriately Neck: No JVD. No masses noted.  Lungs: Clear bilaterally with no wheezes or rhonci noted.  Heart: RRR with no murmurs noted. Abdomen: Bowel sounds are present. Soft, non-tender.  Extremities: No lower extremity edema.   LABS: Basic Metabolic Panel:  Recent Labs  84/69/62 0233 12/07/12 0542  NA 140 138  K 3.5 4.2  CL 105 106  CO2 26 21  GLUCOSE 159* 142*  BUN 8 8  CREATININE 1.19 0.82  CALCIUM 9.2 9.3   CBC:  Recent Labs  12/05/12 1805  12/06/12 0233 12/07/12 0542  WBC 7.4  --  7.9 6.7  NEUTROABS 4.3  --   --   --   HGB 15.1  < > 13.5 14.5  HCT 43.0  < > 39.5 42.1  MCV 91.7  --  92.5 92.9  PLT 269  --  241 232  < > = values in this interval not displayed. Cardiac Enzymes:  Recent Labs  12/05/12 2337 12/06/12 0543 12/06/12 1040  TROPONINI <0.30 <0.30 <0.30   Fasting Lipid Panel:  Recent Labs  12/06/12 0233  CHOL 146  HDL 34*  LDLCALC 55  TRIG 952*  CHOLHDL 4.3    Current Meds: . atorvastatin  80 mg Oral QPM  . clopidogrel  75 mg Oral Q breakfast  . fenofibrate  54 mg Oral Daily  . gabapentin  600 mg Oral TID  . losartan  100 mg Oral q morning - 10a  . nicotine  21 mg Transdermal Q1200  . ranolazine  500 mg Oral BID     ASSESSMENT AND PLAN:  1. CAD/chest pain: Admitted with chest pain. He is known to have severe CAD. Last cath 10/19/11. Cardiac markers negative. EKG without ischemic changes. Feeling well this am. No recurrent chest pain. Will  d/c home today. Continue home meds. He has f/u with me planned at the end of June. Will keep f/u appt.    Jet Armbrust  5/21/20147:10 AM

## 2012-12-07 NOTE — Discharge Summary (Signed)
Discharge Summary   Patient ID: [name],  MRN: [id number], DOB/AGE: [birth year] 37 y.o.  Admit date: 12/05/2012 Discharge date: 12/07/2012  Primary Physician: Majel Homer, MD Primary Cardiologist: C. Clifton James, MD  Discharge Diagnoses Principal Problem:   Angina pectoris  - Patient with known CAD s/p CABG in 2009 (IMA-LAD, occluded SVG-RI-OM2) and subsequent PCI-RCA  - Last cath 10/2011 with diffuse severe CAD, no flow-limiting lesions, medical management pursued  - Troponin-I WNL x 4, chest pain improved, plan continued medical management and follow-up next month with Dr. Clifton James  - Continue Lopressor, Ranexa, will add Imdur Active Problems:   HYPERLIPIDEMIA-MIXED   TOBACCO USE  - Continues to smoke 3 cigarettes/day for anxiety  - Nicotine patch taper added as a form of nicotine replacement therapy   CAD, AUTOLOGOUS BYPASS GRAFT   CAD in native artery   HTN (hypertension)   Obesity, Class II, BMI 35-39.9   Anxiety  - Driving tobacco abuse  - Follow-up PCP for ongoing management   Dizziness with falls  - With associated blurry vision and weakness  - Noncontrast head CT without acute findings  - Follow-up PCP for further evaluation/management   Hypokalemia  - Repleted   Allergies Allergies  Allergen Reactions  . Codeine Itching  . Celexa (Citalopram) Itching and Rash    Diagnostic Studies/Procedures  PA/LATERAL CHEST X-RAY - 12/05/12  IMPRESSION: Stable exam. No active disease.  NONCONTRAST HEAD CT - 12/06/12  IMPRESSION: No acute intracranial findings or mass lesion.  History of Present Illness   [name] is a 37 y.o. male who was admitted to [facility] on 12/05/12 with the above problem list.   He is extensive cardiac history including prior CABG in 2009 and subsequent PCI to RCA in the setting of MI in 2010. He also has history of hypertension, hyperlipidemia and ongoing tobacco abuse. He is daily episodes of angina which had been  stable for some time. Admission, he experienced a more severe episode of his baseline in terms of intensity described as "something sitting on my chest." He rested in bed all day however the pain did not improve. He notes chronic shortness of breath. He has an aspirin intolerance due to upset stomach and concern of GI bleeding. He also noted new onset of dizziness with falls, weakness in his arms and legs and blurred vision. He subsequently presented to [facility] ED.  There, EKG and initial troponin revealed no evidence of ischemia. Chest x-ray as above indicated no active disease. On exam, there was tenderness to palpation. The patient did endorse and panpositive ROS. The differential diagnosis included musculoskeletal etiologies, anxiety and low suspicion ACS. However, given his cardiac history he was admitted for formal rule out.  Hospital Course   He was started on heparin and nitroglycerin drips. Given his new endorsement of dizziness with falls, he underwent noncontrast head CT which as above revealed no acute finding. Three subsequent sets of troponin I returned within normal limits. His pain improved. He was evaluated by Dr. Clifton James the following day again been stable for transfer to telemetry. Heparin and nitroglycerin drips were discontinued as a trial to consider repeat cardiac catheterization if his chest discomfort recurred. Stress testing was deferred and considered to be unhelpful knowing his underlying CAD. He remained chest pain-free for an additional night. He was evaluated again by Dr. Clifton James today who deemed him stable for discharge. He will resume all prior outpatient medications and follow-up with Dr. Clifton James next month as previously  scheduled. He has been started on Nicoderm as a means of nicotine replacement therapy. He has also tried Chantix and e-cigarettes which have not helped. This will be tapered. He will be discharged on the medication regimen list below. This information  has been clearly outlined in the discharge AVS.   Of note, the patient still takes metoprolol tartrate (Lopressor) 25 mg BID. This was not documented in his chart nor did he receive Lopressor this admission. He reports taking this at home currently. Will continue this. He reports never being on Imdur in the past. He has difficult to control hypertension for which he has followed up with his PCP several times. Will start a trial of Imdur 30mg  PO daily to supplement his antianginal regimen. Tolerance of these meds and anginal symptoms can be reassessed on follow-up with Dr. Clifton James next month. If angina persists, additional options include titrating BB, Imdur, Ranexa or adding Norvasc.   Discharge Vitals:  Blood pressure 123/77, pulse 72, temperature 97.7 F (36.5 C), temperature source Oral, resp. rate 18, height 5\' 11"  (1.803 m), weight 260 lb (117.935 kg), SpO2 99.00%.   Labs: Recent Labs     12/06/12  0233  12/07/12  0542  WBC  7.9  6.7  HGB  13.5  14.5  HCT  39.5  42.1  MCV  92.5  92.9  PLT  241  232    Recent Labs Lab 12/05/12 1814 12/06/12 0233 12/07/12 0542  NA 141 140 138  K 3.8 3.5 4.2  CL 109 105 106  CO2  --  26 21  BUN 4* 8 8  CREATININE 1.00 1.19 0.82  CALCIUM  --  9.2 9.3  GLUCOSE 90 159* 142*   Recent Labs     12/05/12  2337  12/06/12  0543  12/06/12  1040  TROPONINI  <0.30  <0.30  <0.30   Recent Labs     12/06/12  0233  CHOL  146  HDL  34*  LDLCALC  55  TRIG  284*  CHOLHDL  4.3   Disposition:  Discharge Orders   Future Appointments Provider Department Dept Phone   01/12/2013 4:15 PM Kathleene Hazel, MD [facility] Main Office [place]) [phone]   Future Orders Complete By Expires     Diet - low sodium heart healthy  As directed     Increase activity slowly  As directed           Follow-up Information   Follow up with Verne Carrow, MD On 01/12/2013. (At 4:15 PM as previously scheduled. )    Contact information:    [address] [place] [address] [phone]       Follow up with Majel Homer, MD. (In 1 week for post-hospital follow-up. )    Contact information:   [address] [place] [address] [phone]       Discharge Medications:    Medication List    TAKE these medications       acidophilus Caps  Take 1 capsule by mouth daily.     ALPRAZolam 0.5 MG tablet  Commonly known as:  XANAX  Take 0.5 mg by mouth 3 (three) times daily as needed for anxiety.     atorvastatin 80 MG tablet  Commonly known as:  LIPITOR  Take 80 mg by mouth every evening.     clopidogrel 75 MG tablet  Commonly known as:  PLAVIX  Take 75 mg by mouth every morning.     cyclobenzaprine  10 MG tablet  Commonly known as:  FLEXERIL  Take 10 mg by mouth 3 (three) times daily as needed for muscle spasms.     fenofibrate 48 MG tablet  Commonly known as:  TRICOR  Take 48 mg by mouth every morning.     gabapentin 300 MG capsule  Commonly known as:  NEURONTIN  Take 2 capsules (600 mg total) by mouth 3 (three) times daily.     isosorbide mononitrate 30 MG 24 hr tablet  Commonly known as:  IMDUR  Take 1 tablet (30 mg total) by mouth daily.     losartan 100 MG tablet  Commonly known as:  COZAAR  Take 100 mg by mouth every morning.     metoprolol tartrate 25 MG tablet  Commonly known as:  LOPRESSOR  Take 1 tablet (25 mg total) by mouth 2 (two) times daily.     multivitamin with minerals Tabs  Take 1 tablet by mouth every morning.     nicotine 21 mg/24hr patch  Commonly known as:  NICODERM CQ - dosed in mg/24 hours  Place 1 patch onto the skin daily.     nicotine 14 mg/24hr patch  Commonly known as:  NICODERM CQ  Place 1 patch onto the skin daily.  Start taking on:  12/29/2012     nicotine 7 mg/24hr patch  Commonly known as:  NICODERM CQ  Place 1 patch onto the skin daily.  Start taking on:  01/27/2013     nitroGLYCERIN 0.4 MG SL tablet  Commonly known as:  NITROSTAT    Place 0.4 mg under the tongue every 5 (five) minutes as needed for chest pain.     OSTEO BI-FLEX TRIPLE STRENGTH PO  Take 1 tablet by mouth 3 (three) times daily.     ranolazine 500 MG 12 hr tablet  Commonly known as:  RANEXA  Take 1 tablet (500 mg total) by mouth 2 (two) times daily.       Outstanding Labs/Studies: None  Duration of Discharge Encounter: Greater than 30 minutes including physician time.  Signed, R. Hurman Horn, PA-C 12/07/2012, 9:45 AM

## 2012-12-08 ENCOUNTER — Telehealth: Payer: Self-pay | Admitting: Nurse Practitioner

## 2012-12-08 NOTE — Telephone Encounter (Signed)
LMTCB; TCM patient - needs 7-14 day f/u appointment

## 2012-12-09 NOTE — Telephone Encounter (Signed)
Follow Up     Pt calling back returning phone call. Please call.

## 2012-12-09 NOTE — Telephone Encounter (Signed)
LMTCB

## 2012-12-09 NOTE — Telephone Encounter (Signed)
Patient contacted regarding discharge from hospital on 12/07/12.  Patient understands to follow up with provider Dr. Clifton James on 01/12/13 at 4:15 at [facility]. Patient understands discharge instructions? Yes Patient understands medications and regiment? Yes Patient understands to bring all medications to this visit? Yes  Patient states that he is doing well with no complaints of chest pain or SOB. Patient states when Dr. Clifton James saw him in the hospital that he told him he could keep his regular scheduled appointment for 6/26 and to f/u with PCP w/in 1-2 weeks.  I advised patient to call us with any concerns prior to his June appointment.

## 2013-01-02 ENCOUNTER — Telehealth: Payer: Self-pay | Admitting: Family Medicine

## 2013-01-02 NOTE — Telephone Encounter (Signed)
Patient is calling because it doesn't look like the instructions for his new dose or the quantity of Gabapentin were corrected when last sent to the pharmacy.  His new dose is supposed to be Take 2 capsules (600 mg total) by mouth 3 (three) times daily, quantity 180, but he only got 90, and he didn't get any refills on it either.  He has enough to finish this week.

## 2013-01-02 NOTE — Telephone Encounter (Signed)
Will fwd to MD to review.  [name], Darlyne Russian, CMA

## 2013-01-03 MED ORDER — GABAPENTIN 300 MG PO CAPS: 600.0000 mg | ORAL_CAPSULE | Freq: Three times a day (TID) | ORAL | Status: DC

## 2013-01-03 NOTE — Telephone Encounter (Signed)
He probably asked for a refill on his old prescription, not his new one.  What I sent in at his last visit is 2 capsuls TID, #180.  I am sending in a refill on this now.  He should plan on coming in during the next 30 days so ewe can talk about whenther this dose is working for him.

## 2013-01-03 NOTE — Telephone Encounter (Signed)
Pt notified. [name].  Shane Badeaux, Darlyne Russian, CMA

## 2013-01-05 ENCOUNTER — Other Ambulatory Visit: Payer: Self-pay | Admitting: Family Medicine

## 2013-01-05 MED ORDER — GABAPENTIN 300 MG PO CAPS: 600.0000 mg | ORAL_CAPSULE | Freq: Three times a day (TID) | ORAL | Status: DC

## 2013-01-12 ENCOUNTER — Encounter: Payer: Self-pay | Admitting: Cardiovascular Disease

## 2013-01-12 ENCOUNTER — Ambulatory Visit (INDEPENDENT_AMBULATORY_CARE_PROVIDER_SITE_OTHER): Payer: Medicaid Other | Admitting: Cardiovascular Disease

## 2013-01-12 VITALS — BP 118/82 | HR 82 | Ht 70.0 in | Wt 275.0 lb

## 2013-01-12 DIAGNOSIS — I2510 Atherosclerotic heart disease of native coronary artery without angina pectoris: Secondary | ICD-10-CM

## 2013-01-12 DIAGNOSIS — F17200 Nicotine dependence, unspecified, uncomplicated: Secondary | ICD-10-CM

## 2013-01-12 DIAGNOSIS — Z720 Tobacco use: Secondary | ICD-10-CM

## 2013-01-12 MED ORDER — ALPRAZOLAM 0.5 MG PO TABS: 0.5000 mg | ORAL_TABLET | Freq: Three times a day (TID) | ORAL | Status: DC | PRN

## 2013-01-12 NOTE — Patient Instructions (Signed)
Your physician wants you to follow-up in:  6 months. You will receive a reminder letter in the mail two months in advance. If you don't receive a letter, please call our office to schedule the follow-up appointment.   

## 2013-01-12 NOTE — Progress Notes (Signed)
History of Present Illness: 37 year old gentleman with history of CAD with serial PCI and a 4-vessel CABG in 2009, HTN, HLD, tobacco abuse who is here today for cardiac follow up. He was admitted to [facility] Emergency Department with chest pain on 10/19/11 and transferred to [facility] for cath per Dr. Riley Kill. Cardiac cath on 10/19/11 with stable CAD, bypasses. He was discharged home on Imdur. His coronary history began in 2009 with an inferior MI requiring RCA PCI and subsequent CABG. He then presented in 2010 with an NSTEMI in 2010 requiring LCx PCI. He underwent repeat coronary angiography in September of 2011 and was found to have stable native and bypass graft anatomy not requiring any intervention at the time. As above, last cath in April 2013 with stable disease. Started on Ranexa in April 2013. ( in assistance program). Admitted to [facility] May 19-21, 2014 with chest pain. Ruled out for MI with serial cardiac enzymes. Discharged home chest pain free. He has chronic back pain.   He is here today for cardiac follow up. He has been doing well. He has had no severe chest pains. He has chronic stable angina. He is walking 1.5 miles in the mornings and having no pains. Breathing is at baseline. He is smoking 4 cigarettes per day. Still have periods of anxiety.   Primary Care Physician: Ritch  Last Lipid Profile:Lipid Panel     Component Value Date/Time   CHOL 146 12/06/2012 0233   TRIG 284* 12/06/2012 0233   HDL 34* 12/06/2012 0233   CHOLHDL 4.3 12/06/2012 0233   VLDL 57* 12/06/2012 0233   LDLCALC 55 12/06/2012 0233     Past Medical History  Diagnosis Date  . Hypertension   . MI (myocardial infarction)   . Hx of CABG   . Heart attack   . Bronchitis   . Tobacco user   . Hyperlipidemia   . MYOCARDIAL INFARCTION 10/13/2008    Qualifier: Diagnosis of  By: Juanda Chance, MD, Johny Chess     Past Surgical History  Procedure Laterality Date  . Coronary artery bypass graft      status post  diaphragmatic wall infarction Rx BMS RCA 03-11-08   . Orthopedic surgery    . Coronary stent placement      Current Outpatient Prescriptions  Medication Sig Dispense Refill  . acidophilus (RISAQUAD) CAPS Take 1 capsule by mouth daily.      Marland Kitchen ALPRAZolam (XANAX) 0.5 MG tablet Take 0.5 mg by mouth 3 (three) times daily as needed for anxiety.      Marland Kitchen atorvastatin (LIPITOR) 80 MG tablet Take 80 mg by mouth every evening.      . clopidogrel (PLAVIX) 75 MG tablet Take 75 mg by mouth every morning.      . cyclobenzaprine (FLEXERIL) 10 MG tablet Take 10 mg by mouth 3 (three) times daily as needed for muscle spasms.      . fenofibrate (TRICOR) 48 MG tablet Take 48 mg by mouth every morning.      . gabapentin (NEURONTIN) 300 MG capsule Take 2 capsules (600 mg total) by mouth 3 (three) times daily.  180 capsule  0  . isosorbide mononitrate (IMDUR) 30 MG 24 hr tablet Take 1 tablet (30 mg total) by mouth daily.  30 tablet  3  . losartan (COZAAR) 100 MG tablet Take 100 mg by mouth every morning.      . metoprolol tartrate (LOPRESSOR) 25 MG tablet Take 1 tablet (25 mg total) by  mouth 2 (two) times daily.  60 tablet  3  . Misc Natural Products (OSTEO BI-FLEX TRIPLE STRENGTH PO) Take 1 tablet by mouth 3 (three) times daily.       . Multiple Vitamin (MULITIVITAMIN WITH MINERALS) TABS Take 1 tablet by mouth every morning.       . nitroGLYCERIN (NITROSTAT) 0.4 MG SL tablet Place 0.4 mg under the tongue every 5 (five) minutes as needed for chest pain.      . ranolazine (RANEXA) 500 MG 12 hr tablet Take 1 tablet (500 mg total) by mouth 2 (two) times daily.  180 tablet  3   No current facility-administered medications for this visit.    Allergies  Allergen Reactions  . Codeine Itching  . Celexa (Citalopram) Itching and Rash    History   Social History  . Marital Status: Divorced    Spouse Name: N/A    Number of Children: N/A  . Years of Education: N/A   Occupational History  . Not on file.   Social  History Main Topics  . Smoking status: Light Tobacco Smoker -- 0.20 packs/day for 20 years    Types: Cigarettes  . Smokeless tobacco: Never Used  . Alcohol Use: No     Comment: maybe two beers per month  . Drug Use: No     Comment: maybe once a month  . Sexually Active: Not on file   Other Topics Concern  . Not on file   Social History Narrative  . No narrative on file    Family History  Problem Relation Age of Onset  . Cancer Mother   . Cancer Sister   . Diabetes Mother   . Diabetes Sister   . Hyperlipidemia Mother   . Hypertension Mother   . Hyperlipidemia Sister   . Hypertension Sister   . Early death Mother   . Early death Sister     Review of Systems:  As stated in the HPI and otherwise negative.   BP 118/82  Pulse 82  Ht 5\' 10"  (1.778 m)  Wt 275 lb (124.739 kg)  BMI 39.46 kg/m2  Physical Examination: General: Well developed, well nourished, NAD HEENT: OP clear, mucus membranes moist SKIN: warm, dry. No rashes. Neuro: No focal deficits Musculoskeletal: Muscle strength 5/5 all ext Psychiatric: Mood and affect normal Neck: No JVD, no carotid bruits, no thyromegaly, no lymphadenopathy. Lungs:Clear bilaterally, no wheezes, rhonci, crackles Cardiovascular: Regular rate and rhythm. No murmurs, gallops or rubs. Abdomen:Soft. Bowel sounds present. Non-tender.  Extremities: No lower extremity edema. Pulses are 2 + in the bilateral DP/PT.  Assessment and Plan:   1. CAD: Stable. Cath April 2013 with stable severe disease s/p CABG. He is doing well on Ranexa and Imdur. Will continue ASA, statin, beta blocker, ARB.   2. HYPERLIPIDEMIA: Controlled. Continue statin and Tricor.   3. TOBACCO USE: He wishes to stop. I have spent 10 minutes today discussing the risk of ongoing abuse.   4. HTN: BP well controlled.   5. Anxiety: Xanax refilled for one time only, 60 pills. Will seek assistance for this in primary care so there will be one centralized place writing this.

## 2013-01-19 ENCOUNTER — Telehealth: Payer: Self-pay | Admitting: *Deleted

## 2013-01-19 ENCOUNTER — Encounter: Payer: Self-pay | Admitting: Family Medicine

## 2013-01-19 ENCOUNTER — Ambulatory Visit (INDEPENDENT_AMBULATORY_CARE_PROVIDER_SITE_OTHER): Payer: Medicaid Other | Admitting: Family Medicine

## 2013-01-19 VITALS — BP 125/85 | HR 84 | Ht 71.0 in | Wt 271.0 lb

## 2013-01-19 DIAGNOSIS — M5431 Sciatica, right side: Secondary | ICD-10-CM

## 2013-01-19 DIAGNOSIS — K029 Dental caries, unspecified: Secondary | ICD-10-CM

## 2013-01-19 DIAGNOSIS — F419 Anxiety disorder, unspecified: Secondary | ICD-10-CM

## 2013-01-19 DIAGNOSIS — M5430 Sciatica, unspecified side: Secondary | ICD-10-CM

## 2013-01-19 DIAGNOSIS — I10 Essential (primary) hypertension: Secondary | ICD-10-CM

## 2013-01-19 DIAGNOSIS — F411 Generalized anxiety disorder: Secondary | ICD-10-CM

## 2013-01-19 DIAGNOSIS — M519 Unspecified thoracic, thoracolumbar and lumbosacral intervertebral disc disorder: Secondary | ICD-10-CM

## 2013-01-19 DIAGNOSIS — E785 Hyperlipidemia, unspecified: Secondary | ICD-10-CM

## 2013-01-19 DIAGNOSIS — I209 Angina pectoris, unspecified: Secondary | ICD-10-CM

## 2013-01-19 DIAGNOSIS — I2510 Atherosclerotic heart disease of native coronary artery without angina pectoris: Secondary | ICD-10-CM

## 2013-01-19 MED ORDER — AMOXICILLIN 500 MG PO CAPS: 500.0000 mg | ORAL_CAPSULE | Freq: Two times a day (BID) | ORAL | Status: DC

## 2013-01-19 MED ORDER — TRAMADOL HCL 50 MG PO TABS: 50.0000 mg | ORAL_TABLET | Freq: Three times a day (TID) | ORAL | Status: DC | PRN

## 2013-01-19 MED ORDER — CYCLOBENZAPRINE HCL 10 MG PO TABS: 10.0000 mg | ORAL_TABLET | Freq: Three times a day (TID) | ORAL | Status: DC | PRN

## 2013-01-19 MED ORDER — GABAPENTIN 300 MG PO CAPS: 600.0000 mg | ORAL_CAPSULE | Freq: Three times a day (TID) | ORAL | Status: DC

## 2013-01-19 NOTE — Telephone Encounter (Signed)
[name], at patient's dentist office called.  Wondering if patient needs to stop his plavix before his dental app on 01/24/13.  Dr. Casper Harrison attempted to call dentist office back at 4:50pm and phone already turned off.  Will fwd msg to Md.  Khrystal Jeanmarie, Darlyne Russian, CMA

## 2013-01-19 NOTE — Patient Instructions (Signed)
Thank you for coming in, today! It was nice to meet you. For your back pain:  I will refill your tramadol, Flexeril, and gabapentin.   Exercise is good. Use pain as a guide--if something hurts, stop or cut back, then gradually build up.  Keep the good work with weight loss. Keep up walking daily. For your heart disease:  I'm not changing any medications for your blood pressure or your heart today.  Keep your appointments with Dr. Sanjuana Kava. Your blood pressure today was 125/85. Very good!  If he needs to or wants to change any of the drugs he's prescribing, I will leave that up to him. For your anxiety:  Xanax is reasonable to use for now.  If you start needing it very often, we can try a different medication or two. For your dental appointment, I will prescribe amoxicillin. I will fax your form to them. Come back to see me in about 2-3 months, or earlier if you need. Please feel free to call with any questions or concerns at any time, at [phone]. --Dr. Casper Harrison

## 2013-01-23 ENCOUNTER — Telehealth: Payer: Self-pay | Admitting: Family Medicine

## 2013-01-23 NOTE — Assessment & Plan Note (Signed)
Stable on Ranexa and Imdur, per cardiology. Continue, with nitro PRN.

## 2013-01-23 NOTE — Assessment & Plan Note (Signed)
Good relief with gabapentin. Continue, refilled today.

## 2013-01-23 NOTE — Assessment & Plan Note (Signed)
Compliant with current meds, Lipitor 80 and Tricor 48 mg daily. No new side effects. Continue Tricor and Lipitor.

## 2013-01-23 NOTE — Telephone Encounter (Signed)
[name], at patient's dentist office called. Wondering if patient needs to stop his plavix before his dental app on 01/24/13. JW

## 2013-01-23 NOTE — Assessment & Plan Note (Signed)
Filled out dentist's office form, and started on amoxicillin 500 mg BID for prophylaxis. Pt to follow up with dentist as scheduled and continue/stop abx per their recommendations.

## 2013-01-23 NOTE — Assessment & Plan Note (Signed)
A: Long-standing. Previously taken off of Xanax by Dr. Louanne Belton and on citalopram and Paxil in the past, but neither well-tolerated. Recently restarted on Xanax by Dr. Sanjuana Kava. Currently reasonably controlled with "occasional" Xanax.  P: Continue Xanax for now. Discussed with pt that this is reasonable as long as he is not frequently using Xanax (i.e., daily) and/or running out early, etc. If stable on reasonable dose of Xanax, will consider change to longer-acting benzo such as Klonopin.

## 2013-01-23 NOTE — Assessment & Plan Note (Signed)
A: Well-controlled with sciatica with Tramadol, Flexeril, and gabapentin. Will avoid narcotics, as this has been discussed with him in the past by Dr. Louanne Belton, extensively.  P: Refilled medications. Follow-up PRN. Can consider physical therapy in the future, but pt has Medicaid and would have to pay out of pocket.

## 2013-01-23 NOTE — Assessment & Plan Note (Signed)
Well-controlled today on multiple medications. Continue Lopressor 25 BID, Cozaar 100 daily. Ranexa and Imdur not strictly for HTN, but possibly contributing.

## 2013-01-23 NOTE — Telephone Encounter (Signed)
Pt should hold Plavix today and tomorrow, then 1-2 days after procedure. Thanks. --CMS

## 2013-01-23 NOTE — Progress Notes (Signed)
  Subjective:    Patient ID: [name], male    DOB: 12-12-1975, 37 y.o.   MRN: [id number]  HPI: Pt presents to clinic requesting medication refill and f/u on chest pain/HTN/HLD, chronic back pain, and anxiety, as well as questions about an upcoming dental visit.  Chest pain: pt has an extensive CAD hx, PCI and 4-vessel CABG starting in 2009; inferior MI in 2009, NSTEMI in 2010 -last cath april 2013 during admission for chest pain -last cardiology follow-up last month, continued on meds, reports compliance (see anxiety below) -reports compliance with Ranexa, Imdur, metoprolol, Cozaar, Lipitor, ASA -no current chest pain, better with anxiety controlled -currently off of cigarettes, though does occasionally use snuff -working on diet/exercise, "doing better"  Chronic back pain: low-back with some right sciatica-type symptoms, at baseline; has seen ortho in the past but is not an operative candidate due to cardiac hx -much improved when gabapentin started not long ago by Dr. Louanne Belton; also better with more walking/exercise and weight loss, of which pt is proud -requests refill on Tramadol, Flexeril, gabapentin  Anxiety: recently (re)started on Xanax by Dr. Sanjuana Kava of [facility] -uses Xanax "infrequently," which helps greatly; typically only uses 1 per day when needed, and not every day -strong component of chest pain, particularly when he "gets worked up" -has been on Celexa in the past, which he did not tolerate well  HTN and HLD: stable, on meds as above. No new side effects.  Dental visit: Pt reports he has a visit on 7/8 for a deep tooth cleaning; pt had "half" of his procedure -at first visit, dentist had no problems, but "looked at my history and said, wait, we have to talk to your regular doctor" -brings form from dentist's office today indicating request for information on history, instructions on whether to hold blood thinners, and to start prophylactic abx  Pt is a  former smoker, as above. In addition to the above documentation, pt's PMH, surgical history, FH, and SH all reviewed and updated where appropriate in the EMR. I have also reviewed and updated the pt's allergies and current medications as appropriate.  Review of Systems: As above. Otherwise, full 12-system ROS was reviewed and all negative.     Objective:   Physical Exam BP 125/85  Pulse 84  Ht 5\' 11"  (1.803 m)  Wt 271 lb (122.925 kg)  BMI 37.81 kg/m2 Gen: well-appearing adult male in NAD HEENT: [place]/AT, sclerae/conjunctivae clear, no lid lag, EOMI, PERRLA   MMM, posterior oropharynx clear, no cervical lymphadenopathy  neck supple with full ROM, no masses appreciated; thyroid not enlarged  Cardio: RRR, no murmur appreciated; distal pulses intact/symmetric Pulm: CTAB, no wheezes, normal WOB  Abd: soft, nondistended, BS+, no HSM Ext: warm/well-perfused, no cyanosis/clubbing/edema MSK: strength 5/5 in all four extremities, no frank joint deformity/effusion;   normal ROM to all four extremities  Lumbar spine with diffuse point tenderness; straight-leg lift test with radicular pain on the right, at baseline per pt Neuro/Psych: alert/oriented, sensation grossly intact; normal gait/balance  mood slightly anxious with congruent affect, but generally not distressed; thought process linear, goal-oriented     Assessment & Plan:

## 2013-01-23 NOTE — Assessment & Plan Note (Signed)
Recently seen by Dr. Sanjuana Kava. No current chest pain. Continue current medications (statin, ASA, Imdur, Ranexa, Cozaar, metoprolol) F/u with cardiology as scheduled/PRN.

## 2013-02-03 ENCOUNTER — Telehealth: Payer: Self-pay | Admitting: Family Medicine

## 2013-02-03 NOTE — Telephone Encounter (Signed)
Alternate phone number where he can be reached belongs to his fiance [phone] - Patient is calling requesting a referral to the [facility] for rehabilitation.

## 2013-02-03 NOTE — Telephone Encounter (Signed)
Will fwd to MD.  [name] L, CMA  

## 2013-02-03 NOTE — Telephone Encounter (Signed)
I'm not sure what he's requesting. Rehab for substance use, physical rehab, something else? Please call pt to verify exactly what he needs, and once we know that, I'll check with Lupita Leash to see what orders need to be placed. Thanks! --CMS

## 2013-02-06 ENCOUNTER — Telehealth: Payer: Self-pay | Admitting: Family Medicine

## 2013-02-06 DIAGNOSIS — M5431 Sciatica, right side: Secondary | ICD-10-CM

## 2013-02-06 DIAGNOSIS — M519 Unspecified thoracic, thoracolumbar and lumbosacral intervertebral disc disorder: Secondary | ICD-10-CM

## 2013-02-06 NOTE — Telephone Encounter (Signed)
Order placed for ambulatory referral to physical therapy. If this can be done at the [facility] this is fine, per pt preference (?), otherwise, please call pt to see if [facility] is feasible. Please let me know if any different specific orders need to be placed. Thanks! --CMS

## 2013-02-06 NOTE — Telephone Encounter (Signed)
Pt returned call stating that it is for physical rehab. JW

## 2013-02-06 NOTE — Telephone Encounter (Signed)
[name] returning patients call.  Luisfelipe Engelstad, Darlyne Russian, CMA

## 2013-02-06 NOTE — Telephone Encounter (Signed)
Will forward to Dr Street 

## 2013-02-06 NOTE — Telephone Encounter (Signed)
Medicaid will not cover physical therapy for pain control.  If patient wants PT he will have to pay for it.  I am not aware that the [facility] offers PT.  Ileana Ladd

## 2013-02-07 ENCOUNTER — Telehealth: Payer: Self-pay | Admitting: Family Medicine

## 2013-02-07 ENCOUNTER — Encounter: Payer: Self-pay | Admitting: Home Health Services

## 2013-02-07 NOTE — Telephone Encounter (Signed)
Spoke with patient and he will schedule an appt to discuss PT.  Patient states all he needs is a Physicist, medical for the [facility].  Pt would also like to see the MD, so appt scheduled for 02/16/13 with Dr. Casper Harrison.  Courteny Egler, Darlyne Russian, CMA

## 2013-02-07 NOTE — Telephone Encounter (Signed)
Please call pt to inform him that Medicaid will not cover physical therapy for pain control. If he would like, he can make an appointment to come to clinic to discuss pain/physical therapy, if he would like to pay for it himself, etc. Thanks/sorry. --CMS

## 2013-02-07 NOTE — Progress Notes (Signed)
Rosezena Sensor Sep 06, 1975= Working with [place] [place], [place], RN [phone]). PCM connected patient with no cost transportation services for medical appointments, assisting patient with locating new housing over the next two months and working with patient on making and keeping all medical appointments.   Jacquelyne Balint, BSW

## 2013-02-07 NOTE — Telephone Encounter (Signed)
LMOM advising pt of Dr Donnetta Simpers msg and to sched and OV.

## 2013-02-08 NOTE — Telephone Encounter (Signed)
error 

## 2013-02-10 ENCOUNTER — Telehealth: Payer: Self-pay | Admitting: Family Medicine

## 2013-02-10 NOTE — Telephone Encounter (Signed)
Pt is requesting refills on Clopidogrel 75 mg, and Fenofibrate 48 mg be sent to the pharmacy [facility] on [address]. JW

## 2013-02-10 NOTE — Telephone Encounter (Signed)
Pt instructed to call his pharmacy for med refill request.  Pt verbalized understanding.  Keigan Tafoya, Darlyne Russian, CMA

## 2013-02-16 ENCOUNTER — Ambulatory Visit: Payer: Medicaid Other | Admitting: Family Medicine

## 2013-02-21 ENCOUNTER — Ambulatory Visit (INDEPENDENT_AMBULATORY_CARE_PROVIDER_SITE_OTHER): Payer: Medicaid Other | Admitting: Family Medicine

## 2013-02-21 VITALS — BP 146/96 | HR 76 | Ht 71.0 in | Wt 282.0 lb

## 2013-02-21 DIAGNOSIS — M519 Unspecified thoracic, thoracolumbar and lumbosacral intervertebral disc disorder: Secondary | ICD-10-CM

## 2013-02-21 DIAGNOSIS — R609 Edema, unspecified: Secondary | ICD-10-CM

## 2013-02-21 DIAGNOSIS — R600 Localized edema: Secondary | ICD-10-CM | POA: Insufficient documentation

## 2013-02-21 MED ORDER — FUROSEMIDE 20 MG PO TABS: 20.0000 mg | ORAL_TABLET | Freq: Every day | ORAL | Status: DC

## 2013-02-21 NOTE — Assessment & Plan Note (Signed)
A: New onset, present for about 2 days. Uncertain etiology, if related to extensive heart disease or from overexertion at the beach (increased activity level, though his walking at the beach does not sound overly strenuous or greatly outside his baseline activity). Otherwise does not appear in any distress; no SOB or objective lung findings or other abnormalities on exam.  P: Rx for Lasix 20 mg tablets, #30. Instructed to take one tablet for 3-4 days to see if swelling resolves. If no resolution in 3-4 days instructed to take 40 mg daily for another 3-4 days, then f/u with me. Also advised close f/u with cardiology in case Dr. Sanjuana Kava would like further work-up or to adjust his BP/other medications. Advised pt to weigh himself daily and to follow up sooner if he has >3lb gain in one day or >5lb over three days. Pt voiced understanding.

## 2013-02-21 NOTE — Assessment & Plan Note (Signed)
A: Doing well, on Tramadol, gabapentin, and Flexeril. Unable to pay out of pocket for PT, and Medicaid does not cover PT for pain control.  P: Letter given to take to the [facility] for their rehab program. Advised to f/u as needed and to call if any further information from our office would be helpful to get [facility]. Otherwise continue current medications.

## 2013-02-21 NOTE — Progress Notes (Signed)
  Subjective:    Patient ID: [name], male    DOB: Sep 24, 1975, 37 y.o.   MRN: [id number]  HPI: Pt presents to clinic for f/u of chronic back pain, requesting letter to the [facility], as well as bilateral foot/leg swelling present for about 2 days.  Chronic back pain - reports compliance with current meds, no real change in symptoms; decent relief with gabapentin, Tramadol, Flexeril  -generally low-back, with right-sided sciatica/radicular pain  -requests letter to [facility] for rehab program; they have a "walk-in" physical rehab program that requires application  Foot/leg swelling - present for about 2 days, since returning from a beach trip (at the beach for 4 days)  -context of extensive CAD s/p CABG and stenting, but no chest pain, SOB, cough, PND, or other new symptoms  -states he was only out on the beach for about half a day due to rain, walked outside that day on asphalt and sand, total about 2 miles  -never has had swelling like this before, no change in urinary habits (increase or decrease)  -no recent change in medications, diet, or activity level other than beach trip  -endorses some "numbness"/tingling but does have sensation in his feet; states current symptoms are similar to chronic neuropathic pain/sensation  -followed by Dr. Sanjuana Kava for cardiology, last saw him about one month ago, without changes in medications  Review of Systems: as above; no new weakness in LE, no change in balance, difficulty walking, or frank pain in bilateral feet/ankles.     Objective:   Physical Exam BP 146/96  Pulse 76  Ht 5\' 11"  (1.803 m)  Wt 282 lb (127.914 kg)  BMI 39.35 kg/m2 Gen: well-appearing adult male in NAD, very pleasant Cardio: RRR, no murmur appreciated, LE swelling bilaterally Neck: supple, normal ROM, no appreciable JVD Pulm: CTAB, no wheezes, normal WOB Abd: soft, nontender, nondistended, no evidence for frank ascites Neuro: strength 5/5 in bilateral upper and lower  extremities, symmetric bilaterally, gait slightly antalgic with right limp, at baseline Ext: LE bilaterally with gross swelling to knee, symmetric bilaterally, but pitting is only 1+ from ankles to midshins  No tenderness of LE, no redness or swelling worse on one side vs the other  Pt stated "numbness" as above in HPI, but sensation intact over bilateral LE from knee down to toes     Assessment & Plan:

## 2013-02-21 NOTE — Patient Instructions (Addendum)
Thank you for coming in, today! I will write you a letter for the [facility] rehab program. For your leg swelling:  I'm not sure what this is from. It could be from walking a lot at the beach. It could be related to your heart.  Your lungs sound clear and your heart doesn't sound any different on my exam.  I want you to call Dr. Weldon Picking office and see if you can see him in the next few weeks, as well. I am writing you a prescription for a medication called Lasix (furosemide).  This medication will make you urinate more and should help get rid of extra fluid in your legs.  Take one pill once a day for the next three or four days.  If this takes care of your swelling, you don't need to keep taking the medicine.  If it helps but doesn't get rid of all the swelling, you can take 2 pills at a time, once a day, for three or four more days. Weigh yourself every day. If you gain more than 3 pounds in one day, or more than 5 pounds in 3 days, call the office or come back for a visit. Make an appointment to come back to see me in about 2 weeks and we'll see how you're doing. If you have any chest pain, shortness of breath, difficulty breathing, or worse swelling, call the office or go to the emergency room. Please feel free to call with any questions or concerns at any time, at [phone]. --Dr. Casper Harrison

## 2013-02-27 ENCOUNTER — Other Ambulatory Visit: Payer: Self-pay | Admitting: Cardiovascular Disease

## 2013-02-27 NOTE — Telephone Encounter (Signed)
Per note from last office visit with Dr. Clifton James further refills for Xanax should be through primary care. I spoke with [facility] pharmacy and asked them to contact Dr. Casper Harrison regarding refill.

## 2013-02-28 ENCOUNTER — Other Ambulatory Visit: Payer: Self-pay | Admitting: *Deleted

## 2013-02-28 DIAGNOSIS — I2510 Atherosclerotic heart disease of native coronary artery without angina pectoris: Secondary | ICD-10-CM

## 2013-02-28 DIAGNOSIS — F419 Anxiety disorder, unspecified: Secondary | ICD-10-CM

## 2013-02-28 MED ORDER — ALPRAZOLAM 0.5 MG PO TABS: 0.5000 mg | ORAL_TABLET | Freq: Three times a day (TID) | ORAL | Status: DC | PRN

## 2013-02-28 NOTE — Telephone Encounter (Signed)
Pt called and notified to pick up Rx.  Forrestine Lecrone, Darlyne Russian, CMA

## 2013-02-28 NOTE — Telephone Encounter (Signed)
Please call pt to tell him that Rx for Xanax must be printed and picked up. Left at front desk for pt to pick up at his convenience. Thanks! --CMS

## 2013-03-22 ENCOUNTER — Encounter: Payer: Self-pay | Admitting: Family Medicine

## 2013-03-22 ENCOUNTER — Ambulatory Visit (INDEPENDENT_AMBULATORY_CARE_PROVIDER_SITE_OTHER): Payer: Medicaid Other | Admitting: Family Medicine

## 2013-03-22 VITALS — BP 128/90 | HR 74 | Temp 98.1°F | Ht 71.0 in | Wt 281.0 lb

## 2013-03-22 DIAGNOSIS — M519 Unspecified thoracic, thoracolumbar and lumbosacral intervertebral disc disorder: Secondary | ICD-10-CM

## 2013-03-22 DIAGNOSIS — F411 Generalized anxiety disorder: Secondary | ICD-10-CM

## 2013-03-22 DIAGNOSIS — M5430 Sciatica, unspecified side: Secondary | ICD-10-CM

## 2013-03-22 DIAGNOSIS — F419 Anxiety disorder, unspecified: Secondary | ICD-10-CM

## 2013-03-22 DIAGNOSIS — M5431 Sciatica, right side: Secondary | ICD-10-CM

## 2013-03-22 MED ORDER — OXYCODONE-ACETAMINOPHEN 10-325 MG PO TABS: 1.0000 | ORAL_TABLET | Freq: Three times a day (TID) | ORAL | Status: DC | PRN

## 2013-03-22 NOTE — Progress Notes (Signed)
  Subjective:    Patient ID: [name], male    DOB: 1976-03-23, 37 y.o.   MRN: [id number]  HPI: Pt presents to clinic for acutely worse back pain for the last few weeks. -pain is sharp, aching, worse with moving, walking/standing, unrelieved by positioning in bed, and interferes with sleep -unrelieved with tramadol, Flexeril, and gabapentin at home (previously regimen had worked very well); last took meds 2 days ago -pt states he hasn't taken anything in the past two days due to no difference with or without them, and frustration -unable to get into [facility] rehab/PT program due to cost -also describes worse burning/numbness pain in his hands and feet and describes occasional episodes of weakness in his hands where he "drops things without meaning to" -reports again that he has been seen by several surgeons (at least 3) who have been unwilling to operate due to his heart history (MI, stents, etc)  Review of Systems: As above. Denies fever/chills, N/V, loss of bowel or bladder control, saddle numbness.  Sciatica-type symptoms worse on the right (see exam).  Anxiety worse over the past several days due to pain as above, though not requiring PRN Xanax.     Objective:   Physical Exam BP 128/90  Pulse 74  Temp(Src) 98.1 F (36.7 C) (Oral)  Ht 5\' 11"  (1.803 m)  Wt 281 lb (127.461 kg)  BMI 39.21 kg/m2  Gen: adult male, appears very uncomfortable with walking/standing/moving, occasionally anxious but very reasonable and appreciative HEENT: PERRLA, EOMI, [place]/AT, MMM  Neck ROM reduced in all planes secondary to increased pain with movement; some muscle tightness in neck and down into shoulders MSK: point tenderness at various levels to spine, worse in midback and low-lumbar spine; worst around T10-L1, to even light palpation  Lighting-shock radicular pain from right hip to right outer ankle with sitting straight leg-lift  No true radicular symptoms on left; muscle tightness pain in hip/low  back on both sides with leg-lift  Strength 5/5 in both upper and lower extremities, sensation grossly intact  Definite slowed antalgic gait, though pt able to move without assistance     Assessment & Plan:  Greater than 30 minutes was spent in face-to-face time with this patient, more than half of which was devoted to counseling and development/coordination of care and treatment planning. See progress notes.

## 2013-03-22 NOTE — Assessment & Plan Note (Signed)
Related to multi-level degenerative disk disease, currently acutely worse. Continue gabapentin. See also other problem list note from this visit.

## 2013-03-22 NOTE — Assessment & Plan Note (Signed)
Mostly exacerbated by pain, though pt seems to be coping well without overuse of Xanax PRN. Continue medication as needed, no refill requested or needed today. Depending on if/when pt gets into pain clinic and responds to their management, I will be more than willing to continue prescribing benzo unless pain clinic requires all controlled substances to come from them, as pt has been very reasonable with his requests and use of medication (two Rx's, total #120, enough for 40 days TID use, has lasted since June and pt has "a full bottle" at home). Instructed pt to discuss this with pain clinic when he sees them.

## 2013-03-22 NOTE — Assessment & Plan Note (Addendum)
Greater than 30 minutes was spent in face-to-face time with this patient, more than half of which was devoted to counseling and development/coordination of care and treatment planning. A: Severe in nature, worse acutely for a few weeks, not controlled with previous regimen of tramadol, gabapentin, and Flexeril. Unable to afford PT or rehab at the [facility]. Has seen several surgeons in the past unwilling to operate due to pt's extensive heart disease at a young age. Of note, pt states, "I'll sign any release you or a surgeon wants me to, I just want somebody to help."  P: Extensively discussed various options for pain control, including chronic medication, exercise/weight loss, pain clinic and/or surgery referral. At this point, I think pain clinic is the best next step. Rx given for one month's worth of Percocet 10-325 TID PRN (#90, no refills), with the expectation that this should last until pt can be seen in pain clinic, to which he is referred today. Depending on how/when pt is able to be seen and their management, may need to consider re-referral to orthopedics/neurosurgery (perhaps providers pt has not previously seen) to see if he definitely is or is not a surgical candidate. Did advise reducing exercise activity (sit-ups, etc) in the short term, but encouraged movement as opposed to bed rest. Will follow up in the next few weeks.

## 2013-03-22 NOTE — Patient Instructions (Signed)
Thank you for coming in, today! I think it's reasonable for you to be seen in a pain clinic. They specialize in managing pain, which might be the best option since the surgeons haven't wanted to operate. I will prescribe enough Percocet to take up to three times a day for 1 month. If this needs to be a chronic medication, the pain clinic will need to be in charge of that prescription. If you start having fevers, chills, nausea, vomiting, loss of your bowels or bladder, or new symptoms, call or come back. Please feel free to call with any questions or concerns at any time, at [phone]. --Dr. Casper Harrison  Sciatica Sciatica is pain, weakness, numbness, or tingling along your sciatic nerve. The nerve starts in the lower back and runs down the back of each leg. Nerve damage or certain conditions pinch or put pressure on the sciatic nerve. This causes the pain, weakness, and other discomforts of sciatica. HOME CARE   Only take medicine as told by your doctor.  Apply ice to the affected area for 20 minutes. Do this 3 4 times a day for the first 48 72 hours. Then try heat in the same way.  Exercise, stretch, or do your usual activities if these do not make your pain worse.  Go to physical therapy as told by your doctor.  Keep all doctor visits as told.  Do not wear high heels or shoes that are not supportive.  Get a firm mattress if your mattress is too soft to lessen pain and discomfort. GET HELP RIGHT AWAY IF:   You cannot control when you poop (bowel movement) or pee (urinate).  You have more weakness in your lower back, lower belly (pelvis), butt (buttocks), or legs.  You have redness or puffiness (swelling) of your back.  You have a burning feeling when you pee.  You have pain that gets worse when you lie down.  You have pain that wakes you from your sleep.  Your pain is worse than past pain.  Your pain lasts longer than 4 weeks.  You are suddenly losing weight without  reason. MAKE SURE YOU:   Understand these instructions.  Will watch this condition.  Will get help right away if you are not doing well or get worse. Document Released: 04/14/2008 Document Revised: 01/05/2012 Document Reviewed: 11/15/2011 [facility] Patient Information 2014 [place], [place].

## 2013-04-04 ENCOUNTER — Encounter: Payer: Self-pay | Admitting: Physical Medicine & Rehabilitation

## 2013-04-09 ENCOUNTER — Other Ambulatory Visit: Payer: Self-pay | Admitting: Family Medicine

## 2013-04-28 ENCOUNTER — Ambulatory Visit: Payer: Medicaid Other | Admitting: Physical Medicine & Rehabilitation

## 2013-05-03 ENCOUNTER — Encounter: Payer: Self-pay | Admitting: Family Medicine

## 2013-05-03 ENCOUNTER — Ambulatory Visit (INDEPENDENT_AMBULATORY_CARE_PROVIDER_SITE_OTHER): Payer: Medicaid Other | Admitting: Family Medicine

## 2013-05-03 VITALS — BP 135/88 | HR 79 | Ht 71.0 in | Wt 279.0 lb

## 2013-05-03 DIAGNOSIS — I10 Essential (primary) hypertension: Secondary | ICD-10-CM

## 2013-05-03 DIAGNOSIS — F411 Generalized anxiety disorder: Secondary | ICD-10-CM

## 2013-05-03 DIAGNOSIS — M5431 Sciatica, right side: Secondary | ICD-10-CM

## 2013-05-03 DIAGNOSIS — F419 Anxiety disorder, unspecified: Secondary | ICD-10-CM

## 2013-05-03 DIAGNOSIS — M519 Unspecified thoracic, thoracolumbar and lumbosacral intervertebral disc disorder: Secondary | ICD-10-CM

## 2013-05-03 DIAGNOSIS — M5430 Sciatica, unspecified side: Secondary | ICD-10-CM

## 2013-05-03 MED ORDER — CYCLOBENZAPRINE HCL 10 MG PO TABS: 10.0000 mg | ORAL_TABLET | Freq: Three times a day (TID) | ORAL | Status: DC | PRN

## 2013-05-03 MED ORDER — OXYCODONE-ACETAMINOPHEN 10-325 MG PO TABS: 1.0000 | ORAL_TABLET | Freq: Three times a day (TID) | ORAL | Status: DC | PRN

## 2013-05-03 MED ORDER — ALPRAZOLAM 0.5 MG PO TABS: 0.5000 mg | ORAL_TABLET | Freq: Three times a day (TID) | ORAL | Status: DC | PRN

## 2013-05-03 MED ORDER — METOPROLOL TARTRATE 25 MG PO TABS: 25.0000 mg | ORAL_TABLET | Freq: Two times a day (BID) | ORAL | Status: DC

## 2013-05-03 NOTE — Assessment & Plan Note (Signed)
Related to multi-level degenerative disk disease. Continue gabapentin, which significantly helps.

## 2013-05-03 NOTE — Patient Instructions (Signed)
Thank you for coming in, today!  Make sure your follow up with the pain clinic on 11/10. I refilled your Percocet, Flexeril, and Xanax today. I also refilled your metoprolol. I didn't change any medications. Make sure to follow up with your cardiologist, as well.  Come back to see me in 2-3 months. Make an appointment if you need to see me sooner.  Please feel free to call with any questions or concerns at any time, at [phone]. --Dr. Casper Harrison

## 2013-05-03 NOTE — Progress Notes (Signed)
  Subjective:    Patient ID: [name], male    DOB: 22-Sep-1975, 37 y.o.   MRN: [id number]  HPI: Pt presents to clinic for general f/u and for medication refills. Primary complaint of back pain, though improved since last visit.  Back pain - chronic in nature, secondary to severe / extensive multi-level degenerative disk disease -helped with Percocet Rx at last visit, though pt has "tried not to take it" due to not wanting to depend on meds and side effect of constipation -no longer taking Flexeril (out of refills), still taking Neurontin (helps neuropathic pain), no help with tramadol for several months -secondary constipation from narcotic has been better with probiotic -has appt with pain clinic on Nov 10 and does request refill on Percocet until then  Anxiety - taking Xanax up to TID (as prescribed), more often BID or only at night time -increased anxiety with social stressors (recently split up with girlfriend, finances, had to quit classes at [employer] due to absences from back pain, though working on returning) -generally feels "okay," day to day, and dislikes having to take medication at all  HTN / extensive CAD - requests refill on metoprolol, reports compliance with medications -denies headache, chest pain, palpitations, change in vision -does occasionally have chest tightness, but attributes it to very stressful days related to financial aid and having to quit classes at [employer], etc  Review of Systems: as above; generally feels well.     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

## 2013-05-03 NOTE — Assessment & Plan Note (Signed)
Exacerbated by pain and psychosocial stressors, though pt is taking Xanax as prescribed or less often. Refilled today. F/u PRN -- may need to discontinue Rx from me if pain clinic requires all controlled substances to come from them, though I would favor continuing it since pt has had significant relief of anxiety with this medication, does not appear to be overusing it, and it may be helping pain control from a multi-modal perspective.

## 2013-05-03 NOTE — Assessment & Plan Note (Signed)
Reports compliance with multiple meds. Metoprolol refilled today. BP elevated today, though may be from acute pain. Need to consider increasing agents if necessary vs deferring to cardiologist.

## 2013-05-03 NOTE — Assessment & Plan Note (Signed)
Improved recently with Percocet, unable to afford PT/rehab and several surgeons unwilling to operate in the past due to extensive cardiac history. Upcoming pain clinic appt in about 1 month. Also fairly significant component of muscle spasm and pt has not had muscle relaxer in several weeks. Rx refilled for Flexeril and Percocet today with understanding that pain clinic will need to be managing any chronic narcotics in the future, if any are prescribed. F/u with pain clinic and with me as needed.

## 2013-05-25 ENCOUNTER — Other Ambulatory Visit: Payer: Self-pay

## 2013-05-29 ENCOUNTER — Encounter: Payer: Self-pay | Admitting: Physical Medicine & Rehabilitation

## 2013-05-29 ENCOUNTER — Ambulatory Visit (HOSPITAL_BASED_OUTPATIENT_CLINIC_OR_DEPARTMENT_OTHER): Payer: Medicaid Other | Admitting: Physical Medicine & Rehabilitation

## 2013-05-29 ENCOUNTER — Encounter: Payer: Medicaid Other | Attending: Physical Medicine & Rehabilitation

## 2013-05-29 VITALS — BP 135/68 | HR 72 | Resp 14 | Ht 70.0 in | Wt 270.0 lb

## 2013-05-29 DIAGNOSIS — M5431 Sciatica, right side: Secondary | ICD-10-CM

## 2013-05-29 DIAGNOSIS — Z5181 Encounter for therapeutic drug level monitoring: Secondary | ICD-10-CM

## 2013-05-29 DIAGNOSIS — M549 Dorsalgia, unspecified: Secondary | ICD-10-CM | POA: Insufficient documentation

## 2013-05-29 DIAGNOSIS — M519 Unspecified thoracic, thoracolumbar and lumbosacral intervertebral disc disorder: Secondary | ICD-10-CM

## 2013-05-29 DIAGNOSIS — M546 Pain in thoracic spine: Secondary | ICD-10-CM | POA: Insufficient documentation

## 2013-05-29 DIAGNOSIS — Z79899 Other long term (current) drug therapy: Secondary | ICD-10-CM

## 2013-05-29 DIAGNOSIS — R209 Unspecified disturbances of skin sensation: Secondary | ICD-10-CM | POA: Insufficient documentation

## 2013-05-29 DIAGNOSIS — G894 Chronic pain syndrome: Secondary | ICD-10-CM | POA: Insufficient documentation

## 2013-05-29 DIAGNOSIS — M5430 Sciatica, unspecified side: Secondary | ICD-10-CM

## 2013-05-29 DIAGNOSIS — IMO0001 Reserved for inherently not codable concepts without codable children: Secondary | ICD-10-CM

## 2013-05-29 DIAGNOSIS — M5126 Other intervertebral disc displacement, lumbar region: Secondary | ICD-10-CM | POA: Insufficient documentation

## 2013-05-29 NOTE — Progress Notes (Signed)
Subjective:    Patient ID: [name], male    DOB: Feb 14, 1976, 37 y.o.   MRN: [id number]  HPI 37 year old male with extensive cardiac history who has undergone bypass graft approximately 3-4 years ago, complaints of numbness and tingling first in the left arm than the right arm of 1.5 year duration. Neck pain developed and later back pain. Onset was about the time when he went on disability for his cardiac condition. Was working up until that time. Was working as a Haematologist.  Has been evaluated by orthopedic surgeon as well as neuro surgeon. No surgery was recommended secondary to cardiac condition.  Prior history of neck injury at work in 2009. CT scan was unremarkable. Also had EMG test but does not recall the results.  MRI of the lumbar spine shows L5-S1 disc bulge. Some narrowing of the neural foramina. No significant central stenosis  Complains of dropping objects in both hands. This happens several times a month. He also has numbness in the hands that he feels at night. Also complains of leg weakness. Right side greater than left side after walking 1 mile. Complains of right calf and right anterior leg pain.  Trying to increase walking  distance to 1.5 miles.  Opioid risk tool score 5 placing him at moderate risk  Review of Systems  Constitutional: Positive for diaphoresis and appetite change.  Gastrointestinal: Positive for nausea.  Musculoskeletal: Positive for back pain and gait problem.       Spasms  Neurological: Positive for dizziness, tremors, weakness and numbness.       Tingling  Hematological: Bruises/bleeds easily.  Psychiatric/Behavioral: Positive for dysphoric mood. The patient is nervous/anxious.   All other systems reviewed and are negative.       Objective:   Physical Exam  Nursing note and vitals reviewed. Constitutional: He is oriented to person, place, and time. He appears well-developed.  Obese gained 30lb since back started hurting  HENT:   Head: Normocephalic and atraumatic.  Eyes: Conjunctivae and EOM are normal. Pupils are equal, round, and reactive to light.  Neck: Normal range of motion.  Cardiovascular: Normal rate, regular rhythm and normal heart sounds.   No murmur heard. Pulmonary/Chest: Effort normal and breath sounds normal.  Abdominal: Soft. Bowel sounds are normal.  Musculoskeletal:       Thoracic back: He exhibits tenderness. He exhibits normal range of motion, no deformity and no spasm.       Lumbar back: He exhibits tenderness. He exhibits normal range of motion, no deformity and no spasm.  Neurological: He is alert and oriented to person, place, and time. He has normal strength. He displays no atrophy. No sensory deficit. Coordination and gait normal.  Reflex Scores:      Tricep reflexes are 2+ on the right side and 2+ on the left side.      Bicep reflexes are 2+ on the right side and 2+ on the left side.      Brachioradialis reflexes are 2+ on the right side and 2+ on the left side.      Patellar reflexes are 2+ on the right side and 2+ on the left side.      Achilles reflexes are 2+ on the right side and 2+ on the left side. Able to sense sharp touch in all 4 limbs.  Psychiatric: He has a normal mood and affect.           Pain Inventory Average Pain 8 Pain Right Now 8 My  pain is sharp, burning, stabbing, tingling and aching  In the last 24 hours, has pain interfered with the following? General activity 0 Relation with others 0 Enjoyment of life 0 What TIME of day is your pain at its worst? all Sleep (in general) Poor  Pain is worse with: walking, bending, sitting, inactivity and standing Pain improves with: medication Relief from Meds: 5  Mobility walk without assistance ability to climb steps?  yes do you drive?  yes  Function disabled: date disabled .  Neuro/Psych weakness numbness tremor tingling trouble walking spasms dizziness confusion depression anxiety  Prior  Studies Any changes since last visit?  no  Physicians involved in your care Any changes since last visit?  no   Family History  Problem Relation Age of Onset  . Cancer Mother   . Cancer Sister   . Diabetes Mother   . Diabetes Sister   . Hyperlipidemia Mother   . Hypertension Mother   . Hyperlipidemia Sister   . Hypertension Sister   . Early death Mother   . Early death Sister    History   Social History  . Marital Status: Divorced    Spouse Name: N/A    Number of Children: N/A  . Years of Education: N/A   Social History Main Topics  . Smoking status: Current Some Day Smoker -- 0.20 packs/day for 20 years    Types: Cigarettes    Last Attempt to Quit: 12/20/2012  . Smokeless tobacco: Never Used  . Alcohol Use: No     Comment: maybe two beers per month  . Drug Use: No     Comment: maybe once a month  . Sexual Activity: None   Other Topics Concern  . None   Social History Narrative  . None   Past Surgical History  Procedure Laterality Date  . Coronary artery bypass graft      status post diaphragmatic wall infarction Rx BMS RCA 03-11-08   . Orthopedic surgery    . Coronary stent placement     Past Medical History  Diagnosis Date  . Hypertension   . MI (myocardial infarction)   . Hx of CABG   . Heart attack   . Bronchitis   . Tobacco user   . Hyperlipidemia   . MYOCARDIAL INFARCTION 10/13/2008    Qualifier: Diagnosis of  By: Juanda Chance, MD, Johny Chess    BP 135/68  Pulse 72  Resp 14  Ht 5\' 10"  (1.778 m)  Wt 270 lb (122.471 kg)  BMI 38.74 kg/m2  SpO2 95%  Impression  1. Chronic back pain. Patient has both myofascial pain in the thoracic area and some symptoms referral pull to L5-S1 disc. Remaining lumbar levels have normal appearance. MRI demonstrates disc herniation more toward the left than the right side some not certain whether the right-sided pain represents sciatica.  He is on chronic Plavix and complains that he developed chest pains 2  days after coming off Plavix for dental procedure. He is unwilling to try this again. Therefore epidural steroid injections are not recommended  Will ask PT to instruct lumbar and thoracic paraspinal strengthening exercises  He is already on gabapentin 3 times per day of I've asked him to increase at 4 times a day and monitor   2. Upper extremity paresthesias. CT of the neck in 2009 was clean. Symptoms sound more like carpal tunnel syndrome and will therefore evaluate with electrodiagnostic studies.  3. Chronic pain syndrome that is corresponded with reduced  level of activity starting with his disability. He is also gained 30 pounds in the last 18 months. Recommend continued ambulation, may do well with exercise bike at low resistance.  RTC one month

## 2013-05-29 NOTE — Patient Instructions (Addendum)
Next visit will be for EMG test of the arms to evaluate for a pinched nerve. In the meantime get to wrist splints from the pharmacy and wear them at night  Physical therapy orders written for exercise program  Increased gabapentin to 4 times per day

## 2013-06-05 ENCOUNTER — Telehealth: Payer: Self-pay | Admitting: Family Medicine

## 2013-06-05 NOTE — Telephone Encounter (Addendum)
Pt calling about receiving pain med rx.  Went to his appt at the Pain Clinic and had a drug screen done.  Was told that he could not get a rx for pain med until his labs came back and to contact his primary for pain refill.  Supposed to have spoken with someone here and told his doctor's next available was 12/3, but someone else could see him and write rx.  Verified this with Dr. Mauricio Po and was told to inform patient that he would need to see his physician or pain mgt provider.  Told Mr. [name] this and he stated that; "This is ridiculous".  Patient hung up.

## 2013-06-06 NOTE — Telephone Encounter (Signed)
Left voicemail for pt apologizing for confusion/frustration. No need to call patient back today. Pt was seen at pain clinic recently and I have been in contact with Dr. Wynn Banker -- I will be continuing to prescribe [name]'s pain medication, hopefully on a short-term basis, as Dr. Wynn Banker does not think he will need to be a long-term pain clinic patient. I will plan to call pt tomorrow to clarify if/when he needs to be seen, or if I can write a paper Rx for him to pick up at the front desk. My voicemail stated that I will plan to call him back tomorrow, and instructed him to call to leave me a message if he needs / wants, in the meantime. Thanks. --CMS

## 2013-06-06 NOTE — Telephone Encounter (Signed)
Patient canceled physical therapy due to medicaid only paying for initial evaluation and can't afford to go.  What would be you recommendations?  Please call patient.

## 2013-06-07 ENCOUNTER — Ambulatory Visit: Payer: Medicaid Other

## 2013-06-07 NOTE — Telephone Encounter (Signed)
Called pt to clarify confusion and to follow up on scheduling appointments. Discussed with him that I will continue to fill narcotics on an as-needed basis; he does not request a refill at this time. He states he has an appointment coming up in a couple of weeks with Dr. Wynn Banker and will discuss with him, at that point. Will plan to see pt in follow-up after he sees. Dr. Wynn Banker again. Greatly appreciate the assistance in managing this patient's care. --CMS

## 2013-06-30 ENCOUNTER — Ambulatory Visit: Payer: Medicaid Other | Admitting: Physical Medicine & Rehabilitation

## 2013-06-30 ENCOUNTER — Encounter: Payer: Medicaid Other | Attending: Physical Medicine & Rehabilitation

## 2013-06-30 DIAGNOSIS — M5126 Other intervertebral disc displacement, lumbar region: Secondary | ICD-10-CM | POA: Insufficient documentation

## 2013-06-30 DIAGNOSIS — M546 Pain in thoracic spine: Secondary | ICD-10-CM | POA: Insufficient documentation

## 2013-06-30 DIAGNOSIS — IMO0001 Reserved for inherently not codable concepts without codable children: Secondary | ICD-10-CM | POA: Insufficient documentation

## 2013-06-30 DIAGNOSIS — M549 Dorsalgia, unspecified: Secondary | ICD-10-CM | POA: Insufficient documentation

## 2013-06-30 DIAGNOSIS — G894 Chronic pain syndrome: Secondary | ICD-10-CM | POA: Insufficient documentation

## 2013-06-30 DIAGNOSIS — R209 Unspecified disturbances of skin sensation: Secondary | ICD-10-CM | POA: Insufficient documentation

## 2013-07-03 ENCOUNTER — Ambulatory Visit: Payer: Medicaid Other | Admitting: Family Medicine

## 2013-07-12 ENCOUNTER — Ambulatory Visit: Payer: Medicaid Other | Admitting: Cardiovascular Disease

## 2013-07-19 ENCOUNTER — Telehealth: Payer: Self-pay | Admitting: Family Medicine

## 2013-07-19 NOTE — Telephone Encounter (Signed)
Will fwd. To PCP for refill. .Jannelle Notaro  

## 2013-07-19 NOTE — Telephone Encounter (Signed)
Completed PA info in [place] [employer] for losartan.  Status pending.  Will recheck status in 24 hours.  Renn Stille Ann, RN   

## 2013-07-19 NOTE — Telephone Encounter (Signed)
Pt has Rx with refills at [facility], but needs prior auth completed. Will complete and fax this, today. Thanks.  --CMS

## 2013-07-19 NOTE — Telephone Encounter (Signed)
Pt called and needs a refill on his losartan. Myriam Jacobson

## 2013-07-19 NOTE — Telephone Encounter (Signed)
Prior approval for losartan completed via [place] [employer].  Med approved for 07/19/13 - 07/19/14  Prior approval # [id number].  [facility] pharmacy informed.  Gaylene Brooks, RN

## 2013-08-04 ENCOUNTER — Encounter: Payer: Medicaid Other | Attending: Physical Medicine & Rehabilitation

## 2013-08-04 ENCOUNTER — Ambulatory Visit (HOSPITAL_BASED_OUTPATIENT_CLINIC_OR_DEPARTMENT_OTHER): Payer: Medicaid Other | Admitting: Physical Medicine & Rehabilitation

## 2013-08-04 ENCOUNTER — Encounter: Payer: Self-pay | Admitting: Physical Medicine & Rehabilitation

## 2013-08-04 VITALS — BP 142/77 | HR 83 | Resp 14 | Ht 71.0 in | Wt 291.0 lb

## 2013-08-04 DIAGNOSIS — R209 Unspecified disturbances of skin sensation: Secondary | ICD-10-CM

## 2013-08-04 DIAGNOSIS — IMO0001 Reserved for inherently not codable concepts without codable children: Secondary | ICD-10-CM | POA: Insufficient documentation

## 2013-08-04 DIAGNOSIS — M5126 Other intervertebral disc displacement, lumbar region: Secondary | ICD-10-CM | POA: Insufficient documentation

## 2013-08-04 DIAGNOSIS — G894 Chronic pain syndrome: Secondary | ICD-10-CM | POA: Insufficient documentation

## 2013-08-04 DIAGNOSIS — M549 Dorsalgia, unspecified: Secondary | ICD-10-CM | POA: Insufficient documentation

## 2013-08-04 DIAGNOSIS — M546 Pain in thoracic spine: Secondary | ICD-10-CM | POA: Insufficient documentation

## 2013-08-04 MED ORDER — METHOCARBAMOL 500 MG PO TABS: 500.0000 mg | ORAL_TABLET | Freq: Three times a day (TID) | ORAL | Status: DC

## 2013-08-04 NOTE — Patient Instructions (Signed)
Back Exercises These exercises may help you when beginning to rehabilitate your injury. Your symptoms may resolve with or without further involvement from your physician, physical therapist or athletic trainer. While completing these exercises, remember:   Restoring tissue flexibility helps normal motion to return to the joints. This allows healthier, less painful movement and activity.  An effective stretch should be held for at least 30 seconds.  A stretch should never be painful. You should only feel a gentle lengthening or release in the stretched tissue. STRETCH  Extension, Prone on Elbows   Lie on your stomach on the floor, a bed will be too soft. Place your palms about shoulder width apart and at the height of your head.  Place your elbows under your shoulders. If this is too painful, stack pillows under your chest.  Allow your body to relax so that your hips drop lower and make contact more completely with the floor.  Hold this position for ________30__ seconds.  Slowly return to lying flat on the floor. Repeat ___2_______ times. Complete this exercise _______2___ times per day.  RANGE OF MOTION  Extension, Prone Press Ups   Lie on your stomach on the floor, a bed will be too soft. Place your palms about shoulder width apart and at the height of your head.  Keeping your back as relaxed as possible, slowly straighten your elbows while keeping your hips on the floor. You may adjust the placement of your hands to maximize your comfort. As you gain motion, your hands will come more underneath your shoulders.  Hold this position ______30____ seconds.  Slowly return to lying flat on the floor. Repeat ____2______ times. Complete this exercise _____2_____ times per day.  RANGE OF MOTION- Quadruped, Neutral Spine   Assume a hands and knees position on a firm surface. Keep your hands under your shoulders and your knees under your hips. You may place padding under your knees for  comfort.  Drop your head and point your tail bone toward the ground below you. This will round out your low back like an angry cat. Hold this position for __________ seconds.  Slowly lift your head and release your tail bone so that your back sags into a large arch, like an old horse.  Hold this position for ______30____ seconds.  Repeat this until you feel limber in your low back.  Now, find your "sweet spot." This will be the most comfortable position somewhere between the two previous positions. This is your neutral spine. Once you have found this position, tense your stomach muscles to support your low back.  Hold this position for _____30_____ seconds. Repeat _____2_____ times. Complete this exercise _____2_____ times per day.  STRETCH  Flexion, Single Knee to Chest   Lie on a firm bed or floor with both legs extended in front of you.  Keeping one leg in contact with the floor, bring your opposite knee to your chest. Hold your leg in place by either grabbing behind your thigh or at your knee.  Pull until you feel a gentle stretch in your low back. Hold ____30______ seconds.  Slowly release your grasp and repeat the exercise with the opposite side. Repeat ____2______ times. Complete this exercise _____2_____ times per day.  STRETCH - Hamstrings, Standing  Stand or sit and extend your right / left leg, placing your foot on a chair or foot stool  Keeping a slight arch in your low back and your hips straight forward.  Lead with your chest and  lean forward at the waist until you feel a gentle stretch in the back of your right / left knee or thigh. (When done correctly, this exercise requires leaning only a small distance.)  Hold this position for ___30_______ seconds. Repeat __2________ times. Complete this stretch ______2____ times per day. STRENGTHENING  Deep Abdominals, Pelvic Tilt   Lie on a firm bed or floor. Keeping your legs in front of you, bend your knees so they are both  pointed toward the ceiling and your feet are flat on the floor.  Tense your lower abdominal muscles to press your low back into the floor. This motion will rotate your pelvis so that your tail bone is scooping upwards rather than pointing at your feet or into the floor.  With a gentle tension and even breathing, hold this position for _____10_____ seconds. Repeat ___10_______ times. Complete this exercise ____2______ times per day.  STRENGTHENING  Abdominals, Crunches   Lie on a firm bed or floor. Keeping your legs in front of you, bend your knees so they are both pointed toward the ceiling and your feet are flat on the floor. Cross your arms over your chest.  Slightly tip your chin down without bending your neck.  Tense your abdominals and slowly lift your trunk high enough to just clear your shoulder blades. Lifting higher can put excessive stress on the low back and does not further strengthen your abdominal muscles.  Control your return to the starting position. Repeat ____10_____ times. Complete this exercise ______1____ times per day.  STRENGTHENING  Quadruped, Opposite UE/LE Lift   Assume a hands and knees position on a firm surface. Keep your hands under your shoulders and your knees under your hips. You may place padding under your knees for comfort.  Find your neutral spine and gently tense your abdominal muscles so that you can maintain this position. Your shoulders and hips should form a rectangle that is parallel with the floor and is not twisted.  Keeping your trunk steady, lift your right hand no higher than your shoulder and then your left leg no higher than your hip. Make sure you are not holding your breath. Hold this position ____10______ seconds.  Continuing to keep your abdominal muscles tense and your back steady, slowly return to your starting position. Repeat with the opposite arm and leg. Repeat ___10_______ times. Complete this exercise ______2____ times per  day. Document Released: 07/24/2005 Document Revised: 09/28/2011 Document Reviewed: 10/18/2008 [facility] Patient Information 2014 [place], [place].

## 2013-08-04 NOTE — Progress Notes (Signed)
EMG performed 08/04/2013.  See EMG report under media tab.

## 2013-08-05 ENCOUNTER — Other Ambulatory Visit: Payer: Self-pay | Admitting: Family Medicine

## 2013-08-05 ENCOUNTER — Other Ambulatory Visit: Payer: Self-pay | Admitting: Cardiovascular Disease

## 2013-08-08 ENCOUNTER — Other Ambulatory Visit: Payer: Self-pay

## 2013-08-08 ENCOUNTER — Ambulatory Visit: Payer: Medicaid Other | Admitting: Family Medicine

## 2013-08-08 MED ORDER — NITROGLYCERIN 0.4 MG SL SUBL: 0.4000 mg | SUBLINGUAL_TABLET | SUBLINGUAL | Status: DC | PRN

## 2013-08-10 ENCOUNTER — Ambulatory Visit (INDEPENDENT_AMBULATORY_CARE_PROVIDER_SITE_OTHER): Payer: Medicaid Other | Admitting: Family Medicine

## 2013-08-10 ENCOUNTER — Other Ambulatory Visit: Payer: Self-pay | Admitting: Family Medicine

## 2013-08-10 ENCOUNTER — Encounter: Payer: Self-pay | Admitting: Family Medicine

## 2013-08-10 VITALS — BP 130/83 | HR 75 | Temp 97.8°F | Ht 71.0 in | Wt 288.0 lb

## 2013-08-10 DIAGNOSIS — M5430 Sciatica, unspecified side: Secondary | ICD-10-CM

## 2013-08-10 DIAGNOSIS — E785 Hyperlipidemia, unspecified: Secondary | ICD-10-CM

## 2013-08-10 DIAGNOSIS — M519 Unspecified thoracic, thoracolumbar and lumbosacral intervertebral disc disorder: Secondary | ICD-10-CM

## 2013-08-10 DIAGNOSIS — I10 Essential (primary) hypertension: Secondary | ICD-10-CM

## 2013-08-10 DIAGNOSIS — M5431 Sciatica, right side: Secondary | ICD-10-CM

## 2013-08-10 DIAGNOSIS — I2510 Atherosclerotic heart disease of native coronary artery without angina pectoris: Secondary | ICD-10-CM

## 2013-08-10 DIAGNOSIS — F17200 Nicotine dependence, unspecified, uncomplicated: Secondary | ICD-10-CM

## 2013-08-10 DIAGNOSIS — F411 Generalized anxiety disorder: Secondary | ICD-10-CM

## 2013-08-10 DIAGNOSIS — F419 Anxiety disorder, unspecified: Secondary | ICD-10-CM

## 2013-08-10 MED ORDER — LOSARTAN POTASSIUM 100 MG PO TABS: 50.0000 mg | ORAL_TABLET | Freq: Two times a day (BID) | ORAL | Status: DC

## 2013-08-10 MED ORDER — ALPRAZOLAM 0.5 MG PO TABS: 0.5000 mg | ORAL_TABLET | Freq: Three times a day (TID) | ORAL | Status: DC | PRN

## 2013-08-10 MED ORDER — ISOSORBIDE MONONITRATE ER 30 MG PO TB24: 30.0000 mg | ORAL_TABLET | Freq: Every day | ORAL | Status: DC

## 2013-08-10 MED ORDER — OXYCODONE-ACETAMINOPHEN 10-325 MG PO TABS
0.5000 | ORAL_TABLET | Freq: Three times a day (TID) | ORAL | Status: DC | PRN
Start: 2013-08-10 — End: 2014-03-01

## 2013-08-10 NOTE — Patient Instructions (Signed)
Thank you for coming in, today!  Keep taking your medicines as prescribed. I didn't change anything, today. Double check with Dr. Wynn BankerKirsteins and Dr. Clifton JamesMcAlhany about holding your Plavix next month.  I refilled your Xanax and oxycodone, today. Use the Xanax as little as possible. Take the oxycodone before you exercise to see if it helps you move move to see if that'll help you lose weight.  Follow up with Dr. Wynn BankerKirsteins next month. Come back to see me in March or April.  Please feel free to call with any questions or concerns at any time, at [phone]. --Dr. Casper HarrisonStreet

## 2013-08-10 NOTE — Progress Notes (Signed)
   Subjective:    Patient ID: [name], male    DOB: 03/03/1976, 38 y.o.   MRN: [id number]  HPI: Pt presents to clinic for general follow-up.  Back pain / sciatica - seeing Dr. Wynn BankerKirsteins, planning epidural injection next month - otherwise has been similar to previous visits  -radicular pain right side worse than left  -lumbar pain worse with activity / bending / lifting  -muscle spasms helped with Robaxin, off Flexeril - has been stopped on gabapentin, and has not had any oxycodone in a month or two - has been trying to walk / exercise more but pain has limited it - pain is worse in the cold weather  Anxiety - reports good control with Xanax, using sparingly (last Rx in October) - mostly increased with doctor's office visits, holidays, pain - has been doing better, but does request a refill  HTN / CAD - reports no issues with medications; needs refills on Imdur and Cozaar - does take losartan half a tablet BID - has had some LE swelling intermittently but denies chest pain, SOB, headaches - has an upcoming visit with Dr. Clifton JamesMcAlhany  HLD - reports compliance with Lipitor, no acute / new side effects or issues  Pt is a former smoker, stopped about 1 month ago. Now using e-vapor device. In addition to the above documentation, pt's PMH, surgical history, FH, and SH all reviewed and updated where appropriate in the EMR. I have also reviewed and updated the pt's allergies and current medications as appropriate.  Review of Systems: As above. Otherwise, full 12-system ROS was reviewed and all negative.     Objective:   Physical Exam BP 130/83  Pulse 75  Temp(Src) 97.8 F (36.6 C) (Oral)  Ht 5\' 11"  (1.803 m)  Wt 288 lb (130.636 kg)  BMI 40.19 kg/m2 Gen: well-appearing adult male in NAD HEENT: [place]/AT, sclerae/conjunctivae clear, no lid lag, EOMI, PERRLA   MMM, posterior oropharynx clear, no cervical lymphadenopathy  neck supple with full ROM, no masses appreciated; thyroid not  enlarged  Cardio: RRR, no murmur appreciated; distal pulses intact/symmetric Pulm: CTAB, no wheezes, normal WOB  Abd: soft, nondistended, BS+, no HSM Ext: warm/well-perfused, no cyanosis/clubbing/edema MSK: strength 5/5 in all four extremities, no frank joint deformity/effusion  normal ROM to all four extremities  Sitting straight leg lift test positive for radiculopathy on right, negative on left  Marked point tenderness in lumbar spine around L2-L3  Mild muscle spasm higher up on back, low-thoracic region, paraspinally Neuro/Psych: alert/oriented, sensation grossly intact; normal gait/balance  mood euthymic with congruent affect, more cheerful than previous visits     Assessment & Plan:

## 2013-08-10 NOTE — Assessment & Plan Note (Signed)
Relatively at baseline with no new symptoms, though some intermittent increased pain with activity. Has been without Percocet in several weeks, following with Dr. Wynn BankerKirsteins at pain clinic with steroid injection planned next month. Doing well from a muscle spasm standpoint with Robaxin. Refilled 1 month of Percocet to last until back injection, at least, with hope that this will improve his mobility enough to exercise and try to lose weight. F/u with pain clinic and with me as needed.

## 2013-08-10 NOTE — Assessment & Plan Note (Signed)
No acute chest pain, compliant with multiple medications, and with cardiology f/u pending in about a week. Continue per Dr. Gibson RampMcAlhany's management. Liable to need to hold Plavix prior to upcoming back injection, but will defer to Drs. McAlhany and Kirsteins.

## 2013-08-10 NOTE — Assessment & Plan Note (Signed)
Reports compliance with multiple meds. Refilled Cozaar today. F/u with cardiology in about a week. BP mildly elevated today, ?related to acute on chronic back pain but no warning signs such as chest pain, headache, etc. F/u as needed.

## 2013-08-10 NOTE — Assessment & Plan Note (Signed)
Related to multi-level degenerative intervertebral disk disease. Off of gabapentin per pain clinic. See separate problem list note; continue Robaxin, Rx today for Percocet, f/u with pain clinic for back injection next month.

## 2013-08-10 NOTE — Assessment & Plan Note (Signed)
Compliant with Lipitor and Tricor. No new issues / side effects. Continue current doses, continue CMP and / or lipid panel at f/u.

## 2013-08-10 NOTE — Assessment & Plan Note (Signed)
No longer smoking, but now using e-vapor device. Advised that stopping smoking is a great thing, but that vapor devices have their own issues. Encouraged stopping everything, but will f/u as needed.

## 2013-08-10 NOTE — Assessment & Plan Note (Signed)
Generally doing well with Xanax PRN, using less often than maximally prescribed. May also be helping with management of chronic back pain. Comfortable refilling #60 today with instructions to take 0.5 mg TID as needed. F/u as needed.

## 2013-08-11 ENCOUNTER — Telehealth: Payer: Self-pay | Admitting: Family Medicine

## 2013-08-11 NOTE — Telephone Encounter (Addendum)
Prior approval for fenofibrate completed via [employer].  Med approved for 08/11/13 - 08/11/14  Prior approval # [id number] W.  [facility] informed.  Gaylene Brooksichardson, Jeannette Ann, RN

## 2013-08-11 NOTE — Telephone Encounter (Signed)
Completed prior auth form for fenofibrate. Rationale: pt intolerant to gemfibrozil (GI side effects), only preferred drug available. Form given to A. Benton, Charity fundraiserN. Thanks. --CMS

## 2013-08-15 ENCOUNTER — Encounter: Payer: Self-pay | Admitting: Cardiovascular Disease

## 2013-08-15 ENCOUNTER — Ambulatory Visit (INDEPENDENT_AMBULATORY_CARE_PROVIDER_SITE_OTHER): Payer: Medicaid Other | Admitting: Cardiovascular Disease

## 2013-08-15 VITALS — BP 120/96 | HR 82 | Ht 71.0 in | Wt 286.0 lb

## 2013-08-15 DIAGNOSIS — I10 Essential (primary) hypertension: Secondary | ICD-10-CM

## 2013-08-15 DIAGNOSIS — F17201 Nicotine dependence, unspecified, in remission: Secondary | ICD-10-CM

## 2013-08-15 DIAGNOSIS — Z87891 Personal history of nicotine dependence: Secondary | ICD-10-CM

## 2013-08-15 DIAGNOSIS — I2510 Atherosclerotic heart disease of native coronary artery without angina pectoris: Secondary | ICD-10-CM

## 2013-08-15 DIAGNOSIS — E785 Hyperlipidemia, unspecified: Secondary | ICD-10-CM

## 2013-08-15 NOTE — Patient Instructions (Signed)
Your physician wants you to follow-up in:  6 months. You will receive a reminder letter in the mail two months in advance. If you don't receive a letter, please call our office to schedule the follow-up appointment.   Your physician recommends that you return for fasting lab work at the end of May--(lipid and liver profiles).  The lab opens at 7:30 AM every week day

## 2013-08-15 NOTE — Progress Notes (Signed)
History of Present Illness: 38 year old male with history of CAD with serial PCI and a 4-vessel CABG in 2009, HTN, HLD, tobacco abuse who is here today for cardiac follow up. [facility] Dr. Riley KillStuckey. Cardiac cath on 10/19/11 with stable CAD, bypasses. He was discharged home on Imdur. His coronary history began in 2009 with an inferior MI requiring RCA PCI and subsequent CABG. He then presented in 2010 with an NSTEMI in 2010 requiring LCx PCI. He underwent repeat coronary angiography in September of 2011 and was found to have stable native and bypass graft anatomy not requiring any intervention at the time. As above, last cath in April 2013 with stable disease. Started on Ranexa in April 2013. ( in assistance program). [facility] with [facility] pain. Ruled out for MI with serial cardiac enzymes.   He is here today for cardiac follow up. He has been doing well. He has had no chest pain. He is walking 1.5 miles in the mornings and having no pains. 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 Breathing is at baseline. He has stopped smoking. Anxiety controlled with prn Xanax, now written in primary care. He is still having back pain and has plans for a spinal injection. He needs to come off of Plavix for this.   Primary Care Physician: [facility]  Last Lipid Profile:Lipid Panel     Component Value Date/Time   CHOL 146 12/06/2012 0233   TRIG 284* 12/06/2012 0233   HDL 34* 12/06/2012 0233   CHOLHDL 4.3 12/06/2012 0233   VLDL 57* 12/06/2012 0233   LDLCALC 55 12/06/2012 0233    Past Medical History  Diagnosis Date  . Hypertension   . MI (myocardial infarction)   . Hx of CABG   . Heart attack   . Bronchitis   . Tobacco user   . Hyperlipidemia   . MYOCARDIAL INFARCTION 10/13/2008    Qualifier: Diagnosis of  By: Juanda ChanceBrodie, MD, Johny ChessFACC, Bruce  Rogers     Past Surgical History  Procedure Laterality Date  . Coronary artery bypass graft      status post diaphragmatic wall infarction Rx BMS RCA 03-11-08   . Orthopedic surgery    . Coronary stent placement      Current Outpatient Prescriptions  Medication Sig Dispense Refill  . ALPRAZolam (XANAX) 0.5 MG tablet Take 1 tablet (0.5 mg total) by mouth 3 (three) times daily as needed for anxiety.  60 tablet  0  . atorvastatin (LIPITOR) 80 MG tablet Take 80 mg by mouth every evening.      . clopidogrel (PLAVIX) 75 MG tablet Take 75 mg by mouth every morning.      . fenofibrate (TRICOR) 48 MG tablet Take 48 mg by mouth every morning.      . isosorbide mononitrate (IMDUR) 30 MG 24 hr tablet Take 1 tablet (30 mg total) by mouth daily.  30 tablet  3  . losartan (COZAAR) 100 MG tablet Take 0.5 tablets (50 mg total) by mouth 2 (two) times daily.  30 tablet  3  . methocarbamol (ROBAXIN) 500 MG tablet Take 1 tablet (500 mg total) by mouth 3 (three) times daily.  90 tablet  1  . metoprolol tartrate (LOPRESSOR) 25 MG tablet Take 1 tablet (25 mg total) by mouth 2 (two) times daily.  60  tablet  3  . Multiple Vitamin (MULITIVITAMIN WITH MINERALS) TABS Take 1 tablet by mouth every morning.       . nitroGLYCERIN (NITROSTAT) 0.4 MG SL tablet Place 1 tablet (0.4 mg total) under the tongue every 5 (five) minutes as needed for chest pain.  25 tablet  2  . oxyCODONE-acetaminophen (PERCOCET) 10-325 MG per tablet Take 0.5-1 tablets by mouth every 8 (eight) hours as needed for pain.  90 tablet  0  . Probiotic Product (PRO-BIOTIC BLEND) CAPS Take 1 capsule by mouth daily. Helps with digestion      . ranolazine (RANEXA) 500 MG 12 hr tablet Take 1 tablet (500 mg total) by mouth 2 (two) times daily.  180 tablet  3   No current facility-administered medications for this visit.    Allergies  Allergen Reactions  . Codeine Itching  . Celexa [Citalopram] Itching and Rash    History   Social History  . Marital  Status: Divorced    Spouse Name: N/A    Number of Children: N/A  . Years of Education: N/A   Occupational History  . Not on file.   Social History Main Topics  . Smoking status: Former Smoker -- 0.20 packs/day for 20 years    Types: Cigarettes    Quit date: 06/15/2013  . Smokeless tobacco: Never Used     Comment: E-Cigarette  . Alcohol Use: No     Comment: maybe two beers per month  . Drug Use: No     Comment: maybe once a month  . Sexual Activity: Not on file   Other Topics Concern  . Not on file   Social History Narrative  . No narrative on file    Family History  Problem Relation Age of Onset  . Cancer Mother   . Cancer Sister   . Diabetes Mother   . Diabetes Sister   . Hyperlipidemia Mother   . Hypertension Mother   . Hyperlipidemia Sister   . Hypertension Sister   . Early death Mother   . Early death Sister     Review of Systems:  As stated in the HPI and otherwise negative.   BP 120/96  Pulse 82  Ht 5\' 11"  (1.803 m)  Wt 286 lb (129.729 kg)  BMI 39.91 kg/m2  Physical Examination: General: Well developed, well nourished, NAD HEENT: OP clear, mucus membranes moist SKIN: warm, dry. No rashes. Neuro: No focal deficits Musculoskeletal: Muscle strength 5/5 all ext Psychiatric: Mood and affect normal Neck: No JVD, no carotid bruits, no thyromegaly, no lymphadenopathy. Lungs:Clear bilaterally, no wheezes, rhonci, crackles Cardiovascular: Regular rate and rhythm. No murmurs, gallops or rubs. Abdomen:Soft. Bowel sounds present. Non-tender.  Extremities: No lower extremity edema. Pulses are 2 + in the bilateral DP/PT.  Assessment and Plan:   1. CAD: Stable. Cath April 2013 with stable severe disease s/p CABG. He is doing well on Ranexa and Imdur. Will continue Plavix, statin, beta blocker, ARB. He does not tolerate ASA due to GI upset. He has plans for upcoming spinal injection. Will be ok to hold Plavix for one week before procedure. He should resume when  safe from bleeding standpoint post procedure.   2. HYPERLIPIDEMIA: Controlled. Continue statin and Tricor. Repeat lipids and LFTS here in our office May 2015.   3. TOBACCO USE, IN REMISSION: He has stopped smoking. I congratulated him on this.    4. HTN: BP well controlled. No changes.

## 2013-08-22 ENCOUNTER — Telehealth: Payer: Self-pay

## 2013-08-22 MED ORDER — FENOFIBRATE 48 MG PO TABS: 48.0000 mg | ORAL_TABLET | Freq: Every morning | ORAL | Status: DC

## 2013-08-22 MED ORDER — LOSARTAN POTASSIUM 100 MG PO TABS: 100.0000 mg | ORAL_TABLET | Freq: Every morning | ORAL | Status: DC

## 2013-08-22 MED ORDER — CLOPIDOGREL BISULFATE 75 MG PO TABS: 75.0000 mg | ORAL_TABLET | Freq: Every morning | ORAL | Status: DC

## 2013-08-22 MED ORDER — ATORVASTATIN CALCIUM 80 MG PO TABS: 80.0000 mg | ORAL_TABLET | Freq: Every evening | ORAL | Status: DC

## 2013-08-22 NOTE — Telephone Encounter (Signed)
Patient called regarding his injection.  He says the muscle relaxer has helped a lot and he does not think he needs the injection this time.  He would like to know if the muscle relaxer is something that he can take long term and skip injection?  Please advise.

## 2013-08-23 NOTE — Telephone Encounter (Addendum)
Usually don't advise muscle relaxer for long term use, but he can continue it for now and call for injection if it is no longer effective

## 2013-08-23 NOTE — Telephone Encounter (Signed)
Left message advising patient to call office regarding medication.

## 2013-08-23 NOTE — Telephone Encounter (Signed)
Tried to contact patient again regarding him taking his muscle relaxer.  Left message for him to return call.

## 2013-09-04 ENCOUNTER — Telehealth: Payer: Self-pay

## 2013-09-04 NOTE — Telephone Encounter (Signed)
Patient called to let us know that the Methocarbamol was really helping him and he wanted to know if Dr. Wynn BankerKirsteins was going to keep him on the medication. I informed the patient that at his next follow up visit that Dr. Wynn BankerKirsteins would discuss with him how he is doing with the medication.

## 2013-09-05 ENCOUNTER — Ambulatory Visit: Payer: Medicaid Other | Admitting: Physical Medicine & Rehabilitation

## 2013-09-17 ENCOUNTER — Inpatient Hospital Stay (HOSPITAL_COMMUNITY)
Admission: EM | Admit: 2013-09-17 | Discharge: 2013-09-18 | DRG: 343 | Disposition: A | Discharge status: Home / Self Care | Payer: Medicaid Other | Attending: Family Medicine | Admitting: Family Medicine

## 2013-09-17 ENCOUNTER — Emergency Department (HOSPITAL_COMMUNITY): Payer: Medicaid Other

## 2013-09-17 ENCOUNTER — Encounter (HOSPITAL_COMMUNITY)
Admission: EM | Disposition: A | Discharge status: Home / Self Care | Payer: Medicaid Other | Source: Home / Self Care | Attending: Family Medicine

## 2013-09-17 ENCOUNTER — Inpatient Hospital Stay (HOSPITAL_COMMUNITY): Payer: Medicaid Other | Admitting: Anesthesiology

## 2013-09-17 ENCOUNTER — Encounter (HOSPITAL_COMMUNITY): Payer: Self-pay | Admitting: Emergency Medicine

## 2013-09-17 ENCOUNTER — Inpatient Hospital Stay: Admit: 2013-09-17 | Payer: Self-pay | Admitting: Surgery

## 2013-09-17 ENCOUNTER — Encounter (HOSPITAL_COMMUNITY): Payer: Medicaid Other | Admitting: Anesthesiology

## 2013-09-17 ENCOUNTER — Inpatient Hospital Stay (HOSPITAL_COMMUNITY): Payer: Medicaid Other

## 2013-09-17 DIAGNOSIS — R1110 Vomiting, unspecified: Secondary | ICD-10-CM

## 2013-09-17 DIAGNOSIS — Z951 Presence of aortocoronary bypass graft: Secondary | ICD-10-CM

## 2013-09-17 DIAGNOSIS — Z87891 Personal history of nicotine dependence: Secondary | ICD-10-CM

## 2013-09-17 DIAGNOSIS — E785 Hyperlipidemia, unspecified: Secondary | ICD-10-CM | POA: Diagnosis present

## 2013-09-17 DIAGNOSIS — Z7902 Long term (current) use of antithrombotics/antiplatelets: Secondary | ICD-10-CM

## 2013-09-17 DIAGNOSIS — I252 Old myocardial infarction: Secondary | ICD-10-CM

## 2013-09-17 DIAGNOSIS — R109 Unspecified abdominal pain: Secondary | ICD-10-CM

## 2013-09-17 DIAGNOSIS — E782 Mixed hyperlipidemia: Secondary | ICD-10-CM | POA: Diagnosis present

## 2013-09-17 DIAGNOSIS — I2510 Atherosclerotic heart disease of native coronary artery without angina pectoris: Secondary | ICD-10-CM | POA: Diagnosis present

## 2013-09-17 DIAGNOSIS — M5430 Sciatica, unspecified side: Secondary | ICD-10-CM | POA: Diagnosis present

## 2013-09-17 DIAGNOSIS — F419 Anxiety disorder, unspecified: Secondary | ICD-10-CM | POA: Diagnosis present

## 2013-09-17 DIAGNOSIS — E669 Obesity, unspecified: Secondary | ICD-10-CM | POA: Diagnosis present

## 2013-09-17 DIAGNOSIS — F17200 Nicotine dependence, unspecified, uncomplicated: Secondary | ICD-10-CM

## 2013-09-17 DIAGNOSIS — Z79899 Other long term (current) drug therapy: Secondary | ICD-10-CM

## 2013-09-17 DIAGNOSIS — Z9049 Acquired absence of other specified parts of digestive tract: Secondary | ICD-10-CM | POA: Diagnosis present

## 2013-09-17 DIAGNOSIS — Z9861 Coronary angioplasty status: Secondary | ICD-10-CM

## 2013-09-17 DIAGNOSIS — K3580 Unspecified acute appendicitis: Secondary | ICD-10-CM

## 2013-09-17 DIAGNOSIS — R079 Chest pain, unspecified: Secondary | ICD-10-CM | POA: Diagnosis present

## 2013-09-17 DIAGNOSIS — I10 Essential (primary) hypertension: Secondary | ICD-10-CM | POA: Diagnosis present

## 2013-09-17 DIAGNOSIS — F411 Generalized anxiety disorder: Secondary | ICD-10-CM | POA: Diagnosis present

## 2013-09-17 DIAGNOSIS — M5431 Sciatica, right side: Secondary | ICD-10-CM | POA: Diagnosis present

## 2013-09-17 HISTORY — PX: LAPAROSCOPIC APPENDECTOMY: SHX408

## 2013-09-17 LAB — URINE MICROSCOPIC-ADD ON

## 2013-09-17 LAB — CBC WITH DIFFERENTIAL/PLATELET
Basophils Absolute: 0 10*3/uL (ref 0.0–0.1)
Basophils Relative: 0 % (ref 0–1)
EOS PCT: 0 % (ref 0–5)
Eosinophils Absolute: 0.1 10*3/uL (ref 0.0–0.7)
HEMATOCRIT: 44.1 % (ref 39.0–52.0)
Hemoglobin: 15.5 g/dL (ref 13.0–17.0)
LYMPHS ABS: 1.4 10*3/uL (ref 0.7–4.0)
LYMPHS PCT: 10 % — AB (ref 12–46)
MCH: 32.2 pg (ref 26.0–34.0)
MCHC: 35.1 g/dL (ref 30.0–36.0)
MCV: 91.5 fL (ref 78.0–100.0)
MONO ABS: 0.6 10*3/uL (ref 0.1–1.0)
Monocytes Relative: 4 % (ref 3–12)
Neutro Abs: 11.6 10*3/uL — ABNORMAL HIGH (ref 1.7–7.7)
Neutrophils Relative %: 85 % — ABNORMAL HIGH (ref 43–77)
Platelets: 243 10*3/uL (ref 150–400)
RBC: 4.82 MIL/uL (ref 4.22–5.81)
RDW: 13.7 % (ref 11.5–15.5)
WBC: 13.6 10*3/uL — AB (ref 4.0–10.5)

## 2013-09-17 LAB — COMPREHENSIVE METABOLIC PANEL
ALBUMIN: 4.2 g/dL (ref 3.5–5.2)
ALT: 29 U/L (ref 0–53)
AST: 23 U/L (ref 0–37)
Alkaline Phosphatase: 63 U/L (ref 39–117)
BUN: 8 mg/dL (ref 6–23)
CO2: 22 meq/L (ref 19–32)
CREATININE: 0.93 mg/dL (ref 0.50–1.35)
Calcium: 9.5 mg/dL (ref 8.4–10.5)
Chloride: 100 mEq/L (ref 96–112)
GFR calc Af Amer: >90 mL/min (ref >90)
Glucose, Bld: 149 mg/dL — ABNORMAL HIGH (ref 70–99)
Potassium: 4 mEq/L (ref 3.7–5.3)
SODIUM: 141 meq/L (ref 137–147)
Total Bilirubin: 0.6 mg/dL (ref 0.3–1.2)
Total Protein: 7.8 g/dL (ref 6.0–8.3)

## 2013-09-17 LAB — TSH: TSH: 1.802 u[IU]/mL (ref 0.350–4.500)

## 2013-09-17 LAB — LIPASE, BLOOD: Lipase: 29 U/L (ref 11–59)

## 2013-09-17 LAB — URINALYSIS, ROUTINE W REFLEX MICROSCOPIC
BILIRUBIN URINE: NEGATIVE
Glucose, UA: NEGATIVE mg/dL
HGB URINE DIPSTICK: NEGATIVE
Ketones, ur: 15 mg/dL — AB
LEUKOCYTES UA: NEGATIVE
Nitrite: NEGATIVE
PH: 6 (ref 5.0–8.0)
Protein, ur: 100 mg/dL — AB
Specific Gravity, Urine: 1.029 (ref 1.005–1.030)
Urobilinogen, UA: 1 mg/dL (ref 0.0–1.0)

## 2013-09-17 LAB — LIPID PANEL
Cholesterol: 206 mg/dL — ABNORMAL HIGH (ref 0–200)
HDL: 35 mg/dL — AB (ref >39)
LDL Cholesterol: UNDETERMINED mg/dL (ref 0–99)
Total CHOL/HDL Ratio: 5.9 RATIO
Triglycerides: 436 mg/dL — ABNORMAL HIGH (ref <150)
VLDL: UNDETERMINED mg/dL (ref 0–40)

## 2013-09-17 LAB — TROPONIN I
Troponin I: <0.3 ng/mL (ref <0.30)
Troponin I: <0.3 ng/mL (ref <0.30)

## 2013-09-17 LAB — HEMOGLOBIN A1C
HEMOGLOBIN A1C: 6.1 % — AB (ref <5.7)
Mean Plasma Glucose: 128 mg/dL — ABNORMAL HIGH (ref <117)

## 2013-09-17 LAB — D-DIMER, QUANTITATIVE: D-Dimer, Quant: <0.27 ug/mL-FEU (ref 0.00–0.48)

## 2013-09-17 SURGERY — APPENDECTOMY, LAPAROSCOPIC
Anesthesia: General | Site: Abdomen

## 2013-09-17 MED ORDER — GI COCKTAIL ~~LOC~~
30.0000 mL | Freq: Four times a day (QID) | ORAL | Status: DC | PRN
  Filled 2013-09-17: qty 30

## 2013-09-17 MED ORDER — SUCCINYLCHOLINE CHLORIDE 20 MG/ML IJ SOLN
INTRAMUSCULAR | Status: DC | PRN
  Administered 2013-09-17: 120 mg via INTRAVENOUS

## 2013-09-17 MED ORDER — SODIUM CHLORIDE 0.9 % IJ SOLN
INTRAMUSCULAR | Status: AC
  Filled 2013-09-17: qty 10

## 2013-09-17 MED ORDER — LIDOCAINE HCL (CARDIAC) 20 MG/ML IV SOLN
INTRAVENOUS | Status: DC | PRN
  Administered 2013-09-17: 80 mg via INTRAVENOUS

## 2013-09-17 MED ORDER — VECURONIUM BROMIDE 10 MG IV SOLR
INTRAVENOUS | Status: AC
  Filled 2013-09-17: qty 10

## 2013-09-17 MED ORDER — NITROGLYCERIN 0.4 MG SL SUBL: 0.4000 mg | SUBLINGUAL_TABLET | SUBLINGUAL | Status: DC | PRN

## 2013-09-17 MED ORDER — MORPHINE SULFATE 4 MG/ML IJ SOLN
4.0000 mg | Freq: Once | INTRAMUSCULAR | Status: AC
  Administered 2013-09-17: 4 mg via INTRAVENOUS
  Filled 2013-09-17: qty 1

## 2013-09-17 MED ORDER — RANOLAZINE ER 500 MG PO TB12
500.0000 mg | ORAL_TABLET | Freq: Two times a day (BID) | ORAL | Status: DC
  Administered 2013-09-17 – 2013-09-18 (×2): 500 mg via ORAL
  Filled 2013-09-17 (×3): qty 1

## 2013-09-17 MED ORDER — ALPRAZOLAM 0.5 MG PO TABS: 0.5000 mg | ORAL_TABLET | Freq: Three times a day (TID) | ORAL | Status: DC | PRN

## 2013-09-17 MED ORDER — FENOFIBRATE 54 MG PO TABS
54.0000 mg | ORAL_TABLET | Freq: Every day | ORAL | Status: DC
Start: 2013-09-18 — End: 2013-09-18
  Administered 2013-09-18: 54 mg via ORAL
  Filled 2013-09-17: qty 1

## 2013-09-17 MED ORDER — LACTATED RINGERS IV SOLN
INTRAVENOUS | Status: DC | PRN
  Administered 2013-09-17: 16:00:00 via INTRAVENOUS

## 2013-09-17 MED ORDER — ESMOLOL HCL 10 MG/ML IV SOLN
INTRAVENOUS | Status: AC
  Filled 2013-09-17: qty 10

## 2013-09-17 MED ORDER — MORPHINE SULFATE 2 MG/ML IJ SOLN: 2.0000 mg | INTRAMUSCULAR | Status: DC | PRN

## 2013-09-17 MED ORDER — FENTANYL CITRATE 0.05 MG/ML IJ SOLN
INTRAMUSCULAR | Status: AC
  Administered 2013-09-17: 50 ug via INTRAVENOUS
  Filled 2013-09-17: qty 2

## 2013-09-17 MED ORDER — SODIUM CHLORIDE 0.9 % IR SOLN
Status: DC | PRN
  Administered 2013-09-17: 1000 mL

## 2013-09-17 MED ORDER — PROPOFOL 10 MG/ML IV BOLUS
INTRAVENOUS | Status: AC
  Filled 2013-09-17: qty 20

## 2013-09-17 MED ORDER — IOHEXOL 300 MG/ML  SOLN
50.0000 mL | Freq: Once | INTRAMUSCULAR | Status: AC | PRN
  Administered 2013-09-17: 50 mL via ORAL

## 2013-09-17 MED ORDER — LOSARTAN POTASSIUM 50 MG PO TABS
100.0000 mg | ORAL_TABLET | Freq: Every morning | ORAL | Status: DC
  Administered 2013-09-17 – 2013-09-18 (×2): 100 mg via ORAL
  Filled 2013-09-17 (×2): qty 2

## 2013-09-17 MED ORDER — GI COCKTAIL ~~LOC~~
30.0000 mL | Freq: Once | ORAL | Status: AC
  Administered 2013-09-17: 30 mL via ORAL
  Filled 2013-09-17: qty 30

## 2013-09-17 MED ORDER — SODIUM CHLORIDE 0.9 % IV SOLN
3.0000 g | Freq: Once | INTRAVENOUS | Status: DC
  Filled 2013-09-17: qty 3

## 2013-09-17 MED ORDER — OXYCODONE-ACETAMINOPHEN 10-325 MG PO TABS: 0.5000 | ORAL_TABLET | Freq: Three times a day (TID) | ORAL | Status: DC | PRN

## 2013-09-17 MED ORDER — 0.9 % SODIUM CHLORIDE (POUR BTL) OPTIME
TOPICAL | Status: DC | PRN
  Administered 2013-09-17: 1000 mL

## 2013-09-17 MED ORDER — OXYCODONE-ACETAMINOPHEN 5-325 MG PO TABS
1.0000 | ORAL_TABLET | ORAL | Status: DC | PRN
  Administered 2013-09-18: 1 via ORAL
  Filled 2013-09-17: qty 1

## 2013-09-17 MED ORDER — NEOSTIGMINE METHYLSULFATE 1 MG/ML IJ SOLN
INTRAMUSCULAR | Status: DC | PRN
  Administered 2013-09-17: 2 mg via INTRAVENOUS

## 2013-09-17 MED ORDER — CLOPIDOGREL BISULFATE 75 MG PO TABS
75.0000 mg | ORAL_TABLET | Freq: Every morning | ORAL | Status: DC
  Administered 2013-09-18: 75 mg via ORAL
  Filled 2013-09-17: qty 1

## 2013-09-17 MED ORDER — NEOSTIGMINE METHYLSULFATE 1 MG/ML IJ SOLN
INTRAMUSCULAR | Status: AC
  Filled 2013-09-17: qty 10

## 2013-09-17 MED ORDER — GLYCOPYRROLATE 0.2 MG/ML IJ SOLN
INTRAMUSCULAR | Status: DC | PRN
  Administered 2013-09-17: 0.4 mg via INTRAVENOUS

## 2013-09-17 MED ORDER — NITROGLYCERIN IN D5W 200-5 MCG/ML-% IV SOLN
5.0000 ug/min | INTRAVENOUS | Status: DC
  Administered 2013-09-17: 5 ug/min via INTRAVENOUS
  Filled 2013-09-17: qty 250

## 2013-09-17 MED ORDER — PANTOPRAZOLE SODIUM 40 MG IV SOLR
40.0000 mg | INTRAVENOUS | Status: DC
  Administered 2013-09-17: 40 mg via INTRAVENOUS
  Filled 2013-09-17 (×2): qty 40

## 2013-09-17 MED ORDER — LIDOCAINE HCL (CARDIAC) 20 MG/ML IV SOLN
INTRAVENOUS | Status: AC
  Filled 2013-09-17: qty 5

## 2013-09-17 MED ORDER — ONDANSETRON HCL 4 MG/2ML IJ SOLN
4.0000 mg | Freq: Once | INTRAMUSCULAR | Status: AC
  Administered 2013-09-17: 4 mg via INTRAVENOUS
  Filled 2013-09-17: qty 2

## 2013-09-17 MED ORDER — FENTANYL CITRATE 0.05 MG/ML IJ SOLN
INTRAMUSCULAR | Status: DC | PRN
  Administered 2013-09-17 (×2): 50 ug via INTRAVENOUS
  Administered 2013-09-17: 100 ug via INTRAVENOUS
  Administered 2013-09-17: 50 ug via INTRAVENOUS

## 2013-09-17 MED ORDER — ESMOLOL HCL 10 MG/ML IV SOLN
INTRAVENOUS | Status: DC | PRN
  Administered 2013-09-17: 30 mg via INTRAVENOUS
  Administered 2013-09-17: 40 mg via INTRAVENOUS

## 2013-09-17 MED ORDER — SUCCINYLCHOLINE CHLORIDE 20 MG/ML IJ SOLN
INTRAMUSCULAR | Status: AC
  Filled 2013-09-17: qty 1

## 2013-09-17 MED ORDER — ONDANSETRON HCL 4 MG/2ML IJ SOLN
4.0000 mg | Freq: Four times a day (QID) | INTRAMUSCULAR | Status: DC | PRN
  Administered 2013-09-17: 4 mg via INTRAVENOUS
  Filled 2013-09-17: qty 2

## 2013-09-17 MED ORDER — FENTANYL CITRATE 0.05 MG/ML IJ SOLN
INTRAMUSCULAR | Status: AC
  Filled 2013-09-17: qty 5

## 2013-09-17 MED ORDER — OXYCODONE HCL 5 MG PO TABS: 5.0000 mg | ORAL_TABLET | Freq: Three times a day (TID) | ORAL | Status: DC | PRN

## 2013-09-17 MED ORDER — BUPIVACAINE-EPINEPHRINE (PF) 0.5% -1:200000 IJ SOLN
INTRAMUSCULAR | Status: AC
  Filled 2013-09-17: qty 10

## 2013-09-17 MED ORDER — ATORVASTATIN CALCIUM 80 MG PO TABS
80.0000 mg | ORAL_TABLET | Freq: Every evening | ORAL | Status: DC
  Administered 2013-09-17: 80 mg via ORAL
  Filled 2013-09-17 (×2): qty 1

## 2013-09-17 MED ORDER — FENTANYL CITRATE 0.05 MG/ML IJ SOLN
25.0000 ug | INTRAMUSCULAR | Status: DC | PRN
  Administered 2013-09-17 (×2): 50 ug via INTRAVENOUS

## 2013-09-17 MED ORDER — HEPARIN SODIUM (PORCINE) 5000 UNIT/ML IJ SOLN
5000.0000 [IU] | Freq: Three times a day (TID) | INTRAMUSCULAR | Status: DC
  Administered 2013-09-17 – 2013-09-18 (×2): 5000 [IU] via SUBCUTANEOUS
  Filled 2013-09-17 (×5): qty 1

## 2013-09-17 MED ORDER — SODIUM CHLORIDE 0.9 % IV SOLN
INTRAVENOUS | Status: DC | PRN
  Administered 2013-09-17: 16:00:00 via INTRAVENOUS

## 2013-09-17 MED ORDER — BUPIVACAINE-EPINEPHRINE 0.5% -1:200000 IJ SOLN
INTRAMUSCULAR | Status: DC | PRN
  Administered 2013-09-17: 20 mL

## 2013-09-17 MED ORDER — SODIUM CHLORIDE 0.9 % IV SOLN
3.0000 g | Freq: Four times a day (QID) | INTRAVENOUS | Status: DC
  Administered 2013-09-17: 3 g via INTRAVENOUS
  Filled 2013-09-17 (×2): qty 3

## 2013-09-17 MED ORDER — OXYCODONE-ACETAMINOPHEN 5-325 MG PO TABS: 1.0000 | ORAL_TABLET | Freq: Three times a day (TID) | ORAL | Status: DC | PRN

## 2013-09-17 MED ORDER — VECURONIUM BROMIDE 10 MG IV SOLR
INTRAVENOUS | Status: DC | PRN
  Administered 2013-09-17: 3 mg via INTRAVENOUS

## 2013-09-17 MED ORDER — PROPOFOL 10 MG/ML IV BOLUS
INTRAVENOUS | Status: DC | PRN
  Administered 2013-09-17: 200 mg via INTRAVENOUS

## 2013-09-17 MED ORDER — METHOCARBAMOL 500 MG PO TABS
500.0000 mg | ORAL_TABLET | Freq: Three times a day (TID) | ORAL | Status: DC
  Administered 2013-09-17 – 2013-09-18 (×2): 500 mg via ORAL
  Filled 2013-09-17 (×4): qty 1

## 2013-09-17 MED ORDER — METOPROLOL TARTRATE 25 MG PO TABS
25.0000 mg | ORAL_TABLET | Freq: Two times a day (BID) | ORAL | Status: DC
  Administered 2013-09-17 – 2013-09-18 (×2): 25 mg via ORAL
  Filled 2013-09-17 (×3): qty 1

## 2013-09-17 MED ORDER — MORPHINE SULFATE 4 MG/ML IJ SOLN
4.0000 mg | INTRAMUSCULAR | Status: DC | PRN
  Administered 2013-09-17 – 2013-09-18 (×3): 4 mg via INTRAVENOUS
  Filled 2013-09-17 (×3): qty 1

## 2013-09-17 MED ORDER — IOHEXOL 300 MG/ML  SOLN
100.0000 mL | Freq: Once | INTRAMUSCULAR | Status: AC | PRN
  Administered 2013-09-17: 100 mL via INTRAVENOUS

## 2013-09-17 MED ORDER — GLYCOPYRROLATE 0.2 MG/ML IJ SOLN
INTRAMUSCULAR | Status: AC
  Filled 2013-09-17: qty 2

## 2013-09-17 SURGICAL SUPPLY — 38 items
APL SKNCLS STERI-STRIP NONHPOA (GAUZE/BANDAGES/DRESSINGS) ×1
APPLIER CLIP LOGIC TI 5 (MISCELLANEOUS) IMPLANT
APPLIER CLIP ROT 10 11.4 M/L (STAPLE)
APR CLP MED LRG 11.4X10 (STAPLE)
APR CLP MED LRG 33X5 (MISCELLANEOUS)
BAG SPEC RTRVL LRG 6X4 10 (ENDOMECHANICALS) ×1
BANDAGE ADHESIVE 1X3 (GAUZE/BANDAGES/DRESSINGS) ×5 IMPLANT
BENZOIN TINCTURE PRP APPL 2/3 (GAUZE/BANDAGES/DRESSINGS) ×3 IMPLANT
CANISTER SUCTION 2500CC (MISCELLANEOUS) ×3 IMPLANT
CHLORAPREP W/TINT 26ML (MISCELLANEOUS) ×3 IMPLANT
CLIP APPLIE ROT 10 11.4 M/L (STAPLE) IMPLANT
CLOSURE STERI-STRIP 1/2X4 (GAUZE/BANDAGES/DRESSINGS) ×1
CLSR STERI-STRIP ANTIMIC 1/2X4 (GAUZE/BANDAGES/DRESSINGS) ×1 IMPLANT
COVER SURGICAL LIGHT HANDLE (MISCELLANEOUS) ×3 IMPLANT
CUTTER LINEAR ENDO 35 ETS (STAPLE) ×2 IMPLANT
DRAPE UTILITY 15X26 W/TAPE STR (DRAPE) ×6 IMPLANT
ELECT REM PT RETURN 9FT ADLT (ELECTROSURGICAL) ×3
ELECTRODE REM PT RTRN 9FT ADLT (ELECTROSURGICAL) ×1 IMPLANT
ENDOLOOP SUT PDS II  0 18 (SUTURE)
ENDOLOOP SUT PDS II 0 18 (SUTURE) IMPLANT
GLOVE SURG SIGNA 7.5 PF LTX (GLOVE) ×3 IMPLANT
GOWN STRL NON-REIN LRG LVL3 (GOWN DISPOSABLE) ×3 IMPLANT
GOWN STRL REIN XL XLG (GOWN DISPOSABLE) ×3 IMPLANT
KIT BASIN OR (CUSTOM PROCEDURE TRAY) ×3 IMPLANT
KIT ROOM TURNOVER OR (KITS) ×3 IMPLANT
NS IRRIG 1000ML POUR BTL (IV SOLUTION) ×3 IMPLANT
PAD ARMBOARD 7.5X6 YLW CONV (MISCELLANEOUS) ×6 IMPLANT
POUCH SPECIMEN RETRIEVAL 10MM (ENDOMECHANICALS) ×3 IMPLANT
SCALPEL HARMONIC ACE (MISCELLANEOUS) ×3 IMPLANT
SET IRRIG TUBING LAPAROSCOPIC (IRRIGATION / IRRIGATOR) ×3 IMPLANT
SLEEVE ENDOPATH XCEL 5M (ENDOMECHANICALS) ×3 IMPLANT
SPECIMEN JAR SMALL (MISCELLANEOUS) ×3 IMPLANT
SUT MON AB 4-0 PC3 18 (SUTURE) ×3 IMPLANT
TOWEL OR 17X24 6PK STRL BLUE (TOWEL DISPOSABLE) ×3 IMPLANT
TOWEL OR 17X26 10 PK STRL BLUE (TOWEL DISPOSABLE) ×3 IMPLANT
TRAY LAPAROSCOPIC (CUSTOM PROCEDURE TRAY) ×3 IMPLANT
TROCAR XCEL BLUNT TIP 100MML (ENDOMECHANICALS) ×3 IMPLANT
TROCAR XCEL NON-BLD 5MMX100MML (ENDOMECHANICALS) ×3 IMPLANT

## 2013-09-17 NOTE — Transfer of Care (Signed)
Immediate Anesthesia Transfer of Care Note  Patient: [name]  Procedure(s) Performed: Procedure(s): APPENDECTOMY LAPAROSCOPIC (N/A)  Patient Location: PACU  Anesthesia Type:General  Level of Consciousness: awake and alert   Airway & Oxygen Therapy: Patient Spontanous Breathing and Patient connected to nasal cannula oxygen  Post-op Assessment: Report given to PACU RN and Post -op Vital signs reviewed and stable  Post vital signs: Reviewed and stable  Complications: No apparent anesthesia complications

## 2013-09-17 NOTE — Anesthesia Preprocedure Evaluation (Addendum)
Anesthesia Evaluation  Patient identified by MRN, date of birth, ID band Patient awake    Reviewed: Allergy & Precautions, H&P , NPO status , Patient's Chart, lab work & pertinent test results, reviewed documented beta blocker date and time   Airway Mallampati: II TM Distance: >3 FB Neck ROM: Full    Dental  (+) Teeth Intact, Dental Advisory Given   Pulmonary former smoker,          Cardiovascular hypertension, Pt. on medications and Pt. on home beta blockers + angina + CAD and + Past MI     Neuro/Psych    GI/Hepatic Neg liver ROS, GI history noted.   Endo/Other  negative endocrine ROS  Renal/GU negative Renal ROS     Musculoskeletal   Abdominal   Peds  Hematology   Anesthesia Other Findings   Reproductive/Obstetrics                          Anesthesia Physical Anesthesia Plan  ASA: II  Anesthesia Plan: General   Post-op Pain Management:    Induction: Intravenous  Airway Management Planned: Oral ETT  Additional Equipment:   Intra-op Plan:   Post-operative Plan: Extubation in OR  Informed Consent: I have reviewed the patients History and Physical, chart, labs and discussed the procedure including the risks, benefits and alternatives for the proposed anesthesia with the patient or authorized representative who has indicated his/her understanding and acceptance.   Dental advisory given  Plan Discussed with: CRNA and Anesthesiologist  Anesthesia Plan Comments:         Anesthesia Quick Evaluation

## 2013-09-17 NOTE — Anesthesia Procedure Notes (Signed)
Procedure Name: Intubation Date/Time: 09/17/2013 4:46 PM Performed by: Alanda AmassFRIEDMAN, Jiovani Mccammon A Pre-anesthesia Checklist: Patient identified, Timeout performed, Emergency Drugs available and Patient being monitored Patient Re-evaluated:Patient Re-evaluated prior to inductionOxygen Delivery Method: Circle system utilized Preoxygenation: Pre-oxygenation with 100% oxygen Intubation Type: IV induction, Rapid sequence and Cricoid Pressure applied Laryngoscope Size: Mac and 3 Grade View: Grade II Tube type: Oral Tube size: 7.5 mm Number of attempts: 1 Airway Equipment and Method: Stylet Placement Confirmation: ETT inserted through vocal cords under direct vision,  breath sounds checked- equal and bilateral and positive ETCO2 Secured at: 23 cm Tube secured with: Tape Dental Injury: Teeth and Oropharynx as per pre-operative assessment

## 2013-09-17 NOTE — Preoperative (Signed)
Beta Blockers   Reason not to administer Beta Blockers:taken by pt. 3/1 at 0800

## 2013-09-17 NOTE — ED Notes (Signed)
Pt returns from ct scan. 

## 2013-09-17 NOTE — H&P (Signed)
FMTS ATTENDING ADMISSION NOTE Kehinde Eniola,MD I  have seen and examined this patient, reviewed their chart. I have discussed this patient with the resident. I agree with the resident's findings, assessment and care plan.  38 Y/O male presented with left sided chest pain and right lower quadrant abdominal pain, his chest pain started few hrs prior to hospital admission, denies associated SOB,no cough,no fever. His abdominal pain started about 2 days ago on and off but worsened today,pain was dull in nature, he had one episode of N/V at home,no change in bowel habit. Patient feels much better now with his abdominal pain, his chest pain has completely resolved. He was seen s/p surgery.  Filed Vitals:   09/17/13 1745 09/17/13 1800 09/17/13 1815 09/17/13 1842  BP: 112/77 115/68  116/64  Pulse: 89 86    Temp:   98.5 F (36.9 C)   TempSrc:      Resp: 18 16 15    Height:    5\' 11"  (1.803 m)  Weight:    265 lb (120.203 kg)  SpO2: 91% 96%     Exam Gen: Calm in bed not in distress. HEENT: EOMI,PERRLA Resp: air entry equal and clear B/L CV: S1 S2 good,no murmur, RRR. Abd: Soft, mild tenderness mostly around surgical site. BS+,no abdominal distention. Ext: No edema.  A/P: 38 Y/O with 1. Abdominal Pain:    CT abdomen revealed appendicitis, he is s/p appendectomy.    He tolerated procedure well.    Pain control as needed.    Advance diet as tolerated.  2. Chest pain: Resolved     ACS unlikely.     Continue telemetry monitoring post op. 3: HTN: Home meds.

## 2013-09-17 NOTE — ED Notes (Signed)
Pt returns xray placed back on monitoring.

## 2013-09-17 NOTE — ED Provider Notes (Signed)
This was a shared encounter.  Patient presents with chest pain.  Notably, the patient had abdominal pain first, then developed chest pain.  With his history of CABG, initial evaluation focused on the patient's chest pain.  With the persistency of his abdominal pain he also had CT scanning of his abdomen.  This demonstrated appendicitis.  I have seen EKG, agree with the interpretation.  I have also seen the CT scan and also agree with that interpretation.  Patient was admitted for further E/M.    Gerhard Munchobert Janita Camberos, MD 09/17/13 [phone]

## 2013-09-17 NOTE — ED Notes (Signed)
[facility] with c/o RUQ pain that started yesterday evening and then went to left side of chest and down left arm sometime after midnight. Pt wife was going to drive pt to hospital and pt became nauseated while walking to the car and vomited x1 so they called EMS. Pt self administered Nitro x1 and then EMS administered Nitro x1. Pt refused 3rd nitro because it caused him to have a headache and became lightheaded after. No ASA administered because it causing stomach bleeding. Pt has hx of MI and bypass in the past. BP-162/96 HR-103 NSR to ST on the monitor. 12lead unremarkable.

## 2013-09-17 NOTE — H&P (Signed)
Family Medicine Teaching [facility] Admission History and Physical Service Pager: [phone]  Patient name: [name] Medical record number: [id number] Date of birth: 10/16/1975 Age: 38 y.o. Gender: male  Primary Care Provider: Maryjean Ka, MD Consultants: Cardiology Code Status: Full  Chief Complaint: chest pain  Assessment and Plan: [name] is a 38 y.o. male presenting with abdominal and chest pain. PMH is significant for prior CABG, Hypertension, Hyperlipidemia, obesity, and tobacco abuse.   Chest pain, ACS rule out: prior history of CABG, NSTEMI. ED workup including: Cmet wnl, WBC 13.6, normal lipase, negative troponin x 1, EKG unchanged, CXR with no acute disease.  - Admit to stepdown unit with chest pain requiring nitro drip - Continue nitro drip - consulted cardiology, appreciate recommendations - Cycle troponin X 2 more, repeat EKG in am - Risk stratification labs: TSH, A1C, Lipid panel - Continue statin, ASA, plavix, losartan, metoprolol, ranexa  - holding imdur and nitro as he is on nitro drip  Abdominal pain, with emesis X 2 - Possible dyspepsia, PUD, esophageal spasm, or referred cardiac pain - Abd exam with RUQ tenderness but still soft and no guarding - Labs not indicative of acute cholycystitis  - PRN GI Ciocktail for now, zofran PRN, start daily PPI - ED ordering CT abd currently, will follow for results  HLD - last lipid panel 11/2012, LDL 52, Trig 284 but previously 700+ - Continue fenofibrate and lipitor  Tobacco abuse - documented cessation, did not confirm - will encourage continued cessation  FEN/GI: NPO today pending possible need for intervention Prophylaxis: sq heparin  Disposition: SDU for chest pain   History of Present Illness: [name] is a 38 y.o. male presenting with chest pain starting last night. Pain started last night in his stomach around 9-10pm, located right side of abdomen. He last ate around 9pm  last night. Pain kept getting worse during the night, he developed chest pain and dyspnea around midnight which progressively worsened to the point of calling EMS this am. He was having difficulty walking around and laying down due to paina nd dyspnea (used 2 pillows overnight). He took nitro at home and it did not help with the pain.  Chest pain is rated as 4-5/10 currently, left sided chest, was initially stabbing but now a dull ache. CP radiated to left arm. SOB has resolved. His abdominal pain is largely resolved except with palpation.   NBNB Nausea and vomiting x 2. Denies fevers/chills, cough.   Cardiac history: had 2 MIs in past, in 2008 stents that "collapsed", CABG in 2009. Cardiologist is Dr. Sanjuana Kava  Review Of Systems: Per HPI, Otherwise 12 point review of systems was performed and was unremarkable.  Patient Active Problem List   Diagnosis Date Noted  . Chest pain 09/17/2013  . Myalgia and myositis, unspecified 05/29/2013  . Bilateral leg edema 02/21/2013  . Dental caries 01/19/2013  . Angina pectoris 12/07/2012  . Dizzy 12/07/2012  . Hypokalemia 12/07/2012  . Sciatica of right side 11/25/2012  . Intervertebral disk disease 07/27/2012  . Obesity, Class II, BMI 35-39.9 03/18/2012  . Anxiety 03/18/2012  . HTN (hypertension) 02/09/2012  . CAD (coronary artery disease) 10/29/2011  . CAD in native artery 10/20/2011  . HYPERLIPIDEMIA-MIXED 05/30/2008  . TOBACCO USE 05/30/2008  . CAD, AUTOLOGOUS BYPASS GRAFT 05/30/2008   Past Medical History: Past Medical History  Diagnosis Date  . Hypertension   . MI (myocardial infarction)   . Hx of CABG   . Heart attack   .  Bronchitis   . Tobacco user   . Hyperlipidemia   . MYOCARDIAL INFARCTION 10/13/2008    Qualifier: Diagnosis of  By: Juanda ChanceBrodie, MD, Johny ChessFACC, Bruce Rogers    Past Surgical History: Past Surgical History  Procedure Laterality Date  . Coronary artery bypass graft      status post diaphragmatic wall infarction Rx BMS  RCA 03-11-08   . Orthopedic surgery    . Coronary stent placement     Social History: History  Substance Use Topics  . Smoking status: Former Smoker -- 0.20 packs/day for 20 years    Types: Cigarettes    Quit date: 06/15/2013  . Smokeless tobacco: Never Used     Comment: E-Cigarette  . Alcohol Use: No     Comment: maybe 3-4 drinks a week, occasionally.   Additional social history: Please also refer to relevant sections of EMR.  Family History: Family History  Problem Relation Age of Onset  . Cancer Mother   . Cancer Sister   . Diabetes Mother   . Diabetes Sister   . Hyperlipidemia Mother   . Hypertension Mother   . Hyperlipidemia Sister   . Hypertension Sister   . Early death Mother   . Early death Sister    Allergies and Medications: Allergies  Allergen Reactions  . Codeine Itching  . Celexa [Citalopram] Itching and Rash   No current facility-administered medications on file prior to encounter.   Current Outpatient Prescriptions on File Prior to Encounter  Medication Sig Dispense Refill  . ALPRAZolam (XANAX) 0.5 MG tablet Take 1 tablet (0.5 mg total) by mouth 3 (three) times daily as needed for anxiety.  60 tablet  0  . atorvastatin (LIPITOR) 80 MG tablet Take 1 tablet (80 mg total) by mouth every evening.  30 tablet  3  . clopidogrel (PLAVIX) 75 MG tablet Take 1 tablet (75 mg total) by mouth every morning.  30 tablet  3  . fenofibrate (TRICOR) 48 MG tablet Take 1 tablet (48 mg total) by mouth every morning.  30 tablet  3  . isosorbide mononitrate (IMDUR) 30 MG 24 hr tablet Take 1 tablet (30 mg total) by mouth daily.  30 tablet  3  . losartan (COZAAR) 100 MG tablet Take 1 tablet (100 mg total) by mouth every morning.  30 tablet  3  . methocarbamol (ROBAXIN) 500 MG tablet Take 1 tablet (500 mg total) by mouth 3 (three) times daily.  90 tablet  1  . metoprolol tartrate (LOPRESSOR) 25 MG tablet Take 1 tablet (25 mg total) by mouth 2 (two) times daily.  60 tablet  3  .  Multiple Vitamin (MULITIVITAMIN WITH MINERALS) TABS Take 1 tablet by mouth every morning.       . nitroGLYCERIN (NITROSTAT) 0.4 MG SL tablet Place 1 tablet (0.4 mg total) under the tongue every 5 (five) minutes as needed for chest pain.  25 tablet  2  . oxyCODONE-acetaminophen (PERCOCET) 10-325 MG per tablet Take 0.5-1 tablets by mouth every 8 (eight) hours as needed for pain.  90 tablet  0  . Probiotic Product (PRO-BIOTIC BLEND) CAPS Take 1 capsule by mouth daily. Helps with digestion      . ranolazine (RANEXA) 500 MG 12 hr tablet Take 1 tablet (500 mg total) by mouth 2 (two) times daily.  180 tablet  3    Objective: BP 118/77  Pulse 91  Temp(Src) 97.9 F (36.6 C) (Oral)  Resp 17  Ht 5' 10.5" (1.791 m)  SpO2 95%  Exam: Gen: NAD, alert, cooperative with exam HEENT: NCAT, EOMI, PERRL, MMM CV: RRR, good S1/S2, no murmur Resp: good air movement very faint crackles @ Bl bases, non labored Abd: Soft, RUQ tenderness to palpation, + BS, no murphy's sign Ext: No edema, warm, 2+ DP pulses Neuro: Alert and oriented, No gross deficits Skin: several tatoos, no lesions apparent  Labs and Imaging: CBC BMET   Recent Labs Lab 09/17/13 0748  WBC 13.6*  HGB 15.5  HCT 44.1  PLT 243    Recent Labs Lab 09/17/13 0748  NA 141  K 4.0  CL 100  CO2 22  BUN 8  CREATININE 0.93  GLUCOSE 149*  CALCIUM 9.5       Recent Labs Lab 09/17/13 0748  TROPONINI <0.30    Recent Labs Lab 09/17/13 0748  AST 23  ALT 29  ALKPHOS 63  BILITOT 0.6  PROT 7.8  ALBUMIN 4.2   EKG 09/17/2013 : old inferior MI with unchanged q waves, stable atrial enlargement  CXR 09/17/2013 IMPRESSION:  No acute disease post CABG   Elenora Gamma, MD 09/17/2013, 10:56 AM PGY-2, [facility] FPTS Intern pager: [phone], text pages welcome

## 2013-09-17 NOTE — Consult Note (Signed)
Reason for Consult:appendicitis Referring Physician: [name] is an 38 y.o. male.  HPI: This is a gentleman who presented to the emergency department with chest pain. He asked he developed abdominal pain last evening with nausea and vomiting. He then became upset and developed chest pain. Given his prior cardiac history who presented to the emergency department.  During his workup, he continued to have intermittent right lower quadrant abdominal pain which was sharp prompting a CAT scan. I was consulted after the CAT scan demonstrated findings consistent with acute appendicitis.  The patient currently is having sharp pain in the right lower quadrant it has referred to the left upper quadrant. He now has no chest pain or shortness of breath.  Past Medical History  Diagnosis Date  . Hypertension   . MI (myocardial infarction)   . Hx of CABG   . Heart attack   . Bronchitis   . Tobacco user   . Hyperlipidemia   . MYOCARDIAL INFARCTION 10/13/2008    Qualifier: Diagnosis of  By: Olevia Perches, MD, Glenetta Hew     Past Surgical History  Procedure Laterality Date  . Coronary artery bypass graft      status post diaphragmatic wall infarction Rx BMS RCA 03-11-08   . Orthopedic surgery    . Coronary stent placement      Family History  Problem Relation Age of Onset  . Cancer Mother   . Cancer Sister   . Diabetes Mother   . Diabetes Sister   . Hyperlipidemia Mother   . Hypertension Mother   . Hyperlipidemia Sister   . Hypertension Sister   . Early death Mother   . Early death Sister     Social History:  reports that he quit smoking about 3 months ago. His smoking use included Cigarettes. He has a 4 pack-year smoking history. He has never used smokeless tobacco. He reports that he does not drink alcohol or use illicit drugs.  Allergies:  Allergies  Allergen Reactions  . Codeine Itching  . Celexa [Citalopram] Itching and Rash    Medications: I have  reviewed the patient's current medications.  Results for orders placed during the hospital encounter of 09/17/13 (from the past 48 hour(s))  COMPREHENSIVE METABOLIC PANEL     Status: Abnormal   Collection Time    09/17/13  7:48 AM      Result Value Ref Range   Sodium 141  137 - 147 mEq/L   Potassium 4.0  3.7 - 5.3 mEq/L   Chloride 100  96 - 112 mEq/L   CO2 22  19 - 32 mEq/L   Glucose, Bld 149 (*) 70 - 99 mg/dL   BUN 8  6 - 23 mg/dL   Creatinine, Ser 0.93  0.50 - 1.35 mg/dL   Calcium 9.5  8.4 - 10.5 mg/dL   Total Protein 7.8  6.0 - 8.3 g/dL   Albumin 4.2  3.5 - 5.2 g/dL   AST 23  0 - 37 U/L   Comment: HEMOLYSIS AT THIS LEVEL MAY AFFECT RESULT   ALT 29  0 - 53 U/L   Alkaline Phosphatase 63  39 - 117 U/L   Total Bilirubin 0.6  0.3 - 1.2 mg/dL   GFR calc non Af Amer >90  >90 mL/min   GFR calc Af Amer >90  >90 mL/min   Comment: (NOTE)     The eGFR has been calculated using the CKD EPI equation.  This calculation has not been validated in all clinical situations.     eGFR's persistently <90 mL/min signify possible Chronic Kidney     Disease.  LIPASE, BLOOD     Status: None   Collection Time    09/17/13  7:48 AM      Result Value Ref Range   Lipase 29  11 - 59 U/L  CBC WITH DIFFERENTIAL     Status: Abnormal   Collection Time    09/17/13  7:48 AM      Result Value Ref Range   WBC 13.6 (*) 4.0 - 10.5 K/uL   RBC 4.82  4.22 - 5.81 MIL/uL   Hemoglobin 15.5  13.0 - 17.0 g/dL   HCT 44.1  39.0 - 52.0 %   MCV 91.5  78.0 - 100.0 fL   MCH 32.2  26.0 - 34.0 pg   MCHC 35.1  30.0 - 36.0 g/dL   RDW 13.7  11.5 - 15.5 %   Platelets 243  150 - 400 K/uL   Neutrophils Relative % 85 (*) 43 - 77 %   Neutro Abs 11.6 (*) 1.7 - 7.7 K/uL   Lymphocytes Relative 10 (*) 12 - 46 %   Lymphs Abs 1.4  0.7 - 4.0 K/uL   Monocytes Relative 4  3 - 12 %   Monocytes Absolute 0.6  0.1 - 1.0 K/uL   Eosinophils Relative 0  0 - 5 %   Eosinophils Absolute 0.1  0.0 - 0.7 K/uL   Basophils Relative 0  0 - 1 %    Basophils Absolute 0.0  0.0 - 0.1 K/uL  TROPONIN I     Status: None   Collection Time    09/17/13  7:48 AM      Result Value Ref Range   Troponin I <0.30  <0.30 ng/mL   Comment:            Due to the release kinetics of cTnI,     a negative result within the first hours     of the onset of symptoms does not rule out     myocardial infarction with certainty.     If myocardial infarction is still suspected,     repeat the test at appropriate intervals.  URINALYSIS, ROUTINE W REFLEX MICROSCOPIC     Status: Abnormal   Collection Time    09/17/13 10:16 AM      Result Value Ref Range   Color, Urine AMBER (*) YELLOW   Comment: BIOCHEMICALS MAY BE AFFECTED BY COLOR   APPearance CLEAR  CLEAR   Specific Gravity, Urine 1.029  1.005 - 1.030   pH 6.0  5.0 - 8.0   Glucose, UA NEGATIVE  NEGATIVE mg/dL   Hgb urine dipstick NEGATIVE  NEGATIVE   Bilirubin Urine NEGATIVE  NEGATIVE   Ketones, ur 15 (*) NEGATIVE mg/dL   Protein, ur 100 (*) NEGATIVE mg/dL   Urobilinogen, UA 1.0  0.0 - 1.0 mg/dL   Nitrite NEGATIVE  NEGATIVE   Leukocytes, UA NEGATIVE  NEGATIVE  URINE MICROSCOPIC-ADD ON     Status: None   Collection Time    09/17/13 10:16 AM      Result Value Ref Range   Squamous Epithelial / LPF RARE  RARE   WBC, UA 0-2  <3 WBC/hpf   RBC / HPF 0-2  <3 RBC/hpf   Urine-Other MUCOUS PRESENT    D-DIMER, QUANTITATIVE     Status: None   Collection Time    09/17/13  11:36 AM      Result Value Ref Range   D-Dimer, Quant <0.27  0.00 - 0.48 ug/mL-FEU   Comment:            AT THE INHOUSE ESTABLISHED CUTOFF     VALUE OF 0.48 ug/mL FEU,     THIS ASSAY HAS BEEN DOCUMENTED     IN THE LITERATURE TO HAVE     A SENSITIVITY AND NEGATIVE     PREDICTIVE VALUE OF AT LEAST     98 TO 99%.  THE TEST RESULT     SHOULD BE CORRELATED WITH     AN ASSESSMENT OF THE CLINICAL     PROBABILITY OF DVT / VTE.  LIPID PANEL     Status: Abnormal   Collection Time    09/17/13 12:00 PM      Result Value Ref Range    Cholesterol 206 (*) 0 - 200 mg/dL   Triglycerides 436 (*) <150 mg/dL   HDL 35 (*) >39 mg/dL   Total CHOL/HDL Ratio 5.9     VLDL UNABLE TO CALCULATE IF TRIGLYCERIDE OVER 400 mg/dL  0 - 40 mg/dL   LDL Cholesterol UNABLE TO CALCULATE IF TRIGLYCERIDE OVER 400 mg/dL  0 - 99 mg/dL   Comment:            Total Cholesterol/HDL:CHD Risk     Coronary Heart Disease Risk Table                         Men   Women      1/2 Average Risk   3.4   3.3      Average Risk       5.0   4.4      2 X Average Risk   9.6   7.1      3 X Average Risk  23.4   11.0                Use the calculated Patient Ratio     above and the CHD Risk Table     to determine the patient's CHD Risk.                ATP III CLASSIFICATION (LDL):      <100     mg/dL   Optimal      100-129  mg/dL   Near or Above                        Optimal      130-159  mg/dL   Borderline      160-189  mg/dL   High      >190     mg/dL   Very High  TROPONIN I     Status: None   Collection Time    09/17/13  2:17 PM      Result Value Ref Range   Troponin I <0.30  <0.30 ng/mL   Comment:            Due to the release kinetics of cTnI,     a negative result within the first hours     of the onset of symptoms does not rule out     myocardial infarction with certainty.     If myocardial infarction is still suspected,     repeat the test at appropriate intervals.    Dg Chest 2 View  09/17/2013   CLINICAL DATA:  Left  chest pain  EXAM: CHEST - 2 VIEW  COMPARISON:  12/05/2012  FINDINGS: Lungs are clear. Heart size and mediastinal contours are within normal limits. No effusion. Previous CABG.  IMPRESSION: No acute disease post CABG   Electronically Signed   By: Arne Cleveland M.D.   On: 09/17/2013 08:29   Ct Abdomen Pelvis W Contrast  09/17/2013   CLINICAL DATA:  Right lower abdominal pain  EXAM: CT ABDOMEN AND PELVIS WITH CONTRAST  TECHNIQUE: Multidetector CT imaging of the abdomen and pelvis was performed using the standard protocol following bolus  administration of intravenous contrast.  CONTRAST:  116m OMNIPAQUE IOHEXOL 300 MG/ML  SOLN  COMPARISON:  01/02/2011  FINDINGS: Previous median sternotomy. Subpleural scar or subsegmental atelectasis posteriorly in the visualized lung bases. Unremarkable liver, nondilated gallbladder, spleen, adrenal glands, pancreas. Bilateral nephrolithiasis, largest stone a 4 mm calculus in the lower pole right renal collecting system. Renal parenchyma unremarkable. No hydronephrosis or ureterectasis. No ureteral calculus. Urinary bladder is incompletely distended.  Stomach, small bowel, and colon are nondilated. There are inflammatory/ edematous changes around the appendix which is dilated up to 10 mm diameter and retrocecal. No evidence perforation or abscess. Bilateral pelvic vascular calcifications. No ascites. No free air. Patchy aortoiliac arterial calcifications without aneurysm.  IMPRESSION: 1. Probable early acute appendicitis without perforation or abscess. Critical Value/emergent results were called by telephone at the time of interpretation on 09/17/2013 at 1:56 PM to Dr. VGlendell Docker, who verbally acknowledged these results. 2. Bilateral nephrolithiasis.   Electronically Signed   By: DArne ClevelandM.D.   On: 09/17/2013 13:57    Review of Systems  All other systems reviewed and are negative.   Blood pressure 108/62, pulse 92, temperature 97.9 F (36.6 C), temperature source Oral, resp. rate 22, height 5' 10.5" (1.791 m), SpO2 95.00%. Physical Exam  Constitutional: He is oriented to person, place, and time. He appears well-developed. No distress.  obese  HENT:  Head: Normocephalic and atraumatic.  Right Ear: External ear normal.  Left Ear: External ear normal.  Nose: Nose normal.  Mouth/Throat: Oropharynx is clear and moist. No oropharyngeal exudate.  Eyes: Conjunctivae are normal. Pupils are equal, round, and reactive to light. Right eye exhibits no discharge. No scleral icterus.  Neck: Normal  range of motion. Neck supple. No tracheal deviation present.  Cardiovascular: Normal rate, regular rhythm, normal heart sounds and intact distal pulses.   Respiratory: Effort normal and breath sounds normal. No respiratory distress. He has no wheezes.  GI: Soft. There is tenderness. There is guarding.  There is tenderness with guarding in the right lower quadrant  Musculoskeletal: Normal range of motion. He exhibits no edema and no tenderness.  Neurological: He is alert and oriented to person, place, and time.  Skin: Skin is warm and dry. No rash noted. He is not diaphoretic. No erythema.  Psychiatric: His behavior is normal. Judgment normal.    Assessment/Plan: Acute appendicitis  I am planning on proceeding to the operating room for a laparoscopic appendectomy unless cardiology opposes this plan. I discussed the risk of surgery with the patient. These risks includes but is not limited to bleeding especially given his on Plavix, infection, injury to surrounding structures, the need to convert to an open procedure, the risk of cardiac ovary problems including MI, DVT, etc. He understands and wishes to proceed. IV antibiotics will be given preoperatively  Cire Deyarmin A 09/17/2013, 3:39 PM

## 2013-09-17 NOTE — Op Note (Signed)
Appendectomy, Lap, Procedure Note  Indications: The patient presented with a history of right-sided abdominal pain. A CT revealed findings consistent with acute appendicitis.  Pre-operative Diagnosis: Acute appendicitis without mention of peritonitis  Post-operative Diagnosis: Same  Surgeon: Abigail MiyamotoBLACKMAN,Abdiel Blackerby A   Assistants: 0  Anesthesia: General endotracheal anesthesia  ASA Class: 3  Procedure Details  The patient was seen again in the Holding Room. The risks, benefits, complications, treatment options, and expected outcomes were discussed with the patient and/or family. The possibilities of reaction to medication, perforation of viscus, bleeding, recurrent infection, finding a normal appendix, the need for additional procedures, failure to diagnose a condition, and creating a complication requiring transfusion or operation were discussed. There was concurrence with the proposed plan and informed consent was obtained. The site of surgery was properly noted. The patient was taken to Operating Room, identified as [name] and the procedure verified as Appendectomy. A Time Out was held and the above information confirmed.  The patient was placed in the supine position and general anesthesia was induced, along with placement of orogastric tube, Venodyne boots, and a Foley catheter. The abdomen was prepped and draped in a sterile fashion. A one centimeter infraumbilical incision was made.  The umbilical stalk was elevated, and the midline fascia was incised with a #11 blade.  A Kelly clamp was used to confirm entrance into the peritoneal cavity.  A pursestring suture was passed around the incision with a 0 Vicryl.  The Hasson was introduced into the abdomen and the tails of the suture were used to hold the Hasson in place.   The pneumoperitoneum was then established to steady pressure of 15 mmHg.  Additional 5 mm cannulas then placed in the left lower quadrant of the abdomen and the  suprapubic region under direct visualization. A careful evaluation of the entire abdomen was carried out. The patient was placed in Trendelenburg and left lateral decubitus position. The small intestines were retracted in the cephalad and left lateral direction away from the pelvis and right lower quadrant. The patient was found to have an enlarged and inflamed appendix that was extending into the pelvis. There was no evidence of perforation.  The appendix was carefully dissected. The appendix was was skeletonized with the harmonic scalpel.   The appendix was divided at its base using an endo-GIA stapler. Minimal appendiceal stump was left in place. There was no evidence of bleeding, leakage, or complication after division of the appendix. Irrigation was also performed and irrigate suctioned from the abdomen as well.  The umbilical port site was closed with the purse string suture. There was no residual palpable fascial defect.  The trocar site skin wounds were closed with 4-0 Monocryl.  Instrument, sponge, and needle counts were correct at the conclusion of the case.   Findings: The appendix was found to be inflamed. There were not signs of necrosis.  There was not perforation. There was not abscess formation.  Estimated Blood Loss:  Minimal                 Complications:  None; patient tolerated the procedure well.         Disposition: PACU - hemodynamically stable.         Condition: stable

## 2013-09-17 NOTE — ED Notes (Signed)
Pt CP is gone but continues to c/o right lower quadrant pain. ED PA notified.

## 2013-09-17 NOTE — ED Provider Notes (Signed)
CSN: [id number]     Arrival date & time 09/17/13  0716 History   First MD Initiated Contact with Patient 09/17/13 0719     Chief Complaint  Patient presents with  . Chest Pain     (Consider location/radiation/quality/duration/timing/severity/associated sxs/prior Treatment) HPI Comments: Pt with a history of CABG and nstemi, comes in with complaints of abdominal pain and chest pain that started about 7 hours ago.the pain was initially in his abdomen and then moved to his chest which is not consistent with cp in the past. Has had vomiting, no fever or cough. He is having left sided pain that doesn't make anything better or worse, some tingling in the left arm with some sob. Has taken 2 nitro with some relief although the pain continues in his chest. Denies diarrhea  The history is provided by the patient. No language interpreter was used.    Past Medical History  Diagnosis Date  . Hypertension   . MI (myocardial infarction)   . Hx of CABG   . Heart attack   . Bronchitis   . Tobacco user   . Hyperlipidemia   . MYOCARDIAL INFARCTION 10/13/2008    Qualifier: Diagnosis of  By: Juanda Chance, MD, Johny Chess    Past Surgical History  Procedure Laterality Date  . Coronary artery bypass graft      status post diaphragmatic wall infarction Rx BMS RCA 03-11-08   . Orthopedic surgery    . Coronary stent placement     Family History  Problem Relation Age of Onset  . Cancer Mother   . Cancer Sister   . Diabetes Mother   . Diabetes Sister   . Hyperlipidemia Mother   . Hypertension Mother   . Hyperlipidemia Sister   . Hypertension Sister   . Early death Mother   . Early death Sister    History  Substance Use Topics  . Smoking status: Former Smoker -- 0.20 packs/day for 20 years    Types: Cigarettes    Quit date: 06/15/2013  . Smokeless tobacco: Never Used     Comment: E-Cigarette  . Alcohol Use: No     Comment: maybe two beers per month    Review of Systems    Allergies   Codeine and Celexa  Home Medications   Current Outpatient Rx  Name  Route  Sig  Dispense  Refill  . ALPRAZolam (XANAX) 0.5 MG tablet   Oral   Take 1 tablet (0.5 mg total) by mouth 3 (three) times daily as needed for anxiety.   60 tablet   0   . atorvastatin (LIPITOR) 80 MG tablet   Oral   Take 1 tablet (80 mg total) by mouth every evening.   30 tablet   3   . clopidogrel (PLAVIX) 75 MG tablet   Oral   Take 1 tablet (75 mg total) by mouth every morning.   30 tablet   3   . fenofibrate (TRICOR) 48 MG tablet   Oral   Take 1 tablet (48 mg total) by mouth every morning.   30 tablet   3   . isosorbide mononitrate (IMDUR) 30 MG 24 hr tablet   Oral   Take 1 tablet (30 mg total) by mouth daily.   30 tablet   3   . losartan (COZAAR) 100 MG tablet   Oral   Take 0.5 tablets (50 mg total) by mouth 2 (two) times daily.   30 tablet   3   .  losartan (COZAAR) 100 MG tablet   Oral   Take 1 tablet (100 mg total) by mouth every morning.   30 tablet   3   . methocarbamol (ROBAXIN) 500 MG tablet   Oral   Take 1 tablet (500 mg total) by mouth 3 (three) times daily.   90 tablet   1   . metoprolol tartrate (LOPRESSOR) 25 MG tablet   Oral   Take 1 tablet (25 mg total) by mouth 2 (two) times daily.   60 tablet   3   . Multiple Vitamin (MULITIVITAMIN WITH MINERALS) TABS   Oral   Take 1 tablet by mouth every morning.          . nitroGLYCERIN (NITROSTAT) 0.4 MG SL tablet   Sublingual   Place 1 tablet (0.4 mg total) under the tongue every 5 (five) minutes as needed for chest pain.   25 tablet   2   . oxyCODONE-acetaminophen (PERCOCET) 10-325 MG per tablet   Oral   Take 0.5-1 tablets by mouth every 8 (eight) hours as needed for pain.   90 tablet   0   . Probiotic Product (PRO-BIOTIC BLEND) CAPS   Oral   Take 1 capsule by mouth daily. Helps with digestion         . ranolazine (RANEXA) 500 MG 12 hr tablet   Oral   Take 1 tablet (500 mg total) by mouth 2 (two)  times daily.   180 tablet   3    BP 144/88  Pulse 99  Temp(Src) 97.9 F (36.6 C) (Oral)  Resp 22  SpO2 96% Physical Exam  ED Course  CRITICAL CARE Performed by: Teressa LowerPICKERING, Renika Shiflet Authorized by: Teressa LowerPICKERING, Olon Russ Total critical care time: 30 minutes Critical care was necessary to treat or prevent imminent or life-threatening deterioration of the following conditions: cardiac failure. Critical care was time spent personally by me on the following activities: development of treatment plan with patient or surrogate, discussions with consultants, evaluation of patient's response to treatment, examination of patient, obtaining history from patient or surrogate, ordering and performing treatments and interventions, ordering and review of laboratory studies, ordering and review of radiographic studies, pulse oximetry, re-evaluation of patient's condition and review of old charts. Subsequent provider of critical care: I assumed direction of critical care for this patient from another provider of my specialty.   (including critical care time) Labs Review Labs Reviewed  COMPREHENSIVE METABOLIC PANEL - Abnormal; Notable for the following:    Glucose, Bld 149 (*)    All other components within normal limits  CBC WITH DIFFERENTIAL - Abnormal; Notable for the following:    WBC 13.6 (*)    Neutrophils Relative % 85 (*)    Neutro Abs 11.6 (*)    Lymphocytes Relative 10 (*)    All other components within normal limits  LIPASE, BLOOD  TROPONIN I  URINALYSIS, ROUTINE W REFLEX MICROSCOPIC   Imaging Review Dg Chest 2 View  09/17/2013   CLINICAL DATA:  Left chest pain  EXAM: CHEST - 2 VIEW  COMPARISON:  12/05/2012  FINDINGS: Lungs are clear. Heart size and mediastinal contours are within normal limits. No effusion. Previous CABG.  IMPRESSION: No acute disease post CABG   Electronically Signed   By: Oley Balmaniel  Hassell M.D.   On: 09/17/2013 08:29     EKG Interpretation   Date/Time:  Sunday September 17 2013 07:24:11 EST Ventricular Rate:  95 PR Interval:  178 QRS Duration: 92 QT Interval:  348  QTC Calculation: 437 R Axis:   21 Text Interpretation:  Sinus rhythm Probable left atrial enlargement Sinus  rhythm possible atrial enlargement Borderline ECG Confirmed by Gerhard Munch  MD ([id number]) on 09/17/2013 7:33:27 AM      MDM   Final diagnoses:  Chest pain  Abdominal pain  Vomiting    Pt continues to have pain although decreasing with nitro. Pt is not having abdominal pain at this time although that can be followed while pt is admitted. Pt has not had more vomiting here. Think pt needs to be admitted for cp rule out related to history  11:01 AM Called into the room for recurrence of abdominal pain. Pt very tender in the rlq will scan 3:06 PM Dr. Magnus Ivan notified of appendicitis  Teressa Lower, NP 09/17/13 [id number]  Teressa Lower, NP 09/17/13 5409  Teressa Lower, NP 09/17/13 1102  Teressa Lower, NP 09/17/13 1506

## 2013-09-17 NOTE — Anesthesia Postprocedure Evaluation (Signed)
  Anesthesia Post-op Note  Patient: [name]  Procedure(s) Performed: Procedure(s): APPENDECTOMY LAPAROSCOPIC (N/A)  Patient Location: PACU  Anesthesia Type:General  Level of Consciousness: awake  Airway and Oxygen Therapy: Patient Spontanous Breathing  Post-op Pain: mild  Post-op Assessment: Post-op Vital signs reviewed  Post-op Vital Signs: Reviewed  Complications: No apparent anesthesia complications

## 2013-09-17 NOTE — Consult Note (Signed)
Reason for Consult: Chest pain  Requesting Physician: Family Medicine  Primary Cardiologist: Dr. Lauree Chandler  HPI: This is a 38 y.o. male with a past medical history significant for CAD, CABG x 4 in Oct 2009 with L-LAD, S-Dx, S-OM3/RI.  PCI with stent to OM3 was done in March 2010. His last cath was April 2013- plan was for medical Rx. Cath at that time showed a patent LIMA-LAD and occluded SVG-OM3/RI (old). The previously placed OM3 stent was patent. He was last admitted May 2014 with chest pain. He saw Dr Julianne Handler a few weeks ago and was doing well. He has problems with back but that was better after an injection.             Last nigh around 9 pm he developed Rt lower quadrant abdominal pain that radiated to his Lt chest. This was associated with pleuritic SOB this am and he came to the ER. He took NTG at home without relief.   PMHx:  Past Medical History  Diagnosis Date  . Hypertension   . MI (myocardial infarction)   . Hx of CABG   . Heart attack   . Bronchitis   . Tobacco user   . Hyperlipidemia   . MYOCARDIAL INFARCTION 10/13/2008    Qualifier: Diagnosis of  By: Olevia Perches, MD, Glenetta Hew    Past Surgical History  Procedure Laterality Date  . Coronary artery bypass graft      status post diaphragmatic wall infarction Rx BMS RCA 03-11-08   . Orthopedic surgery    . Coronary stent placement      FAMHx: Remarkable for cancer   SOCHx:  reports that he quit smoking about 3 months ago. His smoking use included Cigarettes. He has a 4 pack-year smoking history. He has never used smokeless tobacco. He reports that he does not drink alcohol or use illicit drugs.  ALLERGIES: Allergies  Allergen Reactions  . Codeine Itching  . Celexa [Citalopram] Itching and Rash    ROS: A comprehensive review of systems was negative except for: chest pain as described. He denies hemoptysis, blood clots, recent fever or chills.   HOME MEDICATIONS:  (Not in a hospital  admission)  HOSPITAL MEDICATIONS: I have reviewed the patient's current medications.  VITALS: Blood pressure 126/68, pulse 85, temperature 97.9 F (36.6 C), temperature source Oral, resp. rate 13, height 5' 10.5" (1.791 m), SpO2 96.00%.  PHYSICAL EXAM: General appearance: alert, cooperative and no distress Neck: no carotid bruit and no JVD Lungs: clear to auscultation bilaterally Heart: regular rate and rhythm Abdomen: soft, non-tender; bowel sounds normal; no masses,  no organomegaly Extremities: no edema Pulses: 2+ and symmetric Skin: Multiple tatoos Neurologic: Grossly normal  LABS: Results for orders placed during the hospital encounter of 09/17/13 (from the past 48 hour(s))  COMPREHENSIVE METABOLIC PANEL     Status: Abnormal   Collection Time    09/17/13  7:48 AM      Result Value Ref Range   Sodium 141  137 - 147 mEq/L   Potassium 4.0  3.7 - 5.3 mEq/L   Chloride 100  96 - 112 mEq/L   CO2 22  19 - 32 mEq/L   Glucose, Bld 149 (*) 70 - 99 mg/dL   BUN 8  6 - 23 mg/dL   Creatinine, Ser 0.93  0.50 - 1.35 mg/dL   Calcium 9.5  8.4 - 10.5 mg/dL   Total Protein 7.8  6.0 - 8.3 g/dL   Albumin 4.2  3.5 -  5.2 g/dL   AST 23  0 - 37 U/L   Comment: HEMOLYSIS AT THIS LEVEL MAY AFFECT RESULT   ALT 29  0 - 53 U/L   Alkaline Phosphatase 63  39 - 117 U/L   Total Bilirubin 0.6  0.3 - 1.2 mg/dL   GFR calc non Af Amer >90  >90 mL/min   GFR calc Af Amer >90  >90 mL/min   Comment: (NOTE)     The eGFR has been calculated using the CKD EPI equation.     This calculation has not been validated in all clinical situations.     eGFR's persistently <90 mL/min signify possible Chronic Kidney     Disease.  LIPASE, BLOOD     Status: None   Collection Time    09/17/13  7:48 AM      Result Value Ref Range   Lipase 29  11 - 59 U/L  CBC WITH DIFFERENTIAL     Status: Abnormal   Collection Time    09/17/13  7:48 AM      Result Value Ref Range   WBC 13.6 (*) 4.0 - 10.5 K/uL   RBC 4.82  4.22 -  5.81 MIL/uL   Hemoglobin 15.5  13.0 - 17.0 g/dL   HCT 44.1  39.0 - 52.0 %   MCV 91.5  78.0 - 100.0 fL   MCH 32.2  26.0 - 34.0 pg   MCHC 35.1  30.0 - 36.0 g/dL   RDW 13.7  11.5 - 15.5 %   Platelets 243  150 - 400 K/uL   Neutrophils Relative % 85 (*) 43 - 77 %   Neutro Abs 11.6 (*) 1.7 - 7.7 K/uL   Lymphocytes Relative 10 (*) 12 - 46 %   Lymphs Abs 1.4  0.7 - 4.0 K/uL   Monocytes Relative 4  3 - 12 %   Monocytes Absolute 0.6  0.1 - 1.0 K/uL   Eosinophils Relative 0  0 - 5 %   Eosinophils Absolute 0.1  0.0 - 0.7 K/uL   Basophils Relative 0  0 - 1 %   Basophils Absolute 0.0  0.0 - 0.1 K/uL  TROPONIN I     Status: None   Collection Time    09/17/13  7:48 AM      Result Value Ref Range   Troponin I <0.30  <0.30 ng/mL   Comment:            Due to the release kinetics of cTnI,     a negative result within the first hours     of the onset of symptoms does not rule out     myocardial infarction with certainty.     If myocardial infarction is still suspected,     repeat the test at appropriate intervals.  URINALYSIS, ROUTINE W REFLEX MICROSCOPIC     Status: Abnormal   Collection Time    09/17/13 10:16 AM      Result Value Ref Range   Color, Urine AMBER (*) YELLOW   Comment: BIOCHEMICALS MAY BE AFFECTED BY COLOR   APPearance CLEAR  CLEAR   Specific Gravity, Urine 1.029  1.005 - 1.030   pH 6.0  5.0 - 8.0   Glucose, UA NEGATIVE  NEGATIVE mg/dL   Hgb urine dipstick NEGATIVE  NEGATIVE   Bilirubin Urine NEGATIVE  NEGATIVE   Ketones, ur 15 (*) NEGATIVE mg/dL   Protein, ur 100 (*) NEGATIVE mg/dL   Urobilinogen, UA 1.0  0.0 - 1.0  mg/dL   Nitrite NEGATIVE  NEGATIVE   Leukocytes, UA NEGATIVE  NEGATIVE  URINE MICROSCOPIC-ADD ON     Status: None   Collection Time    09/17/13 10:16 AM      Result Value Ref Range   Squamous Epithelial / LPF RARE  RARE   WBC, UA 0-2  <3 WBC/hpf   RBC / HPF 0-2  <3 RBC/hpf   Urine-Other MUCOUS PRESENT      EKG: NSR without acute changes  IMAGING: Dg  Chest 2 View  09/17/2013   CLINICAL DATA:  Left chest pain  EXAM: CHEST - 2 VIEW  COMPARISON:  12/05/2012  FINDINGS: Lungs are clear. Heart size and mediastinal contours are within normal limits. No effusion. Previous CABG.  IMPRESSION: No acute disease post CABG   Electronically Signed   By: Arne Cleveland M.D.   On: 09/17/2013 08:29    IMPRESSION: Principal Problem:   Chest pain with moderate risk of acute coronary syndrome Active Problems:   CAD - CABG '09, OM3 stent March 2010, last cath 4/13- medical Rx   HTN (hypertension)   HYPERLIPIDEMIA-MIXED   Anxiety   Sciatica of right side   RECOMMENDATION: MD to see. Check D-dimer. We will follow. His symptoms sound atypical for angina.   Erlene Quan [phone] beeper 09/17/2013, 11:54 AM    Attending note:  Patient seen and examined. Reviewed records and discussed the case with Mr. Reino Bellis. Mr. Uzzle is followed by Dr. Angelena Form with history of multivessel CAD status post CABG with known graft disease and interval circumflex PCI. He has been managed medically and was doing well at his most recent office visit in January. He is now being admitted with chest pain symptoms. He states that last evening around 9 or 10 PM he developed a right lower quadrant discomfort. This persisted and he became "angry" and "upset" ultimately experiencing tightness in his chest. Chest discomfort was worse by this morning and also associated with shortness of breath. He presented for further assessment.  He now appears comfortable on my examination, states that symptoms are much better. He is on IV nitroglycerin. Chest x-ray reports no acute findings, and ECG shows sinus rhythm with no acute ST segment changes. Initial troponin I less than 0.30. CT scan of the abdomen is pending.  Would obtain set of cardiac markers, followup ECG in the morning. Somewhat pleuritic quality to chest pain, although doubt pulmonary embolus at this time. Could get a d-dimer,  although this may result in further testing. Our service will follow with you, and we can determine whether additional ischemic workup is necessary.  Satira Sark, M.D., F.A.C.C.

## 2013-09-17 NOTE — Progress Notes (Signed)
Interim note  Saw patient post op. He appears well and is comfortable. He denies any chest pain now or HA from the nitro drip.   I feel his chest pain was likely related to his acute appendicitis and will likely be resolved if we stop the drip.  He has troponin neg X 2 - DC nitro drip, discussed with nurse, will re-start if chest pain returns.  - SL nitro ordered as well  Murtis SinkSam Tayton Decaire, MD [facility] Resident, PGY-2 09/17/2013, 9:29 PM

## 2013-09-17 NOTE — Progress Notes (Signed)
Called by ED provider and informed that patient has acute appendicitis.   She states she will call general surgery for a consult.   We appreciate very much their consultation and the ED's CT scan finding said appendicitis. We will be glad to continue primary management and happy to follow general surgery's recommendations.   Murtis SinkSam Bradshaw, MD [facility] Resident, PGY-2 09/17/2013, 2:03 PM

## 2013-09-18 ENCOUNTER — Encounter (HOSPITAL_COMMUNITY): Payer: Self-pay | Admitting: Surgery

## 2013-09-18 DIAGNOSIS — E669 Obesity, unspecified: Secondary | ICD-10-CM

## 2013-09-18 DIAGNOSIS — Z9049 Acquired absence of other specified parts of digestive tract: Secondary | ICD-10-CM

## 2013-09-18 DIAGNOSIS — I2510 Atherosclerotic heart disease of native coronary artery without angina pectoris: Secondary | ICD-10-CM

## 2013-09-18 DIAGNOSIS — K3580 Unspecified acute appendicitis: Principal | ICD-10-CM

## 2013-09-18 DIAGNOSIS — F17200 Nicotine dependence, unspecified, uncomplicated: Secondary | ICD-10-CM

## 2013-09-18 DIAGNOSIS — R109 Unspecified abdominal pain: Secondary | ICD-10-CM

## 2013-09-18 HISTORY — DX: Acquired absence of other specified parts of digestive tract: Z90.49

## 2013-09-18 LAB — CBC
HCT: 39.6 % (ref 39.0–52.0)
HEMOGLOBIN: 13.8 g/dL (ref 13.0–17.0)
MCH: 32.5 pg (ref 26.0–34.0)
MCHC: 34.8 g/dL (ref 30.0–36.0)
MCV: 93.2 fL (ref 78.0–100.0)
PLATELETS: 214 10*3/uL (ref 150–400)
RBC: 4.25 MIL/uL (ref 4.22–5.81)
RDW: 14.1 % (ref 11.5–15.5)
WBC: 7.7 10*3/uL (ref 4.0–10.5)

## 2013-09-18 LAB — BASIC METABOLIC PANEL
BUN: 7 mg/dL (ref 6–23)
CHLORIDE: 101 meq/L (ref 96–112)
CO2: 23 meq/L (ref 19–32)
Calcium: 8.7 mg/dL (ref 8.4–10.5)
Creatinine, Ser: 0.93 mg/dL (ref 0.50–1.35)
GFR calc Af Amer: >90 mL/min (ref >90)
GFR calc non Af Amer: >90 mL/min (ref >90)
Glucose, Bld: 147 mg/dL — ABNORMAL HIGH (ref 70–99)
POTASSIUM: 3.8 meq/L (ref 3.7–5.3)
SODIUM: 136 meq/L — AB (ref 137–147)

## 2013-09-18 LAB — PROTIME-INR
INR: 0.91 (ref 0.00–1.49)
Prothrombin Time: 12.1 seconds (ref 11.6–15.2)

## 2013-09-18 MED ORDER — OXYCODONE-ACETAMINOPHEN 5-325 MG PO TABS: 1.0000 | ORAL_TABLET | ORAL | Status: DC | PRN

## 2013-09-18 MED ORDER — PANTOPRAZOLE SODIUM 40 MG PO TBEC
40.0000 mg | DELAYED_RELEASE_TABLET | Freq: Every day | ORAL | Status: DC
  Administered 2013-09-18: 40 mg via ORAL
  Filled 2013-09-18: qty 1

## 2013-09-18 MED ORDER — ISOSORBIDE MONONITRATE ER 30 MG PO TB24
30.0000 mg | ORAL_TABLET | Freq: Every day | ORAL | Status: DC
  Administered 2013-09-18: 30 mg via ORAL
  Filled 2013-09-18: qty 1

## 2013-09-18 NOTE — Progress Notes (Signed)
Pt leaving to go home with wife. Both iv's in left hand and rt forearm removed, tip intact and pt tolerated well. Pt giving d/c instructions and reviewed with nurse. Pt received prescription. Pt had no questions.

## 2013-09-18 NOTE — Progress Notes (Signed)
Family Medicine Teaching Service Daily Progress Note Intern Pager: [phone]  Patient name: David BankRichard V Boatman Medical record number: [id number] Date of birth: 09/10/1975 Age: 38 y.o. Gender: male  Primary Care Provider: Maryjean KaStreet, Christopher, MD Consultants: Cardiology, General surgery Code Status: Full  Pt Overview and Major Events to Date:  3/1- appendectomy  Assessment and Plan: [name] is a 38 y.o. male presenting with abdominal and chest pain. PMH is significant for prior CABG, Hypertension, Hyperlipidemia, obesity, and tobacco abuse.   # Acute appendicitis s/p appendectomy 3/1: abdominal pain started night of 2/28, presented 3/1 with CT. Pain initially subsided and became more chest pain, however while in ED it again worsened and CT abdomen was done showing acute appendicitis. WBC mildly elevated at 13.6 on admission, now 7.7. - appreciate general surgery's management with this patient. Doing well postop and okay to go home today if tolerating diet  # Chest pain, ACS rule out: prior history of CABG, NSTEMI. Workup including: Cmet wnl, negative troponin x 3, EKG unchanged, CXR with no acute disease.  - consulted cardiology, appreciate recommendations: okay to d/c today  - Risk stratification labs: TSH (1.802), A1C (6.1), Lipid panel (cholesterol 206, TG 436, HDL 35, unable to calc VLDL or LDL) - Continue statin, ASA, plavix, losartan, metoprolol, ranexa  - nitro drip d/c'd overnight, restart imdur  # Pre-diabetes: A1c 6.1 on admission - will discuss with patient, defer further education to outpatient f/u  # HLD: last lipid panel 11/2012, LDL 52, Trig 284 but previously 700+. Lipid panel today total cholesterol 206, TG 436, HDL 35, unable to calc VLDL or LDL - Continue fenofibrate and lipitor   # Tobacco abuse  - documented cessation, did not confirm  - will encourage continued cessation   FEN/GI: NPO today pending possible need for intervention  Prophylaxis: sq  heparin  Disposition: pending toleration of diet, likely today  Subjective:  Feels much better this morning, eating breakfast. Chest pain resolved yesterday after surgery. Denies SOB, CP, abdominal pain (has some soreness).  Objective: Temp:  [97.7 F (36.5 C)-98.6 F (37 C)] 97.7 F (36.5 C) (03/02 0410) Pulse Rate:  [77-111] 77 (03/02 0410) Resp:  [11-23] 11 (03/02 0410) BP: (100-167)/(58-92) 100/63 mmHg (03/02 0410) SpO2:  [88 %-98 %] 93 % (03/02 0410) Weight:  [264 lb 15.9 oz (120.2 kg)-265 lb (120.203 kg)] 264 lb 15.9 oz (120.2 kg) (03/02 0410) Physical Exam: General: NAD, sitting on side of bed eating breakfast Cardiovascular: RRR, normal heart sounds, no murmurs Respiratory: mild end exp wheezes bilaterally Abdomen: soft, obese, incisions with bandages in place clean/dry/intact Extremities: no edema/cyanosis, wwp\ Neuo: alert and oriented, no focal deficits  Laboratory:  Recent Labs Lab 09/17/13 0748 09/18/13 0406  WBC 13.6* 7.7  HGB 15.5 13.8  HCT 44.1 39.6  PLT 243 214    Recent Labs Lab 09/17/13 0748 09/18/13 0406  NA 141 136*  K 4.0 3.8  CL 100 101  CO2 22 23  BUN 8 7  CREATININE 0.93 0.93  CALCIUM 9.5 8.7  PROT 7.8  --   BILITOT 0.6  --   ALKPHOS 63  --   ALT 29  --   AST 23  --   GLUCOSE 149* 147*     Imaging/Diagnostic Tests: Dg Chest 2 View  09/17/2013   CLINICAL DATA:  Left chest pain  EXAM: CHEST - 2 VIEW  COMPARISON:  12/05/2012  FINDINGS: Lungs are clear. Heart size and mediastinal contours are within normal limits. No effusion.  Previous CABG.  IMPRESSION: No acute disease post CABG   Electronically Signed   By: Oley Balm M.D.   On: 09/17/2013 08:29   Ct Abdomen Pelvis W Contrast  09/17/2013   CLINICAL DATA:  Right lower abdominal pain  EXAM: CT ABDOMEN AND PELVIS WITH CONTRAST  TECHNIQUE: Multidetector CT imaging of the abdomen and pelvis was performed using the standard protocol following bolus administration of intravenous  contrast.  CONTRAST:  OMNIPAQUE IOHEXOL 300 MG/ML  SOLN  COMPARISON:  01/02/2011  FINDINGS: Previous median sternotomy. Subpleural scar or subsegmental atelectasis posteriorly in the visualized lung bases. Unremarkable liver, nondilated gallbladder, spleen, adrenal glands, pancreas. Bilateral nephrolithiasis, largest stone a 4 mm calculus in the lower pole right renal collecting system. Renal parenchyma unremarkable. No hydronephrosis or ureterectasis. No ureteral calculus. Urinary bladder is incompletely distended.  Stomach, small bowel, and colon are nondilated. There are inflammatory/ edematous changes around the appendix which is dilated up to 10 mm diameter and retrocecal. No evidence perforation or abscess. Bilateral pelvic vascular calcifications. No ascites. No free air. Patchy aortoiliac arterial calcifications without aneurysm.  IMPRESSION: 1. Probable early acute appendicitis without perforation or abscess. Critical Value/emergent results were called by telephone at the time of interpretation on 09/17/2013 at 1:56 PM to Dr. Teressa Lower , who verbally acknowledged these results. 2. Bilateral nephrolithiasis.   Electronically Signed   By: Oley Balm M.D.   On: 09/17/2013 13:57    Tawni Carnes, MD 09/18/2013, 7:31 AM PGY-1, [facility] Family Medicine FPTS Intern pager: [phone], text pages welcome

## 2013-09-18 NOTE — Progress Notes (Signed)
1 Day Post-Op  Subjective: Pt feels good.  Stabbing pain resolved, now with soreness.  No N/V.  Pain well controlled.  Ambulating well.  Getting ready to order a HH diet for breakfast.  Urinating well.    Objective: Vital signs in last 24 hours: Temp:  [97.7 F (36.5 C)-98.6 F (37 C)] 97.7 F (36.5 C) (03/02 0410) Pulse Rate:  [77-111] 77 (03/02 0410) Resp:  [11-23] 11 (03/02 0410) BP: (100-167)/(58-92) 100/63 mmHg (03/02 0410) SpO2:  [88 %-98 %] 93 % (03/02 0410) Weight:  [264 lb 15.9 oz (120.2 kg)-265 lb (120.203 kg)] 264 lb 15.9 oz (120.2 kg) (03/02 0410)    Intake/Output from previous day: 03/01 0701 - 03/02 0700 In: 1590.5 [P.O.:680; I.V.:910.5] Out: 1565 [Urine:1550; Blood:15] Intake/Output this shift:    PE: Gen:  Alert, NAD, pleasant Abd: Soft, mild tenderness, ND, +BS, no HSM, incisions C/D/I   Lab Results:   Recent Labs  09/17/13 0748 09/18/13 0406  WBC 13.6* 7.7  HGB 15.5 13.8  HCT 44.1 39.6  PLT 243 214   BMET  Recent Labs  09/17/13 0748 09/18/13 0406  NA 141 136*  K 4.0 3.8  CL 100 101  CO2 22 23  GLUCOSE 149* 147*  BUN 8 7  CREATININE 0.93 0.93  CALCIUM 9.5 8.7   PT/INR  Recent Labs  09/18/13 0406  LABPROT 12.1  INR 0.91   CMP     Component Value Date/Time   NA 136* 09/18/2013 0406   K 3.8 09/18/2013 0406   CL 101 09/18/2013 0406   CO2 23 09/18/2013 0406   GLUCOSE 147* 09/18/2013 0406   BUN 7 09/18/2013 0406   CREATININE 0.93 09/18/2013 0406   CREATININE 0.92 06/15/2012 1622   CALCIUM 8.7 09/18/2013 0406   PROT 7.8 09/17/2013 0748   ALBUMIN 4.2 09/17/2013 0748   AST 23 09/17/2013 0748   ALT 29 09/17/2013 0748   ALKPHOS 63 09/17/2013 0748   BILITOT 0.6 09/17/2013 0748   GFRNONAA >90 09/18/2013 0406   GFRAA >90 09/18/2013 0406   Lipase     Component Value Date/Time   LIPASE 29 09/17/2013 0748       Studies/Results: Dg Chest 2 View  09/17/2013   CLINICAL DATA:  Left chest pain  EXAM: CHEST - 2 VIEW  COMPARISON:  12/05/2012  FINDINGS: Lungs are  clear. Heart size and mediastinal contours are within normal limits. No effusion. Previous CABG.  IMPRESSION: No acute disease post CABG   Electronically Signed   By: Oley Balm M.D.   On: 09/17/2013 08:29   Ct Abdomen Pelvis W Contrast  09/17/2013   CLINICAL DATA:  Right lower abdominal pain  EXAM: CT ABDOMEN AND PELVIS WITH CONTRAST  TECHNIQUE: Multidetector CT imaging of the abdomen and pelvis was performed using the standard protocol following bolus administration of intravenous contrast.  CONTRAST:  OMNIPAQUE IOHEXOL 300 MG/ML  SOLN  COMPARISON:  01/02/2011  FINDINGS: Previous median sternotomy. Subpleural scar or subsegmental atelectasis posteriorly in the visualized lung bases. Unremarkable liver, nondilated gallbladder, spleen, adrenal glands, pancreas. Bilateral nephrolithiasis, largest stone a 4 mm calculus in the lower pole right renal collecting system. Renal parenchyma unremarkable. No hydronephrosis or ureterectasis. No ureteral calculus. Urinary bladder is incompletely distended.  Stomach, small bowel, and colon are nondilated. There are inflammatory/ edematous changes around the appendix which is dilated up to 10 mm diameter and retrocecal. No evidence perforation or abscess. Bilateral pelvic vascular calcifications. No ascites. No free air. Patchy aortoiliac arterial  calcifications without aneurysm.  IMPRESSION: 1. Probable early acute appendicitis without perforation or abscess. Critical Value/emergent results were called by telephone at the time of interpretation on 09/17/2013 at 1:56 PM to Dr. Teressa LowerVRINDA PICKERING , who verbally acknowledged these results. 2. Bilateral nephrolithiasis.   Electronically Signed   By: Oley Balmaniel  Hassell M.D.   On: 09/17/2013 13:57    Anti-infectives: Anti-infectives   Start     Dose/Rate Route Frequency Ordered Stop   09/17/13 2200  Ampicillin-Sulbactam (UNASYN) 3 g in sodium chloride 0.9 % 100 mL IVPB  Status:  Discontinued     3 g 100 mL/hr over 60  Minutes Intravenous Every 6 hours 09/17/13 1531 09/17/13 1844   09/17/13 1600  Ampicillin-Sulbactam (UNASYN) 3 g in sodium chloride 0.9 % 100 mL IVPB  Status:  Discontinued     3 g 100 mL/hr over 60 Minutes Intravenous  Once 09/17/13 1533 09/17/13 1844       Assessment/Plan Acute appendicitis POD #1 s/p lap appy (Dr. Magnus IvanBlackman 09/17/13) 1.  HH diet 2.  Ambulate and IS 3.  SCD's, heparin, and plavix 4.  From surgical standpoint can go home when medically stable if tolerating HH diet    LOS: 1 day    DORT, Shelena Castelluccio 09/18/2013, 7:19 AM Pager: [phone]

## 2013-09-18 NOTE — Progress Notes (Signed)
Utilization Review Completed.  

## 2013-09-18 NOTE — Progress Notes (Signed)
FMTS Attending Note Patient seen and examined by me, discussed with resident team and I agree with assessment and plan as documented in Dr Laban EmperorWight's note.  Patient reports resolution of RLQ pain (mild soreness post-op), complete resolution of chest pain which had been given as initial chief complaint.  We discussed his elevated A1C; his mother and grandmother had DM, mother died of CAD in her 9950s. I agree with plan for discharge to home, with close outpatient medical follow-up for pre-diabetes, hypertriglyceridemia, and CAD.  Surgical follow-up per Surgery service.  Paula ComptonJames Jeremian Whitby, MD

## 2013-09-18 NOTE — Discharge Instructions (Addendum)
You were admitted for abdominal and chest pain and found on CT scan to have appendicitis, which was removed in surgery on 3/1. While in the hospital we tested you for any evidence of cardiac problems (the tests were negative), and also had cardiology see you. Additional labs showed that your Hemoglobin A1c is 6.1 (tells us the average blood sugar over the past ~3 months) which is in the "Pre-diabetic" range. Normal is less than 5.7, and diabetes is greater than 6.5. Given your cardiac history, it is very important that you start changing your diet to reduce sugar and carbohydrates, eat more vegetables; please discuss this further with your PCP Dr. Casper HarrisonStreet.  Please call Dr. Eliberto IvoryBlackman's office to schedule a post surgery follow up for about 1 week from now.

## 2013-09-18 NOTE — Discharge Summary (Signed)
Family Medicine Teaching [facility] Discharge Summary  Patient name: [name] Medical record number: [id number] Date of birth: 06-21-1976 Age: 38 y.o. Gender: male Date of Admission: 09/17/2013  Date of Discharge: 09/18/2013 Admitting Physician: Janit Pagan, MD  Primary Care Provider: Maryjean Ka, MD Consultants: Cardiology, General surgery  Indication for Hospitalization: chest and abdominal pain  Discharge Diagnoses/Problem List:  Acute Appendicitis, s/p appendectomy CAD s/p CABG, stents (last cath 2013) Pre-diabetes (A1c 6.1) Hyperlipidemia / hypertriglyceridemia History of tobacco abuse  Disposition: home  Discharge Condition: stable, improved  Brief Hospital Course:  [name] is a 38 y.o. male that presented with acute onset of abdominal pain that changed to chest pain the night before admission. PMH significant for CAD s/p CABG and stent placement, hyperlipidemia, tobacco abuse. In ED patient originally complained primarily of chest pain, and given extensive cardiac history was to be worked up as ACS rule out with nitro drip started (normal EKG, negative troponins, normal CXR), however patient developed worsened abdominal pain and CT abdomen was ordered by ED and found to have acute appendicitis; additionally had WBC 13.6. General surgery was consulted and patient brought to the OR for laparoscopic appendectomy on 3/1. Patient did well post op with completely resolved chest pain and tolerated diet advancement the next morning, stable for discharge in the afternoon  Issues for Follow Up:  1. Pre-diabetes: patient with A1c 6.1, discussed with him before discharge the need to modify diet and prevent him from developing diabetes especially given his extensive coronary history. 2. History of Tobacco abuse: stated he quit November 2014, should have continued education and assistance with this 3. Hypertriglyceridemia: currently on fenofibrate and lipitor 80mg ,  however lipid panel still with TG of 436. May need modification of regimen as outpatient.  Significant Procedures: laparoscopic appendectomy  Significant Labs and Imaging:   Recent Labs Lab 09/17/13 0748 09/18/13 0406  WBC 13.6* 7.7  HGB 15.5 13.8  HCT 44.1 39.6  PLT 243 214    Recent Labs Lab 09/17/13 0748 09/18/13 0406  NA 141 136*  K 4.0 3.8  CL 100 101  CO2 22 23  GLUCOSE 149* 147*  BUN 8 7  CREATININE 0.93 0.93  CALCIUM 9.5 8.7  ALKPHOS 63  --   AST 23  --   ALT 29  --   ALBUMIN 4.2  --    Lipid Panel     Component Value Date/Time   CHOL 206* 09/17/2013 1200   TRIG 436* 09/17/2013 1200   HDL 35* 09/17/2013 1200   CHOLHDL 5.9 09/17/2013 1200   VLDL UNABLE TO CALCULATE IF TRIGLYCERIDE OVER 400 mg/dL 4/0/9811 9147   LDLCALC UNABLE TO CALCULATE IF TRIGLYCERIDE OVER 400 mg/dL 02/18/9561 1308   TSH 6.578 Hemoglobin A1c 6.1 D-dimer <0.27 Lipase 29  Results/Tests Pending at Time of Discharge: none  Discharge Medications:    Medication List         ALPRAZolam 0.5 MG tablet  Commonly known as:  XANAX  Take 1 tablet (0.5 mg total) by mouth 3 (three) times daily as needed for anxiety.     atorvastatin 80 MG tablet  Commonly known as:  LIPITOR  Take 1 tablet (80 mg total) by mouth every evening.     clopidogrel 75 MG tablet  Commonly known as:  PLAVIX  Take 1 tablet (75 mg total) by mouth every morning.     fenofibrate 48 MG tablet  Commonly known as:  TRICOR  Take 1 tablet (48 mg total)  by mouth every morning.     isosorbide mononitrate 30 MG 24 hr tablet  Commonly known as:  IMDUR  Take 1 tablet (30 mg total) by mouth daily.     losartan 100 MG tablet  Commonly known as:  COZAAR  Take 1 tablet (100 mg total) by mouth every morning.     methocarbamol 500 MG tablet  Commonly known as:  ROBAXIN  Take 1 tablet (500 mg total) by mouth 3 (three) times daily.     metoprolol tartrate 25 MG tablet  Commonly known as:  LOPRESSOR  Take 1 tablet (25 mg total)  by mouth 2 (two) times daily.     multivitamin with minerals Tabs tablet  Take 1 tablet by mouth every morning.     nitroGLYCERIN 0.4 MG SL tablet  Commonly known as:  NITROSTAT  Place 1 tablet (0.4 mg total) under the tongue every 5 (five) minutes as needed for chest pain.     oxyCODONE-acetaminophen 10-325 MG per tablet  Commonly known as:  PERCOCET  Take 0.5-1 tablets by mouth every 8 (eight) hours as needed for pain.     oxyCODONE-acetaminophen 5-325 MG per tablet  Commonly known as:  PERCOCET/ROXICET  Take 1 tablet by mouth every 4 (four) hours as needed for severe pain.     PRO-BIOTIC BLEND Caps  Take 1 capsule by mouth daily. Helps with digestion     ranolazine 500 MG 12 hr tablet  Commonly known as:  RANEXA  Take 1 tablet (500 mg total) by mouth 2 (two) times daily.        Discharge Instructions: Please refer to Patient Instructions section of EMR for full details.  Patient was counseled important signs and symptoms that should prompt return to medical care, changes in medications, dietary instructions, activity restrictions, and follow up appointments.   Follow-Up Appointments:     Follow-up Information   [facility]. Schedule an appointment as soon as possible for a visit in 1 week. (For hospital follow up)    Specialty:  General Surgery   Contact information:   [address] [place] [address] [phone]       Follow up with Maryjean KaStreet, Christopher, MD On 09/27/2013. (at 2:30pm for hospital follow up)    Specialty:  Family Medicine   Contact information:   [address] [place] [address] [phone]       Follow up with Verne CarrowMCALHANY,CHRISTOPHER, MD. (The office will call when July schedule comes out.)    Specialty:  Cardiology   Contact information:   [address] [place] [address] [phone]       Tawni CarnesAndrew Myrtha Tonkovich, MD 09/19/2013, 3:38 PM PGY-1, [facility]

## 2013-09-18 NOTE — Progress Notes (Signed)
Pt is resting at edge of bed sitting and eating lunch. Pt requested pain medication one time this am for pain in abdomen rating a 7/10 on pain scale. Pt stated after receiving medication that it took pain away to a 1-2/10 on pain scale. Pt has orders to discharge to home per md. Pt in no distress at this time. Pt's wife taking him home.

## 2013-09-18 NOTE — Progress Notes (Addendum)
Patient Name: Luci BankRichard V Callari Date of Encounter: 09/18/2013  Principal Problem:   Appendicitis, acute Active Problems:   HYPERLIPIDEMIA-MIXED   CAD - CABG '09, OM3 stent March 2010, last cath 4/13- medical Rx   HTN (hypertension)   Anxiety   Sciatica of right side   Chest pain with moderate risk of acute coronary syndrome    SUBJECTIVE: Chest pain has resolved, feels much better after surgery, has been trying to eat better, compliant with Rx till he got sick.  OBJECTIVE Filed Vitals:   09/17/13 2020 09/17/13 2354 09/18/13 0231 09/18/13 0410  BP: 128/72 105/58 119/77 100/63  Pulse: 95 84 81 77  Temp: 97.8 F (36.6 C) 97.8 F (36.6 C)  97.7 F (36.5 C)  TempSrc: Oral Oral  Oral  Resp: 14 15 14 11   Height:      Weight:    264 lb 15.9 oz (120.2 kg)  SpO2: 94% 93% 95% 93%    Intake/Output Summary (Last 24 hours) at 09/18/13 0631 Last data filed at 09/18/13 0200  Gross per 24 hour  Intake 1350.5 ml  Output   1215 ml  Net  135.5 ml   Filed Weights   09/17/13 1842 09/18/13 0410  Weight: 265 lb (120.203 kg) 264 lb 15.9 oz (120.2 kg)    PHYSICAL EXAM General: Well developed, well nourished, male in no acute distress. Head: Normocephalic, atraumatic.  Neck: Supple without bruits, JVD not elevated. Lungs:  Resp regular and unlabored, decreased BS left base, few rales right. Heart: RRR, S1, S2, no S3, S4, or murmur; no rub. Abdomen: Soft, tender, non-distended, BS + x 4. Incisions without drainage Extremities: No clubbing, cyanosis, no edema.  Neuro: Alert and oriented X 3. Moves all extremities spontaneously. Psych: Normal affect.  LABS: CBC:  Recent Labs  09/17/13 0748 09/18/13 0406  WBC 13.6* 7.7  NEUTROABS 11.6*  --   HGB 15.5 13.8  HCT 44.1 39.6  MCV 91.5 93.2  PLT 243 214   INR:  Recent Labs  09/18/13 0406  INR 0.91   Basic Metabolic Panel:  Recent Labs  40/98/1101/08/03 0748 09/18/13 0406  NA 141 136*  K 4.0 3.8  CL 100 101  CO2 22 23    GLUCOSE 149* 147*  BUN 8 7  CREATININE 0.93 0.93  CALCIUM 9.5 8.7   Liver Function Tests:  Recent Labs  09/17/13 0748  AST 23  ALT 29  ALKPHOS 63  BILITOT 0.6  PROT 7.8  ALBUMIN 4.2   Cardiac Enzymes:  Recent Labs  09/17/13 0748 09/17/13 1417 09/17/13 1945  TROPONINI <0.30 <0.30 <0.30   BNP: Pro B Natriuretic peptide (BNP)  Date/Time Value Ref Range Status  10/18/2011  6:03 PM 43.3  0 - 125 pg/mL Final   D-dimer:  Recent Labs  09/17/13 1136  DDIMER <0.27   Hemoglobin A1C:  Recent Labs  09/17/13 1136  HGBA1C 6.1*   Fasting Lipid Panel:  Recent Labs  09/17/13 1200  CHOL 206*  HDL 35*  LDLCALC UNABLE TO CALCULATE IF TRIGLYCERIDE OVER 400 mg/dL  TRIG 914436*  CHOLHDL 5.9   Thyroid Function Tests:  Recent Labs  09/17/13 1136  TSH 1.802   TELE:   SR, occ ST     Radiology/Studies: Dg Chest 2 View 09/17/2013   CLINICAL DATA:  Left chest pain  EXAM: CHEST - 2 VIEW  COMPARISON:  12/05/2012  FINDINGS: Lungs are clear. Heart size and mediastinal contours are within normal limits. No effusion. Previous  CABG.  IMPRESSION: No acute disease post CABG   Electronically Signed   By: Oley Balm M.D.   On: 09/17/2013 08:29   Ct Abdomen Pelvis W Contrast 09/17/2013   CLINICAL DATA:  Right lower abdominal pain  EXAM: CT ABDOMEN AND PELVIS WITH CONTRAST  TECHNIQUE: Multidetector CT imaging of the abdomen and pelvis was performed using the standard protocol following bolus administration of intravenous contrast.  CONTRAST:  OMNIPAQUE IOHEXOL 300 MG/ML  SOLN  COMPARISON:  01/02/2011  FINDINGS: Previous median sternotomy. Subpleural scar or subsegmental atelectasis posteriorly in the visualized lung bases. Unremarkable liver, nondilated gallbladder, spleen, adrenal glands, pancreas. Bilateral nephrolithiasis, largest stone a 4 mm calculus in the lower pole right renal collecting system. Renal parenchyma unremarkable. No hydronephrosis or ureterectasis. No ureteral  calculus. Urinary bladder is incompletely distended.  Stomach, small bowel, and colon are nondilated. There are inflammatory/ edematous changes around the appendix which is dilated up to 10 mm diameter and retrocecal. No evidence perforation or abscess. Bilateral pelvic vascular calcifications. No ascites. No free air. Patchy aortoiliac arterial calcifications without aneurysm.  IMPRESSION: 1. Probable early acute appendicitis without perforation or abscess. Critical Value/emergent results were called by telephone at the time of interpretation on 09/17/2013 at 1:56 PM to Dr. Teressa Lower , who verbally acknowledged these results. 2. Bilateral nephrolithiasis.   Electronically Signed   By: Oley Balm M.D.   On: 09/17/2013 13:57     Current Medications:  . atorvastatin  80 mg Oral QPM  . clopidogrel  75 mg Oral q morning - 10a  . fenofibrate  54 mg Oral Daily  . heparin  5,000 Units Subcutaneous 3 times per day  . losartan  100 mg Oral q morning - 10a  . methocarbamol  500 mg Oral TID  . metoprolol tartrate  25 mg Oral BID  . pantoprazole (PROTONIX) IV  40 mg Intravenous Q24H  . ranolazine  500 mg Oral BID   ASSESSMENT AND PLAN: This is a 38 y.o. male with a past medical history significant for CAD, CABG x 4 in Oct 2009 with L-LAD, S-Dx, S-OM3/RI. PCI with stent to OM3 in March 2010. Cath 10/2011 showed patent LIMA-LAD, SVG-OM3 (T), OM3 stent OK. Doing well at OV w/ CM 08/15/2013. Admitted 03/01 w/ chest/abd pain.  Principal Problem:   Appendicitis, acute - Surgery 03/01, per [facility]/IM  Active Problems:   HYPERLIPIDEMIA-MIXED - on Lipitor 80/Tricor, encourage diet/Rx compliance and f/u w/ CM  Chest pain with moderate risk of acute coronary syndrome - ez neg MI, tolerated surgery well, follow, MD advise if further eval needed, O/W will set f/u appt w/ CM    CAD - CABG '09, OM3 stent March 2010, last cath 4/13- medical Rx - see above    HTN (hypertension) - good control now, med changes  per MD    Anxiety - per IM    Sciatica of right side - per IM     Signed, Theodore Demark , PA-C 6:31 AM 09/18/2013  Patient seen and examined. Agree with assessment and plan. Pt feels well s/p surgery for appendicitis. No chest pain. Pain was different from his cardiac pain. Stable hemodynamics. Troponins negative. ECG with NSR and without significant changes with small inferior q waves. Cardiac stable. Ok from cardiac standpoint for dc. Conitnue medical therapy management.   Lennette Bihari, MD, [facility] 09/18/2013 8:14 AM

## 2013-09-18 NOTE — Progress Notes (Signed)
Patient interviewed and examined, agree with PA note above.  Mariella SaaBenjamin T Caylan Chenard MD, FACS  09/18/2013 10:06 AM

## 2013-09-20 ENCOUNTER — Telehealth (INDEPENDENT_AMBULATORY_CARE_PROVIDER_SITE_OTHER): Payer: Self-pay | Admitting: General Surgery

## 2013-09-20 ENCOUNTER — Telehealth: Payer: Self-pay | Admitting: *Deleted

## 2013-09-20 NOTE — Telephone Encounter (Signed)
Authorized six visits for Mr [name] to [facility].Keren Alverio, Rodena Medinobert Lee

## 2013-09-20 NOTE — Telephone Encounter (Signed)
Pt called for follow up appt with Dr. Magnus IvanBlackman, per his discharge instructions.  Called [place], [place], to ask if he needed to be seen at the [facility] or need to see surgeon instead.  Per Ann MakiWJ, appt made to see Dr. Magnus IvanBlackman on Monday, October 09, 2013, at 11:20 am.  LM on VM for pt to call back for this information.

## 2013-09-20 NOTE — Telephone Encounter (Signed)
Pt returned call and given information for follow up appt.  Also instructed pt okay to take showers, wash area with his hands with antibacterial soap, rinse well and pat dry.  He understands.

## 2013-09-27 ENCOUNTER — Encounter: Payer: Self-pay | Admitting: Family Medicine

## 2013-09-27 ENCOUNTER — Ambulatory Visit (INDEPENDENT_AMBULATORY_CARE_PROVIDER_SITE_OTHER): Payer: Medicaid Other | Admitting: Family Medicine

## 2013-09-27 VITALS — BP 120/82 | HR 73 | Temp 98.0°F | Ht 71.0 in | Wt 282.0 lb

## 2013-09-27 DIAGNOSIS — F419 Anxiety disorder, unspecified: Secondary | ICD-10-CM

## 2013-09-27 DIAGNOSIS — K3580 Unspecified acute appendicitis: Secondary | ICD-10-CM

## 2013-09-27 DIAGNOSIS — F411 Generalized anxiety disorder: Secondary | ICD-10-CM

## 2013-09-27 MED ORDER — CLONAZEPAM 1 MG PO TABS: 0.5000 mg | ORAL_TABLET | Freq: Two times a day (BID) | ORAL | Status: DC | PRN

## 2013-09-27 NOTE — Assessment & Plan Note (Signed)
A: Symptoms suggestive of generalized anxiety without much concern for true panic disorder. Generally has been well-controlled with a Xanax in the past, with numerous psychosocial stressors (financial, marital, etc), now with added stress of living with his father-in-law, with Xanax now much less effective than previously and with impaired sleep a major concern. Pt has seen [facility] and [facility] without much help from counseling, in the past. He has also been tried on a few different meds in the past by Dr. Louanne Beltonitch, with notably poor tolerance to Celexa and (he thinks) Zoloft.  P: Discontinue Xanax and retry Klonopin; new Rx given today. Pt has tried this in the past and thinks it helped and I would prefer a longer-acting benzo in general, especially to help with sleep. May need to consider another SSRI or antidepressant. Will follow up as needed; discussed with pt that therapy / counseling in conjunction with medication may be more helpful, but we agreed to try managing him with medications prescribed by me for now, with potential referral to specialist mental health provider in the future if needed, as he has had little success with therapy in the past.

## 2013-09-27 NOTE — Progress Notes (Signed)
   Subjective:    Patient ID: [name], male    DOB: 05/17/1976, 38 y.o.   MRN: [id number]  HPI: Pt presents to clinic for hospital follow-up s/p appendectomy on 3/1 for acute appendicitis. - pt presented initially with chest pain and was to be admitted for ACS work-up, though abdominal pain developed and CT showed acute appendicitis - surgery was consulted and performed lap appendectomy (Dr. Magnus IvanBlackman), with good improvement in symptoms (chest and abd, almost immediately) - pt was discharged the next day, and for the last week has been having some stiffness and pain, but in general has been doing much better - pt denies fevers, chills, N/V; he says his stool has been slightly looser, but this also has been better - pt denies difficulty with the laparoscopic surgery incisions; he has follow-up on the 23rd with Dr. Magnus IvanBlackman.  Pt also requests to discuss anxiety. He has been on Xanax for a long time (at least about 2 years) which is no longer helping - pt's father-in-law moved in with them recently and he has had other psychosocial stressors - his symptoms are described as intense nervousness, jitteriness, worrying, mind racing - his symptoms typically last 1-2 hours or several hours longer if "unchecked" (if he doesn't do something to "get away from the house")   Review of Systems: As above.      Objective:   Physical Exam BP 120/82  Pulse 73  Temp(Src) 98 F (36.7 C) (Oral)  Ht 5\' 11"  (1.803 m)  Wt 282 lb (127.914 kg)  BMI 39.35 kg/m2 Gen: well-appearing adult male Cardio: RRR, no murmur Abd: soft, mild appropriate tenderness around laparoscopic incisions, BS+ Skin: incisions well-healing, no bleeding or redness / induration Ext: warm, well-perfused  Mental Status Examination/Evaluation: Objective:  Appearance: Well Groomed  Psychomotor Activity:  Normal  Eye Contact::  Good  Speech:  Clear and Coherent and Normal Rate  Volume:  Normal  Mood:  Slightly anxious-appearing    Affect:  Congruent  Thought Process:  Coherent and Goal Directed  Orientation:  Full (Time, Place, and Person)  Thought Content:  Negative  Suicidal Thoughts:  No  Homicidal Thoughts:  No  Judgement:  Fair  Insight:  Fair      Assessment & Plan:

## 2013-09-27 NOTE — Patient Instructions (Signed)
Thank you for coming in, today!  Make sure you keep your appointment with Dr. Magnus IvanBlackman. If you have any trouble with the incisions or with abdominal pain, call his office, then call us depending on what they tell you.  For your anxiety: STOP taking the Xanax. You can take it to your pharmacy to get rid of it. START taking Klonopin (clonazepam). It doesn't work as fast as Xanax, but it lasts much longer. Take it at night to start with, to see how it makes you feel. If it doesn't help, when you come back to see me, let me know.  Come back to see me after you see Dr. Clifton JamesMcAlhany in May, or sooner if you need. Please feel free to call with any questions or concerns at any time, at [phone]. --Dr. Casper HarrisonStreet

## 2013-09-27 NOTE — Assessment & Plan Note (Signed)
S/p lap appendectomy by Dr. Magnus IvanBlackman on 3/1, doing well post-op with no obvious complications. Pain improving, bowel habits returning to normal, no signs / symptoms to suggest post-op infection. Pt to follow up with Dr. Magnus IvanBlackman on 3/23. Instructed him to follow up as instructed and to call surgery if there are any apparent complications, then to call here depending on what they tell him if necessary.

## 2013-10-09 ENCOUNTER — Ambulatory Visit (INDEPENDENT_AMBULATORY_CARE_PROVIDER_SITE_OTHER): Payer: Medicaid Other | Admitting: Surgery

## 2013-10-09 ENCOUNTER — Encounter (INDEPENDENT_AMBULATORY_CARE_PROVIDER_SITE_OTHER): Payer: Self-pay | Admitting: Surgery

## 2013-10-09 VITALS — BP 128/80 | HR 72 | Temp 98.6°F | Resp 18 | Ht 71.0 in | Wt 281.8 lb

## 2013-10-09 DIAGNOSIS — Z09 Encounter for follow-up examination after completed treatment for conditions other than malignant neoplasm: Secondary | ICD-10-CM

## 2013-10-09 NOTE — Progress Notes (Signed)
Subjective:     Patient ID: [name], male   DOB: 10/13/1975, 38 y.o.   MRN: [id number]  HPI He is here for his first postop visit status post Laparoscopic appendectomy performed on March 1. He is doing well except for some mild comfort in the abdomen. He denies fevers. He is eating well  Review of Systems     Objective:   Physical Exam On exam, his abdomen is soft and nontender and his incisions are healing well. The final pathology confirmed appendicitis    Assessment:     Patient stable postop     Plan:     He may resume normal activity. He will see us back as needed

## 2013-11-07 ENCOUNTER — Other Ambulatory Visit: Payer: Self-pay | Admitting: Family Medicine

## 2013-11-20 ENCOUNTER — Other Ambulatory Visit: Payer: Self-pay | Admitting: Family Medicine

## 2013-12-12 ENCOUNTER — Other Ambulatory Visit: Payer: Medicaid Other

## 2014-01-01 ENCOUNTER — Other Ambulatory Visit: Payer: Self-pay | Admitting: Family Medicine

## 2014-02-23 ENCOUNTER — Emergency Department (HOSPITAL_COMMUNITY)
Admission: EM | Admit: 2014-02-23 | Discharge: 2014-02-23 | Disposition: A | Discharge status: Home / Self Care | Payer: Medicare Other | Attending: Emergency Medicine | Admitting: Emergency Medicine

## 2014-02-23 ENCOUNTER — Encounter (HOSPITAL_COMMUNITY): Payer: Self-pay | Admitting: Emergency Medicine

## 2014-02-23 DIAGNOSIS — Z87891 Personal history of nicotine dependence: Secondary | ICD-10-CM | POA: Diagnosis not present

## 2014-02-23 DIAGNOSIS — Z8709 Personal history of other diseases of the respiratory system: Secondary | ICD-10-CM | POA: Insufficient documentation

## 2014-02-23 DIAGNOSIS — I10 Essential (primary) hypertension: Secondary | ICD-10-CM | POA: Insufficient documentation

## 2014-02-23 DIAGNOSIS — E785 Hyperlipidemia, unspecified: Secondary | ICD-10-CM | POA: Diagnosis not present

## 2014-02-23 DIAGNOSIS — Z951 Presence of aortocoronary bypass graft: Secondary | ICD-10-CM | POA: Diagnosis not present

## 2014-02-23 DIAGNOSIS — X500XXA Overexertion from strenuous movement or load, initial encounter: Secondary | ICD-10-CM | POA: Diagnosis not present

## 2014-02-23 DIAGNOSIS — I252 Old myocardial infarction: Secondary | ICD-10-CM | POA: Insufficient documentation

## 2014-02-23 DIAGNOSIS — Z9861 Coronary angioplasty status: Secondary | ICD-10-CM | POA: Insufficient documentation

## 2014-02-23 DIAGNOSIS — Z79899 Other long term (current) drug therapy: Secondary | ICD-10-CM | POA: Insufficient documentation

## 2014-02-23 DIAGNOSIS — Y929 Unspecified place or not applicable: Secondary | ICD-10-CM | POA: Diagnosis not present

## 2014-02-23 DIAGNOSIS — IMO0002 Reserved for concepts with insufficient information to code with codable children: Secondary | ICD-10-CM | POA: Insufficient documentation

## 2014-02-23 DIAGNOSIS — Z7902 Long term (current) use of antithrombotics/antiplatelets: Secondary | ICD-10-CM | POA: Insufficient documentation

## 2014-02-23 DIAGNOSIS — S39012A Strain of muscle, fascia and tendon of lower back, initial encounter: Secondary | ICD-10-CM

## 2014-02-23 DIAGNOSIS — S335XXA Sprain of ligaments of lumbar spine, initial encounter: Secondary | ICD-10-CM | POA: Diagnosis not present

## 2014-02-23 DIAGNOSIS — Y9389 Activity, other specified: Secondary | ICD-10-CM | POA: Insufficient documentation

## 2014-02-23 MED ORDER — HYDROCODONE-ACETAMINOPHEN 5-325 MG PO TABS
1.0000 | ORAL_TABLET | Freq: Once | ORAL | Status: AC
  Administered 2014-02-23: 1 via ORAL
  Filled 2014-02-23: qty 1

## 2014-02-23 MED ORDER — OXYCODONE-ACETAMINOPHEN 5-325 MG PO TABS: 1.0000 | ORAL_TABLET | Freq: Four times a day (QID) | ORAL | Status: DC | PRN

## 2014-02-23 MED ORDER — KETOROLAC TROMETHAMINE 60 MG/2ML IM SOLN
60.0000 mg | Freq: Once | INTRAMUSCULAR | Status: AC
  Administered 2014-02-23: 60 mg via INTRAMUSCULAR
  Filled 2014-02-23: qty 2

## 2014-02-23 MED ORDER — PREDNISONE 50 MG PO TABS: 50.0000 mg | ORAL_TABLET | Freq: Every day | ORAL | Status: DC

## 2014-02-23 MED ORDER — CYCLOBENZAPRINE HCL 10 MG PO TABS: 10.0000 mg | ORAL_TABLET | Freq: Three times a day (TID) | ORAL | Status: DC | PRN

## 2014-02-23 NOTE — ED Provider Notes (Signed)
CSN: [id number]     Arrival date & time 02/23/14  1744 History  This chart was scribed for non-physician practitioner, Ebbie Ridge, PA-C,working with Purvis Sheffield, MD, by Karle Plumber, ED Scribe. This patient was seen in room WTR8/WTR8 and the patient's care was started at 6:22 PM.  Chief Complaint  Patient presents with  . Back Pain   Patient is a 38 y.o. male presenting with back pain. The history is provided by the patient. No language interpreter was used.  Back Pain Associated symptoms: no numbness and no weakness    HPI Comments:  [name] is a 38 y.o. male who presents to the Emergency Department complaining of severe lower back pain onset two days. Pt reports he was moving heavy furniture when the pain started suddenly. He reports taking Tylenol and alternating heat and ice with minimal relief. He reports presenting to a specialist approximately 1.5 years ago with similar symptoms but states he was told there was nothing wrong. He denies numbness, tingling or weakness of the lower extremities.  Past Medical History  Diagnosis Date  . Hypertension   . MI (myocardial infarction)   . Hx of CABG   . Heart attack   . Bronchitis   . Tobacco user   . Hyperlipidemia   . MYOCARDIAL INFARCTION 10/13/2008    Qualifier: Diagnosis of  By: Juanda Chance, MD, Johny Chess    Past Surgical History  Procedure Laterality Date  . Coronary artery bypass graft      status post diaphragmatic wall infarction Rx BMS RCA 03-11-08   . Orthopedic surgery    . Coronary stent placement    . Laparoscopic appendectomy N/A 09/17/2013    Procedure: APPENDECTOMY LAPAROSCOPIC;  Surgeon: Shelly Rubenstein, MD;  Location: [facility] OR;  Service: General;  Laterality: N/A;  . Appendectomy     Family History  Problem Relation Age of Onset  . Cancer Mother   . Cancer Sister   . Diabetes Mother   . Diabetes Sister   . Hyperlipidemia Mother   . Hypertension Mother   . Hyperlipidemia Sister   .  Hypertension Sister   . Early death Mother   . Early death Sister    History  Substance Use Topics  . Smoking status: Former Smoker -- 0.20 packs/day for 20 years    Types: Cigarettes    Quit date: 06/15/2013  . Smokeless tobacco: Never Used     Comment: E-Cigarette  . Alcohol Use: No     Comment: maybe 3-4 drinks a week, occasionally.    Review of Systems  Musculoskeletal: Positive for back pain.  Neurological: Negative for weakness and numbness.  All other systems reviewed and are negative.   Allergies  Codeine and Celexa  Home Medications   Prior to Admission medications   Medication Sig Start Date End Date Taking? Authorizing Provider  atorvastatin (LIPITOR) 80 MG tablet TAKE ONE TABLET BY MOUTH EVERY EVENING    Stephanie Coup Street, MD  clonazePAM (KLONOPIN) 1 MG tablet Take 0.5-1 tablets (0.5-1 mg total) by mouth 2 (two) times daily as needed for anxiety. 09/27/13   Stephanie Coup Street, MD  clopidogrel (PLAVIX) 75 MG tablet TAKE ONE TABLET BY MOUTH IN THE MORNING    Stephanie Coup Street, MD  fenofibrate (TRICOR) 48 MG tablet Take 1 tablet (48 mg total) by mouth every morning. 08/22/13   Stephanie Coup Street, MD  isosorbide mononitrate (IMDUR) 30 MG 24 hr tablet Take 1 tablet (30 mg total) by  mouth daily.    Stephanie Couphristopher M Street, MD  losartan (COZAAR) 100 MG tablet Take 1 tablet (100 mg total) by mouth every morning. 08/22/13 08/22/14  Stephanie Couphristopher M Street, MD  methocarbamol (ROBAXIN) 500 MG tablet Take 1 tablet (500 mg total) by mouth 3 (three) times daily. 08/04/13   Erick ColaceAndrew E Kirsteins, MD  metoprolol tartrate (LOPRESSOR) 25 MG tablet Take 1 tablet (25 mg total) by mouth 2 (two) times daily. 05/03/13   Stephanie Couphristopher M Street, MD  Multiple Vitamin (MULITIVITAMIN WITH MINERALS) TABS Take 1 tablet by mouth every morning.     Historical Provider, MD  nitroGLYCERIN (NITROSTAT) 0.4 MG SL tablet Place 1 tablet (0.4 mg total) under the tongue every 5 (five) minutes as needed for chest  pain. 08/08/13   Laurey Moralealton S McLean, MD  oxyCODONE-acetaminophen (PERCOCET) 10-325 MG per tablet Take 0.5-1 tablets by mouth every 8 (eight) hours as needed for pain. 08/10/13   Stephanie Couphristopher M Street, MD  Probiotic Product (PRO-BIOTIC BLEND) CAPS Take 1 capsule by mouth daily. Helps with digestion    Historical Provider, MD  ranolazine (RANEXA) 500 MG 12 hr tablet Take 1 tablet (500 mg total) by mouth 2 (two) times daily. 10/20/12 10/20/13  Kathleene Hazelhristopher D McAlhany, MD  ranolazine (RANEXA) 500 MG 12 hr tablet Take 1 tablet (500 mg total) by mouth 2 (two) times daily. 11/07/13   Stephanie Couphristopher M Street, MD   Triage Vitals: BP 140/91  Pulse 81  Temp(Src) 98.1 F (36.7 C) (Oral)  Resp 16  SpO2 98% Physical Exam  Nursing note and vitals reviewed. Constitutional: He is oriented to person, place, and time. He appears well-developed and well-nourished.  HENT:  Head: Normocephalic and atraumatic.  Neck: Normal range of motion.  Cardiovascular: Normal rate and regular rhythm.   Pulmonary/Chest: Effort normal and breath sounds normal.  Musculoskeletal: Normal range of motion.  Palpable pain in lower lumbar region.  Neurological: He is alert and oriented to person, place, and time. He has normal reflexes. He exhibits normal muscle tone. Coordination normal.  Skin: Skin is warm and dry.  Psychiatric: He has a normal mood and affect. His behavior is normal.    ED Course  Procedures (including critical care time) DIAGNOSTIC STUDIES: Oxygen Saturation is 98% on RA, normal by my interpretation.   COORDINATION OF CARE: 6:24 PM- Will order pain medication. Pt verbalizes understanding and agrees to plan.  Medications  HYDROcodone-acetaminophen (NORCO/VICODIN) 5-325 MG per tablet 1 tablet (1 tablet Oral Given 02/23/14 1834)  ketorolac (TORADOL) injection 60 mg (60 mg Intramuscular Given 02/23/14 1834)      I personally performed the services described in this documentation, which was scribed in my presence. The  recorded information has been reviewed and is accurate.    Carlyle Dollyhristopher W Timia Casselman, PA-C 02/23/14 1854  Carlyle Dollyhristopher W Satoru Milich, PA-C 02/23/14 1858

## 2014-02-23 NOTE — ED Notes (Signed)
Pt reports he was helping move furniture on Wednesday, has had back pain since then. Pain with walking and sitting. Pain 10/10. Reports he hurt his back 1 year ago and pain lasted 1 month. Has tried heating pad with no relief.

## 2014-02-23 NOTE — Discharge Instructions (Signed)
Return here as needed. Follow up with the doctor provided. Use ice and heat on your lower back. °

## 2014-02-23 NOTE — ED Provider Notes (Signed)
Medical screening examination/treatment/procedure(s) were performed by non-physician practitioner and as supervising physician I was immediately available for consultation/collaboration.   EKG Interpretation None        Tunis Gentle, MD 02/23/14 2345 

## 2014-02-25 ENCOUNTER — Other Ambulatory Visit: Payer: Self-pay | Admitting: Family Medicine

## 2014-02-28 ENCOUNTER — Telehealth: Payer: Self-pay | Admitting: Family Medicine

## 2014-02-28 NOTE — Telephone Encounter (Signed)
Patient scheduled for same day 8/13.

## 2014-02-28 NOTE — Telephone Encounter (Signed)
Needs referral to neurologist.  Cannot move his head to the left. Has appt aug 26 but cannot wait until then

## 2014-03-01 ENCOUNTER — Encounter: Payer: Self-pay | Admitting: Family Medicine

## 2014-03-01 ENCOUNTER — Ambulatory Visit (INDEPENDENT_AMBULATORY_CARE_PROVIDER_SITE_OTHER): Payer: Medicare Other | Admitting: Family Medicine

## 2014-03-01 VITALS — BP 118/69 | HR 77 | Temp 98.1°F | Ht 71.0 in | Wt 281.0 lb

## 2014-03-01 DIAGNOSIS — M542 Cervicalgia: Secondary | ICD-10-CM | POA: Diagnosis not present

## 2014-03-01 DIAGNOSIS — M545 Low back pain, unspecified: Secondary | ICD-10-CM | POA: Diagnosis not present

## 2014-03-01 MED ORDER — OXYCODONE-ACETAMINOPHEN 10-325 MG PO TABS: 0.5000 | ORAL_TABLET | Freq: Three times a day (TID) | ORAL | Status: DC | PRN

## 2014-03-01 MED ORDER — CYCLOBENZAPRINE HCL 10 MG PO TABS: 10.0000 mg | ORAL_TABLET | Freq: Three times a day (TID) | ORAL | Status: DC | PRN

## 2014-03-01 NOTE — Assessment & Plan Note (Signed)
Appears secondary to underlying muscle spasm. Will treat with Flexeril.

## 2014-03-01 NOTE — Assessment & Plan Note (Signed)
Acute on chronic. Will treat with short duration of Percocet 10-325 mg (#30) and flexeril.

## 2014-03-01 NOTE — Patient Instructions (Signed)
Your pain is from strained muscles and associated spasm.  I am giving you flexeril and some pain medication for this.  This will slowly improve.  Be sure to follow up closely with Dr. Casper HarrisonStreet.  Take care,  Dr. Adriana Simasook

## 2014-03-01 NOTE — Progress Notes (Signed)
   Subjective:    Patient ID: [name], male    DOB: 07/02/1976, 38 y.o.   MRN: [id number]  HPI 38 year old male with an extensive cardiac history and long-standing low back pain/sciatica/chronic pain presents with complaints of back and neck pain.  1) Back and Neck pain - Patient has known long-standing low back pain.  He has been seen by Dr. Wynn BankerKirsteins previously. - He reports that he was helping a friend move approximately one week ago (he was lifting heavy objects including furniture).  The day after (8/3) he developed severe low back pain and neck pain. - He was seen in the ED for this on 8/7.  History with Percocet and Flexeril. - Today he presents for same day appointment with continued complaints of low back pain and neck pain.  He states that slow back pain is interfering with his ability to move/ambulate.  Additionally, his neck pain is limiting his ROM.   - Pain is located diffusely in the low back and surrounding musculature.  His neck pain is located primarily in left trapezius muscle. Pain is currently severe. - He reports chronic lower extremity numbness/tingling.  He also reports that he has frequent numbness and tingling of his fingers and hands which he states she's had for quite some time after developed heart disease. - No fevers, chills, saddle anesthesia, incontinence.  Review of Systems Per HPI    Objective:   Physical Exam Filed Vitals:   03/01/14 0918  BP: 118/69  Pulse: 77  Temp: 98.1 F (36.7 C)   General: well appearing gentleman in NAD. MSK: Back - Lumbar spine - paraspinous musculature in the low back tender to palpation. Range of motion decreased secondary to pain.  No overlying redness or erythema noted. Neck - left trapezius muscle tender to palpation. ROM - left rotation significantly decreased secondary to pain and spasm.  No spinal tenderness.       Assessment & Plan:  See Problem List

## 2014-03-08 ENCOUNTER — Encounter: Payer: Medicaid Other | Admitting: Cardiovascular Disease

## 2014-03-08 NOTE — Progress Notes (Signed)
History of Present Illness: 38 year old male with history of CAD with serial PCI and a 4-vessel CABG in 2009, HTN, HLD, tobacco abuse who is here today for cardiac follow up. His coronary history began in 2009 with an inferior MI requiring RCA PCI and subsequent CABG. He then presented in 2010 with an NSTEMI in 2010 requiring LCx PCI. He underwent repeat coronary angiography in September of 2011 and was found to have stable native and bypass graft anatomy not requiring any intervention at the time. [facility] with chest pain on 10/19/11 and [facility] per Dr. Riley KillStuckey. Cardiac cath on 10/19/11 with stable CAD, bypasses. He was discharged home on Imdur and Ranexa. [facility], [facility] with chest pain. Ruled out for MI with serial cardiac enzymes.   He is here today for cardiac follow up. He has been doing well. He has had no chest pain. He is walking 1.5 miles in the mornings and having no pains. 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 Breathing is at baseline. He has stopped smoking. Anxiety controlled with prn Xanax, now written in primary care. He is still having back pain and has plans for a spinal injection. He needs to come off of Plavix for this.   Primary Care Physician: [facility]  Last Lipid Profile:Lipid Panel     Component Value Date/Time   CHOL 206* 09/17/2013 1200   TRIG 436* 09/17/2013 1200   HDL 35* 09/17/2013 1200   CHOLHDL 5.9 09/17/2013 1200   VLDL UNABLE TO CALCULATE IF TRIGLYCERIDE OVER 400 mg/dL 7/8/29563/07/2013 21301200   LDLCALC UNABLE TO CALCULATE IF TRIGLYCERIDE OVER 400 mg/dL 8/6/57843/07/2013 69621200    Past Medical History  Diagnosis Date  . Hypertension   . MI (myocardial infarction)   . Hx of CABG   . Heart attack   . Bronchitis   . Tobacco user   . Hyperlipidemia   . MYOCARDIAL INFARCTION 10/13/2008    Qualifier: Diagnosis of  By: Juanda ChanceBrodie, MD, Johny ChessFACC, Bruce Rogers      Past Surgical History  Procedure Laterality Date  . Coronary artery bypass graft      status post diaphragmatic wall infarction Rx BMS RCA 03-11-08   . Orthopedic surgery    . Coronary stent placement    . Laparoscopic appendectomy N/A 09/17/2013    Procedure: APPENDECTOMY LAPAROSCOPIC;  Surgeon: Shelly Rubensteinouglas A Blackman, MD;  Location: [facility] OR;  Service: General;  Laterality: N/A;  . Appendectomy      Current Outpatient Prescriptions  Medication Sig Dispense Refill  . atorvastatin (LIPITOR) 80 MG tablet Take 1 tablet (80 mg total) by mouth daily at 6 PM.  30 tablet  1  . clonazePAM (KLONOPIN) 1 MG tablet Take 0.5-1 tablets (0.5-1 mg total) by mouth 2 (two) times daily as needed for anxiety.  60 tablet  0  . clopidogrel (PLAVIX) 75 MG tablet Take 1 tablet (75 mg total) by mouth daily.  30 tablet  1  . cyclobenzaprine (FLEXERIL) 10 MG tablet Take 1 tablet (10 mg total) by mouth 3 (three) times daily as needed for muscle spasms.  90 tablet  0  . fenofibrate (TRICOR) 48 MG tablet Take 1 tablet (48 mg total) by mouth daily.  30 tablet  1  . isosorbide mononitrate (IMDUR) 30 MG 24 hr tablet Take 1 tablet (30 mg total) by mouth daily.  30 tablet  3  .  losartan (COZAAR) 100 MG tablet Take 1 tablet (100 mg total) by mouth every morning.  30 tablet  3  . metoprolol tartrate (LOPRESSOR) 25 MG tablet Take 1 tablet (25 mg total) by mouth 2 (two) times daily.  60 tablet  1  . Multiple Vitamin (MULITIVITAMIN WITH MINERALS) TABS Take 1 tablet by mouth every morning.       . nitroGLYCERIN (NITROSTAT) 0.4 MG SL tablet Place 1 tablet (0.4 mg total) under the tongue every 5 (five) minutes as needed for chest pain.  25 tablet  2  . oxyCODONE-acetaminophen (PERCOCET) 10-325 MG per tablet Take 0.5-1 tablets by mouth every 8 (eight) hours as needed for pain.  30 tablet  0  . predniSONE (DELTASONE) 50 MG tablet Take 1 tablet (50 mg total) by mouth daily.  5 tablet  0  . Probiotic Product (PRO-BIOTIC BLEND) CAPS Take 1  capsule by mouth daily. Helps with digestion      . ranolazine (RANEXA) 500 MG 12 hr tablet Take 1 tablet (500 mg total) by mouth 2 (two) times daily.  180 tablet  3  . ranolazine (RANEXA) 500 MG 12 hr tablet Take 1 tablet (500 mg total) by mouth 2 (two) times daily.  180 tablet  1   No current facility-administered medications for this visit.    Allergies  Allergen Reactions  . Codeine Itching  . Celexa [Citalopram] Itching and Rash    History   Social History  . Marital Status: Divorced    Spouse Name: N/A    Number of Children: N/A  . Years of Education: N/A   Occupational History  . Not on file.   Social History Main Topics  . Smoking status: Former Smoker -- 0.20 packs/day for 20 years    Types: Cigarettes    Quit date: 06/15/2013  . Smokeless tobacco: Never Used     Comment: E-Cigarette  . Alcohol Use: No     Comment: maybe 3-4 drinks a week, occasionally.  . Drug Use: No     Comment: former   . Sexual Activity: Not on file   Other Topics Concern  . Not on file   Social History Narrative  . No narrative on file    Family History  Problem Relation Age of Onset  . Cancer Mother   . Cancer Sister   . Diabetes Mother   . Diabetes Sister   . Hyperlipidemia Mother   . Hypertension Mother   . Hyperlipidemia Sister   . Hypertension Sister   . Early death Mother   . Early death Sister     Review of Systems:  As stated in the HPI and otherwise negative.   There were no vitals taken for this visit.  Physical Examination: General: Well developed, well nourished, NAD HEENT: OP clear, mucus membranes moist SKIN: warm, dry. No rashes. Neuro: No focal deficits Musculoskeletal: Muscle strength 5/5 all ext Psychiatric: Mood and affect normal Neck: No JVD, no carotid bruits, no thyromegaly, no lymphadenopathy. Lungs:Clear bilaterally, no wheezes, rhonci, crackles Cardiovascular: Regular rate and rhythm. No murmurs, gallops or rubs. Abdomen:Soft. Bowel sounds  present. Non-tender.  Extremities: No lower extremity edema. Pulses are 2 + in the bilateral DP/PT.  Assessment and Plan:   1. CAD: Stable. Cath April 2013 with stable severe disease s/p CABG. He is doing well on Ranexa and Imdur. Will continue Plavix, statin, beta blocker, ARB. He does not tolerate ASA due to GI upset.   2. HYPERLIPIDEMIA: Controlled. Continue statin and  Tricor.  3. TOBACCO USE, IN REMISSION: He has stopped smoking.  4. HTN: BP well controlled. No changes.

## 2014-03-13 ENCOUNTER — Encounter: Payer: Medicaid Other | Admitting: Cardiovascular Disease

## 2014-03-13 NOTE — Progress Notes (Signed)
History of Present Illness: 38 year old male with history of CAD with serial PCI and a 4-vessel CABG in 2009, HTN, HLD, tobacco abuse who is here today for cardiac follow up. He has been followed in the past by Dr. Riley Kill. Last cath 10/19/11 with stable CAD, bypasses. His coronary history began in 2009 with an inferior MI requiring RCA PCI and subsequent CABG. He then presented in 2010 with an NSTEMI in 2010 requiring LCx PCI. He underwent repeat coronary angiography in September of 2011 and was found to have stable native and bypass graft anatomy not requiring any intervention at the time. As above, last cath in April 2013 with stable disease. Started on Ranexa in April 2013. ( in assistance program). Admitted to [facility] May 19-21, 2014 with chest pain. Ruled out for MI with serial cardiac enzymes.   He is here today for cardiac follow up. He has been doing well. He has had no chest pain. He is walking 1.5 miles in the mornings and having no pains. 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 Breathing is at baseline. He has stopped smoking. Anxiety controlled with prn Xanax, now written in primary care.   Primary Care Physician: [facility])  Last Lipid Profile:Lipid Panel     Component Value Date/Time   CHOL 206* 09/17/2013 1200   TRIG 436* 09/17/2013 1200   HDL 35* 09/17/2013 1200   CHOLHDL 5.9 09/17/2013 1200   VLDL UNABLE TO CALCULATE IF TRIGLYCERIDE OVER 400 mg/dL 02/17/1913 7829   LDLCALC UNABLE TO CALCULATE IF TRIGLYCERIDE OVER 400 mg/dL 11/22/2128 8657    Past Medical History  Diagnosis Date  . Hypertension   . MI (myocardial infarction)   . Hx of CABG   . Heart attack   . Bronchitis   . Tobacco user   . Hyperlipidemia   . MYOCARDIAL INFARCTION 10/13/2008    Qualifier: Diagnosis of  By: Juanda Chance, MD, Johny Chess     Past Surgical History  Procedure Laterality Date  . Coronary artery bypass graft      status post diaphragmatic wall  infarction Rx BMS RCA 03-11-08   . Orthopedic surgery    . Coronary stent placement    . Laparoscopic appendectomy N/A 09/17/2013    Procedure: APPENDECTOMY LAPAROSCOPIC;  Surgeon: Shelly Rubenstein, MD;  Location: [facility] OR;  Service: General;  Laterality: N/A;  . Appendectomy      Current Outpatient Prescriptions  Medication Sig Dispense Refill  . atorvastatin (LIPITOR) 80 MG tablet Take 1 tablet (80 mg total) by mouth daily at 6 PM.  30 tablet  1  . clonazePAM (KLONOPIN) 1 MG tablet Take 0.5-1 tablets (0.5-1 mg total) by mouth 2 (two) times daily as needed for anxiety.  60 tablet  0  . clopidogrel (PLAVIX) 75 MG tablet Take 1 tablet (75 mg total) by mouth daily.  30 tablet  1  . cyclobenzaprine (FLEXERIL) 10 MG tablet Take 1 tablet (10 mg total) by mouth 3 (three) times daily as needed for muscle spasms.  90 tablet  0  . fenofibrate (TRICOR) 48 MG tablet Take 1 tablet (48 mg total) by mouth daily.  30 tablet  1  . isosorbide mononitrate (IMDUR) 30 MG 24 hr tablet Take 1 tablet (30 mg total) by mouth daily.  30 tablet  3  . losartan (COZAAR) 100 MG tablet Take 1 tablet (100 mg total) by mouth every morning.  30 tablet  3  .  metoprolol tartrate (LOPRESSOR) 25 MG tablet Take 1 tablet (25 mg total) by mouth 2 (two) times daily.  60 tablet  1  . Multiple Vitamin (MULITIVITAMIN WITH MINERALS) TABS Take 1 tablet by mouth every morning.       . nitroGLYCERIN (NITROSTAT) 0.4 MG SL tablet Place 1 tablet (0.4 mg total) under the tongue every 5 (five) minutes as needed for chest pain.  25 tablet  2  . oxyCODONE-acetaminophen (PERCOCET) 10-325 MG per tablet Take 0.5-1 tablets by mouth every 8 (eight) hours as needed for pain.  30 tablet  0  . predniSONE (DELTASONE) 50 MG tablet Take 1 tablet (50 mg total) by mouth daily.  5 tablet  0  . Probiotic Product (PRO-BIOTIC BLEND) CAPS Take 1 capsule by mouth daily. Helps with digestion      . ranolazine (RANEXA) 500 MG 12 hr tablet Take 1 tablet (500 mg total) by  mouth 2 (two) times daily.  180 tablet  3  . ranolazine (RANEXA) 500 MG 12 hr tablet Take 1 tablet (500 mg total) by mouth 2 (two) times daily.  180 tablet  1   No current facility-administered medications for this visit.    Allergies  Allergen Reactions  . Codeine Itching  . Celexa [Citalopram] Itching and Rash    History   Social History  . Marital Status: Divorced    Spouse Name: N/A    Number of Children: N/A  . Years of Education: N/A   Occupational History  . Not on file.   Social History Main Topics  . Smoking status: Former Smoker -- 0.20 packs/day for 20 years    Types: Cigarettes    Quit date: 06/15/2013  . Smokeless tobacco: Never Used     Comment: E-Cigarette  . Alcohol Use: No     Comment: maybe 3-4 drinks a week, occasionally.  . Drug Use: No     Comment: former   . Sexual Activity: Not on file   Other Topics Concern  . Not on file   Social History Narrative  . No narrative on file    Family History  Problem Relation Age of Onset  . Cancer Mother   . Cancer Sister   . Diabetes Mother   . Diabetes Sister   . Hyperlipidemia Mother   . Hypertension Mother   . Hyperlipidemia Sister   . Hypertension Sister   . Early death Mother   . Early death Sister     Review of Systems:  As stated in the HPI and otherwise negative.   There were no vitals taken for this visit.  Physical Examination: General: Well developed, well nourished, NAD HEENT: OP clear, mucus membranes moist SKIN: warm, dry. No rashes. Neuro: No focal deficits Musculoskeletal: Muscle strength 5/5 all ext Psychiatric: Mood and affect normal Neck: No JVD, no carotid bruits, no thyromegaly, no lymphadenopathy. Lungs:Clear bilaterally, no wheezes, rhonci, crackles Cardiovascular: Regular rate and rhythm. No murmurs, gallops or rubs. Abdomen:Soft. Bowel sounds present. Non-tender.  Extremities: No lower extremity edema. Pulses are 2 + in the bilateral DP/PT.  Assessment and Plan:     1. CAD: Stable. Cath April 2013 with stable severe disease s/p CABG. He is doing well on Ranexa and Imdur. Will continue Plavix, statin, beta blocker, ARB. He does not tolerate ASA due to GI upset.   2. HYPERLIPIDEMIA: Controlled. Continue statin and Tricor.   3. TOBACCO USE, IN REMISSION: He has stopped smoking.   4. HTN: BP well controlled. No changes.

## 2014-03-14 ENCOUNTER — Ambulatory Visit: Payer: Medicare Other | Admitting: Family Medicine

## 2014-04-03 ENCOUNTER — Other Ambulatory Visit: Payer: Self-pay | Admitting: Family Medicine

## 2014-04-04 ENCOUNTER — Ambulatory Visit (INDEPENDENT_AMBULATORY_CARE_PROVIDER_SITE_OTHER): Payer: Medicare Other | Admitting: Family Medicine

## 2014-04-04 ENCOUNTER — Encounter: Payer: Self-pay | Admitting: Family Medicine

## 2014-04-04 VITALS — BP 125/87 | HR 84 | Temp 98.0°F | Ht 71.0 in | Wt 270.8 lb

## 2014-04-04 DIAGNOSIS — I2510 Atherosclerotic heart disease of native coronary artery without angina pectoris: Secondary | ICD-10-CM | POA: Diagnosis not present

## 2014-04-04 DIAGNOSIS — F411 Generalized anxiety disorder: Secondary | ICD-10-CM | POA: Diagnosis not present

## 2014-04-04 DIAGNOSIS — F419 Anxiety disorder, unspecified: Secondary | ICD-10-CM

## 2014-04-04 DIAGNOSIS — M545 Low back pain, unspecified: Secondary | ICD-10-CM

## 2014-04-04 DIAGNOSIS — I209 Angina pectoris, unspecified: Secondary | ICD-10-CM

## 2014-04-04 DIAGNOSIS — I25119 Atherosclerotic heart disease of native coronary artery with unspecified angina pectoris: Secondary | ICD-10-CM

## 2014-04-04 NOTE — Assessment & Plan Note (Signed)
S/P MI, CABG, stenting. No current chest pain. BP controlled. Pt due to see Dr. Clifton James in about 1 month. Continue statin, Ranexa, Imdur, Lopressor, Cozaar.

## 2014-04-04 NOTE — Assessment & Plan Note (Signed)
A: Chronic, related to extensive multi-level intervertebral disk disease, though now recent acute issues seem to have resolved. No red flags on exam.  P: F/u as needed. No Rx's given today; pt has done well with occasional short courses of muscle relaxers and narcotics, but none needed currently. Avoid NSAIDs due to extensive cardiac disease.

## 2014-04-04 NOTE — Progress Notes (Signed)
   Subjective:    Patient ID: [name], male    DOB: 02-Sep-1975, 38 y.o.   MRN: [id number]  HPI: Pt presents to clinic for general follow-up, especially to discuss back pain.  Acute-on-chronic back pain - pt injured back about 2 months ago while helping a friend move (tried to help pick up a bed) - pain was helped significantly with Percocet and Flexeril; pt had some neck pain at that time, as well, which has mostly resolved - not currently taking any specific medications and doesn't have any specific complaints - does have some chronic lumbar dull ache, but he has tried to be more active, which has actually helped his pain  Anxiety - much improved, not currently using Klonopin very often - stress level in general has been much better - fishing helps pt relax  Cardiac - extensive heart history with hx of MI, s/p stenting and CABG - denies active chest pain, SOB, LE swelling - reports compliance with metoprolol, Plavix, atorvastatin, Tricor, Imdur, and Ranexa - sees Dr. Clifton James every six months, due for a check-up in about a month  Tobacco abuse - completely quit earlier this year and feels this has helped greatly.  Pt is a former smoker, as above In addition to the above documentation, pt's PMH, surgical history, FH, and SH all reviewed and updated where appropriate in the EMR. I have also reviewed and updated the pt's allergies and current medications as appropriate.  Review of Systems: As above. PHQ-2 negative. Otherwise, full 12-system ROS was reviewed and all negative.     Objective:   Physical Exam BP 125/87  Pulse 84  Temp(Src) 98 F (36.7 C) (Oral)  Ht  (1.803 m)  Wt 270 lb 12.8 oz (122.834 kg)  BMI 37.79 kg/m2 Gen: well-appearing adult male in NAD HEENT: [place]/AT, EOMI, PERRLA, TM's clear, MMM Cardio: RRR, no murmur appreciated Pulm: CTAB, no wheezes, normal WOB MSK: mild tenderness diffusely in low lumbar spine and in paraspinal musculature  ROM to all  extremities normal Neuro: alert / oriented, strength 5/5 throughout Skin: warm, well-perfused; numerous tattoos     Assessment & Plan:  See problem list notes.

## 2014-04-04 NOTE — Assessment & Plan Note (Signed)
A: Generally well-controlled with benzos in the past (Xanax, recently changed to Klonopin earlier this year), but not currently needing any medication at all. Denies frank issues and feels stress in general has been much better, lately.  P: Continue Klonopin PRN; no Rx given today as not needed. F/u as needed.

## 2014-04-04 NOTE — Patient Instructions (Signed)
Thank you for coming in, today!  Everything looks okay today. Keep taking your medicines without any changes. If anything does change or you need refills, give me a call.  Come back to see me in about 6 months. Keep your appointment with Dr. Clifton James. If he does NOT check labs, give me a call. If he does, I'll be able to see them. Please feel free to call with any questions or concerns at any time, at [phone]. --Dr. Casper Harrison

## 2014-04-22 ENCOUNTER — Other Ambulatory Visit: Payer: Self-pay | Admitting: Family Medicine

## 2014-05-28 ENCOUNTER — Ambulatory Visit (INDEPENDENT_AMBULATORY_CARE_PROVIDER_SITE_OTHER): Payer: Medicare Other | Admitting: Cardiovascular Disease

## 2014-05-28 ENCOUNTER — Encounter: Payer: Self-pay | Admitting: Cardiovascular Disease

## 2014-05-28 VITALS — BP 114/88 | HR 74 | Ht 70.0 in | Wt 269.0 lb

## 2014-05-28 DIAGNOSIS — I10 Essential (primary) hypertension: Secondary | ICD-10-CM

## 2014-05-28 DIAGNOSIS — E785 Hyperlipidemia, unspecified: Secondary | ICD-10-CM | POA: Diagnosis not present

## 2014-05-28 DIAGNOSIS — Z720 Tobacco use: Secondary | ICD-10-CM

## 2014-05-28 DIAGNOSIS — I2510 Atherosclerotic heart disease of native coronary artery without angina pectoris: Secondary | ICD-10-CM

## 2014-05-28 NOTE — Progress Notes (Signed)
History of Present Illness: 38 year old male with history of CAD with serial PCI and a 4-vessel CABG in 2009, HTN, HLD, tobacco abuse who is here today for cardiac follow up. [facility] with chest pain on 10/19/11 and [facility] per Dr. Riley KillStuckey. Cardiac cath on 10/19/11 with stable CAD, bypasses. He was discharged home on Imdur. His coronary history began in 2009 with an inferior MI requiring RCA PCI and subsequent CABG. He then presented in 2010 with an NSTEMI in 2010 requiring LCx PCI. He underwent repeat coronary angiography in September of 2011 and was found to have stable native and bypass graft anatomy not requiring any intervention at the time. As above, last cath in April 2013 with stable disease. Started on Ranexa in April 2013. ( in assistance program). Ad[facility] 19[facility]21, 2014 with chest pain. Ruled out for MI with serial cardiac enzymes. He was last seen January 2015 in our office and was doing well. Admitted with acute appendicitis March 2015.   He is here today for cardiac follow up. He has been doing well. He has had no chest pain. He is walking over one mile per day. 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 Breathing is at baseline. He is only smoking 5 cigarettes per day. Anxiety controlled with prn Xanax, now written in primary care.   Primary Care Physician: [facility]  Last Lipid Profile:Lipid Panel     Component Value Date/Time   CHOL 206* 09/17/2013 1200   TRIG 436* 09/17/2013 1200   HDL 35* 09/17/2013 1200   CHOLHDL 5.9 09/17/2013 1200   VLDL UNABLE TO CALCULATE IF TRIGLYCERIDE OVER 400 mg/dL 16/10/960403/07/2013 54091200   LDLCALC UNABLE TO CALCULATE IF TRIGLYCERIDE OVER 400 mg/dL 81/19/147803/07/2013 29561200    Past Medical History  Diagnosis Date  . Hypertension   . MI (myocardial infarction)   . Hx of CABG   . Heart attack   . Bronchitis   . Tobacco user   .  Hyperlipidemia   . MYOCARDIAL INFARCTION 10/13/2008    Qualifier: Diagnosis of  By: Juanda ChanceBrodie, MD, Johny ChessFACC, Bruce Rogers     Past Surgical History  Procedure Laterality Date  . Coronary artery bypass graft      status post diaphragmatic wall infarction Rx BMS RCA 03-11-08   . Orthopedic surgery    . Coronary stent placement    . Laparoscopic appendectomy N/A 09/17/2013    Procedure: APPENDECTOMY LAPAROSCOPIC;  Surgeon: Shelly Rubensteinouglas A Blackman, MD;  Location: [facility] OR;  Service: General;  Laterality: N/A;  . Appendectomy      Current Outpatient Prescriptions  Medication Sig Dispense Refill  . atorvastatin (LIPITOR) 80 MG tablet TAKE ONE TABLET BY MOUTH ONCE DAILY AT  6PM 30 tablet 2  . clopidogrel (PLAVIX) 75 MG tablet Take 1 tablet (75 mg total) by mouth daily. 30 tablet 1  . fenofibrate (TRICOR) 48 MG tablet TAKE ONE TABLET BY MOUTH ONCE DAILY 30 tablet 2  . isosorbide mononitrate (IMDUR) 30 MG 24 hr tablet TAKE ONE TABLET BY MOUTH ONCE DAILY 30 tablet 3  . losartan (COZAAR) 100 MG tablet Take 1 tablet (100 mg total) by mouth every morning. 30 tablet 3  . metoprolol tartrate (LOPRESSOR) 25 MG tablet TAKE ONE TABLET BY MOUTH TWICE DAILY 60 tablet 2  . Multiple Vitamin (MULITIVITAMIN WITH MINERALS) TABS Take 1 tablet by mouth every morning.     .Marland Kitchen  nitroGLYCERIN (NITROSTAT) 0.4 MG SL tablet Place 1 tablet (0.4 mg total) under the tongue every 5 (five) minutes as needed for chest pain. 25 tablet 2  . Probiotic Product (PRO-BIOTIC BLEND) CAPS Take 1 capsule by mouth daily. Helps with digestion    . RANEXA 500 MG 12 hr tablet TAKE ONE TABLET BY MOUTH TWICE DAILY 180 tablet 2   No current facility-administered medications for this visit.    Allergies  Allergen Reactions  . Codeine Itching  . Celexa [Citalopram] Itching and Rash    History   Social History  . Marital Status: Divorced    Spouse Name: N/A    Number of Children: N/A  . Years of Education: N/A   Occupational History  . Not on file.     Social History Main Topics  . Smoking status: Former Smoker -- 0.20 packs/day for 20 years    Types: Cigarettes    Quit date: 06/15/2013  . Smokeless tobacco: Never Used     Comment: E-Cigarette  . Alcohol Use: No     Comment: maybe 3-4 drinks a week, occasionally.  . Drug Use: No     Comment: former   . Sexual Activity: Not on file   Other Topics Concern  . Not on file   Social History Narrative    Family History  Problem Relation Age of Onset  . Cancer Mother   . Cancer Sister   . Diabetes Mother   . Diabetes Sister   . Hyperlipidemia Mother   . Hypertension Mother   . Hyperlipidemia Sister   . Hypertension Sister   . Early death Mother   . Early death Sister     Review of Systems:  As stated in the HPI and otherwise negative.   BP 114/88 mmHg  Pulse 74  Ht 5\' 10"  (1.778 m)  Wt 269 lb (122.018 kg)  BMI 38.60 kg/m2  Physical Examination: General: Well developed, well nourished, NAD HEENT: OP clear, mucus membranes moist SKIN: warm, dry. No rashes. Neuro: No focal deficits Musculoskeletal: Muscle strength 5/5 all ext Psychiatric: Mood and affect normal Neck: No JVD, no carotid bruits, no thyromegaly, no lymphadenopathy. Lungs:Clear bilaterally, no wheezes, rhonci, crackles Cardiovascular: Regular rate and rhythm. No murmurs, gallops or rubs. Abdomen:Soft. Bowel sounds present. Non-tender.  Extremities: No lower extremity edema. Pulses are 2 + in the bilateral DP/PT.  Assessment and Plan:   1. CAD: Stable. Cath April 2013 with stable severe disease s/p CABG. He is doing well on Ranexa and Imdur. Will continue Plavix, statin, beta blocker, ARB. He does not tolerate ASA due to GI upset.    2. HYPERLIPIDEMIA/Hypertriglyceridemia. Continue statin and Tricor. Repeat March 2015.   3. TOBACCO USE: He has started back smoking. Smoking cessation counseling today.   4. HTN: BP well controlled. No changes.

## 2014-05-28 NOTE — Patient Instructions (Signed)
Your physician wants you to follow-up in:  6 months. You will receive a reminder letter in the mail two months in advance. If you don't receive a letter, please call our office to schedule the follow-up appointment.   

## 2014-06-07 ENCOUNTER — Encounter: Payer: Self-pay | Admitting: Family Medicine

## 2014-06-07 ENCOUNTER — Ambulatory Visit (INDEPENDENT_AMBULATORY_CARE_PROVIDER_SITE_OTHER): Payer: Medicare Other | Admitting: Family Medicine

## 2014-06-07 VITALS — BP 118/80 | HR 75 | Temp 98.0°F | Ht 70.0 in | Wt 266.8 lb

## 2014-06-07 DIAGNOSIS — E782 Mixed hyperlipidemia: Secondary | ICD-10-CM

## 2014-06-07 DIAGNOSIS — I25119 Atherosclerotic heart disease of native coronary artery with unspecified angina pectoris: Secondary | ICD-10-CM | POA: Diagnosis not present

## 2014-06-07 DIAGNOSIS — Z720 Tobacco use: Secondary | ICD-10-CM | POA: Diagnosis not present

## 2014-06-07 DIAGNOSIS — I2510 Atherosclerotic heart disease of native coronary artery without angina pectoris: Secondary | ICD-10-CM

## 2014-06-07 DIAGNOSIS — M545 Low back pain: Secondary | ICD-10-CM

## 2014-06-07 DIAGNOSIS — F419 Anxiety disorder, unspecified: Secondary | ICD-10-CM | POA: Diagnosis not present

## 2014-06-07 DIAGNOSIS — F17200 Nicotine dependence, unspecified, uncomplicated: Secondary | ICD-10-CM

## 2014-06-07 MED ORDER — CLONAZEPAM 1 MG PO TABS: 0.5000 mg | ORAL_TABLET | Freq: Two times a day (BID) | ORAL | Status: DC | PRN

## 2014-06-07 NOTE — Progress Notes (Signed)
   Subjective:    Patient ID: [name], male    DOB: 01/30/1976, 38 y.o.   MRN: [id number]  HPI: Pt presents to clinic for general follow-up, especially to discuss back pain.  Chronic back pain - currently not bothersome; hurt his back a couple of months ago helping a friend, which has resolved - not currently taking any specific medications and doesn't have any specific complaints, and does not request medications - does have some chronic lumbar dull ache, but he has tried to be more active, which has actually helped his pain - pt has lost about 20 lbs in the past year, and is drinking more water, eating less "junk food," etc, all of which has helped  Anxiety - worse with holiday season, and pt has started smoking again, 5 cigarettes per day or less - stress level in general has been much better, and 60 tablets of Klonopin lasted pt almost 9 months - pt does request refill of Klonopin  Cardiac - extensive heart history with hx of MI, s/p stenting and CABG - denies active chest pain, SOB, LE swelling - reports compliance with metoprolol, Plavix, atorvastatin, Tricor, Imdur, and Ranexa - recently saw Dr. Clifton JamesMcAlhany, with no blood drawn; requests check of liver and kidney function  Tobacco abuse - completely quit earlier this year, but restarted again, as above, mostly when he gets "nerves"   In addition to the above documentation, pt's PMH, surgical history, FH, and SH all reviewed and updated where appropriate in the EMR. I have also reviewed and updated the pt's allergies and current medications as appropriate.  Review of Systems: As above. PHQ-2 negative. Otherwise, full 12-system ROS was reviewed and all negative.     Objective:   Physical Exam BP 118/80 mmHg  Pulse 75  Temp(Src) 98 F (36.7 C) (Oral)  Ht 5\' 10"  (1.778 m)  Wt 266 lb 12.8 oz (121.02 kg)  BMI 38.28 kg/m2 Gen: well-appearing adult male in NAD HEENT: [place]/AT, EOMI, PERRLA, TM's clear, MMM Cardio: RRR, no  murmur appreciated Pulm: CTAB, no wheezes, normal WOB MSK: mild tenderness diffusely in low lumbar spine and in paraspinal musculature  ROM to all extremities normal Neuro: alert / oriented, strength 5/5 throughout Skin: warm, well-perfused; numerous tattoos     Assessment & Plan:  See problem list notes.

## 2014-06-07 NOTE — Assessment & Plan Note (Signed)
Smoking a small amount of cigarettes per day again, and has had little success with various types of assistance, declining formal cessation assistance / Rx / etc, today. Tried e-cigarettes / vapor device in the past but felt it was more bothersome than smoking. See anxiety problem list note; recommended complete cessation and will continue to counsel routinely at f/u.

## 2014-06-07 NOTE — Patient Instructions (Signed)
Thank you for coming in, today!  We'll refill your Klonopin, today. Try to use it as little as possible. Try to stay away from smoking as much as possible.  We'll check your liver and your kidney function, today. I'll send you a letter or call you with your results.  Make sure you follow up with Dr. Clifton JamesMcAlhany like he says. Come back to see me sometime in the next 6 months or so. If you need to see me sooner, come back any time.  Please feel free to call with any questions or concerns at any time, at [phone]. --Dr. Casper HarrisonStreet

## 2014-06-07 NOTE — Assessment & Plan Note (Signed)
A: Chronic back pain currently mostly quiescent, related to multi-level intervertebral disk disease, without red flags on exam or per history.  P: F/u as needed, no Rx's given today. Avoid NSAIDs due to extensive cardiac disease.

## 2014-06-07 NOTE — Assessment & Plan Note (Signed)
A: Generally well-controlled with benzodiazepines, with Klonopin 0.5-1 mg BID PRN #60 lasting 9+ months. Requests refill on this. Denies frank depression and feels well in general, but has increased stress "running and doing, go-go-go" with the holiday season. Has started smoking again, and feels Klonopin will hopefully help him smoke less, as he typically smokes when he is anxious.  P: Refilled Klonopin, #60 with 1 refill, with instructions to use 0.5-1 mg BID PRN, as before. Instructed pt that this should last at minimum 2 months, and if he feels like he is using it that often, to return to clinic to discuss other options. Pt voiced understanding and stated, "That should last me a year or more."

## 2014-06-07 NOTE — Assessment & Plan Note (Signed)
Compliant with Lipitor and Tricor, without new / different side effects. Check CMP today. Continue current meds, likely repeat lipid panel in March or later (next cardiology f/u or with me).

## 2014-06-07 NOTE — Assessment & Plan Note (Signed)
Extensive cardiac history, s/p MI, CABG, stenting, without current chest pain and with BP controlled. Following closely with Dr. Clifton JamesMcAlhany. On Lipitor, Ranexa, Imdur, Lopressor, Cozaar. Checking CMP for liver and kidney function, today. Likely will get repeat lipid panel at cardiology appointment in March. F/u with me / cardiology PRN and as directed.

## 2014-06-08 ENCOUNTER — Encounter: Payer: Self-pay | Admitting: Family Medicine

## 2014-06-08 LAB — COMPREHENSIVE METABOLIC PANEL
ALK PHOS: 56 U/L (ref 39–117)
ALT: 19 U/L (ref 0–53)
AST: 18 U/L (ref 0–37)
Albumin: 4.5 g/dL (ref 3.5–5.2)
BUN: 10 mg/dL (ref 6–23)
CHLORIDE: 102 meq/L (ref 96–112)
CO2: 23 meq/L (ref 19–32)
Calcium: 10.1 mg/dL (ref 8.4–10.5)
Creat: 0.96 mg/dL (ref 0.50–1.35)
Glucose, Bld: 99 mg/dL (ref 70–99)
Potassium: 4.5 mEq/L (ref 3.5–5.3)
SODIUM: 139 meq/L (ref 135–145)
Total Bilirubin: 0.5 mg/dL (ref 0.2–1.2)
Total Protein: 7 g/dL (ref 6.0–8.3)

## 2014-06-28 ENCOUNTER — Encounter (HOSPITAL_COMMUNITY): Payer: Self-pay | Admitting: Cardiology

## 2014-07-06 ENCOUNTER — Other Ambulatory Visit: Payer: Self-pay | Admitting: Family Medicine

## 2014-07-23 ENCOUNTER — Encounter (HOSPITAL_COMMUNITY): Payer: Self-pay | Admitting: Emergency Medicine

## 2014-07-23 ENCOUNTER — Emergency Department (HOSPITAL_COMMUNITY)
Admission: EM | Admit: 2014-07-23 | Discharge: 2014-07-23 | Disposition: A | Discharge status: Home / Self Care | Payer: Medicare Other | Attending: Emergency Medicine | Admitting: Emergency Medicine

## 2014-07-23 ENCOUNTER — Emergency Department (HOSPITAL_COMMUNITY): Payer: Medicare Other

## 2014-07-23 DIAGNOSIS — Z951 Presence of aortocoronary bypass graft: Secondary | ICD-10-CM | POA: Insufficient documentation

## 2014-07-23 DIAGNOSIS — Y9289 Other specified places as the place of occurrence of the external cause: Secondary | ICD-10-CM | POA: Diagnosis not present

## 2014-07-23 DIAGNOSIS — S3991XA Unspecified injury of abdomen, initial encounter: Secondary | ICD-10-CM | POA: Diagnosis present

## 2014-07-23 DIAGNOSIS — Y9389 Activity, other specified: Secondary | ICD-10-CM | POA: Insufficient documentation

## 2014-07-23 DIAGNOSIS — I252 Old myocardial infarction: Secondary | ICD-10-CM | POA: Diagnosis not present

## 2014-07-23 DIAGNOSIS — Y998 Other external cause status: Secondary | ICD-10-CM | POA: Diagnosis not present

## 2014-07-23 DIAGNOSIS — E785 Hyperlipidemia, unspecified: Secondary | ICD-10-CM | POA: Insufficient documentation

## 2014-07-23 DIAGNOSIS — T1490XA Injury, unspecified, initial encounter: Secondary | ICD-10-CM

## 2014-07-23 DIAGNOSIS — S301XXA Contusion of abdominal wall, initial encounter: Secondary | ICD-10-CM | POA: Diagnosis not present

## 2014-07-23 DIAGNOSIS — Z9861 Coronary angioplasty status: Secondary | ICD-10-CM | POA: Insufficient documentation

## 2014-07-23 DIAGNOSIS — Z720 Tobacco use: Secondary | ICD-10-CM | POA: Diagnosis not present

## 2014-07-23 DIAGNOSIS — N200 Calculus of kidney: Secondary | ICD-10-CM | POA: Diagnosis not present

## 2014-07-23 DIAGNOSIS — Z8709 Personal history of other diseases of the respiratory system: Secondary | ICD-10-CM | POA: Insufficient documentation

## 2014-07-23 DIAGNOSIS — Z79899 Other long term (current) drug therapy: Secondary | ICD-10-CM | POA: Diagnosis not present

## 2014-07-23 DIAGNOSIS — W01198A Fall on same level from slipping, tripping and stumbling with subsequent striking against other object, initial encounter: Secondary | ICD-10-CM | POA: Diagnosis not present

## 2014-07-23 DIAGNOSIS — S6990XA Unspecified injury of unspecified wrist, hand and finger(s), initial encounter: Secondary | ICD-10-CM | POA: Diagnosis not present

## 2014-07-23 DIAGNOSIS — R0781 Pleurodynia: Secondary | ICD-10-CM

## 2014-07-23 DIAGNOSIS — Z7902 Long term (current) use of antithrombotics/antiplatelets: Secondary | ICD-10-CM | POA: Insufficient documentation

## 2014-07-23 DIAGNOSIS — I10 Essential (primary) hypertension: Secondary | ICD-10-CM | POA: Insufficient documentation

## 2014-07-23 DIAGNOSIS — R1011 Right upper quadrant pain: Secondary | ICD-10-CM | POA: Diagnosis not present

## 2014-07-23 LAB — COMPREHENSIVE METABOLIC PANEL
ALT: 18 U/L (ref 0–53)
AST: 20 U/L (ref 0–37)
Albumin: 4.6 g/dL (ref 3.5–5.2)
Alkaline Phosphatase: 62 U/L (ref 39–117)
Anion gap: 8 (ref 5–15)
BUN: 10 mg/dL (ref 6–23)
CHLORIDE: 109 meq/L (ref 96–112)
CO2: 23 mmol/L (ref 19–32)
CREATININE: 0.94 mg/dL (ref 0.50–1.35)
Calcium: 9.4 mg/dL (ref 8.4–10.5)
GFR calc non Af Amer: >90 mL/min (ref >90)
Glucose, Bld: 97 mg/dL (ref 70–99)
Potassium: 3.9 mmol/L (ref 3.5–5.1)
Sodium: 140 mmol/L (ref 135–145)
Total Bilirubin: 0.9 mg/dL (ref 0.3–1.2)
Total Protein: 7.7 g/dL (ref 6.0–8.3)

## 2014-07-23 LAB — URINALYSIS, ROUTINE W REFLEX MICROSCOPIC
Bilirubin Urine: NEGATIVE
Glucose, UA: NEGATIVE mg/dL
HGB URINE DIPSTICK: NEGATIVE
Ketones, ur: NEGATIVE mg/dL
Leukocytes, UA: NEGATIVE
NITRITE: NEGATIVE
Protein, ur: NEGATIVE mg/dL
SPECIFIC GRAVITY, URINE: 1.018 (ref 1.005–1.030)
UROBILINOGEN UA: 1 mg/dL (ref 0.0–1.0)
pH: 6 (ref 5.0–8.0)

## 2014-07-23 LAB — CBC WITH DIFFERENTIAL/PLATELET
Basophils Absolute: 0 10*3/uL (ref 0.0–0.1)
Basophils Relative: 0 % (ref 0–1)
Eosinophils Absolute: 0.4 10*3/uL (ref 0.0–0.7)
Eosinophils Relative: 4 % (ref 0–5)
HCT: 47.7 % (ref 39.0–52.0)
Hemoglobin: 16.4 g/dL (ref 13.0–17.0)
LYMPHS ABS: 2.8 10*3/uL (ref 0.7–4.0)
Lymphocytes Relative: 29 % (ref 12–46)
MCH: 32.6 pg (ref 26.0–34.0)
MCHC: 34.4 g/dL (ref 30.0–36.0)
MCV: 94.8 fL (ref 78.0–100.0)
MONOS PCT: 6 % (ref 3–12)
Monocytes Absolute: 0.6 10*3/uL (ref 0.1–1.0)
NEUTROS ABS: 5.9 10*3/uL (ref 1.7–7.7)
NEUTROS PCT: 61 % (ref 43–77)
Platelets: 236 10*3/uL (ref 150–400)
RBC: 5.03 MIL/uL (ref 4.22–5.81)
RDW: 13.4 % (ref 11.5–15.5)
WBC: 9.7 10*3/uL (ref 4.0–10.5)

## 2014-07-23 LAB — PROTIME-INR
INR: 0.91 (ref 0.00–1.49)
PROTHROMBIN TIME: 12.4 s (ref 11.6–15.2)

## 2014-07-23 MED ORDER — HYDROMORPHONE HCL 1 MG/ML IJ SOLN
0.5000 mg | Freq: Once | INTRAMUSCULAR | Status: AC
  Administered 2014-07-23: 0.5 mg via INTRAVENOUS
  Filled 2014-07-23: qty 1

## 2014-07-23 MED ORDER — CYCLOBENZAPRINE HCL 10 MG PO TABS: 10.0000 mg | ORAL_TABLET | Freq: Two times a day (BID) | ORAL | Status: DC | PRN

## 2014-07-23 MED ORDER — OXYCODONE-ACETAMINOPHEN 5-325 MG PO TABS: 1.0000 | ORAL_TABLET | ORAL | Status: DC | PRN

## 2014-07-23 MED ORDER — IOHEXOL 300 MG/ML  SOLN
100.0000 mL | Freq: Once | INTRAMUSCULAR | Status: AC | PRN
  Administered 2014-07-23: 100 mL via INTRAVENOUS

## 2014-07-23 MED ORDER — ONDANSETRON HCL 4 MG/2ML IJ SOLN
4.0000 mg | Freq: Once | INTRAMUSCULAR | Status: AC
  Administered 2014-07-23: 4 mg via INTRAVENOUS
  Filled 2014-07-23: qty 2

## 2014-07-23 NOTE — ED Notes (Signed)
Awake. Verbally responsive. A/O x4. Resp even and unlabored. No audible adventitious breath sounds noted. ABC's intact. NAD noted. 

## 2014-07-23 NOTE — ED Provider Notes (Signed)
MSE was initiated and I personally evaluated the patient and placed orders (if any) at  6:04 PM on July 23, 2014.  [name] is a 39 y/o M with PMHx of hypertension, MI, CABG in 2009, bronchitis, hyperlipidemia, left heart catheterization performed April 2013 presenting to the ED with right rib pain after a fall that occurred approximately 2 days ago. Patient reported that he tripped over a chair and fell over the iron railing 2 days ago and fell approximate 5 feet off the deck. Patient reported that he does not think he hit his head, but stated that he hit his neck. Patient reported that he's been having pain on his right rib with a railing contacted him. Patient reported he is having right abdominal pain as well. Stated the pain worsens with deep inhalation. Patient reported that he is unable to lay on the right side secondary to pain, reported that he has a popping sensation. Denied headache, blurred vision, sudden loss of vision, neck stiffness, nausea, vomiting, loss of consciousness, chest pain, back pain, numbness, tingling, urinary and bowel incontinence. Patient currently on Plavix. PCP Dr. Casper Harrison Cardiologist Dr. Clifton James  Alert and oriented. GCS 15. Negative signs of head trauma. Patient follows commands well. Negative pain upon palpation to C-spine. Full range of motion to upper extremities without difficulty-equal grip strength. Negative tenting of the clavicles. Negative ecchymosis identified to the right rib. Discomfort upon palpation to the mid axillary right side of the ribs as well as right aspect of the thoracic region. Negative abdominal distention. Bowel sounds normoactive in all 4 quadrants. Exquisite tenderness identified to the right upper quadrant and right lower quadrant. Full range of motion to lower extremities. Gait proper-negative step-offs or sway.  Discussed case in great detail with attending physician, Dr. Freida Busman. Agreed with plan to move patient from fast track to  main ED for further workup. Agreed with plan for CT scan to rule out possible internal bleed secondary to exquisite pain even after 2 days after the event. Orders for labs and imaging have been placed. Patient to be moved to the main ED for further assessment to be performed.  The patient appears stable so that the remainder of the MSE may be completed by another provider.  Raymon Mutton, PA-C 07/23/14 1959  Toy Baker, MD 07/26/14 1323

## 2014-07-23 NOTE — ED Notes (Signed)
Per pt, states he flipped over a chair and landed on his right side-having right rib cage pain

## 2014-07-23 NOTE — ED Provider Notes (Signed)
CSN: [id number]     Arrival date & time 07/23/14  1637 History   First MD Initiated Contact with Patient 07/23/14 1710     Chief Complaint  Patient presents with  . rib cage injury      (Consider location/radiation/quality/duration/timing/severity/associated sxs/prior Treatment) HPI [name] is a 39 y.o. male who presents to ED with complaint of right upper abdominal right flank pain. Patient states he fell 2 days ago off of sports after tripping over a chair. States he fell down approximately 5 feet. When he fell he hit his right side on a railing. He reports landing on his back. States he did hit his head, however he denies loss of consciousness or headache or neck pain since then. He denies any confusion, no memory loss, no nausea or vomiting, no dizziness. He states his main pain is in the right upper abdomen and right flank. Denies any rib cage pain. He denies any shortness of breath. He states pain is worsened with movement. He is not taking any medications for this. He denies any nausea, vomiting. No blood in his urine. No urinary problems. No changes in his bowels. States he unable to sleep at night or move without difficulty.  Past Medical History  Diagnosis Date  . Hypertension   . MI (myocardial infarction)   . Hx of CABG   . Heart attack   . Bronchitis   . Tobacco user   . Hyperlipidemia   . MYOCARDIAL INFARCTION 10/13/2008    Qualifier: Diagnosis of  By: Juanda Chance, MD, Johny Chess    Past Surgical History  Procedure Laterality Date  . Coronary artery bypass graft      status post diaphragmatic wall infarction Rx BMS RCA 03-11-08   . Orthopedic surgery    . Coronary stent placement    . Laparoscopic appendectomy N/A 09/17/2013    Procedure: APPENDECTOMY LAPAROSCOPIC;  Surgeon: Shelly Rubenstein, MD;  Location: [facility] OR;  Service: General;  Laterality: N/A;  . Appendectomy    . Left heart catheterization with coronary/graft angiogram N/A 10/19/2011    Procedure: LEFT  HEART CATHETERIZATION WITH Isabel Caprice;  Surgeon: Herby Abraham, MD;  Location: [facility] CATH LAB;  Service: Cardiovascular;  Laterality: N/A;   Family History  Problem Relation Age of Onset  . Cancer Mother   . Cancer Sister   . Diabetes Mother   . Diabetes Sister   . Hyperlipidemia Mother   . Hypertension Mother   . Hyperlipidemia Sister   . Hypertension Sister   . Early death Mother   . Early death Sister    History  Substance Use Topics  . Smoking status: Light Tobacco Smoker -- 0.20 packs/day for 20 years    Types: Cigarettes  . Smokeless tobacco: Never Used     Comment: E-Cigarette  . Alcohol Use: No     Comment: maybe 3-4 drinks a week, occasionally.    Review of Systems  Constitutional: Negative for fever and chills.  Respiratory: Negative for cough, chest tightness and shortness of breath.   Cardiovascular: Negative for chest pain, palpitations and leg swelling.  Gastrointestinal: Positive for abdominal pain. Negative for nausea, vomiting, diarrhea and abdominal distention.  Genitourinary: Positive for flank pain. Negative for dysuria, urgency, frequency and hematuria.  Musculoskeletal: Negative for myalgias, arthralgias, neck pain and neck stiffness.  Skin: Negative for rash.  Allergic/Immunologic: Negative for immunocompromised state.  Neurological: Negative for dizziness, weakness, light-headedness, numbness and headaches.  All other systems reviewed and are  negative.     Allergies  Codeine and Celexa  Home Medications   Prior to Admission medications   Medication Sig Start Date End Date Taking? Authorizing Provider  atorvastatin (LIPITOR) 80 MG tablet TAKE ONE TABLET BY MOUTH ONCE DAILY AT  6PM 04/23/14  Yes Stephanie Coup Street, MD  clonazePAM (KLONOPIN) 1 MG tablet Take 0.5-1 tablets (0.5-1 mg total) by mouth 2 (two) times daily as needed for anxiety. 06/07/14  Yes Stephanie Coup Street, MD  clopidogrel (PLAVIX) 75 MG tablet Take 1 tablet (75 mg  total) by mouth daily.   Yes Stephanie Coup Street, MD  fenofibrate (TRICOR) 48 MG tablet TAKE ONE TABLET BY MOUTH ONCE DAILY 04/23/14  Yes Stephanie Coup Street, MD  isosorbide mononitrate (IMDUR) 30 MG 24 hr tablet TAKE ONE TABLET BY MOUTH ONCE DAILY 04/03/14  Yes Stephanie Coup Street, MD  losartan (COZAAR) 100 MG tablet Take 1 tablet (100 mg total) by mouth every morning. 08/22/13 08/22/14 Yes Stephanie Coup Street, MD  metoprolol tartrate (LOPRESSOR) 25 MG tablet TAKE ONE TABLET BY MOUTH TWICE DAILY 04/23/14  Yes Stephanie Coup Street, MD  Multiple Vitamin (MULITIVITAMIN WITH MINERALS) TABS Take 1 tablet by mouth every morning.    Yes Historical Provider, MD  nitroGLYCERIN (NITROSTAT) 0.4 MG SL tablet Place 1 tablet (0.4 mg total) under the tongue every 5 (five) minutes as needed for chest pain. 08/08/13  Yes Laurey Morale, MD  Probiotic Product (PRO-BIOTIC BLEND) CAPS Take 1 capsule by mouth daily. Helps with digestion   Yes Historical Provider, MD  RANEXA 500 MG 12 hr tablet TAKE ONE TABLET BY MOUTH TWICE DAILY 04/23/14  Yes Stephanie Coup Street, MD  losartan (COZAAR) 100 MG tablet TAKE ONE-HALF TABLET BY MOUTH TWICE DAILY Patient not taking: Reported on 07/23/2014 07/09/14   Stephanie Coup Street, MD   BP 134/88 mmHg  Pulse 70  Temp(Src) 97.6 F (36.4 C) (Oral)  Resp 20  SpO2 100% Physical Exam  Constitutional: He appears well-developed and well-nourished. No distress.  HENT:  Head: Normocephalic and atraumatic.  Eyes: Conjunctivae are normal.  Neck: Neck supple.  Cardiovascular: Normal rate, regular rhythm and normal heart sounds.   Pulmonary/Chest: Effort normal. No respiratory distress. He has no wheezes. He has no rales. He exhibits no tenderness.  No chest wall or rib tenderness. No crepitus  Abdominal: Soft. Bowel sounds are normal. He exhibits no distension. There is tenderness. There is no rebound.  No bruising. Tender to palpation in RUQ  Musculoskeletal: He exhibits no edema.   Neurological: He is alert.  Skin: Skin is warm and dry.  Nursing note and vitals reviewed.   ED Course  Procedures (including critical care time) Labs Review Labs Reviewed  CBC WITH DIFFERENTIAL  COMPREHENSIVE METABOLIC PANEL  PROTIME-INR  URINALYSIS, ROUTINE W REFLEX MICROSCOPIC    Imaging Review Dg Chest 2 View  07/23/2014   CLINICAL DATA:  Initial encounter for tripped over chair flipping over deck railing, hitting the railing all the way down and landing on the ground on his right side. This occurred 2 days ago.  EXAM: CHEST  2 VIEW  COMPARISON:  09/17/2013  FINDINGS: The lungs are clear without focal infiltrate, edema, pneumothorax or pleural effusion. The cardiopericardial silhouette is within normal limits for size. Patient is status post CABG. Imaged bony structures of the thorax are intact.  IMPRESSION: No active cardiopulmonary disease.   Electronically Signed   By: Kennith Center M.D.   On: 07/23/2014 18:13  EKG Interpretation None      MDM   Final diagnoses:  Trauma  Abdominal contusion, initial encounter    Pt here after a fall 2 days ago. Complaining of Pain to the right upper abdomen. On exam, patient does not have any significant rib cage tenderness, his pain is mainly in the right upper quadrant. There is no guarding or peritoneal signs on abdominal exam, however patient does have significant tenderness in that area. He is requesting pain medications, Dilaudid ordered for his pain. Given the mechanism of fall and severity of the pain will get a CT of the abdomen and pelvis. This will extend up to the mid chest area. Patient's lab work is unremarkable.   Patient's CT is negative. Most likely contusion. Will treat with Percocet, Flexeril. Imaging of the head obtained, given injury occurred 2 days ago, no loss of consciousness, no other associated symptoms, cleared by [place] CT will. Instructed to follow up closely with primary care doctor.  Filed Vitals:    07/23/14 2100 07/23/14 2109 07/23/14 2130 07/23/14 2214  BP: 119/73 119/73 121/85 133/88  Pulse: 62 84 71 74  Temp:      TempSrc:      Resp:  16  18  SpO2: 95% 94% 93% 97%      Lottie Mussel, PA-C 07/24/14 0056  Toy Baker, MD 07/26/14 1322

## 2014-07-23 NOTE — Discharge Instructions (Signed)
Take percocet for severe pain as prescribed as needed. Flexeril for for spasms. Follow up with your doctor if not improving.   Blunt Abdominal Trauma A blunt injury to the abdomen can cause pain. The pain is most likely from bruising and stretching of your muscles. This pain is often made worse with movement. Most often these injuries are not serious and get better within 1 week with rest and mild pain medicine. However, internal organs (liver, spleen, kidneys) can be injured with blunt trauma. If you do not get better or if you get worse, further examination may be needed. Continue with your regular daily activities, but avoid any strenuous activities until your pain is improved. If your stomach is upset, stick to a clear liquid diet and slowly advance to solid food.  SEEK IMMEDIATE MEDICAL CARE IF:   You develop increasing pain, nausea, or repeated vomiting.  You develop chest pain or breathing difficulty.  You develop blood in the urine, vomit, or stool.  You develop weakness, fainting, fever, or other serious complaints. Document Released: 08/13/2004 Document Revised: 09/28/2011 Document Reviewed: 11/29/2008 [facility] Patient Information 2015 [place], [place]. This information is not intended to replace advice given to you by your health care provider. Make sure you discuss any questions you have with your health care provider.

## 2014-07-30 ENCOUNTER — Other Ambulatory Visit: Payer: Self-pay | Admitting: Family Medicine

## 2014-08-06 ENCOUNTER — Ambulatory Visit (INDEPENDENT_AMBULATORY_CARE_PROVIDER_SITE_OTHER): Payer: Medicare Other | Admitting: Family Medicine

## 2014-08-06 ENCOUNTER — Encounter: Payer: Self-pay | Admitting: Family Medicine

## 2014-08-06 VITALS — BP 139/90 | HR 80 | Ht 70.0 in | Wt 264.0 lb

## 2014-08-06 DIAGNOSIS — S20211D Contusion of right front wall of thorax, subsequent encounter: Secondary | ICD-10-CM

## 2014-08-06 DIAGNOSIS — N200 Calculus of kidney: Secondary | ICD-10-CM | POA: Insufficient documentation

## 2014-08-06 DIAGNOSIS — M62838 Other muscle spasm: Secondary | ICD-10-CM

## 2014-08-06 HISTORY — DX: Calculus of kidney: N20.0

## 2014-08-06 MED ORDER — OXYCODONE-ACETAMINOPHEN 5-325 MG PO TABS: 1.0000 | ORAL_TABLET | Freq: Three times a day (TID) | ORAL | Status: DC | PRN

## 2014-08-06 MED ORDER — MELOXICAM 7.5 MG PO TABS: 7.5000 mg | ORAL_TABLET | Freq: Every day | ORAL | Status: DC

## 2014-08-06 MED ORDER — METHOCARBAMOL 500 MG PO TABS: 500.0000 mg | ORAL_TABLET | Freq: Three times a day (TID) | ORAL | Status: DC

## 2014-08-06 NOTE — Progress Notes (Signed)
   Subjective:    Patient ID: [name], male    DOB: 08/22/1975, 39 y.o.   MRN: [id number]  HPI: Pt presents to clinic for right side / rib pain after a fall against a railing on New Year's Eve (almost 3 weeks ago); pt was seen in the ED on 1/4 for this, with CXR and abdominal CT showing no acute abnormalities. Pt reports he was [place] a friend[place] house [place] [employer] [place], and he tripped over some wrought-iron deck furniture, hit his side hard against the railing of the deck, then fell over the railing onto his back on the ground (the deck is described as roughly 4 feet high). His pain is described as a constant, aching soreness, with stabbing if he moves in such a way as to "aggravate" it. His pain is around his right inferior costal margin, very tender to the touch, worse with some movements (twisting, putting pressure on his right side, etc) and with lying on his right side at night. His pain does not radiate to his back. He had some skin bruising of the overlying skin, which has resolved, but the pain is still present "in his ribs." Pt describes a "popping" sensation, at times, but cannot specifically reproduce it and cannot describe it further.  Of note, his CT did show a ~0.5-0.6 cm non-obstructing right renal stone. He denies urinary symptoms, hematuria, or frank flank / back pain.  Review of Systems: As above. Denies chest pain, SOB, cough, leg swelling, abdominal pain (other than as above), N/V, change in bowel habits.     Objective:   Physical Exam BP 139/90 mmHg  Pulse 80  Ht 5\' 10"  (1.778 m)  Wt 264 lb (119.75 kg)  BMI 37.88 kg/m2 Gen: well-appearing adult male, uncomfortable with some movements but in NAD HEENT: [place]/AT, EOMI, PERRLA Cardio: RRR, no murmur Pulm: CTAB, no wheezes Abd: soft, nontender, BS+ MSK: markedly tender over inferior right costal margin with exquisitely reproducible pain with palpation  Overlying skin is intact, non-bruised  Some spasm of lower abdominal  musculature noted with palpation for exam over tender area  NO CVA tenderness, minimal lumbar muscle spasm noted at baseline Neuro: alert, oriented, sensation grossly intact, strength 5/5 throughout lower extremities     Assessment & Plan:  39yo male with likely bruised right ribs and spasm of right sided abdominal wall musculature - no red flags on exam, no systemic symptoms - doubt contribution of renal stone to current pain given character of pain and findings on exam - Rx for Mobic 7.5 mg daily or every other day (depending on tolerance; known hx of GERD and cardiac issues) for 1-2 weeks - Rx for Percocet 5-325 mg TID PRN and Robaxin 500 mg TID PRN (previously helpful for similar MSK-type pain of the back) - f/u as needed, otherwise; counseled on signs / symptoms that would suggest frank issues with renal stone  Bobbye Mortonhristopher M Jacinto Keil, MD PGY-3, [facility] 08/06/2014, 6:23 PM

## 2014-08-06 NOTE — Patient Instructions (Signed)
Thank you for coming in, today!  I think you have some bruising and probably some pulled muscles around your ribs. I want you take a medicine called Mobic (meloxicam) 7.5 mg. Take this every day or every other day for 2 weeks. Take it with food. This will help cut down the inflammation, and should help with the pain. For the pain itself, you can take Percocet and / or Robaxin up to three times per day as needed.  Call me or come back if you're not getting any better in the next week or two. Otherwise, come back to see me as you need.  Please feel free to call with any questions or concerns at any time, at [phone]. --Dr. Casper HarrisonStreet

## 2014-10-01 ENCOUNTER — Other Ambulatory Visit: Payer: Self-pay | Admitting: Family Medicine

## 2014-10-29 ENCOUNTER — Other Ambulatory Visit: Payer: Self-pay | Admitting: Family Medicine

## 2014-11-12 ENCOUNTER — Emergency Department (HOSPITAL_COMMUNITY): Payer: Medicare Other

## 2014-11-12 ENCOUNTER — Emergency Department (HOSPITAL_COMMUNITY)
Admission: EM | Admit: 2014-11-12 | Discharge: 2014-11-12 | Disposition: A | Discharge status: Home / Self Care | Payer: Medicare Other | Attending: Emergency Medicine | Admitting: Emergency Medicine

## 2014-11-12 DIAGNOSIS — Y9389 Activity, other specified: Secondary | ICD-10-CM | POA: Diagnosis not present

## 2014-11-12 DIAGNOSIS — M79642 Pain in left hand: Secondary | ICD-10-CM | POA: Diagnosis not present

## 2014-11-12 DIAGNOSIS — E785 Hyperlipidemia, unspecified: Secondary | ICD-10-CM | POA: Insufficient documentation

## 2014-11-12 DIAGNOSIS — I252 Old myocardial infarction: Secondary | ICD-10-CM | POA: Diagnosis not present

## 2014-11-12 DIAGNOSIS — Z8709 Personal history of other diseases of the respiratory system: Secondary | ICD-10-CM | POA: Insufficient documentation

## 2014-11-12 DIAGNOSIS — Z720 Tobacco use: Secondary | ICD-10-CM | POA: Diagnosis not present

## 2014-11-12 DIAGNOSIS — W228XXA Striking against or struck by other objects, initial encounter: Secondary | ICD-10-CM | POA: Diagnosis not present

## 2014-11-12 DIAGNOSIS — Z79899 Other long term (current) drug therapy: Secondary | ICD-10-CM | POA: Diagnosis not present

## 2014-11-12 DIAGNOSIS — T148XXA Other injury of unspecified body region, initial encounter: Secondary | ICD-10-CM

## 2014-11-12 DIAGNOSIS — S61432A Puncture wound without foreign body of left hand, initial encounter: Secondary | ICD-10-CM | POA: Diagnosis not present

## 2014-11-12 DIAGNOSIS — S61412A Laceration without foreign body of left hand, initial encounter: Secondary | ICD-10-CM | POA: Insufficient documentation

## 2014-11-12 DIAGNOSIS — Z23 Encounter for immunization: Secondary | ICD-10-CM | POA: Insufficient documentation

## 2014-11-12 DIAGNOSIS — Z951 Presence of aortocoronary bypass graft: Secondary | ICD-10-CM | POA: Diagnosis not present

## 2014-11-12 DIAGNOSIS — Z7902 Long term (current) use of antithrombotics/antiplatelets: Secondary | ICD-10-CM | POA: Insufficient documentation

## 2014-11-12 DIAGNOSIS — Y929 Unspecified place or not applicable: Secondary | ICD-10-CM | POA: Diagnosis not present

## 2014-11-12 DIAGNOSIS — IMO0002 Reserved for concepts with insufficient information to code with codable children: Secondary | ICD-10-CM

## 2014-11-12 DIAGNOSIS — I10 Essential (primary) hypertension: Secondary | ICD-10-CM | POA: Diagnosis not present

## 2014-11-12 DIAGNOSIS — Y998 Other external cause status: Secondary | ICD-10-CM | POA: Insufficient documentation

## 2014-11-12 DIAGNOSIS — S6992XA Unspecified injury of left wrist, hand and finger(s), initial encounter: Secondary | ICD-10-CM | POA: Diagnosis not present

## 2014-11-12 MED ORDER — AMOXICILLIN-POT CLAVULANATE 875-125 MG PO TABS: 1.0000 | ORAL_TABLET | Freq: Two times a day (BID) | ORAL | Status: DC

## 2014-11-12 MED ORDER — LIDOCAINE HCL (PF) 1 % IJ SOLN
2.0000 mL | Freq: Once | INTRAMUSCULAR | Status: AC
  Administered 2014-11-12: 2 mL
  Filled 2014-11-12: qty 5

## 2014-11-12 MED ORDER — TETANUS-DIPHTH-ACELL PERTUSSIS 5-2.5-18.5 LF-MCG/0.5 IM SUSP
0.5000 mL | Freq: Once | INTRAMUSCULAR | Status: AC
  Administered 2014-11-12: 0.5 mL via INTRAMUSCULAR
  Filled 2014-11-12: qty 0.5

## 2014-11-12 NOTE — Discharge Instructions (Signed)
Puncture Wound °A puncture wound is an injury that extends through all layers of the skin and into the tissue beneath the skin (subcutaneous tissue). Puncture wounds become infected easily because germs often enter the body and go beneath the skin during the injury. Having a deep wound with a small entrance point makes it difficult for your caregiver to adequately clean the wound. This is especially true if you have stepped on a nail and it has passed through a dirty shoe or other situations where the wound is obviously contaminated. °CAUSES  °Many puncture wounds involve glass, nails, splinters, fish hooks, or other objects that enter the skin (foreign bodies). A puncture wound may also be caused by a human bite or animal bite. °DIAGNOSIS  °A puncture wound is usually diagnosed by your history and a physical exam. You may need to have an X-ray or an ultrasound to check for any foreign bodies still in the wound. °TREATMENT  °· Your caregiver will clean the wound as thoroughly as possible. Depending on the location of the wound, a bandage (dressing) may be applied. °· Your caregiver might prescribe antibiotic medicines. °· You may need a follow-up visit to check on your wound. Follow all instructions as directed by your caregiver. °HOME CARE INSTRUCTIONS  °· Change your dressing once per day, or as directed by your caregiver. If the dressing sticks, it may be removed by soaking the area in water. °· If your caregiver has given you follow-up instructions, it is very important that you return for a follow-up appointment. Not following up as directed could result in a chronic or permanent injury, pain, and disability. °· Only take over-the-counter or prescription medicines for pain, discomfort, or fever as directed by your caregiver. °· If you are given antibiotics, take them as directed. Finish them even if you start to feel better. °You may need a tetanus shot if: °· You cannot remember when you had your last tetanus  shot. °· You have never had a tetanus shot. °If you got a tetanus shot, your arm may swell, get red, and feel warm to the touch. This is common and not a problem. If you need a tetanus shot and you choose not to have one, there is a rare chance of getting tetanus. Sickness from tetanus can be serious. °You may need a rabies shot if an animal bite caused your puncture wound. °SEEK MEDICAL CARE IF:  °· You have redness, swelling, or increasing pain in the wound. °· You have red streaks going away from the wound. °· You notice a bad smell coming from the wound or dressing. °· You have yellowish-white fluid (pus) coming from the wound. °· You are treated with an antibiotic for infection, but the infection is not getting better. °· You notice something in the wound, such as rubber from your shoe, cloth, or another object. °· You have a fever. °· You have severe pain. °· You have difficulty breathing. °· You feel dizzy or faint. °· You cannot stop vomiting. °· You lose feeling, develop numbness, or cannot move a limb below the wound. °· Your symptoms worsen. °MAKE SURE YOU: °· Understand these instructions. °· Will watch your condition. °· Will get help right away if you are not doing well or get worse. °Document Released: 04/15/2005 Document Revised: 09/28/2011 Document Reviewed: 12/23/2010 °[employer]® Patient Information ©2015 [employer]. This information is not intended to replace advice given to you by your health care provider. Make sure you discuss any questions you   have with your health care provider. ° °

## 2014-11-12 NOTE — ED Provider Notes (Signed)
CSN: [id number]     Arrival date & time 11/12/14  2107 History  This chart was scribed for non-physician practitioner, Felicie Morn, NP working with Doug Sou, MD by Greggory Stallion, ED scribe. This patient was seen in room TR06C/TR06C and the patient's care was started at 9:26 PM.   Chief Complaint  Patient presents with  . Extremity Laceration   The history is provided by the patient. No language interpreter was used.    HPI Comments: [name] is a 39 y.o. male who presents to the Emergency Department complaining of a puncture wound to his left hand that occurred earlier tonight. Pt was pulling on a piece of wood when his hand slipped and was punctured by a nail on the palmar side. The caused a small laceration when he moved it. Bleeding is controlled with pressure. Pt denies numbness or tingling. He is unsure of when his last tetanus was. Pt is currently on blood thinners. He is right hand dominant.   Past Medical History  Diagnosis Date  . Hypertension   . MI (myocardial infarction)   . Hx of CABG   . Heart attack   . Bronchitis   . Tobacco user   . Hyperlipidemia   . MYOCARDIAL INFARCTION 10/13/2008    Qualifier: Diagnosis of  By: Juanda Chance, MD, Johny Chess    Past Surgical History  Procedure Laterality Date  . Coronary artery bypass graft      status post diaphragmatic wall infarction Rx BMS RCA 03-11-08   . Orthopedic surgery    . Coronary stent placement    . Laparoscopic appendectomy N/A 09/17/2013    Procedure: APPENDECTOMY LAPAROSCOPIC;  Surgeon: Shelly Rubenstein, MD;  Location: [facility] OR;  Service: General;  Laterality: N/A;  . Appendectomy    . Left heart catheterization with coronary/graft angiogram N/A 10/19/2011    Procedure: LEFT HEART CATHETERIZATION WITH Isabel Caprice;  Surgeon: Herby Abraham, MD;  Location: [facility] CATH LAB;  Service: Cardiovascular;  Laterality: N/A;   Family History  Problem Relation Age of Onset  . Cancer Mother   . Cancer  Sister   . Diabetes Mother   . Diabetes Sister   . Hyperlipidemia Mother   . Hypertension Mother   . Hyperlipidemia Sister   . Hypertension Sister   . Early death Mother   . Early death Sister    History  Substance Use Topics  . Smoking status: Light Tobacco Smoker -- 0.20 packs/day for 20 years    Types: Cigarettes  . Smokeless tobacco: Never Used     Comment: E-Cigarette  . Alcohol Use: No     Comment: maybe 3-4 drinks a week, occasionally.    Review of Systems  Skin: Positive for wound.  Neurological: Negative for numbness.  All other systems reviewed and are negative.  Allergies  Codeine and Celexa  Home Medications   Prior to Admission medications   Medication Sig Start Date End Date Taking? Authorizing Provider  atorvastatin (LIPITOR) 80 MG tablet TAKE ONE TABLET BY MOUTH ONCE DAILY AT  6  PM 10/01/14   Stephanie Coup Street, MD  clonazePAM (KLONOPIN) 1 MG tablet Take 0.5-1 tablets (0.5-1 mg total) by mouth 2 (two) times daily as needed for anxiety. 06/07/14   Stephanie Coup Street, MD  clopidogrel (PLAVIX) 75 MG tablet TAKE ONE TABLET BY MOUTH IN THE MORNING 10/01/14   Stephanie Coup Street, MD  fenofibrate (TRICOR) 48 MG tablet TAKE ONE TABLET BY MOUTH ONCE DAILY 10/01/14  Stephanie Couphristopher M Street, MD  isosorbide mononitrate (IMDUR) 30 MG 24 hr tablet TAKE ONE TABLET BY MOUTH ONCE DAILY 10/01/14   Stephanie Couphristopher M Street, MD  losartan (COZAAR) 100 MG tablet TAKE ONE-HALF TABLET BY MOUTH TWICE DAILY 10/29/14   Stephanie Couphristopher M Street, MD  meloxicam (MOBIC) 7.5 MG tablet Take 1 tablet (7.5 mg total) by mouth daily. 08/06/14   Stephanie Couphristopher M Street, MD  methocarbamol (ROBAXIN) 500 MG tablet Take 1 tablet (500 mg total) by mouth 3 (three) times daily. 08/06/14   Stephanie Couphristopher M Street, MD  metoprolol tartrate (LOPRESSOR) 25 MG tablet TAKE ONE TABLET BY MOUTH TWICE DAILY 10/01/14   Stephanie Couphristopher M Street, MD  Multiple Vitamin (MULITIVITAMIN WITH MINERALS) TABS Take 1 tablet by mouth every morning.      Historical Provider, MD  nitroGLYCERIN (NITROSTAT) 0.4 MG SL tablet Place 1 tablet (0.4 mg total) under the tongue every 5 (five) minutes as needed for chest pain. 08/08/13   Laurey Moralealton S McLean, MD  oxyCODONE-acetaminophen (PERCOCET) 5-325 MG per tablet Take 1 tablet by mouth every 8 (eight) hours as needed for severe pain. 08/06/14   Stephanie Couphristopher M Street, MD  Probiotic Product (PRO-BIOTIC BLEND) CAPS Take 1 capsule by mouth daily. Helps with digestion    Historical Provider, MD  RANEXA 500 MG 12 hr tablet TAKE ONE TABLET BY MOUTH TWICE DAILY 04/23/14   Stephanie Couphristopher M Street, MD   BP 122/79 mmHg  Pulse 98  Temp(Src) 98.1 F (36.7 C) (Oral)  Resp 16  Ht 5\' 11"  (1.803 m)  Wt 260 lb 1.6 oz (117.981 kg)  BMI 36.29 kg/m2  SpO2 96%   Physical Exam  Constitutional: He is oriented to person, place, and time. He appears well-developed and well-nourished. No distress.  HENT:  Head: Normocephalic and atraumatic.  Eyes: Conjunctivae and EOM are normal.  Neck: Neck supple. No tracheal deviation present.  Cardiovascular: Normal rate.   Pulmonary/Chest: Effort normal. No respiratory distress.  Musculoskeletal: Normal range of motion.  Neurological: He is alert and oriented to person, place, and time.  Skin: Skin is warm and dry.  1 cm laceration/puncture wound to palmar aspect of left hand.  Psychiatric: He has a normal mood and affect. His behavior is normal.  Nursing note and vitals reviewed.   ED Course  Procedures (including critical care time)  Local infiltration of 1% lidocaine. Wound cleaned, irrigated copiously with normal saline. No FB seen or identified.  Sterile pressure dressing applied.  DIAGNOSTIC STUDIES: Oxygen Saturation is 96% on RA, normal by my interpretation.    COORDINATION OF CARE: 9:36 PM-Discussed treatment plan which includes updating tetanus and xray with pt at bedside and pt agreed to plan.   Labs Review Labs Reviewed - No data to display  Imaging Review Dg  Hand Complete Left  11/12/2014   CLINICAL DATA:  Hammered nail into palm of left hand, with pain. Initial encounter.  EXAM: LEFT HAND - COMPLETE 3+ VIEW  COMPARISON:  Left middle finger radiographs performed 04/09/2010  FINDINGS: There is no evidence of fracture or dislocation. The joint spaces are preserved. The carpal rows are intact, and demonstrate normal alignment. A small osseous fragment distal to the ulnar styloid may reflect remote injury.  The clinically described soft tissue wound is not well characterized on radiograph. No radiopaque foreign bodies are seen.  IMPRESSION: No evidence of fracture or dislocation. No radiopaque foreign bodies seen.   Electronically Signed   By: Roanna RaiderJeffery  Chang M.D.   On: 11/12/2014 22:05  EKG Interpretation None      MDM   Final diagnoses:  None  Radiology results reviewed, shared with patient.  No fracture/dislocation.  No foreign body identified  Laceration/puncture wound to palmer aspect of left hand.  Neurovascular intact.  Able to move fingers against resistance in flexion and extension without weakness.  Wound cleaned, dressed.  Tetanus updated.  Started on augmentin.  Follow-up with PCP and hand.  I personally performed the services described in this documentation, which was scribed in my presence. The recorded information has been reviewed and is accurate.    Felicie Morn, NP 11/12/14 1610  Doug Sou, MD 11/13/14 [phone]

## 2014-11-12 NOTE — ED Notes (Signed)
Patient here with complaint of left hand puncture wound. States that he punctured it with a nail. Explains that he was pulling on a piece of wood when it slipped and his palm was impaled by an opposing nail. Bleeding currently active, but pressure stops it. Bulky pressure dressing applied in triage. Sensation and movement intact. VS WDL. Pain minimal.

## 2014-11-14 ENCOUNTER — Ambulatory Visit: Payer: Medicare Other | Admitting: Family Medicine

## 2014-12-07 ENCOUNTER — Ambulatory Visit (INDEPENDENT_AMBULATORY_CARE_PROVIDER_SITE_OTHER): Payer: Medicare Other | Admitting: Cardiovascular Disease

## 2014-12-07 ENCOUNTER — Encounter: Payer: Self-pay | Admitting: Cardiovascular Disease

## 2014-12-07 VITALS — BP 130/84 | HR 76 | Ht 72.0 in | Wt 268.8 lb

## 2014-12-07 DIAGNOSIS — Z720 Tobacco use: Secondary | ICD-10-CM | POA: Diagnosis not present

## 2014-12-07 DIAGNOSIS — E785 Hyperlipidemia, unspecified: Secondary | ICD-10-CM | POA: Diagnosis not present

## 2014-12-07 DIAGNOSIS — I10 Essential (primary) hypertension: Secondary | ICD-10-CM | POA: Diagnosis not present

## 2014-12-07 DIAGNOSIS — I2510 Atherosclerotic heart disease of native coronary artery without angina pectoris: Secondary | ICD-10-CM

## 2014-12-07 NOTE — Progress Notes (Signed)
No chief complaint on file. anxiety  History of Present Illness: 39 year old male with history of CAD with serial PCI and a 4-vessel CABG in 2009, HTN, HLD, tobacco abuse who is here today for cardiac follow up. He was [facility] to [facility] with chest pain on 10/19/11 and [facility] for cath per Dr. Riley KillStuckey. Cardiac cath on 10/19/11 with stable CAD, bypasses. He was discharged home on Imdur. His coronary history began in 2009 with an inferior MI requiring RCA PCI and subsequent CABG. He then presented in 2010 with an NSTEMI in 2010 requiring LCx PCI. He underwent repeat coronary angiography in September of 2011 and was found to have stable native and bypass graft anatomy not requiring any intervention at the time. As above, last cath in April 2013 with stable disease. Started on Ranexa in April 2013. ( in assistance program). [facility], [facility] with chest pain. Ruled out for MI with serial cardiac enzymes. He was last seen January 2015 in our office and was doing well. Admitted with acute appendicitis March 2015.   He is here today for cardiac follow up. He has been doing well. He has had no chest pain. He is walking over one mile per day. 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 Breathing is at baseline. He is only smoking 10 cigarettes per day.   Primary Care Physician: [facility]  Last Lipid Profile:Lipid Panel     Component Value Date/Time   CHOL 206* 09/17/2013 1200   TRIG 436* 09/17/2013 1200   HDL 35* 09/17/2013 1200   CHOLHDL 5.9 09/17/2013 1200   VLDL UNABLE TO CALCULATE IF TRIGLYCERIDE OVER 400 mg/dL 40/98/119103/07/2013 47821200   LDLCALC UNABLE TO CALCULATE IF TRIGLYCERIDE OVER 400 mg/dL 95/62/130803/07/2013 65781200    Past Medical History  Diagnosis Date  . Hypertension   . MI (myocardial infarction)   . Hx of CABG   . Heart attack   . Bronchitis   . Tobacco user   . Hyperlipidemia   . MYOCARDIAL  INFARCTION 10/13/2008    Qualifier: Diagnosis of  By: Juanda ChanceBrodie, MD, Johny ChessFACC, Bruce Rogers     Past Surgical History  Procedure Laterality Date  . Coronary artery bypass graft      status post diaphragmatic wall infarction Rx BMS RCA 03-11-08   . Orthopedic surgery    . Coronary stent placement    . Laparoscopic appendectomy N/A 09/17/2013    Procedure: APPENDECTOMY LAPAROSCOPIC;  Surgeon: Shelly Rubensteinouglas A Blackman, MD;  Location: [facility] OR;  Service: General;  Laterality: N/A;  . Appendectomy    . Left heart catheterization with coronary/graft angiogram N/A 10/19/2011    Procedure: LEFT HEART CATHETERIZATION WITH Isabel CapriceORONARY/GRAFT ANGIOGRAM;  Surgeon: Herby Abrahamhomas D Stuckey, MD;  Location[facility] C[facility] LAB;  Service: Cardiovascular;  Laterality: N/A;    Current Outpatient Prescriptions  Medication Sig Dispense Refill  . atorvastatin (LIPITOR) 80 MG tablet TAKE ONE TABLET BY MOUTH ONCE DAILY AT  6  PM 30 tablet 5  . clonazePAM (KLONOPIN) 1 MG tablet Take 0.5-1 tablets (0.5-1 mg total) by mouth 2 (two) times daily as needed for anxiety. 60 tablet 1  . clopidogrel (PLAVIX) 75 MG tablet TAKE ONE TABLET BY MOUTH IN THE MORNING 30 tablet 5  . fenofibrate (TRICOR) 48 MG tablet TAKE ONE TABLET BY MOUTH ONCE DAILY 30 tablet 5  . isosorbide mononitrate (IMDUR) 30 MG 24 hr tablet TAKE ONE TABLET BY MOUTH ONCE  DAILY 30 tablet 5  . losartan (COZAAR) 100 MG tablet TAKE ONE-HALF TABLET BY MOUTH TWICE DAILY 45 tablet 3  . meloxicam (MOBIC) 7.5 MG tablet Take 7.5 mg by mouth daily as needed for pain.    . methocarbamol (ROBAXIN) 500 MG tablet Take 500 mg by mouth 3 (three) times daily as needed for muscle spasms.    . metoprolol tartrate (LOPRESSOR) 25 MG tablet TAKE ONE TABLET BY MOUTH TWICE DAILY 60 tablet 5  . Multiple Vitamin (MULITIVITAMIN WITH MINERALS) TABS Take 1 tablet by mouth every morning.     . nitroGLYCERIN (NITROSTAT) 0.4 MG SL tablet Place 1 tablet (0.4 mg total) under the tongue every 5 (five) minutes as needed for chest  pain. 25 tablet 2  . oxyCODONE-acetaminophen (PERCOCET) 5-325 MG per tablet Take 1 tablet by mouth every 8 (eight) hours as needed for severe pain. 45 tablet 0  . Probiotic Product (PRO-BIOTIC BLEND) CAPS Take 1 capsule by mouth daily. Helps with digestion    . RANEXA 500 MG 12 hr tablet TAKE ONE TABLET BY MOUTH TWICE DAILY 180 tablet 2   No current facility-administered medications for this visit.    Allergies  Allergen Reactions  . Codeine Itching  . Celexa [Citalopram] Itching and Rash    History   Social History  . Marital Status: Divorced    Spouse Name: N/A  . Number of Children: N/A  . Years of Education: N/A   Occupational History  . Not on file.   Social History Main Topics  . Smoking status: Light Tobacco Smoker -- 0.20 packs/day for 20 years    Types: Cigarettes  . Smokeless tobacco: Never Used     Comment: E-Cigarette  . Alcohol Use: No     Comment: maybe 3-4 drinks a week, occasionally.  . Drug Use: No     Comment: former   . Sexual Activity: Not on file   Other Topics Concern  . Not on file   Social History Narrative    Family History  Problem Relation Age of Onset  . Cancer Mother   . Cancer Sister   . Diabetes Mother   . Diabetes Sister   . Hyperlipidemia Mother   . Hypertension Mother   . Hyperlipidemia Sister   . Hypertension Sister   . Early death Mother   . Early death Sister     Review of Systems:  As stated in the HPI and otherwise negative.   BP 130/84 mmHg  Pulse 76  Ht 6' (1.829 m)  Wt 268 lb 12.8 oz (121.927 kg)  BMI 36.45 kg/m2  Physical Examination: General: Well developed, well nourished, NAD HEENT: OP clear, mucus membranes moist SKIN: warm, dry. No rashes. Neuro: No focal deficits Musculoskeletal: Muscle strength 5/5 all ext Psychiatric: Mood and affect normal Neck: No JVD, no carotid bruits, no thyromegaly, no lymphadenopathy. Lungs:Clear bilaterally, no wheezes, rhonci, crackles Cardiovascular: Regular rate and  rhythm. No murmurs, gallops or rubs. Abdomen:Soft. Bowel sounds present. Non-tender.  Extremities: No lower extremity edema. Pulses are 2 + in the bilateral DP/PT.  EKG:  EKG is ordered today. The ekg ordered today demonstrates NSR, 76 bpm. Old inferior Q waves.   Recent Labs: 07/23/2014: ALT 18; BUN 10; Creatinine 0.94; Hemoglobin 16.4; Platelets 236; Potassium 3.9; Sodium 140   Lipid Panel    Component Value Date/Time   CHOL 206* 09/17/2013 1200   TRIG 436* 09/17/2013 1200   HDL 35* 09/17/2013 1200   CHOLHDL 5.9 09/17/2013 1200  VLDL UNABLE TO CALCULATE IF TRIGLYCERIDE OVER 400 mg/dL 16/10/960403/07/2013 54091200   LDLCALC UNABLE TO CALCULATE IF TRIGLYCERIDE OVER 400 mg/dL 81/19/147803/07/2013 29561200   LDLDIRECT 103.2 06/15/2012 1446     Wt Readings from Last 3 Encounters:  12/07/14 268 lb 12.8 oz (121.927 kg)  11/12/14 260 lb 1.6 oz (117.981 kg)  08/06/14 264 lb (119.75 kg)     Other studies Reviewed: Additional studies/ records that were reviewed today include: . Review of the above records demonstrates:    Assessment and Plan:   1. CAD: Stable. Cath April 2013 with stable severe disease s/p CABG. Will continue Plavix, statin, beta blocker, ARB, Ranexa and Imdur. He does not tolerate ASA due to GI upset.    2. HYPERLIPIDEMIA/Hypertriglyceridemia. Continue statin and Tricor.   3. TOBACCO USE: He has started back smoking. Smoking cessation counseling today.   4. HTN: BP well controlled. No changes.   5. Anxiety: He is asked to follow up in primary care  Current medicines are reviewed at length with the patient today.  The patient does not have concerns regarding medicines.  The following changes have been made:  no change  Labs/ tests ordered today include:   Orders Placed This Encounter  Procedures  . EKG 12-Lead    Disposition:   FU with me in 6 months  Signed, Verne Carrowhristopher McAlhany, MD 12/07/2014 1:21 PM    [facility] [address], [place], [place]   [id number] Phone: [phone]; Fax: [phone]

## 2014-12-07 NOTE — Patient Instructions (Signed)
Medication Instructions:  Your physician recommends that you continue on your current medications as directed. Please refer to the Current Medication list given to you today.   Labwork: none  Testing/Procedures: none  Follow-Up: Your physician wants you to follow-up in: 6 months.  You will receive a reminder letter in the mail two months in advance. If you don't receive a letter, please call our office to schedule the follow-up appointment.       

## 2014-12-28 ENCOUNTER — Other Ambulatory Visit: Payer: Self-pay | Admitting: Family Medicine

## 2014-12-28 MED ORDER — LOSARTAN POTASSIUM 100 MG PO TABS: 100.0000 mg | ORAL_TABLET | Freq: Every day | ORAL | Status: DC

## 2014-12-28 NOTE — Telephone Encounter (Signed)
Sent in Rx for losartan 100 mg tablet daily; received call from pt through B. Charlean Sanfilippo (has been taking a whole tablet daily instead of a half-tablet BID, so has run out early -- previous Rx for 45 tablets which would have thus been a 90-day supply). Unsure why Rx was for half-tablet BID (unclear from documentation). Will f/u as needed. --CMS

## 2015-01-04 ENCOUNTER — Ambulatory Visit: Payer: Medicare Other | Admitting: Family Medicine

## 2015-01-10 ENCOUNTER — Encounter: Payer: Self-pay | Admitting: Family Medicine

## 2015-01-10 ENCOUNTER — Ambulatory Visit (INDEPENDENT_AMBULATORY_CARE_PROVIDER_SITE_OTHER): Payer: Medicare Other | Admitting: Family Medicine

## 2015-01-10 VITALS — BP 127/79 | HR 91 | Temp 97.8°F | Wt 268.1 lb

## 2015-01-10 DIAGNOSIS — M5441 Lumbago with sciatica, right side: Secondary | ICD-10-CM | POA: Diagnosis not present

## 2015-01-10 DIAGNOSIS — Z720 Tobacco use: Secondary | ICD-10-CM | POA: Diagnosis not present

## 2015-01-10 DIAGNOSIS — I25119 Atherosclerotic heart disease of native coronary artery with unspecified angina pectoris: Secondary | ICD-10-CM | POA: Diagnosis not present

## 2015-01-10 DIAGNOSIS — I10 Essential (primary) hypertension: Secondary | ICD-10-CM

## 2015-01-10 DIAGNOSIS — M519 Unspecified thoracic, thoracolumbar and lumbosacral intervertebral disc disorder: Secondary | ICD-10-CM | POA: Diagnosis not present

## 2015-01-10 DIAGNOSIS — F17200 Nicotine dependence, unspecified, uncomplicated: Secondary | ICD-10-CM

## 2015-01-10 DIAGNOSIS — F419 Anxiety disorder, unspecified: Secondary | ICD-10-CM | POA: Diagnosis not present

## 2015-01-10 MED ORDER — CLONAZEPAM 1 MG PO TABS: 0.5000 mg | ORAL_TABLET | Freq: Two times a day (BID) | ORAL | Status: DC | PRN

## 2015-01-10 MED ORDER — OXYCODONE-ACETAMINOPHEN 5-325 MG PO TABS: 1.0000 | ORAL_TABLET | Freq: Three times a day (TID) | ORAL | Status: DC | PRN

## 2015-01-10 MED ORDER — BACLOFEN 10 MG PO TABS: 10.0000 mg | ORAL_TABLET | Freq: Three times a day (TID) | ORAL | Status: DC

## 2015-01-10 NOTE — Assessment & Plan Note (Signed)
Well-controlled; recent mix-up with Cozaar Rx of unclear etiology (appears to be an error on my part in prescribing at some point in the past). Continue current medications and f/u as needed. Monitor routinely at f/u. Otherwise f/u with Dr. Clifton James as well, as needed / directed.

## 2015-01-10 NOTE — Assessment & Plan Note (Signed)
Still smoking small numbers of cigarettes daily and using snuff daily. Has used e-vapor device in the past. Has had varying success with different interventions in the past and declines formal assistance, today. F/u as needed; see anxiety problem list note. Continue to recommend complete cessation and counseling at every f/u.

## 2015-01-10 NOTE — Assessment & Plan Note (Signed)
A: Relatively mild anxiety symptoms, generally worse with stress and pain, managed with occasional Klonopin use (last 44-month Rx lasted 4+ months, last filled ~7 months ago). Denies frank depression. Smoking / tobacco use is worse when anxiety is worse.  P: Continue Klonopin, 0.5 - 1 mg BID PRN, #60 with 1 refill (unchanged from previous). Will need closer / more regular follow-up if use increases more than current occasional use.

## 2015-01-10 NOTE — Assessment & Plan Note (Signed)
Extensive hx, s/p MI, CABG, stenting. No current active cardiac-type chest pain, following regularly with Dr. Clifton James. Compliant with Lipitor, Ranexa, Imdur, Lopressor, Cozaar. F/u as needed / directed by cardiology.

## 2015-01-10 NOTE — Progress Notes (Signed)
   Subjective:    Patient ID: [name], male    DOB: 10-Apr-1976, 39 y.o.   MRN: [id number]  HPI: Pt presents to clinic for general follow-up, especially to discuss back pain.  Chronic back pain - worse for the past few weeks, since the beginning of June but no increased activity or injuries - previously had been relatively quiescent; was able to help paint a house a couple of months ago without any issues - pain is described as a tight, aching, sometimes sharp "pulling" pain, from the base of his spine to his neck - continues to have have some chronic lumbar dull ache and also endorses some intermittent paresthesias but no weakness - pt's weight is down between 10-20 lbs in the past year and a half, which he does think has helped his overall back pain - reports little help with aspirin, Aleve, and another OTC "liqui-cap" medication but he is not sure what it was - has not used muscle relaxers or narcotics in quite some time  Anxiety - worse with back pain; has also had some worry about an upcoming disability review - does report good relief with Klonopin (last prescription was about 7 months ago and it lasted ~4 months) - reports he has not taken Klonopin in about 3 months but would like to get it refilled - does dip snuff, but is no longer smoking  Cardiac - extensive heart history with hx of MI, s/p stenting and CABG - denies active chest pain, SOB, LE swelling; has some MSK-type chest pain with back pain and hot weather "takes his breath" - reports compliance with metoprolol, Plavix, atorvastatin, Tricor, Imdur, and Ranexa - recently saw Dr. Clifton James with no specific changes made  Tobacco abuse - dipping snuff as above, but no longer smoking (last smoking was a month or more ago)  In addition to the above documentation, pt's PMH, surgical history, FH, and SH all reviewed and updated where appropriate in the EMR. I have also reviewed and updated the pt's allergies and current  medications as appropriate.  Review of Systems: As above. Otherwise, full 12-system ROS was reviewed and all negative.     Objective:   Physical Exam BP 127/79 mmHg  Pulse 91  Temp(Src) 97.8 F (36.6 C)  Wt 268 lb 1.6 oz (121.609 kg) Gen: well-appearing adult male in NAD HEENT: [place]/AT, EOMI, PERRLA, TM's clear, MMM Cardio: RRR, no murmur appreciated Pulm: CTAB, no wheezes, normal WOB MSK: marked, diffuse tenderness in paraspinal musculature  Worst tenderness in low lumbar area but present throughout thoracic and cervical areas  ROM to all extremities normal  Straight-leg lift test positive on the right for radicular-type pain down right leg to fot Neuro: alert / oriented, strength 4+/5 in LE due to pain; 5/5 throughout UE Skin: warm, well-perfused; numerous tattoos     Assessment & Plan:  See problem list notes.

## 2015-01-10 NOTE — Patient Instructions (Signed)
Thank you for coming in, today!  I'm not sure what set everything off but I think a lot of your pain issues are from muscle spasm. I want you to take a medication called baclofen for muscle spasm. Start with 1 tablet at bedtime, since it might make you sleepy. Take it up to 3 times per day as needed, after that. You can take Percocet for pain, as well. Don't drive within about 6 hours of taking the Percocet. I will refill your Klonopin, as well. Take it as little as possible. Try to stop using dip as best you can, but definitely stay off cigarettes.  After me your doctor will be Dr. Leonides Schanz. Come back to see her as you need, and follow up with Dr. Clifton James as he instructs.  Please feel free to call with any questions or concerns at any time, at [phone]. --Dr. Casper Harrison

## 2015-01-10 NOTE — Assessment & Plan Note (Signed)
A: Chronic underlying cause of back pain with significant component of muscle spasm. Neurovascularly intact but with some sciatica-type pain on the right. Has used Percocet intermittently in the past with relief. Has used Robaxin in the past, as well, but not on formulary for insurance, currently.  P: Rx for Percocet and baclofen. Counseled on ROM and stretching exercises for LE and back muscles and provided handout with demonstrations. Could consider formal PT referral in the future; not a great orthopedic surgery candidate due to smoking / tobacco use and extensive heart history, but could consider ortho referral in the future, as well. F/u as needed, otherwise.

## 2015-01-29 ENCOUNTER — Other Ambulatory Visit: Payer: Self-pay | Admitting: Family Medicine

## 2015-02-01 ENCOUNTER — Telehealth: Payer: Self-pay | Admitting: Cardiovascular Disease

## 2015-02-01 NOTE — Telephone Encounter (Signed)
Received Disability Determination form sent to [employer] for completion.  02/01/15  ltd °

## 2015-02-04 NOTE — Telephone Encounter (Signed)
Rx for Ranexa refilled x 2 month supply.  Thanks, [employer]

## 2015-02-18 ENCOUNTER — Ambulatory Visit (INDEPENDENT_AMBULATORY_CARE_PROVIDER_SITE_OTHER): Payer: Medicare Other | Admitting: Family Medicine

## 2015-02-18 ENCOUNTER — Encounter: Payer: Self-pay | Admitting: Family Medicine

## 2015-02-18 VITALS — BP 141/95 | HR 77 | Temp 98.1°F | Ht 72.0 in | Wt 275.0 lb

## 2015-02-18 DIAGNOSIS — I25119 Atherosclerotic heart disease of native coronary artery with unspecified angina pectoris: Secondary | ICD-10-CM | POA: Diagnosis not present

## 2015-02-18 DIAGNOSIS — M549 Dorsalgia, unspecified: Secondary | ICD-10-CM | POA: Diagnosis not present

## 2015-02-18 DIAGNOSIS — M519 Unspecified thoracic, thoracolumbar and lumbosacral intervertebral disc disorder: Secondary | ICD-10-CM

## 2015-02-18 MED ORDER — BACLOFEN 10 MG PO TABS: 10.0000 mg | ORAL_TABLET | Freq: Three times a day (TID) | ORAL | Status: DC

## 2015-02-18 MED ORDER — CLONAZEPAM 1 MG PO TABS: 0.5000 mg | ORAL_TABLET | Freq: Two times a day (BID) | ORAL | Status: DC | PRN

## 2015-02-18 NOTE — Patient Instructions (Signed)
It was nice to meet you! Most of your pain seems to be from muscle spasms. I have refilled the baclofen. I will refill your Klonopin, as well. Take it as little as possible. Try to stop using dip as best you can, but definitely stay off cigarettes. Please feel free to call with any questions or concerns at any time, at [phone].

## 2015-02-18 NOTE — Progress Notes (Signed)
Patient ID: [name], male   DOB: 22-Nov-1975, 39 y.o.   MRN: [id number]    Subjective: CC: back pain  HPI: Patient is a 39 y.o. male with a past medical history of CAD s/p CABGs, anxiety, and chronic back pain presenting to clinic today for back pain.  Approximately 4 years ago, he started having intermittent back pain. Notes it normally occurs once q16months. States for the last month he's had recurrence for this pain;. Pain starts at tailbone and radiates to the neck. Pain is sometimes tight, aching, and sometimes pulling. Notes stabbing sensation inbetween the shoulder blades.  If he walks a lot, he'll have pain radiation down to the legs, R>L. He also notes leg numbness b/l x 2.5weeks. No weakness. L arm is at it's baseline numbness.  Patient feels the muscle relaxers helped quite a bit. Does not like percocet. No saddle paresthesias, bowel/bladder incontinence.   Anxiety: worse with pain. Feels the increase in klonopin has been improved.   Social History: previous smoker and uses snuff.    ROS: All other systems reviewed and are negative.  Past Medical History Patient Active Problem List   Diagnosis Date Noted  . Right kidney stone 08/06/2014  . Low back pain 03/01/2014  . Neck pain 03/01/2014  . History of laparoscopic appendectomy 09/18/2013  . Chest pain with moderate risk of acute coronary syndrome 09/17/2013  . Myalgia and myositis, unspecified 05/29/2013  . Bilateral leg edema 02/21/2013  . Dental caries 01/19/2013  . Angina pectoris 12/07/2012  . Dizzy 12/07/2012  . Hypokalemia 12/07/2012  . Sciatica of right side 11/25/2012  . Intervertebral disk disease 07/27/2012  . Obesity, Class II, BMI 35-39.9 03/18/2012  . Anxiety 03/18/2012  . HTN (hypertension) 02/09/2012  . CAD - CABG '09, OM3 stent March 2010, last cath 4/13- medical Rx 10/29/2011  . Hyperlipidemia, mixed 05/30/2008  . TOBACCO USE 05/30/2008    Medications- reviewed and updated Current  Outpatient Prescriptions  Medication Sig Dispense Refill  . atorvastatin (LIPITOR) 80 MG tablet TAKE ONE TABLET BY MOUTH ONCE DAILY AT  6  PM 30 tablet 5  . baclofen (LIORESAL) 10 MG tablet Take 1 tablet (10 mg total) by mouth 3 (three) times daily. 90 tablet 1  . clonazePAM (KLONOPIN) 1 MG tablet Take 0.5-1 tablets (0.5-1 mg total) by mouth 2 (two) times daily as needed for anxiety. 60 tablet 1  . clopidogrel (PLAVIX) 75 MG tablet TAKE ONE TABLET BY MOUTH IN THE MORNING 30 tablet 5  . fenofibrate (TRICOR) 48 MG tablet TAKE ONE TABLET BY MOUTH ONCE DAILY 30 tablet 5  . isosorbide mononitrate (IMDUR) 30 MG 24 hr tablet TAKE ONE TABLET BY MOUTH ONCE DAILY 30 tablet 5  . losartan (COZAAR) 100 MG tablet Take 1 tablet (100 mg total) by mouth daily. 30 tablet 3  . meloxicam (MOBIC) 7.5 MG tablet Take 7.5 mg by mouth daily as needed for pain.    . metoprolol tartrate (LOPRESSOR) 25 MG tablet TAKE ONE TABLET BY MOUTH TWICE DAILY 60 tablet 5  . Multiple Vitamin (MULITIVITAMIN WITH MINERALS) TABS Take 1 tablet by mouth every morning.     . nitroGLYCERIN (NITROSTAT) 0.4 MG SL tablet Place 1 tablet (0.4 mg total) under the tongue every 5 (five) minutes as needed for chest pain. 25 tablet 2  . oxyCODONE-acetaminophen (PERCOCET) 5-325 MG per tablet Take 1 tablet by mouth every 8 (eight) hours as needed for severe pain. 45 tablet 0  . Probiotic Product (PRO-BIOTIC BLEND)  CAPS Take 1 capsule by mouth daily. Helps with digestion    . RANEXA 500 MG 12 hr tablet TAKE ONE TABLET BY MOUTH TWICE DAILY 180 tablet 0   No current facility-administered medications for this visit.    Objective: Office vital signs reviewed. BP 141/95 mmHg  Pulse 77  Temp(Src) 98.1 F (36.7 C) (Oral)  Ht 6' (1.829 m)  Wt 275 lb (124.739 kg)  BMI 37.29 kg/m2   Physical Examination:  General: Awake, alert, well- nourished, NAD Cardio: RRR, no m/r/g noted. No pitting edema.  Pulm: No increased WOB.  CTAB, without wheezes, rhonchi  or crackles noted.  MSK: Diffuse tenderness in midline and paraspinal muscles, most prominent in the lumbar region but also in the thoracic and cervical region. Full ROM. Negative straight leg raise (note pain on the R with lifting either leg). 4+/5 strength in the R LE, 5/5 in the L LE. 5/5 strength in the UE b/l.  Skin: dry, intact, no rashes or lesions Neuro: Endorses mildly decreased sensation to light touch on the R compared to the L.  DTRs 2/4  Assessment/Plan: Intervertebral disk disease Chronic intermittent back pain with significant spasm on exam with h/o improvement with baclofen. Some radicular symptoms reported, however negative SLR on exam. No saddle paresthesias, bowel/bladder incontinence. [place] [facility] 2013. - Rx for baclofen - PT referral to assist with stretching exercises  - In the future, consider ortho referral however he'd have to be cleared by cardiology.  - f/u in 1 month.  - RTC precautions discussed.      Orders Placed This Encounter  Procedures  . Ambulatory referral to Physical Therapy    Referral Priority:  Routine    Referral Type:  Physical Medicine    Referral Reason:  Specialty Services Required    Requested Specialty:  Physical Therapy    Number of Visits Requested:  1    Meds ordered this encounter  Medications  . baclofen (LIORESAL) 10 MG tablet    Sig: Take 1 tablet (10 mg total) by mouth 3 (three) times daily.    Dispense:  90 tablet    Refill:  1  . clonazePAM (KLONOPIN) 1 MG tablet    Sig: Take 0.5-1 tablets (0.5-1 mg total) by mouth 2 (two) times daily as needed for anxiety.    Dispense:  60 tablet    Refill:  1    Joanna Puff PGY-2, [facility]

## 2015-02-18 NOTE — Assessment & Plan Note (Signed)
Chronic intermittent back pain with significant spasm on exam with h/o improvement with baclofen. Some radicular symptoms reported, however negative SLR on exam. No saddle paresthesias, bowel/bladder incontinence. [place] MRI 2013. - Rx for baclofen - PT referral to assist with stretching exercises  - In the future, consider ortho referral however he'd have to be cleared by cardiology.  - f/u in 1 month.  - RTC precautions discussed.

## 2015-02-19 ENCOUNTER — Emergency Department (HOSPITAL_COMMUNITY)
Admission: EM | Admit: 2015-02-19 | Discharge: 2015-02-19 | Disposition: A | Discharge status: Home / Self Care | Payer: Medicare Other | Attending: Emergency Medicine | Admitting: Emergency Medicine

## 2015-02-19 ENCOUNTER — Emergency Department (HOSPITAL_COMMUNITY): Payer: Medicare Other

## 2015-02-19 ENCOUNTER — Encounter (HOSPITAL_COMMUNITY): Payer: Self-pay | Admitting: *Deleted

## 2015-02-19 ENCOUNTER — Other Ambulatory Visit: Payer: Self-pay

## 2015-02-19 DIAGNOSIS — Z720 Tobacco use: Secondary | ICD-10-CM | POA: Insufficient documentation

## 2015-02-19 DIAGNOSIS — E785 Hyperlipidemia, unspecified: Secondary | ICD-10-CM | POA: Diagnosis not present

## 2015-02-19 DIAGNOSIS — R112 Nausea with vomiting, unspecified: Secondary | ICD-10-CM | POA: Diagnosis not present

## 2015-02-19 DIAGNOSIS — N200 Calculus of kidney: Secondary | ICD-10-CM | POA: Insufficient documentation

## 2015-02-19 DIAGNOSIS — Z951 Presence of aortocoronary bypass graft: Secondary | ICD-10-CM | POA: Diagnosis not present

## 2015-02-19 DIAGNOSIS — R110 Nausea: Secondary | ICD-10-CM | POA: Diagnosis not present

## 2015-02-19 DIAGNOSIS — K2970 Gastritis, unspecified, without bleeding: Secondary | ICD-10-CM | POA: Diagnosis not present

## 2015-02-19 DIAGNOSIS — Z9889 Other specified postprocedural states: Secondary | ICD-10-CM | POA: Insufficient documentation

## 2015-02-19 DIAGNOSIS — Z79899 Other long term (current) drug therapy: Secondary | ICD-10-CM | POA: Diagnosis not present

## 2015-02-19 DIAGNOSIS — I10 Essential (primary) hypertension: Secondary | ICD-10-CM | POA: Diagnosis not present

## 2015-02-19 DIAGNOSIS — R52 Pain, unspecified: Secondary | ICD-10-CM

## 2015-02-19 DIAGNOSIS — I252 Old myocardial infarction: Secondary | ICD-10-CM | POA: Diagnosis not present

## 2015-02-19 DIAGNOSIS — R319 Hematuria, unspecified: Secondary | ICD-10-CM | POA: Diagnosis not present

## 2015-02-19 DIAGNOSIS — R300 Dysuria: Secondary | ICD-10-CM | POA: Diagnosis present

## 2015-02-19 DIAGNOSIS — N1330 Unspecified hydronephrosis: Secondary | ICD-10-CM | POA: Diagnosis not present

## 2015-02-19 LAB — CBC WITH DIFFERENTIAL/PLATELET
Basophils Absolute: 0 10*3/uL (ref 0.0–0.1)
Basophils Relative: 0 % (ref 0–1)
EOS PCT: 1 % (ref 0–5)
Eosinophils Absolute: 0.1 10*3/uL (ref 0.0–0.7)
HEMATOCRIT: 42.8 % (ref 39.0–52.0)
Hemoglobin: 14.3 g/dL (ref 13.0–17.0)
LYMPHS ABS: 2.8 10*3/uL (ref 0.7–4.0)
LYMPHS PCT: 24 % (ref 12–46)
MCH: 31.2 pg (ref 26.0–34.0)
MCHC: 33.4 g/dL (ref 30.0–36.0)
MCV: 93.2 fL (ref 78.0–100.0)
MONOS PCT: 4 % (ref 3–12)
Monocytes Absolute: 0.5 10*3/uL (ref 0.1–1.0)
NEUTROS ABS: 8.2 10*3/uL — AB (ref 1.7–7.7)
Neutrophils Relative %: 71 % (ref 43–77)
Platelets: 276 10*3/uL (ref 150–400)
RBC: 4.59 MIL/uL (ref 4.22–5.81)
RDW: 13.6 % (ref 11.5–15.5)
WBC: 11.5 10*3/uL — ABNORMAL HIGH (ref 4.0–10.5)

## 2015-02-19 LAB — URINE MICROSCOPIC-ADD ON

## 2015-02-19 LAB — COMPREHENSIVE METABOLIC PANEL
ALBUMIN: 4.4 g/dL (ref 3.5–5.0)
ALK PHOS: 53 U/L (ref 38–126)
ALT: 22 U/L (ref 17–63)
AST: 19 U/L (ref 15–41)
Anion gap: 9 (ref 5–15)
BILIRUBIN TOTAL: 0.7 mg/dL (ref 0.3–1.2)
BUN: 12 mg/dL (ref 6–20)
CALCIUM: 9.5 mg/dL (ref 8.9–10.3)
CO2: 24 mmol/L (ref 22–32)
CREATININE: 0.97 mg/dL (ref 0.61–1.24)
Chloride: 107 mmol/L (ref 101–111)
Glucose, Bld: 125 mg/dL — ABNORMAL HIGH (ref 65–99)
POTASSIUM: 3.8 mmol/L (ref 3.5–5.1)
Sodium: 140 mmol/L (ref 135–145)
Total Protein: 7.5 g/dL (ref 6.5–8.1)

## 2015-02-19 LAB — URINALYSIS, ROUTINE W REFLEX MICROSCOPIC
Bilirubin Urine: NEGATIVE
Glucose, UA: NEGATIVE mg/dL
Ketones, ur: NEGATIVE mg/dL
NITRITE: NEGATIVE
Protein, ur: 30 mg/dL — AB
Specific Gravity, Urine: 1.023 (ref 1.005–1.030)
Urobilinogen, UA: 0.2 mg/dL (ref 0.0–1.0)
pH: 6.5 (ref 5.0–8.0)

## 2015-02-19 MED ORDER — MORPHINE SULFATE 4 MG/ML IJ SOLN
4.0000 mg | Freq: Once | INTRAMUSCULAR | Status: AC
  Administered 2015-02-19: 4 mg via INTRAVENOUS
  Filled 2015-02-19: qty 1

## 2015-02-19 MED ORDER — KETOROLAC TROMETHAMINE 30 MG/ML IJ SOLN
30.0000 mg | Freq: Once | INTRAMUSCULAR | Status: AC
  Administered 2015-02-19: 30 mg via INTRAVENOUS
  Filled 2015-02-19: qty 1

## 2015-02-19 MED ORDER — HYDROCODONE-ACETAMINOPHEN 5-325 MG PO TABS: 1.0000 | ORAL_TABLET | ORAL | Status: DC | PRN

## 2015-02-19 MED ORDER — SODIUM CHLORIDE 0.9 % IV BOLUS (SEPSIS)
1000.0000 mL | Freq: Once | INTRAVENOUS | Status: AC
  Administered 2015-02-19: 1000 mL via INTRAVENOUS

## 2015-02-19 MED ORDER — ONDANSETRON HCL 4 MG/2ML IJ SOLN
4.0000 mg | Freq: Once | INTRAMUSCULAR | Status: AC
  Administered 2015-02-19: 4 mg via INTRAVENOUS
  Filled 2015-02-19: qty 2

## 2015-02-19 MED ORDER — TAMSULOSIN HCL 0.4 MG PO CAPS: 0.4000 mg | ORAL_CAPSULE | Freq: Every day | ORAL | Status: DC

## 2015-02-19 MED ORDER — ONDANSETRON 4 MG PO TBDP: 4.0000 mg | ORAL_TABLET | Freq: Three times a day (TID) | ORAL | Status: DC | PRN

## 2015-02-19 NOTE — ED Provider Notes (Signed)
CSN: [id number]     Arrival date & time 02/19/15  1539 History   First MD Initiated Contact with Patient 02/19/15 1555     Chief Complaint  Patient presents with  . Flank Pain    right groing pain and flank pain  . Dysuria   [name] is a 39 y.o. male with a history of an MI, CABG, hyperlipidemia and kidney stones presents to the emergency department complaining of right flank pain that radiates into his right lower quadrant ongoing since yesterday. He currently lives in an 8 out of 10 aching steady pain that can fluctuate in intensity without warning. He also reports decreased urination and blood in his urine today. He reports having some dysuria this morning. He reports he had vomiting earlier today but this is since resolved. He denies any other abdominal pain. Patient reports he also has some radiating pain up to his chest intermittently. He denies palpitations or shortness of breath. He reports the pain can also intermittently she does right testicle but he has no pain in his right testicle currently. Patient denies fevers, chills, hematemesis, diarrhea, hematochezia, urinary frequency, urinary urgency, shortness of breath, palpitations or rashes. Patient is not followed by a urologist. The patient denies history of lithotripsy or stenting.  His previous abdominal surgical history includes an appendectomy.  (Consider location/radiation/quality/duration/timing/severity/associated sxs/prior Treatment) HPI  Past Medical History  Diagnosis Date  . Hypertension   . MI (myocardial infarction)   . Hx of CABG   . Heart attack   . Bronchitis   . Tobacco user   . Hyperlipidemia   . MYOCARDIAL INFARCTION 10/13/2008    Qualifier: Diagnosis of  By: Juanda Chance, MD, Johny Chess    Past Surgical History  Procedure Laterality Date  . Coronary artery bypass graft      status post diaphragmatic wall infarction Rx BMS RCA 03-11-08   . Orthopedic surgery    . Coronary stent placement    .  Laparoscopic appendectomy N/A 09/17/2013    Procedure: APPENDECTOMY LAPAROSCOPIC;  Surgeon: Shelly Rubenstein, MD;  Location: [facility] OR;  Service: General;  Laterality: N/A;  . Appendectomy    . Left heart catheterization with coronary/graft angiogram N/A 10/19/2011    Procedure: LEFT HEART CATHETERIZATION WITH Isabel Caprice;  Surgeon: Herby Abraham, MD;  Location: [facility] CATH LAB;  Service: Cardiovascular;  Laterality: N/A;   Family History  Problem Relation Age of Onset  . Cancer Mother   . Cancer Sister   . Diabetes Mother   . Diabetes Sister   . Hyperlipidemia Mother   . Hypertension Mother   . Hyperlipidemia Sister   . Hypertension Sister   . Early death Mother   . Early death Sister    History  Substance Use Topics  . Smoking status: Heavy Tobacco Smoker -- 0.50 packs/day for 20 years    Types: Cigarettes  . Smokeless tobacco: Current User    Types: Snuff     Comment: E-Cigarette  . Alcohol Use: No     Comment: maybe 3-4 drinks a week, occasionally.    Review of Systems  Constitutional: Negative for fever and chills.  HENT: Negative for congestion and sore throat.   Eyes: Negative for visual disturbance.  Respiratory: Negative for cough, shortness of breath and wheezing.   Cardiovascular: Negative for chest pain and palpitations.  Gastrointestinal: Positive for nausea, vomiting and abdominal pain. Negative for diarrhea and blood in stool.  Genitourinary: Positive for dysuria, hematuria, flank pain and  decreased urine volume. Negative for urgency, frequency, penile swelling, difficulty urinating and penile pain.  Musculoskeletal: Negative for back pain and neck pain.  Skin: Negative for rash.  Neurological: Negative for syncope, weakness, light-headedness, numbness and headaches.      Allergies  Codeine and Celexa  Home Medications   Prior to Admission medications   Medication Sig Start Date End Date Taking? Authorizing Provider  atorvastatin (LIPITOR) 80  MG tablet TAKE ONE TABLET BY MOUTH ONCE DAILY AT  6  PM 10/01/14  Yes Stephanie Coup Street, MD  baclofen (LIORESAL) 10 MG tablet Take 1 tablet (10 mg total) by mouth 3 (three) times daily. Patient taking differently: Take 10 mg by mouth 3 (three) times daily as needed for muscle spasms.  02/18/15  Yes Joanna Puff, MD  bismuth subsalicylate (PEPTO BISMOL) 262 MG/15ML suspension Take 30 mLs by mouth every 6 (six) hours as needed for indigestion.   Yes Historical Provider, MD  clonazePAM (KLONOPIN) 1 MG tablet Take 0.5-1 tablets (0.5-1 mg total) by mouth 2 (two) times daily as needed for anxiety. Patient taking differently: Take 1 mg by mouth 2 (two) times daily.  02/18/15  Yes Joanna Puff, MD  clopidogrel (PLAVIX) 75 MG tablet TAKE ONE TABLET BY MOUTH IN THE MORNING 10/01/14  Yes Stephanie Coup Street, MD  fenofibrate (TRICOR) 48 MG tablet TAKE ONE TABLET BY MOUTH ONCE DAILY 10/01/14  Yes Stephanie Coup Street, MD  isosorbide mononitrate (IMDUR) 30 MG 24 hr tablet TAKE ONE TABLET BY MOUTH ONCE DAILY 10/01/14  Yes Stephanie Coup Street, MD  losartan (COZAAR) 100 MG tablet Take 1 tablet (100 mg total) by mouth daily. 12/28/14  Yes Stephanie Coup Street, MD  metoprolol tartrate (LOPRESSOR) 25 MG tablet TAKE ONE TABLET BY MOUTH TWICE DAILY 10/01/14  Yes Stephanie Coup Street, MD  Multiple Vitamin (MULITIVITAMIN WITH MINERALS) TABS Take 1 tablet by mouth every morning.    Yes Historical Provider, MD  Probiotic Product (PRO-BIOTIC BLEND) CAPS Take 1 capsule by mouth daily. Helps with digestion   Yes Historical Provider, MD  RANEXA 500 MG 12 hr tablet TAKE ONE TABLET BY MOUTH TWICE DAILY 02/04/15  Yes Joanna Puff, MD  HYDROcodone-acetaminophen (NORCO/VICODIN) 5-325 MG per tablet Take 1 tablet by mouth every 4 (four) hours as needed for moderate pain or severe pain. 02/19/15   Everlene Farrier, PA-C  nitroGLYCERIN (NITROSTAT) 0.4 MG SL tablet Place 1 tablet (0.4 mg total) under the tongue every 5 (five) minutes as  needed for chest pain. 08/08/13   Laurey Morale, MD  ondansetron (ZOFRAN ODT) 4 MG disintegrating tablet Take 1 tablet (4 mg total) by mouth every 8 (eight) hours as needed for nausea or vomiting. 02/19/15   Everlene Farrier, PA-C  oxyCODONE-acetaminophen (PERCOCET) 5-325 MG per tablet Take 1 tablet by mouth every 8 (eight) hours as needed for severe pain. Patient not taking: Reported on 02/19/2015 01/10/15   Stephanie Coup Street, MD  tamsulosin (FLOMAX) 0.4 MG CAPS capsule Take 1 capsule (0.4 mg total) by mouth daily. 02/19/15   Everlene Farrier, PA-C   BP 112/75 mmHg  Pulse 72  Temp(Src) 98.6 F (37 C) (Oral)  Resp 16  SpO2 95% Physical Exam  Constitutional: He is oriented to person, place, and time. He appears well-developed and well-nourished. No distress.  Nontoxic appearing.  HENT:  Head: Normocephalic and atraumatic.  Mouth/Throat: Oropharynx is clear and moist.  Eyes: Conjunctivae are normal. Pupils are equal, round, and reactive to light. Right eye exhibits  no discharge. Left eye exhibits no discharge.  Neck: Normal range of motion. Neck supple. No JVD present. No tracheal deviation present.  Cardiovascular: Normal rate, regular rhythm, normal heart sounds and intact distal pulses.  Exam reveals no gallop and no friction rub.   No murmur heard. Pulmonary/Chest: Effort normal and breath sounds normal. No respiratory distress. He has no wheezes. He has no rales. He exhibits no tenderness.  Lungs are clear to auscultation bilaterally.  Abdominal: Soft. Bowel sounds are normal. He exhibits no distension and no mass. There is tenderness. There is no rebound and no guarding.  Abdomen soft. Bowel sounds are present. Patient has mild right lower quadrant tenderness to palpation. He has right-sided flank tenderness. No rebound tenderness. Negatives psoas and obturator sign.  Musculoskeletal: He exhibits no edema.  Lymphadenopathy:    He has no cervical adenopathy.  Neurological: He is alert and  oriented to person, place, and time. Coordination normal.  Skin: Skin is warm and dry. No rash noted. He is not diaphoretic. No erythema. No pallor.  Psychiatric: He has a normal mood and affect. His behavior is normal.  Nursing note and vitals reviewed.   ED Course  Procedures (including critical care time) Labs Review Labs Reviewed  COMPREHENSIVE METABOLIC PANEL - Abnormal; Notable for the following:    Glucose, Bld 125 (*)    All other components within normal limits  CBC WITH DIFFERENTIAL/PLATELET - Abnormal; Notable for the following:    WBC 11.5 (*)    Neutro Abs 8.2 (*)    All other components within normal limits  URINALYSIS, ROUTINE W REFLEX MICROSCOPIC (NOT AT [facility]) - Abnormal; Notable for the following:    Color, Urine AMBER (*)    APPearance CLOUDY (*)    Hgb urine dipstick LARGE (*)    Protein, ur 30 (*)    Leukocytes, UA SMALL (*)    All other components within normal limits  URINE CULTURE  URINE MICROSCOPIC-ADD ON    Imaging Review US Renal  02/19/2015   CLINICAL DATA:  Right flank pain and hematuria for 1 day.  EXAM: RENAL / URINARY TRACT ULTRASOUND COMPLETE  COMPARISON:  CT scan 07/23/2014  FINDINGS: Right Kidney:  Length: 13.5 cm. Mild right-sided hydronephrosis. The lower pole calculus that was seen on the prior CT scan is not visualized for certain on the ultrasound. Normal renal cortical thickness and echogenicity without focal lesions.  Left Kidney:  Length: 12.6 cm. Normal renal cortical thickness and echogenicity without focal lesions or hydronephrosis.  Bladder:  Appears normal for degree of bladder distention. Bilateral ureteral jets are noted.  IMPRESSION: 1. Mild right-sided hydronephrosis.  No definite renal calculi. 2. Normal left kidney. 3. Normal bladder.  Bilateral ureteral jets are noted.   Electronically Signed   By: Rudie Meyer M.D.   On: 02/19/2015 21:13     EKG Interpretation None      Filed Vitals:   02/19/15 1551 02/19/15 1554 02/19/15  1837 02/19/15 2131  BP:  161/82 109/64 112/75  Pulse:  87 73 72  Temp:  98.6 F (37 C)    TempSrc:  Oral    Resp:  17 16 16   SpO2: 97% 97% 96% 95%     MDM   Meds given in ED:  Medications  sodium chloride 0.9 % bolus 1,000 mL (0 mLs Intravenous Stopped 02/19/15 1830)  ondansetron (ZOFRAN) injection 4 mg (4 mg Intravenous Given 02/19/15 1646)  morphine 4 MG/ML injection 4 mg (4 mg Intravenous Given  02/19/15 1646)  ketorolac (TORADOL) 30 MG/ML injection 30 mg (30 mg Intravenous Given 02/19/15 1930)  morphine 4 MG/ML injection 4 mg (4 mg Intravenous Given 02/19/15 2125)  ondansetron (ZOFRAN) injection 4 mg (4 mg Intravenous Given 02/19/15 2208)    New Prescriptions   HYDROCODONE-ACETAMINOPHEN (NORCO/VICODIN) 5-325 MG PER TABLET    Take 1 tablet by mouth every 4 (four) hours as needed for moderate pain or severe pain.   ONDANSETRON (ZOFRAN ODT) 4 MG DISINTEGRATING TABLET    Take 1 tablet (4 mg total) by mouth every 8 (eight) hours as needed for nausea or vomiting.   TAMSULOSIN (FLOMAX) 0.4 MG CAPS CAPSULE    Take 1 capsule (0.4 mg total) by mouth daily.    Final diagnoses:  Right kidney stone    Patient is a 39 year old male who presented with right flank pain. Urinalysis shows large hemoglobin with small leukocytes. 0-2 white blood cells on microscopic. Urine Sent for Culture. Renal ultrasound indicates mild right-sided hydronephrosis or definite renal calculi identified. Normal left kidney. Normal bladder and bilateral ureteral jets are noted. CBC is remarkable only for a white count of 11.5. CMP indicates normal kidney function is unremarkable. Patient's pain treated and improved. Prior to discharge the patient has tolerated by mouth orange juice and graham crackers. He reports feeling ready for discharge. We'll discharge with prescriptions were Norco, Zofran and Flomax. I encouraged him to follow-up with urology and his primary care provider. I advised the patient to follow-up with their primary  care provider this week. I advised the patient to return to the emergency department with new or worsening symptoms or new concerns. The patient verbalized understanding and agreement with plan.    This patient was discussed with Dr. Dalene Seltzer who agrees with assessment and plan.     Everlene Farrier, PA-C 02/19/15 1610  Alvira Monday, MD 02/20/15 [phone]

## 2015-02-19 NOTE — ED Notes (Signed)
Pt. Bladder scan was 126cc. Nurse was notified.

## 2015-02-19 NOTE — Discharge Instructions (Signed)

## 2015-02-19 NOTE — ED Notes (Addendum)
EMS contacted d/t increased pain in right groin/testicle/right flank area. Pt reports pain is 10/10. Pt feels perhaps he may have a kidney stone d/t diagnosis x 2 years ago. Pt was seen by PCP yesterday and diagnosed back spasms, medications were prescribed but pt has not had them filled at the pharmacy as of today. Pt has reported difficulty with urination along with blood. Hx cardiac bypass x 4 vessels. 150 Mcg Fentanyl given in field. Pt was able to ambulate without assistance.

## 2015-02-20 ENCOUNTER — Telehealth: Payer: Self-pay | Admitting: Family Medicine

## 2015-02-20 DIAGNOSIS — N1330 Unspecified hydronephrosis: Secondary | ICD-10-CM

## 2015-02-20 NOTE — Telephone Encounter (Signed)
Was referred to urology by ED yesterday. Needs referral to come from PCP office.

## 2015-02-20 NOTE — Telephone Encounter (Signed)
Pt calling and needs a referral placed to [facility] Address: [address] [place], [place], [place] [address]; Phone: [phone]. He has not made an appt yet and does not know which doctor he will be seeing. Pt would like to be contacted once complete.  Thank you, Dorothey Baseman, ASA

## 2015-02-20 NOTE — Telephone Encounter (Signed)
Referral made.  Thanks, Joanna Puff, MD [facility] Resident  02/20/2015, 1:37 PM

## 2015-02-21 LAB — URINE CULTURE

## 2015-03-04 ENCOUNTER — Ambulatory Visit (INDEPENDENT_AMBULATORY_CARE_PROVIDER_SITE_OTHER): Payer: Medicare Other | Admitting: Family Medicine

## 2015-03-04 ENCOUNTER — Encounter: Payer: Self-pay | Admitting: Family Medicine

## 2015-03-04 VITALS — BP 141/92 | HR 77 | Temp 97.7°F | Ht 72.0 in | Wt 275.3 lb

## 2015-03-04 DIAGNOSIS — N200 Calculus of kidney: Secondary | ICD-10-CM | POA: Diagnosis present

## 2015-03-04 DIAGNOSIS — I25119 Atherosclerotic heart disease of native coronary artery with unspecified angina pectoris: Secondary | ICD-10-CM | POA: Diagnosis not present

## 2015-03-04 MED ORDER — TAMSULOSIN HCL 0.4 MG PO CAPS: 0.8000 mg | ORAL_CAPSULE | Freq: Every day | ORAL | Status: DC

## 2015-03-04 MED ORDER — OXYCODONE-ACETAMINOPHEN 5-325 MG PO TABS: 1.0000 | ORAL_TABLET | Freq: Three times a day (TID) | ORAL | Status: DC | PRN

## 2015-03-04 NOTE — Patient Instructions (Signed)
CALL ROBIN AT THE UROLOGY OFFICE TODAY TO SCHEDULE AN APPOINTMENT, HOPEFULLY THEY'LL HAVE AN OPENING TOMORROW! I have refilled your pain medication. Try increasing the Flomax to 2 tablets (0.8mg ) to see if this helps with your urine stream. Please go to the ED immediate if you have a decrease in your urine stream, fever, chills, pain not controlled by pain medication, or unable to drink/eat due to nausea or vomiting.   Kidney Stones Kidney stones (urolithiasis) are solid masses that form inside your kidneys. The intense pain is caused by the stone moving through the kidney, ureter, bladder, and urethra (urinary tract). When the stone moves, the ureter starts to spasm around the stone. The stone is usually passed in your pee (urine).  HOME CARE  Drink enough fluids to keep your pee clear or pale yellow. This helps to get the stone out.  Strain all pee through the provided strainer. Do not pee without peeing through the strainer, not even once. If you pee the stone out, catch it in the strainer. The stone may be as small as a grain of salt. Take this to your doctor. This will help your doctor figure out what you can do to try to prevent more kidney stones.  Only take medicine as told by your doctor.  Follow up with your doctor as told.  Get follow-up X-rays as told by your doctor. GET HELP IF: You have pain that gets worse even if you have been taking pain medicine. GET HELP RIGHT AWAY IF:   Your pain does not get better with medicine.  You have a fever or shaking chills.  Your pain increases and gets worse over 18 hours.  You have new belly (abdominal) pain.  You feel faint or pass out.  You are unable to pee. MAKE SURE YOU:   Understand these instructions.  Will watch your condition.  Will get help right away if you are not doing well or get worse. Document Released: 12/23/2007 Document Revised: 03/08/2013 Document Reviewed: 12/07/2012 [facility] Patient Information 2015  [place], [place]. This information is not intended to replace advice given to you by your health care provider. Make sure you discuss any questions you have with your health care provider.

## 2015-03-04 NOTE — Assessment & Plan Note (Addendum)
Patient presenting with R sided flank pain and CVA tenderness on exam. He was previously noted to have a R lower pole kidney stone on CT. Renal U/S in the ED on 8/2 revealed mild R hydronephrosis without definite stone. Per patient, his symptoms are all stable (without fevers, chills, or urinary retention), however he is stressed that his urology appt is not until Sept 28th. I called [facility] to see if we could get him an earlier appt and the triage nurse, Zella Ball, stated that he had an appt on 8/5 that he cancelled and they've tried getting him earlier. Per her report, if he calls, they can get him in much sooner. Dicussed hospital admission vs close urology follow up with the patient and he was agreeable to waiting for urology follow up. - Stressed importance of pt following up with urology and not cancelling  - Suggested pt increasing Flomax to 2 capsules (0.8mg ) daily to see if this helps with stream strength. - Refilled Vicodin today.  - Discussed strict reasons to return to the ED: inability to urinate, fever, chills, pain not controlled by pain medication, or unable to drink/eat due to nausea or vomiting. Patient voiced understanding.

## 2015-03-04 NOTE — Progress Notes (Signed)
Patient ID: [name], male   DOB: 08-15-1975, 39 y.o.   MRN: [id number]    Subjective: CC: kidney stone HPI: Patient is a 39 y.o. male with a past medical history of CAD s/p CABGs, anxiety, and chronic back pain presenting to clinic today for .  Kidney stone: Patient had sudden onset R sided flank pain on 02/19/15. He went to the ED and was given Flomax and Norco for presumed R kidney stone. Pain is now more constant than previously but controlled with pain Norco. Pain is 10/10 R sided flank pain without radiation.  Has had a weak stream since 02/19/15, sometimes he has to sit because he doesn't have control of his urine stream. States this has not changed since 8/2 and notes as he drinks more water throughout the day, his stream improves. He also feels the Flomax is helpful but wish it was stronger.  He endorses continuing to urinate blood but denies passing blood clots. He has been straining his urine without noticing any stone passage.  He is drinking a lot of water and avoiding sodas  Eats once a day due to pain. Was having N/V initially; was given Zofran ODT with some improvement. He now intermittently feels nauseous but denies emesis. Had chills the first few days, but denies fevers or chills recently. No abdominal pain.   Social History: previous smoker, uses snuff  ROS: All other systems reviewed and are negative.  Past Medical History Patient Active Problem List   Diagnosis Date Noted  . Right kidney stone 08/06/2014  . Low back pain 03/01/2014  . Neck pain 03/01/2014  . History of laparoscopic appendectomy 09/18/2013  . Chest pain with moderate risk of acute coronary syndrome 09/17/2013  . Myalgia and myositis, unspecified 05/29/2013  . Bilateral leg edema 02/21/2013  . Dental caries 01/19/2013  . Angina pectoris 12/07/2012  . Dizzy 12/07/2012  . Hypokalemia 12/07/2012  . Sciatica of right side 11/25/2012  . Intervertebral disk disease 07/27/2012  . Obesity, Class II, BMI  35-39.9 03/18/2012  . Anxiety 03/18/2012  . HTN (hypertension) 02/09/2012  . CAD - CABG '09, OM3 stent March 2010, last cath 4/13- medical Rx 10/29/2011  . Hyperlipidemia, mixed 05/30/2008  . TOBACCO USE 05/30/2008    Medications- reviewed and updated Current Outpatient Prescriptions  Medication Sig Dispense Refill  . atorvastatin (LIPITOR) 80 MG tablet TAKE ONE TABLET BY MOUTH ONCE DAILY AT  6  PM 30 tablet 5  . baclofen (LIORESAL) 10 MG tablet Take 1 tablet (10 mg total) by mouth 3 (three) times daily. (Patient taking differently: Take 10 mg by mouth 3 (three) times daily as needed for muscle spasms. ) 90 tablet 1  . bismuth subsalicylate (PEPTO BISMOL) 262 MG/15ML suspension Take 30 mLs by mouth every 6 (six) hours as needed for indigestion.    . clonazePAM (KLONOPIN) 1 MG tablet Take 0.5-1 tablets (0.5-1 mg total) by mouth 2 (two) times daily as needed for anxiety. (Patient taking differently: Take 1 mg by mouth 2 (two) times daily. ) 60 tablet 1  . clopidogrel (PLAVIX) 75 MG tablet TAKE ONE TABLET BY MOUTH IN THE MORNING 30 tablet 5  . fenofibrate (TRICOR) 48 MG tablet TAKE ONE TABLET BY MOUTH ONCE DAILY 30 tablet 5  . HYDROcodone-acetaminophen (NORCO/VICODIN) 5-325 MG per tablet Take 1 tablet by mouth every 4 (four) hours as needed for moderate pain or severe pain. 15 tablet 0  . isosorbide mononitrate (IMDUR) 30 MG 24 hr tablet TAKE ONE TABLET  BY MOUTH ONCE DAILY 30 tablet 5  . losartan (COZAAR) 100 MG tablet Take 1 tablet (100 mg total) by mouth daily. 30 tablet 3  . metoprolol tartrate (LOPRESSOR) 25 MG tablet TAKE ONE TABLET BY MOUTH TWICE DAILY 60 tablet 5  . Multiple Vitamin (MULITIVITAMIN WITH MINERALS) TABS Take 1 tablet by mouth every morning.     . nitroGLYCERIN (NITROSTAT) 0.4 MG SL tablet Place 1 tablet (0.4 mg total) under the tongue every 5 (five) minutes as needed for chest pain. 25 tablet 2  . ondansetron (ZOFRAN ODT) 4 MG disintegrating tablet Take 1 tablet (4 mg total)  by mouth every 8 (eight) hours as needed for nausea or vomiting. 12 tablet 0  . oxyCODONE-acetaminophen (PERCOCET) 5-325 MG per tablet Take 1 tablet by mouth every 8 (eight) hours as needed for severe pain. 45 tablet 0  . Probiotic Product (PRO-BIOTIC BLEND) CAPS Take 1 capsule by mouth daily. Helps with digestion    . RANEXA 500 MG 12 hr tablet TAKE ONE TABLET BY MOUTH TWICE DAILY 180 tablet 0  . tamsulosin (FLOMAX) 0.4 MG CAPS capsule Take 2 capsules (0.8 mg total) by mouth daily. 20 capsule 0   No current facility-administered medications for this visit.    Objective: Office vital signs reviewed. BP 141/92 mmHg  Pulse 77  Temp(Src) 97.7 F (36.5 C) (Oral)  Ht 6' (1.829 m)  Wt 275 lb 4.8 oz (124.875 kg)  BMI 37.33 kg/m2   Physical Examination:  General: Awake, alert, well- nourished. In mild distress.  Cardio: RRR, no m/r/g noted.  Pulm: No increased WOB.  CTAB, without wheezes, rhonchi or crackles noted.  GI: soft, NT/ND,+BS x4. +CVA tenderness of the R  Assessment/Plan: Right kidney stone Patient presenting with R sided flank pain and CVA tenderness on exam. He was previously noted to have a R lower pole kidney stone on CT. Renal U/S in the ED on 8/2 revealed mild R hydronephrosis without definite stone. Per patient, his symptoms are all stable (without fevers, chills, or urinary retention), however he is stressed that his urology appt is not until Sept 28th. I called [facility] to see if we could get him an earlier appt and the triage nurse, Zella Ball, stated that he had an appt on 8/5 that he cancelled and they've tried getting him earlier. Per her report, if he calls, they can get him in much sooner. Dicussed hospital admission vs close urology follow up with the patient and he was agreeable to waiting for urology follow up. - Stressed importance of pt following up with urology and not cancelling  - Suggested pt increasing Flomax to 2 capsules (0.8mg ) daily to see if this helps  with stream strength. - Refilled Vicodin today.  - Discussed strict reasons to return to the ED: inability to urinate, fever, chills, pain not controlled by pain medication, or unable to drink/eat due to nausea or vomiting. Patient voiced understanding.     No orders of the defined types were placed in this encounter.    Meds ordered this encounter  Medications  . oxyCODONE-acetaminophen (PERCOCET) 5-325 MG per tablet    Sig: Take 1 tablet by mouth every 8 (eight) hours as needed for severe pain.    Dispense:  45 tablet    Refill:  0  . tamsulosin (FLOMAX) 0.4 MG CAPS capsule    Sig: Take 2 capsules (0.8 mg total) by mouth daily.    Dispense:  20 capsule    Refill:  0  Archie Patten PGY-2, [place]

## 2015-03-05 DIAGNOSIS — R109 Unspecified abdominal pain: Secondary | ICD-10-CM | POA: Diagnosis not present

## 2015-03-15 ENCOUNTER — Emergency Department (HOSPITAL_COMMUNITY)
Admission: EM | Admit: 2015-03-15 | Discharge: 2015-03-16 | Disposition: A | Discharge status: Home / Self Care | Payer: Medicare Other | Attending: Emergency Medicine | Admitting: Emergency Medicine

## 2015-03-15 ENCOUNTER — Encounter (HOSPITAL_COMMUNITY): Payer: Self-pay | Admitting: Emergency Medicine

## 2015-03-15 DIAGNOSIS — F17210 Nicotine dependence, cigarettes, uncomplicated: Secondary | ICD-10-CM | POA: Diagnosis not present

## 2015-03-15 DIAGNOSIS — Z955 Presence of coronary angioplasty implant and graft: Secondary | ICD-10-CM | POA: Diagnosis not present

## 2015-03-15 DIAGNOSIS — I10 Essential (primary) hypertension: Secondary | ICD-10-CM | POA: Diagnosis not present

## 2015-03-15 DIAGNOSIS — Z951 Presence of aortocoronary bypass graft: Secondary | ICD-10-CM | POA: Insufficient documentation

## 2015-03-15 DIAGNOSIS — Z79899 Other long term (current) drug therapy: Secondary | ICD-10-CM | POA: Diagnosis not present

## 2015-03-15 DIAGNOSIS — N132 Hydronephrosis with renal and ureteral calculous obstruction: Secondary | ICD-10-CM | POA: Diagnosis not present

## 2015-03-15 DIAGNOSIS — N200 Calculus of kidney: Secondary | ICD-10-CM | POA: Diagnosis present

## 2015-03-15 DIAGNOSIS — I252 Old myocardial infarction: Secondary | ICD-10-CM | POA: Diagnosis not present

## 2015-03-15 DIAGNOSIS — E669 Obesity, unspecified: Secondary | ICD-10-CM | POA: Diagnosis not present

## 2015-03-15 DIAGNOSIS — R112 Nausea with vomiting, unspecified: Secondary | ICD-10-CM | POA: Diagnosis not present

## 2015-03-15 DIAGNOSIS — Z7902 Long term (current) use of antithrombotics/antiplatelets: Secondary | ICD-10-CM | POA: Diagnosis not present

## 2015-03-15 DIAGNOSIS — N1330 Unspecified hydronephrosis: Secondary | ICD-10-CM | POA: Diagnosis not present

## 2015-03-15 DIAGNOSIS — I2510 Atherosclerotic heart disease of native coronary artery without angina pectoris: Secondary | ICD-10-CM | POA: Diagnosis not present

## 2015-03-15 DIAGNOSIS — F419 Anxiety disorder, unspecified: Secondary | ICD-10-CM | POA: Diagnosis not present

## 2015-03-15 DIAGNOSIS — E785 Hyperlipidemia, unspecified: Secondary | ICD-10-CM | POA: Insufficient documentation

## 2015-03-15 DIAGNOSIS — N202 Calculus of kidney with calculus of ureter: Secondary | ICD-10-CM | POA: Diagnosis not present

## 2015-03-15 DIAGNOSIS — R109 Unspecified abdominal pain: Secondary | ICD-10-CM | POA: Diagnosis not present

## 2015-03-15 DIAGNOSIS — N201 Calculus of ureter: Secondary | ICD-10-CM | POA: Diagnosis not present

## 2015-03-15 NOTE — ED Notes (Signed)
Pt from home c/o urinary retention. Pt reports that he hasn't urinated all day. Hx of kidney stones. Recently seen urology.

## 2015-03-16 ENCOUNTER — Emergency Department (HOSPITAL_COMMUNITY): Payer: Medicare Other

## 2015-03-16 ENCOUNTER — Emergency Department (HOSPITAL_COMMUNITY): Payer: Medicare Other | Admitting: Anesthesiology

## 2015-03-16 ENCOUNTER — Encounter (HOSPITAL_COMMUNITY)
Admission: EM | Disposition: A | Discharge status: Home / Self Care | Payer: Self-pay | Source: Home / Self Care | Attending: Emergency Medicine

## 2015-03-16 ENCOUNTER — Encounter (HOSPITAL_COMMUNITY): Payer: Self-pay | Admitting: Anesthesiology

## 2015-03-16 DIAGNOSIS — N202 Calculus of kidney with calculus of ureter: Secondary | ICD-10-CM | POA: Diagnosis not present

## 2015-03-16 DIAGNOSIS — N201 Calculus of ureter: Secondary | ICD-10-CM | POA: Diagnosis not present

## 2015-03-16 DIAGNOSIS — R109 Unspecified abdominal pain: Secondary | ICD-10-CM | POA: Diagnosis not present

## 2015-03-16 DIAGNOSIS — E785 Hyperlipidemia, unspecified: Secondary | ICD-10-CM | POA: Diagnosis not present

## 2015-03-16 DIAGNOSIS — N19 Unspecified kidney failure: Secondary | ICD-10-CM | POA: Diagnosis not present

## 2015-03-16 DIAGNOSIS — R112 Nausea with vomiting, unspecified: Secondary | ICD-10-CM | POA: Diagnosis not present

## 2015-03-16 DIAGNOSIS — I252 Old myocardial infarction: Secondary | ICD-10-CM | POA: Diagnosis not present

## 2015-03-16 DIAGNOSIS — I10 Essential (primary) hypertension: Secondary | ICD-10-CM | POA: Diagnosis not present

## 2015-03-16 DIAGNOSIS — N1330 Unspecified hydronephrosis: Secondary | ICD-10-CM | POA: Diagnosis not present

## 2015-03-16 DIAGNOSIS — E669 Obesity, unspecified: Secondary | ICD-10-CM | POA: Diagnosis not present

## 2015-03-16 DIAGNOSIS — N132 Hydronephrosis with renal and ureteral calculous obstruction: Secondary | ICD-10-CM | POA: Diagnosis not present

## 2015-03-16 HISTORY — PX: HOLMIUM LASER APPLICATION: SHX5852

## 2015-03-16 HISTORY — PX: CYSTOSCOPY WITH RETROGRADE PYELOGRAM, URETEROSCOPY AND STENT PLACEMENT: SHX5789

## 2015-03-16 LAB — URINALYSIS, ROUTINE W REFLEX MICROSCOPIC
GLUCOSE, UA: NEGATIVE mg/dL
Ketones, ur: NEGATIVE mg/dL
NITRITE: NEGATIVE
PH: 5.5 (ref 5.0–8.0)
Protein, ur: NEGATIVE mg/dL
SPECIFIC GRAVITY, URINE: 1.026 (ref 1.005–1.030)
Urobilinogen, UA: 0.2 mg/dL (ref 0.0–1.0)

## 2015-03-16 LAB — BASIC METABOLIC PANEL
Anion gap: 8 (ref 5–15)
BUN: 20 mg/dL (ref 6–20)
CALCIUM: 9.1 mg/dL (ref 8.9–10.3)
CO2: 19 mmol/L — ABNORMAL LOW (ref 22–32)
CREATININE: 1.54 mg/dL — AB (ref 0.61–1.24)
Chloride: 112 mmol/L — ABNORMAL HIGH (ref 101–111)
GFR calc Af Amer: >60 mL/min (ref >60)
GFR calc non Af Amer: 56 mL/min — ABNORMAL LOW (ref >60)
Glucose, Bld: 153 mg/dL — ABNORMAL HIGH (ref 65–99)
Potassium: 4.3 mmol/L (ref 3.5–5.1)
SODIUM: 139 mmol/L (ref 135–145)

## 2015-03-16 LAB — CBC WITH DIFFERENTIAL/PLATELET
BASOS ABS: 0 10*3/uL (ref 0.0–0.1)
BASOS PCT: 0 % (ref 0–1)
Eosinophils Absolute: 0.1 10*3/uL (ref 0.0–0.7)
Eosinophils Relative: 1 % (ref 0–5)
HCT: 43.7 % (ref 39.0–52.0)
HEMOGLOBIN: 14.7 g/dL (ref 13.0–17.0)
Lymphocytes Relative: 13 % (ref 12–46)
Lymphs Abs: 1.7 10*3/uL (ref 0.7–4.0)
MCH: 31.6 pg (ref 26.0–34.0)
MCHC: 33.6 g/dL (ref 30.0–36.0)
MCV: 94 fL (ref 78.0–100.0)
Monocytes Absolute: 0.7 10*3/uL (ref 0.1–1.0)
Monocytes Relative: 5 % (ref 3–12)
Neutro Abs: 10.4 10*3/uL — ABNORMAL HIGH (ref 1.7–7.7)
Neutrophils Relative %: 81 % — ABNORMAL HIGH (ref 43–77)
Platelets: 249 10*3/uL (ref 150–400)
RBC: 4.65 MIL/uL (ref 4.22–5.81)
RDW: 13.3 % (ref 11.5–15.5)
WBC: 12.9 10*3/uL — ABNORMAL HIGH (ref 4.0–10.5)

## 2015-03-16 LAB — URINE MICROSCOPIC-ADD ON

## 2015-03-16 SURGERY — CYSTOURETEROSCOPY, WITH RETROGRADE PYELOGRAM AND STENT INSERTION
Anesthesia: General | Site: Ureter | Laterality: Right

## 2015-03-16 MED ORDER — SODIUM CHLORIDE 0.9 % IV BOLUS (SEPSIS)
1000.0000 mL | Freq: Once | INTRAVENOUS | Status: AC
  Administered 2015-03-16: 1000 mL via INTRAVENOUS

## 2015-03-16 MED ORDER — LACTATED RINGERS IV SOLN: INTRAVENOUS | Status: DC

## 2015-03-16 MED ORDER — PROMETHAZINE HCL 25 MG/ML IJ SOLN: 6.2500 mg | INTRAMUSCULAR | Status: DC | PRN

## 2015-03-16 MED ORDER — CEFAZOLIN SODIUM-DEXTROSE 2-3 GM-% IV SOLR
INTRAVENOUS | Status: AC
  Filled 2015-03-16: qty 50

## 2015-03-16 MED ORDER — HYDROMORPHONE HCL 1 MG/ML IJ SOLN: 0.2500 mg | INTRAMUSCULAR | Status: DC | PRN

## 2015-03-16 MED ORDER — BELLADONNA ALKALOIDS-OPIUM 16.2-60 MG RE SUPP
RECTAL | Status: DC | PRN
  Administered 2015-03-16: 1 via RECTAL

## 2015-03-16 MED ORDER — TAMSULOSIN HCL 0.4 MG PO CAPS
0.8000 mg | ORAL_CAPSULE | Freq: Every day | ORAL | Status: DC
Start: 2015-03-16 — End: 2015-04-11

## 2015-03-16 MED ORDER — ONDANSETRON HCL 4 MG/2ML IJ SOLN
INTRAMUSCULAR | Status: DC | PRN
  Administered 2015-03-16: 4 mg via INTRAVENOUS

## 2015-03-16 MED ORDER — ONDANSETRON HCL 4 MG/2ML IJ SOLN
4.0000 mg | Freq: Once | INTRAMUSCULAR | Status: AC
  Administered 2015-03-16: 4 mg via INTRAVENOUS
  Filled 2015-03-16: qty 2

## 2015-03-16 MED ORDER — BELLADONNA ALKALOIDS-OPIUM 16.2-60 MG RE SUPP: 1.0000 | Freq: Once | RECTAL | Status: DC

## 2015-03-16 MED ORDER — BELLADONNA ALKALOIDS-OPIUM 16.2-60 MG RE SUPP
RECTAL | Status: AC
  Filled 2015-03-16: qty 1

## 2015-03-16 MED ORDER — HYDROMORPHONE HCL 1 MG/ML IJ SOLN
1.0000 mg | Freq: Once | INTRAMUSCULAR | Status: AC
  Administered 2015-03-16: 1 mg via INTRAVENOUS
  Filled 2015-03-16: qty 1

## 2015-03-16 MED ORDER — ONDANSETRON 4 MG PO TBDP: 4.0000 mg | ORAL_TABLET | Freq: Three times a day (TID) | ORAL | Status: DC | PRN

## 2015-03-16 MED ORDER — LIDOCAINE HCL (CARDIAC) 20 MG/ML IV SOLN
INTRAVENOUS | Status: DC | PRN
  Administered 2015-03-16: 50 mg via INTRAVENOUS

## 2015-03-16 MED ORDER — DEXTROSE 5 % IV SOLN
3.0000 g | Freq: Once | INTRAVENOUS | Status: AC
  Administered 2015-03-16: 3 g via INTRAVENOUS
  Filled 2015-03-16: qty 3000

## 2015-03-16 MED ORDER — PHENAZOPYRIDINE HCL 200 MG PO TABS: 200.0000 mg | ORAL_TABLET | Freq: Three times a day (TID) | ORAL | Status: DC | PRN

## 2015-03-16 MED ORDER — FENTANYL CITRATE (PF) 100 MCG/2ML IJ SOLN
INTRAMUSCULAR | Status: DC | PRN
  Administered 2015-03-16: 50 ug via INTRAVENOUS
  Administered 2015-03-16: 100 ug via INTRAVENOUS
  Administered 2015-03-16 (×2): 50 ug via INTRAVENOUS

## 2015-03-16 MED ORDER — KETOROLAC TROMETHAMINE 30 MG/ML IJ SOLN
INTRAMUSCULAR | Status: AC
  Filled 2015-03-16: qty 1

## 2015-03-16 MED ORDER — MORPHINE SULFATE (PF) 4 MG/ML IV SOLN
4.0000 mg | Freq: Once | INTRAVENOUS | Status: AC
  Administered 2015-03-16: 4 mg via INTRAVENOUS
  Filled 2015-03-16: qty 1

## 2015-03-16 MED ORDER — HYDROCODONE-ACETAMINOPHEN 5-325 MG PO TABS
2.0000 | ORAL_TABLET | Freq: Once | ORAL | Status: AC
Start: 2015-03-16 — End: 2015-03-16
  Administered 2015-03-16: 2 via ORAL
  Filled 2015-03-16: qty 2

## 2015-03-16 MED ORDER — OXYCODONE-ACETAMINOPHEN 5-325 MG PO TABS: 1.0000 | ORAL_TABLET | Freq: Three times a day (TID) | ORAL | Status: DC | PRN

## 2015-03-16 MED ORDER — LACTATED RINGERS IV SOLN
INTRAVENOUS | Status: DC | PRN
  Administered 2015-03-16: 08:00:00 via INTRAVENOUS

## 2015-03-16 MED ORDER — LIDOCAINE HCL (CARDIAC) 20 MG/ML IV SOLN
INTRAVENOUS | Status: AC
  Filled 2015-03-16: qty 5

## 2015-03-16 MED ORDER — PROPOFOL 10 MG/ML IV BOLUS
INTRAVENOUS | Status: AC
  Filled 2015-03-16: qty 20

## 2015-03-16 MED ORDER — OXYBUTYNIN CHLORIDE ER 10 MG PO TB24: 10.0000 mg | ORAL_TABLET | Freq: Every day | ORAL | Status: DC

## 2015-03-16 MED ORDER — IOHEXOL 300 MG/ML  SOLN
INTRAMUSCULAR | Status: DC | PRN
  Administered 2015-03-16: 20 mL

## 2015-03-16 MED ORDER — MIDAZOLAM HCL 5 MG/5ML IJ SOLN
INTRAMUSCULAR | Status: DC | PRN
  Administered 2015-03-16: 2 mg via INTRAVENOUS

## 2015-03-16 MED ORDER — CEFAZOLIN SODIUM-DEXTROSE 2-3 GM-% IV SOLR: 2.0000 g | Freq: Three times a day (TID) | INTRAVENOUS | Status: DC

## 2015-03-16 MED ORDER — PROPOFOL 10 MG/ML IV BOLUS
INTRAVENOUS | Status: DC | PRN
  Administered 2015-03-16: 200 mg via INTRAVENOUS

## 2015-03-16 MED ORDER — MIDAZOLAM HCL 2 MG/2ML IJ SOLN
INTRAMUSCULAR | Status: AC
  Filled 2015-03-16: qty 4

## 2015-03-16 MED ORDER — FENTANYL CITRATE (PF) 250 MCG/5ML IJ SOLN
INTRAMUSCULAR | Status: AC
Start: 2015-03-16 — End: 2015-03-16
  Filled 2015-03-16: qty 25

## 2015-03-16 MED ORDER — SODIUM CHLORIDE 0.9 % IR SOLN
Status: DC | PRN
  Administered 2015-03-16: 3000 mL
  Administered 2015-03-16: 1000 mL

## 2015-03-16 MED ORDER — KETOROLAC TROMETHAMINE 30 MG/ML IJ SOLN
INTRAMUSCULAR | Status: DC | PRN
  Administered 2015-03-16: 30 mg via INTRAVENOUS

## 2015-03-16 MED ORDER — MEPERIDINE HCL 50 MG/ML IJ SOLN: 6.2500 mg | INTRAMUSCULAR | Status: DC | PRN

## 2015-03-16 MED ORDER — ONDANSETRON HCL 4 MG/2ML IJ SOLN
INTRAMUSCULAR | Status: AC
  Filled 2015-03-16: qty 2

## 2015-03-16 SURGICAL SUPPLY — 20 items
BAG URO CATCHER STRL LF (DRAPE) ×4 IMPLANT
BASKET LASER NITINOL 1.9FR (BASKET) IMPLANT
BSKT STON RTRVL 120 1.9FR (BASKET)
CATH INTERMIT  6FR 70CM (CATHETERS) ×4 IMPLANT
CLOTH BEACON ORANGE TIMEOUT ST (SAFETY) ×4 IMPLANT
EXTRACTOR STONE NITINOL NGAGE (UROLOGICAL SUPPLIES) ×4 IMPLANT
FIBER LASER FLEXIVA 200 (UROLOGICAL SUPPLIES) ×2 IMPLANT
FIBER LASER TRAC TIP (UROLOGICAL SUPPLIES) IMPLANT
GLOVE BIO SURGEON STRL SZ8 (GLOVE) ×4 IMPLANT
GOWN STRL REUS W/TWL XL LVL3 (GOWN DISPOSABLE) ×4 IMPLANT
GUIDEWIRE STR DUAL SENSOR (WIRE) ×4 IMPLANT
IV NS 1000ML (IV SOLUTION) ×4
IV NS 1000ML BAXH (IV SOLUTION) ×2 IMPLANT
MANIFOLD NEPTUNE II (INSTRUMENTS) ×4 IMPLANT
PACK CYSTO (CUSTOM PROCEDURE TRAY) ×4 IMPLANT
STENT CONTOUR 6FRX26X.038 (STENTS) ×6 IMPLANT
SYR CONTROL 10ML LL (SYRINGE) ×4 IMPLANT
TUBE FEEDING 8FR 16IN STR KANG (MISCELLANEOUS) ×4 IMPLANT
TUBING CONNECTING 10 (TUBING) ×3 IMPLANT
TUBING CONNECTING 10' (TUBING) ×1

## 2015-03-16 NOTE — Anesthesia Preprocedure Evaluation (Addendum)
Anesthesia Evaluation  Patient identified by MRN, date of birth, ID band Patient awake    Reviewed: Allergy & Precautions, NPO status , Patient's Chart, lab work & pertinent test results  Airway Mallampati: III  TM Distance: >3 FB Neck ROM: Full    Dental  (+) Poor Dentition   Pulmonary Current Smoker,  breath sounds clear to auscultation  Pulmonary exam normal       Cardiovascular hypertension, Pt. on medications and Pt. on home beta blockers + angina with exertion + CAD and + Past MI Normal cardiovascular examRhythm:Regular Rate:Normal  S/P IWMI and Non STEMI S/P 4v CABG '09 S/P PCI LCx and RCA Good exercise tolerance   Neuro/Psych PSYCHIATRIC DISORDERS Anxiety  Neuromuscular disease    GI/Hepatic negative GI ROS, Neg liver ROS,   Endo/Other  Hyperlipidemia Obesity  Renal/GU Renal InsufficiencyRenal diseaseBilateral ureteral calculi  negative genitourinary   Musculoskeletal negative musculoskeletal ROS (+)   Abdominal (+) + obese,   Peds  Hematology On Plavix- last dose yesterday   Anesthesia Other Findings   Reproductive/Obstetrics                            Anesthesia Physical Anesthesia Plan  ASA: III and emergent  Anesthesia Plan: General   Post-op Pain Management:    Induction: Intravenous  Airway Management Planned: LMA  Additional Equipment:   Intra-op Plan:   Post-operative Plan: Extubation in OR  Informed Consent: I have reviewed the patients History and Physical, chart, labs and discussed the procedure including the risks, benefits and alternatives for the proposed anesthesia with the patient or authorized representative who has indicated his/her understanding and acceptance.   Dental advisory given  Plan Discussed with: CRNA, Anesthesiologist and Surgeon  Anesthesia Plan Comments:         Anesthesia Quick Evaluation

## 2015-03-16 NOTE — ED Notes (Signed)
Pt did not tolerate bladder scan well d/t pain--- highest scan reading was 50 ml.

## 2015-03-16 NOTE — Anesthesia Procedure Notes (Signed)
Procedure Name: LMA Insertion Date/Time: 03/16/2015 8:06 AM Performed by: Enriqueta Shutter D Pre-anesthesia Checklist: Patient identified, Emergency Drugs available, Suction available, Patient being monitored and Timeout performed Patient Re-evaluated:Patient Re-evaluated prior to inductionOxygen Delivery Method: Circle system utilized Preoxygenation: Pre-oxygenation with 100% oxygen Intubation Type: IV induction LMA Size: 4.0 Number of attempts: 1 Dental Injury: Teeth and Oropharynx as per pre-operative assessment

## 2015-03-16 NOTE — ED Notes (Signed)
Pt able to urinate approximately 50 ml or cloudy urine at this time.  Pt asked if inserting the foley cath was necessary--- was advised that it was not, bladder scan did not show retention and CT scan showed :decompressed bladder.

## 2015-03-16 NOTE — Consult Note (Signed)
Urology Consult  Referring physician: Dr. Otter Reason for referral: bilateral ureteral calculi  Chief Complaint: right flank pain  History of Present Illness: [name] is a 38yo with a hx of CAD s/p CABG who presented to the ER this morning with severe, sharp, intermittent,  Right flank pain radiating to his right testicle. He denies fevers/chills/sweats. He has associated nausea and vomiting. He complains of severe LUTS including dysuria frequency and urgency. He has urinated 150cc in 24 hours. WBC count is 12.9. Ct scan shows a right 6mm UVJ stone with moderate hydronephrosis and a left 3mm UPJ stone with no hydronephrosis.  This is his first stone event. His father has a hx of nephrolithiasis.  Past Medical History  Diagnosis Date  . Hypertension   . MI (myocardial infarction)   . Hx of CABG   . Heart attack   . Bronchitis   . Tobacco user   . Hyperlipidemia   . MYOCARDIAL INFARCTION 10/13/2008    Qualifier: Diagnosis of  By: Brodie, MD, FACC, Bruce Rogers    Past Surgical History  Procedure Laterality Date  . Coronary artery bypass graft      status post diaphragmatic wall infarction Rx BMS RCA 03-11-08   . Orthopedic surgery    . Coronary stent placement    . Laparoscopic appendectomy N/A 09/17/2013    Procedure: APPENDECTOMY LAPAROSCOPIC;  Surgeon: Douglas A Blackman, MD;  Location: [facility] OR;  Service: General;  Laterality: N/A;  . Appendectomy    . Left heart catheterization with coronary/graft angiogram N/A 10/19/2011    Procedure: LEFT HEART CATHETERIZATION WITH CORONARY/GRAFT ANGIOGRAM;  Surgeon: Thomas D Stuckey, MD;  Location: [facility] CATH LAB;  Service: Cardiovascular;  Laterality: N/A;    Medications: I have reviewed the patient's current medications. Allergies:  Allergies  Allergen Reactions  . Codeine Itching  . Celexa [Citalopram] Itching and Rash    Family History  Problem Relation Age of Onset  . Cancer Mother   . Cancer Sister   . Diabetes Mother   . Diabetes  Sister   . Hyperlipidemia Mother   . Hypertension Mother   . Hyperlipidemia Sister   . Hypertension Sister   . Early death Mother   . Early death Sister    Social History:  reports that he has been smoking Cigarettes.  He has a 10 pack-year smoking history. His smokeless tobacco use includes Snuff. He reports that he does not drink alcohol or use illicit drugs.  Review of Systems  Gastrointestinal: Positive for nausea, vomiting and abdominal pain.  Genitourinary: Positive for dysuria, urgency, frequency, hematuria and flank pain.  All other systems reviewed and are negative.   Physical Exam:  Vital signs in last 24 hours: Temp:  [97.8 F (36.6 C)] 97.8 F (36.6 C) (08/26 2348) Pulse Rate:  [75-100] 75 (08/27 0326) Resp:  [18-22] 18 (08/27 0326) BP: (142-178)/(78-90) 178/78 mmHg (08/27 0326) SpO2:  [93 %-99 %] 93 % (08/27 0326) Physical Exam  Constitutional: He is oriented to person, place, and time. He appears well-developed and well-nourished.  HENT:  Head: Normocephalic and atraumatic.  Eyes: EOM are normal. Pupils are equal, round, and reactive to light.  Neck: No thyromegaly present.  Cardiovascular: Normal rate and regular rhythm.   Respiratory: Effort normal and breath sounds normal.  GI: Soft. He exhibits no distension.  Genitourinary: No penile tenderness.  Musculoskeletal: Normal range of motion.  Neurological: He is alert and oriented to person, place, and time.  Skin: Skin is warm and   dry.  Psychiatric: He has a normal mood and affect. His behavior is normal. Judgment and thought content normal.    Laboratory Data:  Results for orders placed or performed during the hospital encounter of 03/15/15 (from the past 72 hour(s))  Urinalysis, Routine w reflex microscopic-may I&O cath if menses (not at [facility])     Status: Abnormal   Collection Time: 03/16/15  3:34 AM  Result Value Ref Range   Color, Urine AMBER (A) YELLOW    Comment: BIOCHEMICALS MAY BE AFFECTED BY  COLOR   APPearance CLOUDY (A) CLEAR   Specific Gravity, Urine 1.026 1.005 - 1.030   pH 5.5 5.0 - 8.0   Glucose, UA NEGATIVE NEGATIVE mg/dL   Hgb urine dipstick LARGE (A) NEGATIVE   Bilirubin Urine SMALL (A) NEGATIVE   Ketones, ur NEGATIVE NEGATIVE mg/dL   Protein, ur NEGATIVE NEGATIVE mg/dL   Urobilinogen, UA 0.2 0.0 - 1.0 mg/dL   Nitrite NEGATIVE NEGATIVE   Leukocytes, UA SMALL (A) NEGATIVE  Urine microscopic-add on     Status: Abnormal   Collection Time: 03/16/15  3:34 AM  Result Value Ref Range   WBC, UA 0-2 <3 WBC/hpf   RBC / HPF TOO NUMEROUS TO COUNT <3 RBC/hpf   Bacteria, UA MANY (A) RARE   Urine-Other MUCOUS PRESENT   CBC with Differential/Platelet     Status: Abnormal   Collection Time: 03/16/15  6:21 AM  Result Value Ref Range   WBC 12.9 (H) 4.0 - 10.5 K/uL   RBC 4.65 4.22 - 5.81 MIL/uL   Hemoglobin 14.7 13.0 - 17.0 g/dL   HCT 43.7 39.0 - 52.0 %   MCV 94.0 78.0 - 100.0 fL   MCH 31.6 26.0 - 34.0 pg   MCHC 33.6 30.0 - 36.0 g/dL   RDW 13.3 11.5 - 15.5 %   Platelets 249 150 - 400 K/uL   Neutrophils Relative % 81 (H) 43 - 77 %   Neutro Abs 10.4 (H) 1.7 - 7.7 K/uL   Lymphocytes Relative 13 12 - 46 %   Lymphs Abs 1.7 0.7 - 4.0 K/uL   Monocytes Relative 5 3 - 12 %   Monocytes Absolute 0.7 0.1 - 1.0 K/uL   Eosinophils Relative 1 0 - 5 %   Eosinophils Absolute 0.1 0.0 - 0.7 K/uL   Basophils Relative 0 0 - 1 %   Basophils Absolute 0.0 0.0 - 0.1 K/uL   No results found for this or any previous visit (from the past 240 hour(s)). Creatinine: No results for input(s): CREATININE in the last 168 hours.   Impression/Assessment:  38 yo with bilateral ureteral calculi and concern for renal failure  Plan:  1. Risks/benefits/alternatives to cystoscopy, bilateral retrogrades, bilateral stent placement, and possible bilateral stone extraction was explained to the patient and he understands and wishes to proceed with surgery. Will take urgently to OR. 2. Creatinine  pending  Dyrell Tuccillo L 03/16/2015, 7:01 AM      

## 2015-03-16 NOTE — ED Provider Notes (Signed)
CSN: [id number]     Arrival date & time 03/15/15  2335 History   This chart was scribed for Marisa Severin, MD by Octavia Heir, ED Scribe. This patient was seen in room WA06/WA06 and the patient's care was started at 1:01 AM.    Chief Complaint  Patient presents with  . Urinary Retention      The history is provided by the patient. No language interpreter was used.   HPI Comments: JAYLEN KNOPE is a 39 y.o. male who has a hx of HTN, MI, and hyperlipidemia presents to the Emergency Department complaining of intermittent, gradual worsening urinary rentention onset 8 months ago. He notes having associated right abdominal pain, dysuria and hematuria. Pt notes the pain starts in his RLQ and radiates down into his testicular area. He reports he has not urinated all day and the last time he urinated was last night. Pt notes seeing urologist about one week ago and was told he has a stone close to a bladder. He has been drinking water to help with the passage of the kidney stone. He has an appointment to see urology again on Sept. 6th to follow up with his symptoms. He has not taken any mediation to alleviate the pain. Pt denies problems with his prostate.  Past Medical History  Diagnosis Date  . Hypertension   . MI (myocardial infarction)   . Hx of CABG   . Heart attack   . Bronchitis   . Tobacco user   . Hyperlipidemia   . MYOCARDIAL INFARCTION 10/13/2008    Qualifier: Diagnosis of  By: Juanda Chance, MD, Johny Chess    Past Surgical History  Procedure Laterality Date  . Coronary artery bypass graft      status post diaphragmatic wall infarction Rx BMS RCA 03-11-08   . Orthopedic surgery    . Coronary stent placement    . Laparoscopic appendectomy N/A 09/17/2013    Procedure: APPENDECTOMY LAPAROSCOPIC;  Surgeon: Shelly Rubenstein, MD;  Location: [facility] OR;  Service: General;  Laterality: N/A;  . Appendectomy    . Left heart catheterization with coronary/graft angiogram N/A 10/19/2011     Procedure: LEFT HEART CATHETERIZATION WITH Isabel Caprice;  Surgeon: Herby Abraham, MD;  Location: [facility] CATH LAB;  Service: Cardiovascular;  Laterality: N/A;   Family History  Problem Relation Age of Onset  . Cancer Mother   . Cancer Sister   . Diabetes Mother   . Diabetes Sister   . Hyperlipidemia Mother   . Hypertension Mother   . Hyperlipidemia Sister   . Hypertension Sister   . Early death Mother   . Early death Sister    Social History  Substance Use Topics  . Smoking status: Heavy Tobacco Smoker -- 0.50 packs/day for 20 years    Types: Cigarettes  . Smokeless tobacco: Current User    Types: Snuff     Comment: E-Cigarette  . Alcohol Use: No     Comment: maybe 3-4 drinks a week, occasionally.    Review of Systems  Genitourinary: Positive for dysuria, flank pain, decreased urine volume and difficulty urinating.  All other systems reviewed and are negative.     Allergies  Codeine and Celexa  Home Medications   Prior to Admission medications   Medication Sig Start Date End Date Taking? Authorizing Provider  atorvastatin (LIPITOR) 80 MG tablet TAKE ONE TABLET BY MOUTH ONCE DAILY AT  6  PM 10/01/14  Yes Stephanie Coup Street, MD  baclofen (LIORESAL) 10  MG tablet Take 1 tablet (10 mg total) by mouth 3 (three) times daily. Patient taking differently: Take 10 mg by mouth 3 (three) times daily as needed for muscle spasms.  02/18/15  Yes Joanna Puff, MD  bismuth subsalicylate (PEPTO BISMOL) 262 MG/15ML suspension Take 30 mLs by mouth every 6 (six) hours as needed for indigestion.   Yes Historical Provider, MD  clonazePAM (KLONOPIN) 1 MG tablet Take 0.5-1 tablets (0.5-1 mg total) by mouth 2 (two) times daily as needed for anxiety. Patient taking differently: Take 1 mg by mouth 2 (two) times daily as needed for anxiety.  02/18/15  Yes Joanna Puff, MD  clopidogrel (PLAVIX) 75 MG tablet TAKE ONE TABLET BY MOUTH IN THE MORNING 10/01/14  Yes Stephanie Coup Street, MD   fenofibrate (TRICOR) 48 MG tablet TAKE ONE TABLET BY MOUTH ONCE DAILY 10/01/14  Yes Stephanie Coup Street, MD  isosorbide mononitrate (IMDUR) 30 MG 24 hr tablet TAKE ONE TABLET BY MOUTH ONCE DAILY 10/01/14  Yes Stephanie Coup Street, MD  losartan (COZAAR) 100 MG tablet Take 1 tablet (100 mg total) by mouth daily. 12/28/14  Yes Stephanie Coup Street, MD  metoprolol tartrate (LOPRESSOR) 25 MG tablet TAKE ONE TABLET BY MOUTH TWICE DAILY 10/01/14  Yes Stephanie Coup Street, MD  Multiple Vitamin (MULITIVITAMIN WITH MINERALS) TABS Take 1 tablet by mouth every morning.    Yes Historical Provider, MD  ondansetron (ZOFRAN ODT) 4 MG disintegrating tablet Take 1 tablet (4 mg total) by mouth every 8 (eight) hours as needed for nausea or vomiting. 02/19/15  Yes Everlene Farrier, PA-C  Probiotic Product (PRO-BIOTIC BLEND) CAPS Take 1 capsule by mouth daily. Helps with digestion   Yes Historical Provider, MD  RANEXA 500 MG 12 hr tablet TAKE ONE TABLET BY MOUTH TWICE DAILY 02/04/15  Yes Joanna Puff, MD  tamsulosin (FLOMAX) 0.4 MG CAPS capsule Take 2 capsules (0.8 mg total) by mouth daily. 03/04/15  Yes Joanna Puff, MD  HYDROcodone-acetaminophen (NORCO/VICODIN) 5-325 MG per tablet Take 1 tablet by mouth every 4 (four) hours as needed for moderate pain or severe pain. Patient not taking: Reported on 03/16/2015 02/19/15   Everlene Farrier, PA-C  nitroGLYCERIN (NITROSTAT) 0.4 MG SL tablet Place 1 tablet (0.4 mg total) under the tongue every 5 (five) minutes as needed for chest pain. 08/08/13   Laurey Morale, MD  oxyCODONE-acetaminophen (PERCOCET) 5-325 MG per tablet Take 1 tablet by mouth every 8 (eight) hours as needed for severe pain. Patient not taking: Reported on 03/16/2015 03/04/15   Joanna Puff, MD   Triage vitals: BP 142/90 mmHg  Pulse 100  Temp(Src) 97.8 F (36.6 C) (Oral)  Resp 22  SpO2 99% Physical Exam  Constitutional: He is oriented to person, place, and time. He appears well-developed and  well-nourished. He appears distressed.  HENT:  Head: Normocephalic and atraumatic.  Nose: Nose normal.  Mouth/Throat: Oropharynx is clear and moist.  Eyes: Conjunctivae and EOM are normal. Pupils are equal, round, and reactive to light.  Neck: Normal range of motion. Neck supple. No JVD present. No tracheal deviation present. No thyromegaly present.  Cardiovascular: Normal rate, regular rhythm, normal heart sounds and intact distal pulses.  Exam reveals no gallop and no friction rub.   No murmur heard. Pulmonary/Chest: Effort normal and breath sounds normal. No stridor. No respiratory distress. He has no wheezes. He has no rales. He exhibits no tenderness.  Abdominal: Soft. Bowel sounds are normal. He exhibits no distension and no mass.  There is tenderness (diffuse tenderness throughout abdomen worse in right side). There is no rebound and no guarding.  CVA tenderness on right  Genitourinary:  Testicles, nontender  Musculoskeletal: Normal range of motion. He exhibits no edema or tenderness.  Lymphadenopathy:    He has no cervical adenopathy.  Neurological: He is alert and oriented to person, place, and time. He displays normal reflexes. He exhibits normal muscle tone. Coordination normal.  Skin: Skin is warm and dry. No rash noted. No erythema. No pallor.  Psychiatric: He has a normal mood and affect. His behavior is normal. Judgment and thought content normal.  Nursing note and vitals reviewed.   ED Course  Procedures  DIAGNOSTIC STUDIES: Oxygen Saturation is 99% on RA, normal by my interpretation.  COORDINATION OF CARE:  1:07 AM Discussed treatment plan which includes lab work, CT scan and pain medication with pt at bedside and pt agreed to plan.  Labs Review Labs Reviewed  URINALYSIS, ROUTINE W REFLEX MICROSCOPIC (NOT AT [facility]) - Abnormal; Notable for the following:    Color, Urine AMBER (*)    APPearance CLOUDY (*)    Hgb urine dipstick LARGE (*)    Bilirubin Urine SMALL (*)     Leukocytes, UA SMALL (*)    All other components within normal limits  URINE MICROSCOPIC-ADD ON - Abnormal; Notable for the following:    Bacteria, UA MANY (*)    All other components within normal limits  BASIC METABOLIC PANEL - Abnormal; Notable for the following:    Chloride 112 (*)    CO2 19 (*)    Glucose, Bld 153 (*)    Creatinine, Ser 1.54 (*)    GFR calc non Af Amer 56 (*)    All other components within normal limits  CBC WITH DIFFERENTIAL/PLATELET - Abnormal; Notable for the following:    WBC 12.9 (*)    Neutrophils Relative % 81 (*)    Neutro Abs 10.4 (*)    All other components within normal limits    Imaging Review Ct Renal Stone Study  03/16/2015   CLINICAL DATA:  Right abdominal pain, right flank pain, dysuria, and hematuria. Pain radiates down to the testicular area. Has not urinated all day.  EXAM: CT ABDOMEN AND PELVIS WITHOUT CONTRAST  TECHNIQUE: Multidetector CT imaging of the abdomen and pelvis was performed following the standard protocol without IV contrast.  COMPARISON:  07/23/2014  FINDINGS: Lung bases are clear.  Small esophageal hiatal hernia.  6.4 mm stone in the distal right ureter just above the ureterovesical junction. There is proximal hydronephrosis and hydroureter with mild stranding around the ureter. Tiny punctate stone in the mid body of the left kidney. 2.6 mm stone in the proximal left ureter just below the ureteropelvic junction. No hydronephrosis or hydroureter. Bladder is decompressed. No bladder stones identified.  Unenhanced appearance of the liver, spleen, gallbladder, pancreas, adrenal glands, abdominal aorta, inferior vena cava, and retroperitoneal lymph nodes is unremarkable. Stomach, small bowel, and colon are not abnormally distended. No free air or free fluid in the abdomen. Abdominal wall musculature appears intact.  Pelvis: Appendix is normal. No free or loculated pelvic fluid collections. Prostate gland is not enlarged. No pelvic mass or  lymphadenopathy. No destructive bone lesions.  IMPRESSION: 6.4 mm stone in the distal right ureter with moderate proximal obstruction. Nonobstructing stones in the midpole left kidney and in the proximal left ureter.   Electronically Signed   By: Burman Nieves M.D.   On: 03/16/2015 02:11  I have personally reviewed and evaluated these images and lab results as part of my medical decision-making.   EKG Interpretation None      MDM   Final diagnoses:  Bilateral kidney stones    I personally performed the services described in this documentation, which was scribed in my presence. The recorded information has been reviewed and is accurate.  39 year old male with right flank pain ongoing since January with intermittent hematuria.  Patient seen earlier this month with ultrasound that showed hydronephrosis.  He reports being seen by urology this past week with an x-ray that showed a stone near his bladder.  Plan for pain control and renal stone CT scan.  Patient complaining of urinary retention, bedside bladder scan with less than 50, no distention over the bladder.  Patient with persistent pain.  Large stone noted in distal ureter, second stone on left ureter.  Will discuss with urology for evaluation  Urology has seen the patient, and is planning to admit for procedure.  Patient updated on findings and plan.   Marisa Severin, MD 03/16/15 [phone]

## 2015-03-16 NOTE — Transfer of Care (Signed)
Immediate Anesthesia Transfer of Care Note  Patient: [name]  Procedure(s) Performed: Procedure(s): CYSTOSCOPY WITH BILATERAL RETROGRADE PYELOGRAM, RIGHT URETEROSCOPY, STONE BASKETRY WITH LASER LITHOTRIPSY ON THE RIGHT,  AND BILATERAL STENT PLACEMENT (Bilateral) HOLMIUM LASER APPLICATION (Right)  Patient Location: PACU  Anesthesia Type:General  Level of Consciousness: awake, alert  and oriented  Airway & Oxygen Therapy: Patient Spontanous Breathing and Patient connected to face mask oxygen  Post-op Assessment: Report given to RN and Post -op Vital signs reviewed and stable  Post vital signs: Reviewed and stable  Last Vitals:  Filed Vitals:   03/16/15 0326  BP: 178/78  Pulse: 75  Temp:   Resp: 18    Complications: No apparent anesthesia complications

## 2015-03-16 NOTE — Discharge Instructions (Signed)

## 2015-03-16 NOTE — Brief Op Note (Signed)
03/15/2015 - 03/16/2015  8:42 AM  PATIENT:  [name]  39 y.o. male  PRE-OPERATIVE DIAGNOSIS:  bilateral ureteral stones, renal failure  POST-OPERATIVE DIAGNOSIS:  bilateral ureteral stones, renal failure  PROCEDURE:  Procedure(s): CYSTOSCOPY WITH BILATERAL RETROGRADE PYELOGRAM, RIGHT URETEROSCOPY, STONE BASKETRY WITH LASER LITHOTRIPSY ON THE RIGHT,  AND BILATERAL STENT PLACEMENT (Bilateral) HOLMIUM LASER APPLICATION (Right)  SURGEON:  Surgeon(s) and Role:    * Malen Gauze, MD - Primary  PHYSICIAN ASSISTANT:   ASSISTANTS: none   ANESTHESIA:   general  EBL:     BLOOD ADMINISTERED:none  DRAINS: bilateral 6x26 JJ ureteral stents  LOCAL MEDICATIONS USED:  NONE  SPECIMEN:  Source of Specimen:  stone  DISPOSITION OF SPECIMEN:  N/A  COUNTS:  YES  TOURNIQUET:  * No tourniquets in log *  DICTATION: .Note written in EPIC  PLAN OF CARE: Discharge to home after PACU  PATIENT DISPOSITION:  PACU - hemodynamically stable.   Delay start of Pharmacological VTE agent (>24hrs) due to surgical blood loss or risk of bleeding: not applicable

## 2015-03-16 NOTE — Op Note (Signed)
Preoperative diagnosis: bilateral ureteral stones  Postoperative diagnosis: Same  Procedure: 1 cystoscopy 2. bilateralretrograde pyelography 3.  Intraoperative fluoroscopy, under one hour, with interpretation 4.  Right ureteroscopic stone manipulation with laser lithotripsy 5.  bilateral 6 x 26 JJ stent placement  Attending: Wilkie Aye  Anesthesia: General  Estimated blood loss: None  Drains: bilateral 6 x 26 JJ ureteral stent without tether  Specimens: right stone for analysis  Antibiotics: ancef  Findings: Right 6mm distal ureteral calculus with severe proximal hydroureteronephrosis. Left proximal ureteral calculus with mild hydronephrosis. No masses/lesions in the bladder.  Indications: Patient is a 38 year old male with a history of bilateral ureteral stones and who who developed anuria  After discussing treatment options, he decided proceed with right ureteroscopic stone manipulation and bilateral stent placement.  Procedure her in detail: The patient was brought to the operating room and a brief timeout was done to ensure correct patient, correct procedure, correct site.  General anesthesia was administered patient was placed in dorsal lithotomy position.  Her genitalia was then prepped and draped in usual sterile fashion.  A rigid 22 French cystoscope was passed in the urethra and the bladder.  Bladder was inspected free masses or lesions.  the right ureteral orifices were in the normal orthotopic locations.  a 6 french ureteral catheter was then instilled into the right ureter orifice.  a gentle retrograde was obtained and findings noted above.  we then placed a zip wire through the ureteral catheter and advanced up to the renal pelvis.  we then removed the cystoscope and cannulated the right ureteral orifice with a semirigid ureteroscope.  we then encountered the stone in the distal ureter.  using using a 200 nm laser fiber and fragmented the stone into smaller pieces.  the  pieces were then removed with a Ngage basket.  once all stone fragments were removed we then placed a 6 x 26 double-j ureteral stent over the original zip wire.  We removed the wire and good coil was noted in the the renal pelvis under fluoroscopy and the bladder under direct vision.  We then turned our attention to the left side. a 6 french ureteral catheter was then instilled into the left ureter orifice.  a gentle retrograde was obtained and findings noted above.  we then placed a zip wire through the ureteral catheter and advanced up to the renal pelvis. we then placed a 6 x 26 double-j ureteral stent over the zip wire.  We removed the wire and good coil was noted in the the renal pelvis under fluoroscopy and the bladder under direct vision.   the stone fragments were then removed from the bladder and sent for analysis.   the bladder was then drained and this concluded the procedure which was well tolerated by patient.  Complications: None  Condition: Stable, extubated, transferred to PACU  Plan: Patient is to be discharged home as to follow-up in 10-14 days for left stone extraction and bilateral stent removal.

## 2015-03-16 NOTE — Anesthesia Postprocedure Evaluation (Signed)
  Anesthesia Post-op Note  Patient: [name]  Procedure(s) Performed: Procedure(s): CYSTOSCOPY WITH BILATERAL RETROGRADE PYELOGRAM, RIGHT URETEROSCOPY, STONE BASKETRY WITH LASER LITHOTRIPSY ON THE RIGHT,  AND BILATERAL STENT PLACEMENT (Bilateral) HOLMIUM LASER APPLICATION (Right)  Patient Location: PACU  Anesthesia Type:General  Level of Consciousness: awake, alert  and oriented  Airway and Oxygen Therapy: Patient Spontanous Breathing  Post-op Pain: mild  Post-op Assessment: Post-op Vital signs reviewed, Patient's Cardiovascular Status Stable, Respiratory Function Stable, Patent Airway, No signs of Nausea or vomiting and Pain level controlled              Post-op Vital Signs: Reviewed and stable  Last Vitals:  Filed Vitals:   03/16/15 0915  BP: 121/81  Pulse: 81  Temp: 36.7 C  Resp: 13    Complications: No apparent anesthesia complications

## 2015-03-16 NOTE — ED Notes (Signed)
Pt refused to have foley cath inserted at this time; pt stated he will "wait until the doctor give me a pain medicine".

## 2015-03-18 ENCOUNTER — Encounter (HOSPITAL_COMMUNITY): Payer: Self-pay | Admitting: Urology

## 2015-03-20 ENCOUNTER — Other Ambulatory Visit: Payer: Self-pay | Admitting: Urology

## 2015-03-27 DIAGNOSIS — N201 Calculus of ureter: Secondary | ICD-10-CM | POA: Diagnosis not present

## 2015-03-29 ENCOUNTER — Other Ambulatory Visit: Payer: Self-pay | Admitting: Family Medicine

## 2015-03-29 ENCOUNTER — Encounter (HOSPITAL_COMMUNITY)
Admission: RE | Admit: 2015-03-29 | Discharge: 2015-03-29 | Disposition: A | Discharge status: Home / Self Care | Payer: Medicare Other | Source: Ambulatory Visit | Attending: Urology | Admitting: Urology

## 2015-03-29 ENCOUNTER — Encounter (HOSPITAL_COMMUNITY): Payer: Self-pay

## 2015-03-29 DIAGNOSIS — Z01818 Encounter for other preprocedural examination: Secondary | ICD-10-CM | POA: Insufficient documentation

## 2015-03-29 DIAGNOSIS — N201 Calculus of ureter: Secondary | ICD-10-CM | POA: Diagnosis not present

## 2015-03-29 HISTORY — DX: Unspecified osteoarthritis, unspecified site: M19.90

## 2015-03-29 HISTORY — DX: Calculus of kidney: N20.0

## 2015-03-29 HISTORY — DX: Anesthesia of skin: R20.0

## 2015-03-29 HISTORY — DX: Anxiety disorder, unspecified: F41.9

## 2015-03-29 HISTORY — DX: Other complications of anesthesia, initial encounter: T88.59XA

## 2015-03-29 HISTORY — DX: Dizziness and giddiness: R42

## 2015-03-29 HISTORY — DX: Adverse effect of unspecified anesthetic, initial encounter: T41.45XA

## 2015-03-29 LAB — CBC
HCT: 45.2 % (ref 39.0–52.0)
HEMOGLOBIN: 15.6 g/dL (ref 13.0–17.0)
MCH: 32 pg (ref 26.0–34.0)
MCHC: 34.5 g/dL (ref 30.0–36.0)
MCV: 92.6 fL (ref 78.0–100.0)
PLATELETS: 299 10*3/uL (ref 150–400)
RBC: 4.88 MIL/uL (ref 4.22–5.81)
RDW: 13.1 % (ref 11.5–15.5)
WBC: 8.9 10*3/uL (ref 4.0–10.5)

## 2015-03-29 LAB — COMPREHENSIVE METABOLIC PANEL
ALBUMIN: 4.7 g/dL (ref 3.5–5.0)
ALT: 21 U/L (ref 17–63)
ANION GAP: 10 (ref 5–15)
AST: 22 U/L (ref 15–41)
Alkaline Phosphatase: 56 U/L (ref 38–126)
BUN: 12 mg/dL (ref 6–20)
CO2: 22 mmol/L (ref 22–32)
Calcium: 10 mg/dL (ref 8.9–10.3)
Chloride: 106 mmol/L (ref 101–111)
Creatinine, Ser: 1.18 mg/dL (ref 0.61–1.24)
GFR calc non Af Amer: >60 mL/min (ref >60)
GLUCOSE: 147 mg/dL — AB (ref 65–99)
POTASSIUM: 4.1 mmol/L (ref 3.5–5.1)
SODIUM: 138 mmol/L (ref 135–145)
Total Bilirubin: 0.6 mg/dL (ref 0.3–1.2)
Total Protein: 7.7 g/dL (ref 6.5–8.1)

## 2015-03-29 NOTE — Patient Instructions (Signed)
THEADORE BLUNCK  03/29/2015   Your procedure is scheduled on: Tuesday April 02, 2015   Report to [facility] Main  Entrance take [place]  elevators to 3rd floor to  Short Stay Center at 5:00 AM.  Call this number if you have problems the morning of surgery [phone]   Remember: ONLY 1 PERSON MAY GO WITH YOU TO SHORT STAY TO GET  READY MORNING OF YOUR SURGERY.  Do not eat food or drink liquids :After Midnight.     Take these medicines the morning of surgery with A SIP OF WATER: Isosorbide; Metoprolol; Ranexa                                You may not have any metal on your body including hair pins and              piercings  Do not wear jewelry, lotions, powders or colognes, deodorant                           Men may shave face and neck.   Do not bring valuables to the hospital. [facility] IS NOT             RESPONSIBLE   FOR VALUABLES.  Contacts, dentures or bridgework may not be worn into surgery.      Patients discharged the day of surgery will not be allowed to drive home.  Name and phone number of your driver:Rebecca Annice Needy (girlfriend) [phone]  _____________________________________________________________________             [facility] - Preparing for Surgery Before surgery, you can play an important role.  Because skin is not sterile, your skin needs to be as free of germs as possible.  You can reduce the number of germs on your skin by washing with CHG (chlorahexidine gluconate) soap before surgery.  CHG is an antiseptic cleaner which kills germs and bonds with the skin to continue killing germs even after washing. Please DO NOT use if you have an allergy to CHG or antibacterial soaps.  If your skin becomes reddened/irritated stop using the CHG and inform your nurse when you arrive at Short Stay. Do not shave (including legs and underarms) for at least 48 hours prior to the first CHG shower.  You may shave your face/neck. Please follow  these instructions carefully:  1.  Shower with CHG Soap the night before surgery and the  morning of Surgery.  2.  If you choose to wash your hair, wash your hair first as usual with your  normal  shampoo.  3.  After you shampoo, rinse your hair and body thoroughly to remove the  shampoo.                           4.  Use CHG as you would any other liquid soap.  You can apply chg directly  to the skin and wash                       Gently with a scrungie or clean washcloth.  5.  Apply the CHG Soap to your body ONLY FROM THE NECK DOWN.   Do not use on face/ open  Wound or open sores. Avoid contact with eyes, ears mouth and genitals (private parts).                       Wash face,  Genitals (private parts) with your normal soap.             6.  Wash thoroughly, paying special attention to the area where your surgery  will be performed.  7.  Thoroughly rinse your body with warm water from the neck down.  8.  DO NOT shower/wash with your normal soap after using and rinsing off  the CHG Soap.                9.  Pat yourself dry with a clean towel.            10.  Wear clean pajamas.            11.  Place clean sheets on your bed the night of your first shower and do not  sleep with pets. Day of Surgery : Do not apply any lotions/deodorants the morning of surgery.  Please wear clean clothes to the hospital/surgery center.  FAILURE TO FOLLOW THESE INSTRUCTIONS MAY RESULT IN THE CANCELLATION OF YOUR SURGERY PATIENT SIGNATURE_________________________________  NURSE SIGNATURE__________________________________  ________________________________________________________________________

## 2015-03-29 NOTE — Progress Notes (Addendum)
CXR epic 07/23/2014 EKG epic 02/19/2015 LOV note cardiology/Dr McAlhany/epic 12/07/2014  Spoke with Dr Hodierne/anesthesia in regards to pts H&P. Anesthesia stated okay for pt to continue plavix day of surgery. Clarified with anesthesia pt stated his cardiologist is aware of numbness in left arm that comes and goes. Anesthesia to see pt day of surgery.

## 2015-04-01 NOTE — Anesthesia Preprocedure Evaluation (Addendum)
Anesthesia Evaluation  Patient identified by MRN, date of birth, ID band Patient awake    Reviewed: Allergy & Precautions, H&P , NPO status , Patient's Chart, lab work & pertinent test results  Airway Mallampati: III  TM Distance: >3 FB Neck ROM: Full    Dental no notable dental hx. (+) Poor Dentition, Dental Advisory Given   Pulmonary neg pulmonary ROS, shortness of breath and with exertion, Current Smoker,    Pulmonary exam normal breath sounds clear to auscultation       Cardiovascular hypertension, Pt. on medications and Pt. on home beta blockers + angina with exertion + CAD, + Past MI and + CABG  Normal cardiovascular exam Rhythm:Regular Rate:Normal  S/P IWMI and Non STEMI S/P 4v CABG '09 S/P PCI LCx and RCA Good exercise tolerance On going daily angina on medical management.  Patient said that his cardiologist said no further intervention was possible.   Neuro/Psych PSYCHIATRIC DISORDERS Anxiety  Neuromuscular disease negative neurological ROS  negative psych ROS   GI/Hepatic negative GI ROS, Neg liver ROS,   Endo/Other  negative endocrine ROSMorbid obesityHyperlipidemia Obesity  Renal/GU negative Renal ROSBilateral ureteral calculi  negative genitourinary   Musculoskeletal negative musculoskeletal ROS (+)   Abdominal (+) + obese,   Peds  Hematology negative hematology ROS (+) On Plavix- last dose yesterday   Anesthesia Other Findings   Reproductive/Obstetrics negative OB ROS                          Anesthesia Physical Anesthesia Plan  ASA: IV  Anesthesia Plan: General   Post-op Pain Management:    Induction: Intravenous  Airway Management Planned: LMA  Additional Equipment:   Intra-op Plan:   Post-operative Plan:   Informed Consent: I have reviewed the patients History and Physical, chart, labs and discussed the procedure including the risks, benefits and alternatives  for the proposed anesthesia with the patient or authorized representative who has indicated his/her understanding and acceptance.   Dental Advisory Given  Plan Discussed with: CRNA and Surgeon  Anesthesia Plan Comments:        Anesthesia Quick Evaluation

## 2015-04-02 ENCOUNTER — Ambulatory Visit (HOSPITAL_COMMUNITY): Payer: Medicare Other | Admitting: Anesthesiology

## 2015-04-02 ENCOUNTER — Encounter (HOSPITAL_COMMUNITY): Payer: Self-pay | Admitting: *Deleted

## 2015-04-02 ENCOUNTER — Ambulatory Visit (HOSPITAL_COMMUNITY)
Admission: RE | Admit: 2015-04-02 | Discharge: 2015-04-02 | Disposition: A | Discharge status: Home / Self Care | Payer: Medicare Other | Source: Ambulatory Visit | Attending: Urology | Admitting: Urology

## 2015-04-02 ENCOUNTER — Encounter (HOSPITAL_COMMUNITY)
Admission: RE | Disposition: A | Discharge status: Home / Self Care | Payer: Self-pay | Source: Ambulatory Visit | Attending: Urology

## 2015-04-02 DIAGNOSIS — G709 Myoneural disorder, unspecified: Secondary | ICD-10-CM | POA: Insufficient documentation

## 2015-04-02 DIAGNOSIS — Z79899 Other long term (current) drug therapy: Secondary | ICD-10-CM | POA: Diagnosis not present

## 2015-04-02 DIAGNOSIS — N201 Calculus of ureter: Secondary | ICD-10-CM | POA: Insufficient documentation

## 2015-04-02 DIAGNOSIS — Z6838 Body mass index (BMI) 38.0-38.9, adult: Secondary | ICD-10-CM | POA: Diagnosis not present

## 2015-04-02 DIAGNOSIS — I2510 Atherosclerotic heart disease of native coronary artery without angina pectoris: Secondary | ICD-10-CM | POA: Insufficient documentation

## 2015-04-02 DIAGNOSIS — Z951 Presence of aortocoronary bypass graft: Secondary | ICD-10-CM | POA: Diagnosis not present

## 2015-04-02 DIAGNOSIS — E785 Hyperlipidemia, unspecified: Secondary | ICD-10-CM | POA: Insufficient documentation

## 2015-04-02 DIAGNOSIS — F17210 Nicotine dependence, cigarettes, uncomplicated: Secondary | ICD-10-CM | POA: Insufficient documentation

## 2015-04-02 DIAGNOSIS — I252 Old myocardial infarction: Secondary | ICD-10-CM | POA: Diagnosis not present

## 2015-04-02 DIAGNOSIS — Z955 Presence of coronary angioplasty implant and graft: Secondary | ICD-10-CM | POA: Diagnosis not present

## 2015-04-02 DIAGNOSIS — Z720 Tobacco use: Secondary | ICD-10-CM | POA: Diagnosis not present

## 2015-04-02 DIAGNOSIS — I10 Essential (primary) hypertension: Secondary | ICD-10-CM | POA: Diagnosis not present

## 2015-04-02 DIAGNOSIS — R109 Unspecified abdominal pain: Secondary | ICD-10-CM | POA: Diagnosis present

## 2015-04-02 HISTORY — PX: CYSTOSCOPY WITH RETROGRADE PYELOGRAM, URETEROSCOPY AND STENT PLACEMENT: SHX5789

## 2015-04-02 SURGERY — CYSTOURETEROSCOPY, WITH RETROGRADE PYELOGRAM AND STENT INSERTION
Anesthesia: General | Laterality: Left

## 2015-04-02 MED ORDER — OXYCODONE-ACETAMINOPHEN 5-325 MG PO TABS
1.0000 | ORAL_TABLET | Freq: Once | ORAL | Status: AC
  Administered 2015-04-02: 1 via ORAL
  Filled 2015-04-02: qty 1

## 2015-04-02 MED ORDER — DEXTROSE 5 % IV SOLN
INTRAVENOUS | Status: AC
  Filled 2015-04-02: qty 2

## 2015-04-02 MED ORDER — DEXAMETHASONE SODIUM PHOSPHATE 10 MG/ML IJ SOLN
INTRAMUSCULAR | Status: DC | PRN
  Administered 2015-04-02: 10 mg via INTRAVENOUS

## 2015-04-02 MED ORDER — BELLADONNA ALKALOIDS-OPIUM 16.2-60 MG RE SUPP
RECTAL | Status: DC | PRN
  Administered 2015-04-02: 1 via RECTAL

## 2015-04-02 MED ORDER — PROPOFOL 10 MG/ML IV BOLUS
INTRAVENOUS | Status: DC | PRN
  Administered 2015-04-02: 160 mg via INTRAVENOUS

## 2015-04-02 MED ORDER — MIDAZOLAM HCL 2 MG/2ML IJ SOLN
INTRAMUSCULAR | Status: AC
  Filled 2015-04-02: qty 4

## 2015-04-02 MED ORDER — EPHEDRINE SULFATE 50 MG/ML IJ SOLN
INTRAMUSCULAR | Status: AC
  Filled 2015-04-02: qty 1

## 2015-04-02 MED ORDER — OXYCODONE-ACETAMINOPHEN 10-325 MG PO TABS: 1.0000 | ORAL_TABLET | ORAL | Status: DC | PRN

## 2015-04-02 MED ORDER — FENTANYL CITRATE (PF) 100 MCG/2ML IJ SOLN
INTRAMUSCULAR | Status: DC | PRN
  Administered 2015-04-02 (×4): 50 ug via INTRAVENOUS
  Administered 2015-04-02: 25 ug via INTRAVENOUS
  Administered 2015-04-02: 50 ug via INTRAVENOUS
  Administered 2015-04-02: 25 ug via INTRAVENOUS

## 2015-04-02 MED ORDER — DEXTROSE 5 % IV SOLN
2.0000 g | INTRAVENOUS | Status: AC
  Administered 2015-04-02: 2 mg via INTRAVENOUS

## 2015-04-02 MED ORDER — LIDOCAINE HCL (CARDIAC) 20 MG/ML IV SOLN
INTRAVENOUS | Status: AC
  Filled 2015-04-02: qty 5

## 2015-04-02 MED ORDER — LABETALOL HCL 5 MG/ML IV SOLN
INTRAVENOUS | Status: DC | PRN
  Administered 2015-04-02: 2.5 mg via INTRAVENOUS

## 2015-04-02 MED ORDER — PHENYLEPHRINE HCL 10 MG/ML IJ SOLN
INTRAMUSCULAR | Status: DC | PRN
  Administered 2015-04-02: 40 ug via INTRAVENOUS
  Administered 2015-04-02 (×3): 20 ug via INTRAVENOUS

## 2015-04-02 MED ORDER — MIDAZOLAM HCL 5 MG/5ML IJ SOLN
INTRAMUSCULAR | Status: DC | PRN
  Administered 2015-04-02 (×2): 1 mg via INTRAVENOUS

## 2015-04-02 MED ORDER — BELLADONNA ALKALOIDS-OPIUM 16.2-60 MG RE SUPP
RECTAL | Status: AC
  Filled 2015-04-02: qty 1

## 2015-04-02 MED ORDER — SODIUM CHLORIDE 0.9 % IJ SOLN
INTRAMUSCULAR | Status: AC
  Filled 2015-04-02: qty 10

## 2015-04-02 MED ORDER — FENTANYL CITRATE (PF) 100 MCG/2ML IJ SOLN
INTRAMUSCULAR | Status: AC
  Filled 2015-04-02: qty 4

## 2015-04-02 MED ORDER — LACTATED RINGERS IV SOLN
INTRAVENOUS | Status: DC | PRN
  Administered 2015-04-02 (×2): via INTRAVENOUS

## 2015-04-02 MED ORDER — FENTANYL CITRATE (PF) 100 MCG/2ML IJ SOLN
25.0000 ug | INTRAMUSCULAR | Status: DC | PRN
  Administered 2015-04-02 (×2): 50 ug via INTRAVENOUS

## 2015-04-02 MED ORDER — OXYCODONE HCL 5 MG PO TABS
5.0000 mg | ORAL_TABLET | Freq: Once | ORAL | Status: AC
  Administered 2015-04-02: 5 mg via ORAL
  Filled 2015-04-02: qty 1

## 2015-04-02 MED ORDER — FENTANYL CITRATE (PF) 100 MCG/2ML IJ SOLN
INTRAMUSCULAR | Status: AC
  Filled 2015-04-02: qty 2

## 2015-04-02 MED ORDER — ONDANSETRON HCL 4 MG/2ML IJ SOLN
INTRAMUSCULAR | Status: AC
  Filled 2015-04-02: qty 2

## 2015-04-02 MED ORDER — LACTATED RINGERS IV SOLN: INTRAVENOUS | Status: DC

## 2015-04-02 MED ORDER — ONDANSETRON HCL 4 MG/2ML IJ SOLN
INTRAMUSCULAR | Status: DC | PRN
  Administered 2015-04-02: 4 mg via INTRAVENOUS

## 2015-04-02 MED ORDER — IOHEXOL 300 MG/ML  SOLN
INTRAMUSCULAR | Status: DC | PRN
  Administered 2015-04-02: 10 mL

## 2015-04-02 MED ORDER — LIDOCAINE HCL (CARDIAC) 20 MG/ML IV SOLN
INTRAVENOUS | Status: DC | PRN
  Administered 2015-04-02: 75 mg via INTRAVENOUS

## 2015-04-02 MED ORDER — PROPOFOL 10 MG/ML IV BOLUS
INTRAVENOUS | Status: AC
  Filled 2015-04-02: qty 20

## 2015-04-02 SURGICAL SUPPLY — 17 items
BAG URO CATCHER STRL LF (DRAPE) ×4 IMPLANT
BASKET LASER NITINOL 1.9FR (BASKET) IMPLANT
BASKET STNLS GEMINI 4WIRE 3FR (BASKET) IMPLANT
BSKT STON RTRVL 120 1.9FR (BASKET)
BSKT STON RTRVL GEM 120X11 3FR (BASKET)
CATH INTERMIT  6FR 70CM (CATHETERS) IMPLANT
EXTRACTOR STONE NITINOL NGAGE (UROLOGICAL SUPPLIES) ×3 IMPLANT
GLOVE BIO SURGEON STRL SZ8 (GLOVE) ×4 IMPLANT
GOWN STRL REUS W/TWL LRG LVL3 (GOWN DISPOSABLE) ×4 IMPLANT
GUIDEWIRE ANG ZIPWIRE 038X150 (WIRE) ×4 IMPLANT
GUIDEWIRE STR DUAL SENSOR (WIRE) ×4 IMPLANT
HOVERMATT HALF SINGLE USE (PATIENT TRANSFER) ×3 IMPLANT
MANIFOLD NEPTUNE II (INSTRUMENTS) ×4 IMPLANT
PACK CYSTO (CUSTOM PROCEDURE TRAY) ×4 IMPLANT
TUBE FEEDING 8FR 16IN STR KANG (MISCELLANEOUS) IMPLANT
TUBING CONNECTING 10 (TUBING) ×3 IMPLANT
TUBING CONNECTING 10' (TUBING) ×1

## 2015-04-02 NOTE — Op Note (Signed)
.  Preoperative diagnosis: Left ureteral stone  Postoperative diagnosis: Same  Procedure: 1 cystoscopy 2. Left retrograde pyelography 3.  Intraoperative fluoroscopy, under one hour, with interpretation 4.  Left ureteroscopic stone manipulation with basket extraction 5.  Bilateral stent removal  Attending: Cleda Mccreedy  Anesthesia: General  Estimated blood loss: None  Drains: none  Specimens: stone for analysis  Antibiotics: rocephin  Findings: left proximal ureteral stone. No hydronephrosis on left. No masses/lesions in the bladder. Ureteral orifices in normal anatomic location.  Indications: Patient is a 39 year old male with a history of left ureteral stone and who underwent bilateral stent placement for bilateral ureteral stones and right stone extraction.  After discussing treatment options, he decided proceed with left ureteroscopic stone manipulation.  Procedure her in detail: The patient was brought to the operating room and a brief timeout was done to ensure correct patient, correct procedure, correct site.  General anesthesia was administered patient was placed in dorsal lithotomy position.  Her genitalia was then prepped and draped in usual sterile fashion.  A rigid 22 French cystoscope was passed in the urethra and the bladder.  Bladder was inspected free masses or lesions.  the ureteral orifices were in the normal orthotopic locations. Using a grasper the right stent was removed. Using the grasper the left stent was brought to the urethral meatus. A zip wire was then advanced through the stent and up to the renal pelvis. a 6 french ureteral catheter was then instilled into the left ureteral orifice.  a gentle retrograde was obtained and findings noted above.   we then removed the cystoscope and cannulated the left ureteral orifice with a semirigid ureteroscope.  A 4mm stone was encountered in the proximal ureter. Using an Ngage basket the stone was removed and sent for  analysis. Since this was an uneventful ureteroscopy we elected not to leave a stent. The zip wire was then removed the bladder was then drained and this concluded the procedure which was well tolerated by patient.  Complications: None  Condition: Stable, extubated, transferred to PACU  Plan: Patient is to be discharged home as to follow-up in 1-2 weeks

## 2015-04-02 NOTE — Brief Op Note (Signed)
04/02/2015  8:20 AM  PATIENT:  [name]  39 y.o. male  PRE-OPERATIVE DIAGNOSIS:  LEFT URETERAL STONE  POST-OPERATIVE DIAGNOSIS:  LEFT URETERAL STONE  PROCEDURE:  Procedure(s): CYSTOSCOPY WITH RETROGRADE PYELOGRAM, URETEROSCOPY REMOVAL BILATERAL STENTS (Left)  SURGEON:  Surgeon(s) and Role:    * Malen Gauze, MD - Primary  PHYSICIAN ASSISTANT:   ASSISTANTS: none   ANESTHESIA:   general  EBL:     BLOOD ADMINISTERED:none  DRAINS: none   LOCAL MEDICATIONS USED:  NONE  SPECIMEN:  Source of Specimen:  stone  DISPOSITION OF SPECIMEN:  N/A  COUNTS:  YES  TOURNIQUET:  * No tourniquets in log *  DICTATION: .Note written in EPIC  PLAN OF CARE: Discharge to home after PACU  PATIENT DISPOSITION:  PACU - hemodynamically stable.   Delay start of Pharmacological VTE agent (>24hrs) due to surgical blood loss or risk of bleeding: not applicable

## 2015-04-02 NOTE — Anesthesia Procedure Notes (Signed)
Procedure Name: LMA Insertion Date/Time: 04/02/2015 7:56 AM Performed by: Edison Pace Pre-anesthesia Checklist: Patient identified, Emergency Drugs available, Suction available, Patient being monitored and Timeout performed Patient Re-evaluated:Patient Re-evaluated prior to inductionOxygen Delivery Method: Circle system utilized Preoxygenation: Pre-oxygenation with 100% oxygen Intubation Type: IV induction LMA Size: 4.0 Number of attempts: 1 Tube secured with: Tape Dental Injury: Teeth and Oropharynx as per pre-operative assessment

## 2015-04-02 NOTE — Transfer of Care (Signed)
Immediate Anesthesia Transfer of Care Note  Patient: [name]  Procedure(s) Performed: Procedure(s): CYSTOSCOPY WITH RETROGRADE PYELOGRAM, URETEROSCOPY REMOVAL BILATERAL STENTS (Left)  Patient Location: PACU  Anesthesia Type:General  Level of Consciousness: awake, alert  and oriented  Airway & Oxygen Therapy: Patient Spontanous Breathing and Patient connected to face mask oxygen  Post-op Assessment: Report given to RN, Post -op Vital signs reviewed and stable and Patient moving all extremities  Post vital signs: Reviewed and stable  Last Vitals:  Filed Vitals:   04/02/15 0537  BP: 129/84  Pulse: 80  Temp: 36.4 C  Resp: 18    Complications: No apparent anesthesia complications

## 2015-04-02 NOTE — Interval H&P Note (Signed)
History and Physical Interval Note:  04/02/2015 6:57 AM  [name]  has presented today for surgery, with the diagnosis of LEFT URETERAL STONE  The various methods of treatment have been discussed with the patient and family. After consideration of risks, benefits and other options for treatment, the patient has consented to  Procedure(s): CYSTOSCOPY WITH RETROGRADE PYELOGRAM, URETEROSCOPY AND STENT EXCHANGE (Left) HOLMIUM LASER APPLICATION (Left) CYSTOSCOPY WITH STENT REMOVAL (Right) as a surgical intervention .  The patient's history has been reviewed, patient examined, no change in status, stable for surgery.  I have reviewed the patient's chart and labs.  Questions were answered to the patient's satisfaction.     [name]

## 2015-04-02 NOTE — Anesthesia Postprocedure Evaluation (Signed)
  Anesthesia Post-op Note  Patient: [name]  Procedure(s) Performed: Procedure(s) (LRB): CYSTOSCOPY WITH RETROGRADE PYELOGRAM, URETEROSCOPY REMOVAL BILATERAL STENTS (Left)  Patient Location: PACU  Anesthesia Type: General  Level of Consciousness: awake and alert   Airway and Oxygen Therapy: Patient Spontanous Breathing  Post-op Pain: mild  Post-op Assessment: Post-op Vital signs reviewed, Patient's Cardiovascular Status Stable, Respiratory Function Stable, Patent Airway and No signs of Nausea or vomiting  Last Vitals:  Filed Vitals:   04/02/15 1047  BP: 136/83  Pulse: 70  Temp: 36.6 C  Resp: 18    Post-op Vital Signs: stable   Complications: No apparent anesthesia complications

## 2015-04-02 NOTE — H&P (View-Only) (Signed)
Urology Consult  Referring physician: Dr. Norlene Campbell Reason for referral: bilateral ureteral calculi  Chief Complaint: right flank pain  History of Present Illness: Mr [name] is a 39yo with a hx of CAD s/p CABG who presented to the ER this morning with severe, sharp, intermittent,  Right flank pain radiating to his right testicle. He denies fevers/chills/sweats. He has associated nausea and vomiting. He complains of severe LUTS including dysuria frequency and urgency. He has urinated 150cc in 24 hours. WBC count is 12.9. Ct scan shows a right 6mm UVJ stone with moderate hydronephrosis and a left 3mm UPJ stone with no hydronephrosis.  This is his first stone event. His father has a hx of nephrolithiasis.  Past Medical History  Diagnosis Date  . Hypertension   . MI (myocardial infarction)   . Hx of CABG   . Heart attack   . Bronchitis   . Tobacco user   . Hyperlipidemia   . MYOCARDIAL INFARCTION 10/13/2008    Qualifier: Diagnosis of  By: Juanda Chance, MD, Johny Chess    Past Surgical History  Procedure Laterality Date  . Coronary artery bypass graft      status post diaphragmatic wall infarction Rx BMS RCA 03-11-08   . Orthopedic surgery    . Coronary stent placement    . Laparoscopic appendectomy N/A 09/17/2013    Procedure: APPENDECTOMY LAPAROSCOPIC;  Surgeon: Shelly Rubenstein, MD;  Location: [facility] OR;  Service: General;  Laterality: N/A;  . Appendectomy    . Left heart catheterization with coronary/graft angiogram N/A 10/19/2011    Procedure: LEFT HEART CATHETERIZATION WITH Isabel Caprice;  Surgeon: Herby Abraham, MD;  Location[facility] CATH LAB;  Service: Cardiovascular;  Laterality: N/A;    Medications: I have reviewed the patient's current medications. Allergies:  Allergies  Allergen Reactions  . Codeine Itching  . Celexa [Citalopram] Itching and Rash    Family History  Problem Relation Age of Onset  . Cancer Mother   . Cancer Sister   . Diabetes Mother   . Diabetes  Sister   . Hyperlipidemia Mother   . Hypertension Mother   . Hyperlipidemia Sister   . Hypertension Sister   . Early death Mother   . Early death Sister    Social History:  reports that he has been smoking Cigarettes.  He has a 10 pack-year smoking history. His smokeless tobacco use includes Snuff. He reports that he does not drink alcohol or use illicit drugs.  Review of Systems  Gastrointestinal: Positive for nausea, vomiting and abdominal pain.  Genitourinary: Positive for dysuria, urgency, frequency, hematuria and flank pain.  All other systems reviewed and are negative.   Physical Exam:  Vital signs in last 24 hours: Temp:  [97.8 F (36.6 C)] 97.8 F (36.6 C) (08/26 2348) Pulse Rate:  [75-100] 75 (08/27 0326) Resp:  [18-22] 18 (08/27 0326) BP: (142-178)/(78-90) 178/78 mmHg (08/27 0326) SpO2:  [93 %-99 %] 93 % (08/27 0326) Physical Exam  Constitutional: He is oriented to person, place, and time. He appears well-developed and well-nourished.  HENT:  Head: Normocephalic and atraumatic.  Eyes: EOM are normal. Pupils are equal, round, and reactive to light.  Neck: No thyromegaly present.  Cardiovascular: Normal rate and regular rhythm.   Respiratory: Effort normal and breath sounds normal.  GI: Soft. He exhibits no distension.  Genitourinary: No penile tenderness.  Musculoskeletal: Normal range of motion.  Neurological: He is alert and oriented to person, place, and time.  Skin: Skin is warm and  dry.  Psychiatric: He has a normal mood and affect. His behavior is normal. Judgment and thought content normal.    Laboratory Data:  Results for orders placed or performed during the hospital encounter of 03/15/15 (from the past 72 hour(s))  Urinalysis, Routine w reflex microscopic-may I&O cath if menses (not at [facility])     Status: Abnormal   Collection Time: 03/16/15  3:34 AM  Result Value Ref Range   Color, Urine AMBER (A) YELLOW    Comment: BIOCHEMICALS MAY BE AFFECTED BY  COLOR   APPearance CLOUDY (A) CLEAR   Specific Gravity, Urine 1.026 1.005 - 1.030   pH 5.5 5.0 - 8.0   Glucose, UA NEGATIVE NEGATIVE mg/dL   Hgb urine dipstick LARGE (A) NEGATIVE   Bilirubin Urine SMALL (A) NEGATIVE   Ketones, ur NEGATIVE NEGATIVE mg/dL   Protein, ur NEGATIVE NEGATIVE mg/dL   Urobilinogen, UA 0.2 0.0 - 1.0 mg/dL   Nitrite NEGATIVE NEGATIVE   Leukocytes, UA SMALL (A) NEGATIVE  Urine microscopic-add on     Status: Abnormal   Collection Time: 03/16/15  3:34 AM  Result Value Ref Range   WBC, UA 0-2 <3 WBC/hpf   RBC / HPF TOO NUMEROUS TO COUNT <3 RBC/hpf   Bacteria, UA MANY (A) RARE   Urine-Other MUCOUS PRESENT   CBC with Differential/Platelet     Status: Abnormal   Collection Time: 03/16/15  6:21 AM  Result Value Ref Range   WBC 12.9 (H) 4.0 - 10.5 K/uL   RBC 4.65 4.22 - 5.81 MIL/uL   Hemoglobin 14.7 13.0 - 17.0 g/dL   HCT 16.1 09.6 - 04.5 %   MCV 94.0 78.0 - 100.0 fL   MCH 31.6 26.0 - 34.0 pg   MCHC 33.6 30.0 - 36.0 g/dL   RDW 40.9 81.1 - 91.4 %   Platelets 249 150 - 400 K/uL   Neutrophils Relative % 81 (H) 43 - 77 %   Neutro Abs 10.4 (H) 1.7 - 7.7 K/uL   Lymphocytes Relative 13 12 - 46 %   Lymphs Abs 1.7 0.7 - 4.0 K/uL   Monocytes Relative 5 3 - 12 %   Monocytes Absolute 0.7 0.1 - 1.0 K/uL   Eosinophils Relative 1 0 - 5 %   Eosinophils Absolute 0.1 0.0 - 0.7 K/uL   Basophils Relative 0 0 - 1 %   Basophils Absolute 0.0 0.0 - 0.1 K/uL   No results found for this or any previous visit (from the past 240 hour(s)). Creatinine: No results for input(s): CREATININE in the last 168 hours.   Impression/Assessment:  39 yo with bilateral ureteral calculi and concern for renal failure  Plan:  1. Risks/benefits/alternatives to cystoscopy, bilateral retrogrades, bilateral stent placement, and possible bilateral stone extraction was explained to the patient and he understands and wishes to proceed with surgery. Will take urgently to OR. 2. Creatinine  pending  Shakeerah Gradel L 03/16/2015, 7:01 AM

## 2015-04-02 NOTE — Discharge Instructions (Signed)
Kidney Stones °Kidney stones (urolithiasis) are deposits that form inside your kidneys. The intense pain is caused by the stone moving through the urinary tract. When the stone moves, the ureter goes into spasm around the stone. The stone is usually passed in the urine.  °CAUSES  °· A disorder that makes certain neck glands produce too much parathyroid hormone (primary hyperparathyroidism). °· A buildup of uric acid crystals, similar to gout in your joints. °· Narrowing (stricture) of the ureter. °· A kidney obstruction present at birth (congenital obstruction). °· Previous surgery on the kidney or ureters. °· Numerous kidney infections. °SYMPTOMS  °· Feeling sick to your stomach (nauseous). °· Throwing up (vomiting). °· Blood in the urine (hematuria). °· Pain that usually spreads (radiates) to the groin. °· Frequency or urgency of urination. °DIAGNOSIS  °· Taking a history and physical exam. °· Blood or urine tests. °· CT scan. °· Occasionally, an examination of the inside of the urinary bladder (cystoscopy) is performed. °TREATMENT  °· Observation. °· Increasing your fluid intake. °· Extracorporeal shock wave lithotripsy--This is a noninvasive procedure that uses shock waves to break up kidney stones. °· Surgery may be needed if you have severe pain or persistent obstruction. There are various surgical procedures. Most of the procedures are performed with the use of small instruments. Only small incisions are needed to accommodate these instruments, so recovery time is minimized. °The size, location, and chemical composition are all important variables that will determine the proper choice of action for you. Talk to your health care provider to better understand your situation so that you will minimize the risk of injury to yourself and your kidney.  °HOME CARE INSTRUCTIONS  °· Drink enough water and fluids to keep your urine clear or pale yellow. This will help you to pass the stone or stone fragments. °· Strain  all urine through the provided strainer. Keep all particulate matter and stones for your health care provider to see. The stone causing the pain may be as small as a grain of salt. It is very important to use the strainer each and every time you pass your urine. The collection of your stone will allow your health care provider to analyze it and verify that a stone has actually passed. The stone analysis will often identify what you can do to reduce the incidence of recurrences. °· Only take over-the-counter or prescription medicines for pain, discomfort, or fever as directed by your health care provider. °· Make a follow-up appointment with your health care provider as directed. °· Get follow-up X-rays if required. The absence of pain does not always mean that the stone has passed. It may have only stopped moving. If the urine remains completely obstructed, it can cause loss of kidney function or even complete destruction of the kidney. It is your responsibility to make sure X-rays and follow-ups are completed. Ultrasounds of the kidney can show blockages and the status of the kidney. Ultrasounds are not associated with any radiation and can be performed easily in a matter of minutes. °SEEK MEDICAL CARE IF: °· You experience pain that is progressive and unresponsive to any pain medicine you have been prescribed. °SEEK IMMEDIATE MEDICAL CARE IF:  °· Pain cannot be controlled with the prescribed medicine. °· You have a fever or shaking chills. °· The severity or intensity of pain increases over 18 hours and is not relieved by pain medicine. °· You develop a new onset of abdominal pain. °· You feel faint or pass out. °·   You are unable to urinate. °MAKE SURE YOU:  °· Understand these instructions. °· Will watch your condition. °· Will get help right away if you are not doing well or get worse. °Document Released: 07/06/2005 Document Revised: 03/08/2013 Document Reviewed: 12/07/2012 °ExitCare® Patient Information ©2015  [employer]. This information is not intended to replace advice given to you by your health care provider. Make sure you discuss any questions you have with your health care provider. ° ° ° ° ° ° °General Anesthesia, Care After °Refer to this sheet in the next few weeks. These instructions provide you with information on caring for yourself after your procedure. Your health care provider may also give you more specific instructions. Your treatment has been planned according to current medical practices, but problems sometimes occur. Call your health care provider if you have any problems or questions after your procedure. °WHAT TO EXPECT AFTER THE PROCEDURE °After the procedure, it is typical to experience: °· Sleepiness. °· Nausea and vomiting. °HOME CARE INSTRUCTIONS °· For the first 24 hours after general anesthesia: °¨ Have a responsible person with you. °¨ Do not drive a car. If you are alone, do not take public transportation. °¨ Do not drink alcohol. °¨ Do not take medicine that has not been prescribed by your health care provider. °¨ Do not sign important papers or make important decisions. °¨ You may resume a normal diet and activities as directed by your health care provider. °· Change bandages (dressings) as directed. °· If you have questions or problems that seem related to general anesthesia, call the hospital and ask for the anesthetist or anesthesiologist on call. °SEEK MEDICAL CARE IF: °· You have nausea and vomiting that continue the day after anesthesia. °· You develop a rash. °SEEK IMMEDIATE MEDICAL CARE IF:  °· You have difficulty breathing. °· You have chest pain. °· You have any allergic problems. °Document Released: 10/12/2000 Document Revised: 07/11/2013 Document Reviewed: 01/19/2013 °ExitCare® Patient Information ©2015 [employer]. This information is not intended to replace advice given to you by your health care provider. Make sure you discuss any questions you have with your health  care provider. ° °

## 2015-04-03 ENCOUNTER — Other Ambulatory Visit: Payer: Self-pay | Admitting: *Deleted

## 2015-04-03 MED ORDER — ATORVASTATIN CALCIUM 80 MG PO TABS: ORAL_TABLET | ORAL | Status: DC

## 2015-04-03 MED ORDER — ISOSORBIDE MONONITRATE ER 30 MG PO TB24: 30.0000 mg | ORAL_TABLET | Freq: Every day | ORAL | Status: DC

## 2015-04-03 MED ORDER — METOPROLOL TARTRATE 25 MG PO TABS
25.0000 mg | ORAL_TABLET | Freq: Two times a day (BID) | ORAL | Status: DC
Start: 2015-04-03 — End: 2015-10-17

## 2015-04-03 MED ORDER — CLOPIDOGREL BISULFATE 75 MG PO TABS: 75.0000 mg | ORAL_TABLET | Freq: Every morning | ORAL | Status: DC

## 2015-04-03 MED ORDER — FENOFIBRATE 48 MG PO TABS: 48.0000 mg | ORAL_TABLET | Freq: Every day | ORAL | Status: DC

## 2015-04-03 NOTE — Telephone Encounter (Signed)
Medications refilled.  Thanks, Joanna Puff, MD [facility] Resident  04/03/2015, 6:15 PM

## 2015-04-08 DIAGNOSIS — N201 Calculus of ureter: Secondary | ICD-10-CM | POA: Diagnosis not present

## 2015-04-11 ENCOUNTER — Encounter: Payer: Self-pay | Admitting: *Deleted

## 2015-04-22 ENCOUNTER — Ambulatory Visit: Payer: Self-pay | Admitting: Family Medicine

## 2015-05-02 ENCOUNTER — Ambulatory Visit (INDEPENDENT_AMBULATORY_CARE_PROVIDER_SITE_OTHER): Payer: 59 | Admitting: Family Medicine

## 2015-05-02 ENCOUNTER — Encounter: Payer: Self-pay | Admitting: Family Medicine

## 2015-05-02 VITALS — BP 140/78 | HR 78 | Temp 98.0°F | Resp 16 | Ht 70.0 in | Wt 267.0 lb

## 2015-05-02 DIAGNOSIS — F17210 Nicotine dependence, cigarettes, uncomplicated: Secondary | ICD-10-CM | POA: Diagnosis not present

## 2015-05-02 DIAGNOSIS — I2510 Atherosclerotic heart disease of native coronary artery without angina pectoris: Secondary | ICD-10-CM | POA: Diagnosis not present

## 2015-05-02 DIAGNOSIS — Z7689 Persons encountering health services in other specified circumstances: Secondary | ICD-10-CM

## 2015-05-02 DIAGNOSIS — R739 Hyperglycemia, unspecified: Secondary | ICD-10-CM | POA: Diagnosis not present

## 2015-05-02 DIAGNOSIS — Z7189 Other specified counseling: Secondary | ICD-10-CM

## 2015-05-02 DIAGNOSIS — E782 Mixed hyperlipidemia: Secondary | ICD-10-CM | POA: Diagnosis not present

## 2015-05-02 DIAGNOSIS — I10 Essential (primary) hypertension: Secondary | ICD-10-CM

## 2015-05-02 MED ORDER — VARENICLINE TARTRATE 0.5 MG X 11 & 1 MG X 42 PO MISC: ORAL | Status: DC

## 2015-05-02 NOTE — Progress Notes (Signed)
Subjective:    Patient ID: [name], male    DOB: 12/26/1975, 39 y.o.   MRN: [id number]  HPI Patient is here today to reestablish care. He has a very complicated past medical history. Patient underwent CABG in 2009, stenting of his obtuse marginal in 2010. Last catheterization was in 2013 and showed CAD but it was recommended to manage it medically. He is on imdur as well as ranexa and currently his chest pain is well controlled. Given his history of coronary artery disease, patient is on a beta blocker as well as an angiotensin receptor blocker. He is also on a statin medication for mixed dyslipidemia as well as hyperlipidemia. He is overdue for fasting lipid panel. Patient also recently was found to be hyperglycemic while in the hospital. His blood sugars between 140 and 150. He cannot recall if that was fasting. Therefore he needs to be evaluated for possible type 2 diabetes. Unfortunately he continues to smoke half pack of cigarettes per day. He is motivated to quit. He refuses a flu shot Past Medical History  Diagnosis Date  . Hypertension   . MI (myocardial infarction) (HCC)   . Hx of CABG   . Heart attack (HCC)   . Bronchitis   . Tobacco user   . Hyperlipidemia   . MYOCARDIAL INFARCTION 10/13/2008    Qualifier: Diagnosis of  By: Juanda ChanceBrodie, MD, Johny ChessFACC, Bruce Rogers   . Complication of anesthesia     difficulty awakening following appendectomy   . Coronary artery disease   . Numbness and tingling in left arm     comes and goes   . Anginal pain (HCC)     pt does have to use nitroglycerin   . Pneumonia     hx of in infancy   . Shortness of breath dyspnea     with severe weather changes  . Anxiety   . Numbness in right leg     pt relates to sciatica  . Dizziness     with humidity   . Kidney stones   . Arthritis   . CHF (congestive heart failure) [facility](HCC)    Past Surgical History  Procedure Laterality Date  . Coronary artery bypass graft      status post diaphragmatic wall  infarction Rx BMS RCA 03-11-08   . Orthopedic surgery    . Coronary stent placement    . Laparoscopic appendectomy N/A 09/17/2013    Procedure: APPENDECTOMY LAPAROSCOPIC;  Surgeon: Shelly Rubensteinouglas A Blackman, MD;  Location: [facility] OR;  Service: General;  Laterality: N/A;  . Appendectomy    . Left heart catheterization with coronary/graft angiogram N/A 10/19/2011    Procedure: LEFT HEART CATHETERIZATION WITH Isabel CapriceORONARY/GRAFT ANGIOGRAM;  Surgeon: Herby Abrahamhomas D Stuckey, MD;  [facility];  Service: Cardiovascular;  Laterality: N/A;  . Cystoscopy with retrograde pyelogram, ureteroscopy and stent placement Bilateral 03/16/2015    Procedure: CYSTOSCOPY WITH BILATERAL RETROGRADE PYELOGRAM, RIGHT URETEROSCOPY, STONE BASKETRY WITH LASER LITHOTRIPSY ON THE RIGHT,  AND BILATERAL STENT PLACEMENT;  Surgeon: Malen GauzePatrick L McKenzie, MD;  Location: [facility] ORS;  Service: Urology;  Laterality: Bilateral;  . Holmium laser application Right 03/16/2015    Procedure: HOLMIUM LASER APPLICATION;  Surgeon: Malen GauzePatrick L McKenzie, MD;  Location: [facility] ORS;  Service: Urology;  Laterality: Right;  . Cardiac catheterization    . Tonsillectomy    . Cystoscopy with retrograde pyelogram, ureteroscopy and stent placement Left 04/02/2015    Procedure: CYSTOSCOPY WITH RETROGRADE PYELOGRAM, URETEROSCOPY REMOVAL BILATERAL STENTS;  Surgeon: Luisa HartPatrick  Sandria Manly, MD;  Location: [facility] ORS;  Service: Urology;  Laterality: Left;   Current Outpatient Prescriptions on File Prior to Visit  Medication Sig Dispense Refill  . atorvastatin (LIPITOR) 80 MG tablet TAKE ONE TABLET BY MOUTH ONCE DAILY AT  6  PM 30 tablet 5  . clopidogrel (PLAVIX) 75 MG tablet Take 1 tablet (75 mg total) by mouth every morning. 30 tablet 5  . fenofibrate (TRICOR) 48 MG tablet Take 1 tablet (48 mg total) by mouth daily. 30 tablet 5  . isosorbide mononitrate (IMDUR) 30 MG 24 hr tablet Take 1 tablet (30 mg total) by mouth daily. 30 tablet 5  . losartan (COZAAR) 100 MG tablet Take 1 tablet (100 mg total)  by mouth daily. (Patient taking differently: Take 100 mg by mouth every morning. ) 30 tablet 3  . metoprolol tartrate (LOPRESSOR) 25 MG tablet Take 1 tablet (25 mg total) by mouth 2 (two) times daily. 60 tablet 5  . Multiple Vitamin (MULITIVITAMIN WITH MINERALS) TABS Take 1 tablet by mouth every morning.     . nitroGLYCERIN (NITROSTAT) 0.4 MG SL tablet Place 1 tablet (0.4 mg total) under the tongue every 5 (five) minutes as needed for chest pain. 25 tablet 2  . Probiotic Product (PRO-BIOTIC BLEND) CAPS Take 1 capsule by mouth every morning. Helps with digestion    . RANEXA 500 MG 12 hr tablet TAKE ONE TABLET BY MOUTH TWICE DAILY 180 tablet 0  . clonazePAM (KLONOPIN) 1 MG tablet Take 0.5-1 tablets (0.5-1 mg total) by mouth 2 (two) times daily as needed for anxiety. (Patient not taking: Reported on 05/02/2015) 60 tablet 1   No current facility-administered medications on file prior to visit.   Allergies  Allergen Reactions  . Codeine Itching  . Celexa [Citalopram] Itching and Rash   Social History   Social History  . Marital Status: Divorced    Spouse Name: N/A  . Number of Children: N/A  . Years of Education: N/A   Occupational History  . Not on file.   Social History Main Topics  . Smoking status: Current Every Day Smoker -- 0.50 packs/day for 30 years    Types: Cigarettes  . Smokeless tobacco: Former Neurosurgeon    Types: Snuff    Quit date: 07/20/1994     Comment: E-Cigarette  . Alcohol Use: Yes     Comment: may have a drink every few months  . Drug Use: No     Comment: former   . Sexual Activity: Not Currently   Other Topics Concern  . Not on file   Social History Narrative   Family History  Problem Relation Age of Onset  . Adopted: Yes  . Cancer Mother   . Diabetes Mother   . Hyperlipidemia Mother   . Hypertension Mother   . Early death Mother   . Cancer Sister   . Diabetes Sister   . Hyperlipidemia Sister   . Hypertension Sister   . Early death Sister        Review of Systems  All other systems reviewed and are negative.      Objective:   Physical Exam  Constitutional: He appears well-developed and well-nourished. No distress.  HENT:  Right Ear: External ear normal.  Left Ear: External ear normal.  Nose: Nose normal.  Mouth/Throat: Oropharynx is clear and moist. No oropharyngeal exudate.  Eyes: Conjunctivae are normal. No scleral icterus.  Neck: Neck supple. No JVD present. No thyromegaly present.  Cardiovascular: Normal rate, regular  rhythm, normal heart sounds and intact distal pulses.  Exam reveals no gallop and no friction rub.   No murmur heard. Pulmonary/Chest: Effort normal and breath sounds normal. No respiratory distress. He has no wheezes. He has no rales. He exhibits no tenderness.  Abdominal: Soft. Bowel sounds are normal. He exhibits no distension and no mass. There is no tenderness. There is no rebound and no guarding.  Lymphadenopathy:    He has no cervical adenopathy.  Skin: He is not diaphoretic.  Vitals reviewed.         Assessment & Plan:  Smoking 1/2 pack a day or less - Plan: varenicline (CHANTIX STARTING MONTH PAK) 0.5 MG X 11 & 1 MG X 42 tablet  CAD in native artery - Plan: CBC with Differential/Platelet, COMPLETE METABOLIC PANEL WITH GFR, Lipid panel  Essential hypertension - Plan: CBC with Differential/Platelet, COMPLETE METABOLIC PANEL WITH GFR, Lipid panel  Hyperlipidemia, mixed - Plan: COMPLETE METABOLIC PANEL WITH GFR, Lipid panel  Hyperglycemia - Plan: COMPLETE METABOLIC PANEL WITH GFR, Hemoglobin A1c  Establishing care with new doctor, encounter for  Blood pressures borderline today. I will continue to monitor this medically. I will check a fasting lipid panel. LDL cholesterol is less than 70. I would like him return fasting for this lab work. I will also check a CMP to monitor his liver function on the statin medication as well as the fenofibrate. I recommended a flu shot but he  declined. Also strongly encouraged the patient quit smoking given his complicated history. He is interested in trying the Chantix starter pack. Given his recent hyperglycemia in the hospital, I will check a hemoglobin A1c. I recommended a flu shot today but he declined.

## 2015-05-07 ENCOUNTER — Other Ambulatory Visit: Payer: Medicare Other

## 2015-05-08 ENCOUNTER — Other Ambulatory Visit: Payer: Self-pay | Admitting: Family Medicine

## 2015-05-08 ENCOUNTER — Other Ambulatory Visit: Payer: Medicare Other

## 2015-05-08 DIAGNOSIS — I2510 Atherosclerotic heart disease of native coronary artery without angina pectoris: Secondary | ICD-10-CM | POA: Diagnosis not present

## 2015-05-08 DIAGNOSIS — E782 Mixed hyperlipidemia: Secondary | ICD-10-CM | POA: Diagnosis not present

## 2015-05-08 DIAGNOSIS — R7309 Other abnormal glucose: Secondary | ICD-10-CM | POA: Diagnosis not present

## 2015-05-08 DIAGNOSIS — R739 Hyperglycemia, unspecified: Secondary | ICD-10-CM | POA: Diagnosis not present

## 2015-05-08 DIAGNOSIS — I10 Essential (primary) hypertension: Secondary | ICD-10-CM | POA: Diagnosis not present

## 2015-05-08 LAB — CBC WITH DIFFERENTIAL/PLATELET
Basophils Absolute: 0 10*3/uL (ref 0.0–0.1)
Basophils Relative: 0 % (ref 0–1)
EOS ABS: 0.2 10*3/uL (ref 0.0–0.7)
EOS PCT: 3 % (ref 0–5)
HEMATOCRIT: 45.4 % (ref 39.0–52.0)
Hemoglobin: 15.1 g/dL (ref 13.0–17.0)
LYMPHS ABS: 2 10*3/uL (ref 0.7–4.0)
Lymphocytes Relative: 25 % (ref 12–46)
MCH: 31.9 pg (ref 26.0–34.0)
MCHC: 33.3 g/dL (ref 30.0–36.0)
MCV: 95.8 fL (ref 78.0–100.0)
MONOS PCT: 6 % (ref 3–12)
MPV: 9.3 fL (ref 8.6–12.4)
Monocytes Absolute: 0.5 10*3/uL (ref 0.1–1.0)
Neutro Abs: 5.3 10*3/uL (ref 1.7–7.7)
Neutrophils Relative %: 66 % (ref 43–77)
PLATELETS: 258 10*3/uL (ref 150–400)
RBC: 4.74 MIL/uL (ref 4.22–5.81)
RDW: 14.6 % (ref 11.5–15.5)
WBC: 8 10*3/uL (ref 4.0–10.5)

## 2015-05-08 LAB — LIPID PANEL
CHOL/HDL RATIO: 4.4 ratio (ref <=5.0)
CHOLESTEROL: 174 mg/dL (ref 125–200)
HDL: 40 mg/dL (ref >=40)
LDL Cholesterol: 76 mg/dL (ref <130)
TRIGLYCERIDES: 289 mg/dL — AB (ref <150)
VLDL: 58 mg/dL — AB (ref <30)

## 2015-05-08 LAB — COMPLETE METABOLIC PANEL WITH GFR
ALK PHOS: 50 U/L (ref 40–115)
ALT: 15 U/L (ref 9–46)
AST: 14 U/L (ref 10–40)
Albumin: 4 g/dL (ref 3.6–5.1)
BUN: 9 mg/dL (ref 7–25)
CALCIUM: 9 mg/dL (ref 8.6–10.3)
CO2: 27 mmol/L (ref 20–31)
Chloride: 105 mmol/L (ref 98–110)
Creat: 1.02 mg/dL (ref 0.60–1.35)
Glucose, Bld: 119 mg/dL — ABNORMAL HIGH (ref 70–99)
Potassium: 4.2 mmol/L (ref 3.5–5.3)
Sodium: 138 mmol/L (ref 135–146)
TOTAL PROTEIN: 6.4 g/dL (ref 6.1–8.1)
Total Bilirubin: 0.6 mg/dL (ref 0.2–1.2)

## 2015-05-09 ENCOUNTER — Encounter: Payer: Self-pay | Admitting: Family Medicine

## 2015-05-09 LAB — HEMOGLOBIN A1C
Hgb A1c MFr Bld: 5.6 % (ref <5.7)
Mean Plasma Glucose: 114 mg/dL (ref <117)

## 2015-05-10 ENCOUNTER — Other Ambulatory Visit: Payer: Self-pay | Admitting: Family Medicine

## 2015-05-10 MED ORDER — RANOLAZINE ER 500 MG PO TB12: 500.0000 mg | ORAL_TABLET | Freq: Two times a day (BID) | ORAL | Status: DC

## 2015-05-10 NOTE — Telephone Encounter (Signed)
I see that the [facility]. His Rxs should now be filled by them.  Thanks, Joanna Puffrystal S. Dorsey, MD [facility] Resident  05/10/2015, 10:06 AM

## 2015-05-10 NOTE — Telephone Encounter (Signed)
PHARMACY: [facility] [address]   MEDICATION: RANEXA   QTY: 500 MG    SIG:    PHYSICIAN: PICKARD   PT. PHONE #: [phone]

## 2015-05-10 NOTE — Telephone Encounter (Signed)
Medication refilled per protocol. 

## 2015-05-13 ENCOUNTER — Telehealth: Payer: Self-pay | Admitting: Family Medicine

## 2015-05-13 MED ORDER — BACLOFEN 10 MG PO TABS: 10.0000 mg | ORAL_TABLET | Freq: Three times a day (TID) | ORAL | Status: DC

## 2015-05-13 MED ORDER — RANOLAZINE ER 500 MG PO TB12: 500.0000 mg | ORAL_TABLET | Freq: Two times a day (BID) | ORAL | Status: DC

## 2015-05-13 NOTE — Telephone Encounter (Signed)
Medication called/sent to requested pharmacy  

## 2015-05-13 NOTE — Telephone Encounter (Signed)
Received fax requesting a refill on Ranexa and Baclofen 10mg  - Ok to refill?

## 2015-05-13 NOTE — Telephone Encounter (Signed)
ok 

## 2015-05-21 ENCOUNTER — Other Ambulatory Visit: Payer: Self-pay | Admitting: Family Medicine

## 2015-05-21 NOTE — Telephone Encounter (Signed)
PHARMACY: [facility] [address]   MEDICATION: LOSARTAN   QTY:    SIG:    PHYSICIAN: PICKARD   PT. PHONE #: [phone]  **PT REQUESTS A HIGHER QUANTITY CALLED IN

## 2015-05-22 MED ORDER — LOSARTAN POTASSIUM 100 MG PO TABS: 100.0000 mg | ORAL_TABLET | Freq: Every day | ORAL | Status: DC

## 2015-05-22 NOTE — Telephone Encounter (Signed)
Refill appropriate and filled per protocol. 

## 2015-06-03 ENCOUNTER — Telehealth: Payer: Self-pay | Admitting: Family Medicine

## 2015-06-03 MED ORDER — VARENICLINE TARTRATE 1 MG PO TABS: 1.0000 mg | ORAL_TABLET | Freq: Two times a day (BID) | ORAL | Status: DC

## 2015-06-03 NOTE — Telephone Encounter (Signed)
Called and spoke to pt and informed no appt necessary and [facility]

## 2015-06-03 NOTE — Telephone Encounter (Signed)
PHARMACY: [facility] [address]   MEDICATION: CHANTIX   QTY:    SIG:    PHYSICIAN: PICKARD   PT. PHONE #: [phone]   *pt was unsure if he needed another appt before he would get a refill. Please advise.

## 2015-06-20 ENCOUNTER — Ambulatory Visit: Payer: Medicare Other | Admitting: Family Medicine

## 2015-06-27 ENCOUNTER — Encounter: Payer: Self-pay | Admitting: Family Medicine

## 2015-06-27 ENCOUNTER — Ambulatory Visit (INDEPENDENT_AMBULATORY_CARE_PROVIDER_SITE_OTHER): Payer: Medicare Other | Admitting: Family Medicine

## 2015-06-27 VITALS — BP 142/76 | HR 82 | Temp 97.7°F | Resp 16 | Wt 275.0 lb

## 2015-06-27 DIAGNOSIS — I2510 Atherosclerotic heart disease of native coronary artery without angina pectoris: Secondary | ICD-10-CM | POA: Diagnosis not present

## 2015-06-27 DIAGNOSIS — M5126 Other intervertebral disc displacement, lumbar region: Secondary | ICD-10-CM

## 2015-06-27 MED ORDER — HYDROCODONE-ACETAMINOPHEN 7.5-325 MG PO TABS: 1.0000 | ORAL_TABLET | Freq: Four times a day (QID) | ORAL | Status: DC | PRN

## 2015-06-27 MED ORDER — VARENICLINE TARTRATE 1 MG PO TABS: 1.0000 mg | ORAL_TABLET | Freq: Two times a day (BID) | ORAL | Status: DC

## 2015-06-27 NOTE — Progress Notes (Signed)
Subjective:    Patient ID: [name], male    DOB: 10/18/1975, 39 y.o.   MRN: [id number]  HPI He has been dealing with low back pain for years. Recently it is intensified severely. The pain is located in the lower lumbar spine. He has a difficult time even getting out of bed due to sharp stabbing pain. The pain radiates down both legs. He complains of numbness and tingling in both feet. He complains of shooting pain going from his lower back all the way down to his feet. It has in particular concerned is that recently his legs began to buckle and give way on him without warning. He almost fell twice due to this. He had an MRI of the lumbar spine in 2013 which did show: L5-S1 broad-based disc osteophyte complex. Extension into the neural foramen with slight encroachment upon but not significant compression of the exiting L5 nerve roots. Minimal impression upon the central ventral aspect of the thecal sac. At that time, he was referred to a pain clinic. However he has not been taking pain medication due to severe opiate-induced constipation. He states he really doesn't want to take pain medication because the way it makes him feel. However he is always been thought to be a poor surgical candidate due to his significant cardiovascular risk and his dual antiplatelet therapy. However he states that the pain is becoming so severe that he would be willing to consider other options if it would help his quality of life Past Medical History  Diagnosis Date  . Hypertension   . MI (myocardial infarction) (HCC)   . Hx of CABG   . Heart attack (HCC)   . Bronchitis   . Tobacco user   . Hyperlipidemia   . MYOCARDIAL INFARCTION 10/13/2008    Qualifier: Diagnosis of  By: Juanda ChanceBrodie, MD, Johny ChessFACC, Bruce Rogers   . Complication of anesthesia     difficulty awakening following appendectomy   . Coronary artery disease   . Numbness and tingling in left arm     comes and goes   . Anginal pain (HCC)     pt  does have to use nitroglycerin   . Pneumonia     hx of in infancy   . Shortness of breath dyspnea     with severe weather changes  . Anxiety   . Numbness in right leg     pt relates to sciatica  . Dizziness     with humidity   . Kidney stones   . Arthritis   . CHF (congestive heart failure) [facility](HCC)    Past Surgical History  Procedure Laterality Date  . Coronary artery bypass graft      status post diaphragmatic wall infarction Rx BMS RCA 03-11-08   . Orthopedic surgery    . Coronary stent placement    . Laparoscopic appendectomy N/A 09/17/2013    Procedure: APPENDECTOMY LAPAROSCOPIC;  Surgeon: Shelly Rubensteinouglas A Blackman, MD;  Location: [facility] OR;  Service: General;  Laterality: N/A;  . Appendectomy    . Left heart catheterization with coronary/graft angiogram N/A 10/19/2011    Procedure: LEFT HEART CATHETERIZATION WITH Isabel CapriceORONARY/GRAFT ANGIOGRAM;  Surgeon: Herby Abrahamhomas D Stuckey, MD;  [facility];  Service: Cardiovascular;  Laterality: N/A;  . Cystoscopy with retrograde pyelogram, ureteroscopy and stent placement Bilateral 03/16/2015    Procedure: CYSTOSCOPY WITH BILATERAL RETROGRADE PYELOGRAM, RIGHT URETEROSCOPY, STONE BASKETRY WITH LASER LITHOTRIPSY ON THE RIGHT,  AND BILATERAL STENT PLACEMENT;  Surgeon: Malen GauzePatrick L McKenzie, MD;  Location: [facility] ORS;  Service: Urology;  Laterality: Bilateral;  . Holmium laser application Right 03/16/2015    Procedure: HOLMIUM LASER APPLICATION;  Surgeon: Malen Gauze, MD;  Location: [facility] OR[facility];  Service: Urology;  Laterality: Right;  . Cardiac catheterization    . Tonsillectomy    . Cystoscopy with retrograde pyelogram, ureteroscopy and stent placement Left 04/02/2015    Procedure: CYSTOSCOPY WITH RETROGRADE PYELOGRAM, URETEROSCOPY REMOVAL BILATERAL STENTS;  Surgeon: Malen Gauze, MD;  Location: [facility];  Service: Urology;  Laterality: Left;   Current Outpatient Prescriptions on File Prior to Visit  Medication Sig Dispense Refill  . aspirin 81 MG tablet Take 81  mg by mouth daily.    Marland Kitchen atorvastatin (LIPITOR) 80 MG tablet TAKE ONE TABLET BY MOUTH ONCE DAILY AT  6  PM 30 tablet 5  . baclofen (LIORESAL) 10 MG tablet Take 1 tablet (10 mg total) by mouth 3 (three) times daily. 90 each 1  . clonazePAM (KLONOPIN) 1 MG tablet Take 0.5-1 tablets (0.5-1 mg total) by mouth 2 (two) times daily as needed for anxiety. 60 tablet 1  . clopidogrel (PLAVIX) 75 MG tablet Take 1 tablet (75 mg total) by mouth every morning. 30 tablet 5  . fenofibrate (TRICOR) 48 MG tablet Take 1 tablet (48 mg total) by mouth daily. 30 tablet 5  . isosorbide mononitrate (IMDUR) 30 MG 24 hr tablet Take 1 tablet (30 mg total) by mouth daily. 30 tablet 5  . losartan (COZAAR) 100 MG tablet Take 1 tablet (100 mg total) by mouth daily. 90 tablet 1  . metoprolol tartrate (LOPRESSOR) 25 MG tablet Take 1 tablet (25 mg total) by mouth 2 (two) times daily. 60 tablet 5  . Multiple Vitamin (MULITIVITAMIN WITH MINERALS) TABS Take 1 tablet by mouth every morning.     . nitroGLYCERIN (NITROSTAT) 0.4 MG SL tablet Place 1 tablet (0.4 mg total) under the tongue every 5 (five) minutes as needed for chest pain. 25 tablet 2  . Probiotic Product (PRO-BIOTIC BLEND) CAPS Take 1 capsule by mouth every morning. Helps with digestion    . ranolazine (RANEXA) 500 MG 12 hr tablet Take 1 tablet (500 mg total) by mouth 2 (two) times daily. 180 tablet 1   No current facility-administered medications on file prior to visit.   Allergies  Allergen Reactions  . Codeine Itching  . Celexa [Citalopram] Itching and Rash   Social History   Social History  . Marital Status: Divorced    Spouse Name: N/A  . Number of Children: N/A  . Years of Education: N/A   Occupational History  . Not on file.   Social History Main Topics  . Smoking status: Current Every Day Smoker -- 0.50 packs/day for 30 years    Types: Cigarettes  . Smokeless tobacco: Former Neurosurgeon    Types: Snuff    Quit date: 07/20/1994     Comment: E-Cigarette  .  Alcohol Use: Yes     Comment: may have a drink every few months  . Drug Use: No     Comment: former   . Sexual Activity: Not Currently   Other Topics Concern  . Not on file   Social History Narrative   Family History  Problem Relation Age of Onset  . Adopted: Yes  . Cancer Mother   . Diabetes Mother   . Hyperlipidemia Mother   . Hypertension Mother   . Early death Mother   . Cancer Sister   . Diabetes Sister   .  Hyperlipidemia Sister   . Hypertension Sister   . Early death Sister       Review of Systems  All other systems reviewed and are negative.      Objective:   Physical Exam  Constitutional: He appears well-developed and well-nourished. No distress.  HENT:  Right Ear: External ear normal.  Left Ear: External ear normal.  Nose: Nose normal.  Mouth/Throat: Oropharynx is clear and moist. No oropharyngeal exudate.  Eyes: Conjunctivae are normal. No scleral icterus.  Neck: Neck supple. No JVD present. No thyromegaly present.  Cardiovascular: Normal rate, regular rhythm, normal heart sounds and intact distal pulses.  Exam reveals no gallop and no friction rub.   No murmur heard. Pulmonary/Chest: Effort normal and breath sounds normal. No respiratory distress. He has no wheezes. He has no rales. He exhibits no tenderness.  Abdominal: Soft. Bowel sounds are normal. He exhibits no distension and no mass. There is no tenderness. There is no rebound and no guarding.  Musculoskeletal:       Lumbar back: He exhibits decreased range of motion, tenderness and pain. He exhibits no spasm.  Lymphadenopathy:    He has no cervical adenopathy.  Skin: He is not diaphoretic.  Vitals reviewed. + staright leg raise bilaterally.        Assessment & Plan:  HNP (herniated nucleus pulposus), lumbar - Plan: HYDROcodone-acetaminophen (NORCO) 7.5-325 MG tablet, MR Lumbar Spine Wo Contrast  Patient has a known herniated disc in his lower spine that 3 years ago was impinging upon the  exiting nerve roots. I'm concerned that he may be developing spinal stenosis at that level based on the symptoms he is having with bilateral leg neuropathy, numbness, weakness, and neuropathic pain. I will obtain an MRI of the lumbar spine to evaluate further. Patient may be a candidate for epidural steroid injections if he is a poor surgical candidate. Meanwhile I will give him Norco 7.5/325 one by mouth every 6 hours when necessary pain. I will give him 30 tablets which should be more than enough to last 1-2 weeks. He can use Movantik 25 mg poqday for constipation.

## 2015-07-02 ENCOUNTER — Telehealth: Payer: Self-pay | Admitting: Family Medicine

## 2015-07-02 MED ORDER — CLONAZEPAM 1 MG PO TABS: 0.5000 mg | ORAL_TABLET | Freq: Two times a day (BID) | ORAL | Status: DC | PRN

## 2015-07-02 NOTE — Telephone Encounter (Signed)
ok 

## 2015-07-02 NOTE — Telephone Encounter (Signed)
?   OK to Refill  - LRF 02/18/15 By Dr. Leonides Schanzorsey #60/1 RF

## 2015-07-02 NOTE — Telephone Encounter (Signed)
Medication called/sent to requested pharmacy  

## 2015-07-02 NOTE — Telephone Encounter (Signed)
Patient is calling to get refill on klonopin  [facility]

## 2015-07-05 ENCOUNTER — Encounter: Payer: Self-pay | Admitting: Family Medicine

## 2015-07-05 ENCOUNTER — Ambulatory Visit
Admission: RE | Admit: 2015-07-05 | Discharge: 2015-07-05 | Disposition: A | Discharge status: Home / Self Care | Payer: Medicare Other | Source: Ambulatory Visit | Attending: Family Medicine | Admitting: Family Medicine

## 2015-07-05 DIAGNOSIS — M5126 Other intervertebral disc displacement, lumbar region: Secondary | ICD-10-CM

## 2015-07-05 DIAGNOSIS — M47816 Spondylosis without myelopathy or radiculopathy, lumbar region: Secondary | ICD-10-CM | POA: Diagnosis not present

## 2015-07-09 ENCOUNTER — Telehealth: Payer: Self-pay | Admitting: Family Medicine

## 2015-07-09 DIAGNOSIS — M5440 Lumbago with sciatica, unspecified side: Secondary | ICD-10-CM

## 2015-07-09 DIAGNOSIS — R937 Abnormal findings on diagnostic imaging of other parts of musculoskeletal system: Secondary | ICD-10-CM

## 2015-07-09 MED ORDER — GABAPENTIN 300 MG PO CAPS: 300.0000 mg | ORAL_CAPSULE | Freq: Three times a day (TID) | ORAL | Status: DC

## 2015-07-09 NOTE — Telephone Encounter (Signed)
Try gabapentin 300 mg potid

## 2015-07-09 NOTE — Telephone Encounter (Signed)
Pt told about new Rx, sent to pharmacy.  Will call with ortho appt as soon as possible.

## 2015-07-09 NOTE — Telephone Encounter (Signed)
Back pain a lot worse.  Having trouble walking, rt leg giving out.  Is having to use cane.  Did not know about MRI results.  Results were given.  Referral initiated to specialist.  Says Hydrocodone is not touching his pain.

## 2015-07-11 ENCOUNTER — Telehealth: Payer: Self-pay | Admitting: Family Medicine

## 2015-07-11 NOTE — Telephone Encounter (Signed)
Noted and pt has appt with [facility] on 07/12/15

## 2015-07-11 NOTE — Telephone Encounter (Signed)
Pt called and states that [facility] will not see him due to a balance that he has from 2002.  Please advise [phone]

## 2015-07-16 ENCOUNTER — Telehealth: Payer: Self-pay | Admitting: Family Medicine

## 2015-07-16 DIAGNOSIS — M5126 Other intervertebral disc displacement, lumbar region: Secondary | ICD-10-CM

## 2015-07-16 NOTE — Telephone Encounter (Signed)
?   OK to Refill - states that the Gabapentin is not helping the numbness in his legs and wants to know what else to do? He has a ortho appt 07/26/15.  Per [facility] Ok to refill hydrocodone for #30 to last until he sees ortho. Hopefully the numbness is coming from a pinched nerve that is why we are sending him to ortho there is nothing we can do for the numbness at this time.

## 2015-07-16 NOTE — Telephone Encounter (Signed)
Patient is calling to say that he needs rx for his hydrocodone and also has questions about his gabapentin Patients appointment with ortho for his back is not until January the 6th      [phone]

## 2015-07-17 MED ORDER — HYDROCODONE-ACETAMINOPHEN 7.5-325 MG PO TABS: 1.0000 | ORAL_TABLET | Freq: Four times a day (QID) | ORAL | Status: DC | PRN

## 2015-07-17 NOTE — Telephone Encounter (Signed)
Pt aware and will come by after 2 to p/u rx. rx printed MBD signed and left up front.

## 2015-07-31 ENCOUNTER — Other Ambulatory Visit: Payer: Self-pay | Admitting: Family Medicine

## 2015-07-31 NOTE — Telephone Encounter (Signed)
Ok to refill 

## 2015-08-01 NOTE — Telephone Encounter (Signed)
ok 

## 2015-08-01 NOTE — Telephone Encounter (Signed)
Prescription sent to pharmacy.

## 2015-08-06 ENCOUNTER — Encounter: Payer: Self-pay | Admitting: Cardiovascular Disease

## 2015-08-09 ENCOUNTER — Ambulatory Visit (INDEPENDENT_AMBULATORY_CARE_PROVIDER_SITE_OTHER): Payer: Medicare Other | Admitting: Family Medicine

## 2015-08-09 ENCOUNTER — Encounter: Payer: Self-pay | Admitting: Family Medicine

## 2015-08-09 VITALS — BP 138/74 | HR 86 | Temp 98.2°F | Resp 18 | Wt 276.0 lb

## 2015-08-09 DIAGNOSIS — M5126 Other intervertebral disc displacement, lumbar region: Secondary | ICD-10-CM | POA: Diagnosis not present

## 2015-08-09 MED ORDER — HYDROCODONE-ACETAMINOPHEN 7.5-325 MG PO TABS: 1.0000 | ORAL_TABLET | Freq: Four times a day (QID) | ORAL | Status: DC | PRN

## 2015-08-09 NOTE — Progress Notes (Signed)
Subjective:    Patient ID: [name], male    DOB: Sep 07, 1975, 40 y.o.   MRN: [id number]  HPI 06/27/15 He has been dealing with low back pain for years. Recently it has intensified severely. The pain is located in the lower lumbar spine. He has a difficult time even getting out of bed due to sharp stabbing pain. The pain radiates down both legs. He complains of numbness and tingling in both feet. He complains of shooting pain going from his lower back all the way down to his feet. It has in particular concerned is that recently his legs began to buckle and give way on him without warning. He almost fell twice due to this. He had an MRI of the lumbar spine in 2013 which did show: L5-S1 broad-based disc osteophyte complex. Extension into the neural foramen with slight encroachment upon but not significant compression of the exiting L5 nerve roots. Minimal impression upon the central ventral aspect of the thecal sac. At that time, he was referred to a pain clinic. However he has not been taking pain medication due to severe opiate-induced constipation. He states he really doesn't want to take pain medication because the way it makes him feel. However he is always been thought to be a poor surgical candidate due to his significant cardiovascular risk and his dual antiplatelet therapy. However he states that the pain is becoming so severe that he would be willing to consider other options if it would help his quality of life.  At that time, my plan was: Patient has a known herniated disc in his lower spine that 3 years ago was impinging upon the exiting nerve roots. I'm concerned that he may be developing spinal stenosis at that level based on the symptoms he is having with bilateral leg neuropathy, numbness, weakness, and neuropathic pain. I will obtain an MRI of the lumbar spine to evaluate further. Patient may be a candidate for epidural steroid injections if he is a poor surgical candidate.  Meanwhile I will give him Norco 7.5/325 one by mouth every 6 hours when necessary pain. I will give him 30 tablets which should be more than enough to last 1-2 weeks. He can use Movantik 25 mg poqday for constipation.  08/09/15 MRI revealed: Worsening of degenerative disc disease at L5-S1 with a shallow broad-based disc herniation more prominent in the left posterior lateral direction. Narrowing of the subarticular lateral recesses, left more than right, which could cause neural compression.  Facet degeneration at L4-5 that could be associated with low back pain. This is also worsened since 2013.   patient is here today for follow-up. He states that he is in terrible pain. He keeps him from sleeping at night. If he is able to take hydrocodone 7. 5/325 by mouth twice a day along with gabapentin and Flexeril , he states that his pain is relatively well controlled. However at the present time he only has gabapentin and he states that he is in terrible pain. I referred him to 2 separate orthopedist including [facility] and [facility]. According to the patient both offices refuse to see him because of his insurance.   Past Medical History  Diagnosis Date  . Hypertension   . MI (myocardial infarction) (HCC)   . Hx of CABG   . Heart attack (HCC)   . Bronchitis   . Tobacco user   . Hyperlipidemia   . MYOCARDIAL INFARCTION 10/13/2008    Qualifier: Diagnosis of  By:  Juanda Chance, MD, Johny Chess   . Complication of anesthesia     difficulty awakening following appendectomy   . Coronary artery disease   . Numbness and tingling in left arm     comes and goes   . Anginal pain (HCC)     pt does have to use nitroglycerin   . Pneumonia     hx of in infancy   . Shortness of breath dyspnea     with severe weather changes  . Anxiety   . Numbness in right leg     pt relates to sciatica  . Dizziness     with humidity   . Kidney stones   . Arthritis   . CHF (congestive  heart failure) [facility])    Past Surgical History  Procedure Laterality Date  . Coronary artery bypass graft      status post diaphragmatic wall infarction Rx BMS RCA 03-11-08   . Orthopedic surgery    . Coronary stent placement    . Laparoscopic appendectomy N/A 09/17/2013    Procedure: APPENDECTOMY LAPAROSCOPIC;  Surgeon: Shelly Rubenstein, MD;  Location: [facility] OR;  Service: General;  Laterality: N/A;  . Appendectomy    . Left heart catheterization with coronary/graft angiogram N/A 10/19/2011    Procedure: LEFT HEART CATHETERIZATION WITH Isabel Caprice;  Surgeon: Herby Abraham, MD;  Location: [facility] CATH LAB;  Service: Cardiovascular;  Laterality: N/A;  . Cystoscopy with retrograde pyelogram, ureteroscopy and stent placement Bilateral 03/16/2015    Procedure: CYSTOSCOPY WITH BILATERAL RETROGRADE PYELOGRAM, RIGHT URETEROSCOPY, STONE BASKETRY WITH LASER LITHOTRIPSY ON THE RIGHT,  AND BILATERAL STENT PLACEMENT;  Surgeon: Malen Gauze, MD;  Location: [facility];  Service: Urology;  Laterality: Bilateral;  . Holmium laser application Right 03/16/2015    Procedure: HOLMIUM LASER APPLICATION;  Surgeon: Malen Gauze, MD;  Location: [facility];  Service: Urology;  Laterality: Right;  . Cardiac catheterization    . Tonsillectomy    . Cystoscopy with retrograde pyelogram, ureteroscopy and stent placement Left 04/02/2015    Procedure: CYSTOSCOPY WITH RETROGRADE PYELOGRAM, URETEROSCOPY REMOVAL BILATERAL STENTS;  Surgeon: Malen Gauze, MD;  Location: [facility];  Service: Urology;  Laterality: Left;   Current Outpatient Prescriptions on File Prior to Visit  Medication Sig Dispense Refill  . aspirin 81 MG tablet Take 81 mg by mouth daily.    Marland Kitchen atorvastatin (LIPITOR) 80 MG tablet TAKE ONE TABLET BY MOUTH ONCE DAILY AT  6  PM 30 tablet 5  . baclofen (LIORESAL) 10 MG tablet TAKE ONE TABLET BY MOUTH THREE TIMES DAILY 90 tablet 0  . clonazePAM (KLONOPIN) 1 MG tablet Take 0.5-1 tablets (0.5-1 mg total) by  mouth 2 (two) times daily as needed for anxiety. 60 tablet 1  . clopidogrel (PLAVIX) 75 MG tablet Take 1 tablet (75 mg total) by mouth every morning. 30 tablet 5  . fenofibrate (TRICOR) 48 MG tablet Take 1 tablet (48 mg total) by mouth daily. 30 tablet 5  . gabapentin (NEURONTIN) 300 MG capsule Take 1 capsule (300 mg total) by mouth 3 (three) times daily. 90 capsule 3  . HYDROcodone-acetaminophen (NORCO) 7.5-325 MG tablet Take 1 tablet by mouth every 6 (six) hours as needed for moderate pain. 30 tablet 0  . isosorbide mononitrate (IMDUR) 30 MG 24 hr tablet Take 1 tablet (30 mg total) by mouth daily. 30 tablet 5  . losartan (COZAAR) 100 MG tablet Take 1 tablet (100 mg total) by mouth daily. 90 tablet 1  .  metoprolol tartrate (LOPRESSOR) 25 MG tablet Take 1 tablet (25 mg total) by mouth 2 (two) times daily. 60 tablet 5  . Multiple Vitamin (MULITIVITAMIN WITH MINERALS) TABS Take 1 tablet by mouth every morning.     . nitroGLYCERIN (NITROSTAT) 0.4 MG SL tablet Place 1 tablet (0.4 mg total) under the tongue every 5 (five) minutes as needed for chest pain. 25 tablet 2  . Probiotic Product (PRO-BIOTIC BLEND) CAPS Take 1 capsule by mouth every morning. Helps with digestion    . ranolazine (RANEXA) 500 MG 12 hr tablet Take 1 tablet (500 mg total) by mouth 2 (two) times daily. 180 tablet 1  . varenicline (CHANTIX CONTINUING MONTH PAK) 1 MG tablet Take 1 tablet (1 mg total) by mouth 2 (two) times daily. 60 tablet 1   No current facility-administered medications on file prior to visit.   Allergies  Allergen Reactions  . Codeine Itching  . Celexa [Citalopram] Itching and Rash   Social History   Social History  . Marital Status: Divorced    Spouse Name: N/A  . Number of Children: N/A  . Years of Education: N/A   Occupational History  . Not on file.   Social History Main Topics  . Smoking status: Current Every Day Smoker -- 0.50 packs/day for 30 years    Types: Cigarettes  . Smokeless tobacco:  Former Neurosurgeon    Types: Snuff    Quit date: 07/20/1994     Comment: E-Cigarette  . Alcohol Use: Yes     Comment: may have a drink every few months  . Drug Use: No     Comment: former   . Sexual Activity: Not Currently   Other Topics Concern  . Not on file   Social History Narrative   Family History  Problem Relation Age of Onset  . Adopted: Yes  . Cancer Mother   . Diabetes Mother   . Hyperlipidemia Mother   . Hypertension Mother   . Early death Mother   . Cancer Sister   . Diabetes Sister   . Hyperlipidemia Sister   . Hypertension Sister   . Early death Sister       Review of Systems  All other systems reviewed and are negative.      Objective:   Physical Exam  Constitutional: He appears well-developed and well-nourished. No distress.  HENT:  Right Ear: External ear normal.  Left Ear: External ear normal.  Nose: Nose normal.  Mouth/Throat: Oropharynx is clear and moist. No oropharyngeal exudate.  Eyes: Conjunctivae are normal. No scleral icterus.  Neck: Neck supple. No JVD present. No thyromegaly present.  Cardiovascular: Normal rate, regular rhythm, normal heart sounds and intact distal pulses.  Exam reveals no gallop and no friction rub.   No murmur heard. Pulmonary/Chest: Effort normal and breath sounds normal. No respiratory distress. He has no wheezes. He has no rales. He exhibits no tenderness.  Abdominal: Soft. Bowel sounds are normal. He exhibits no distension and no mass. There is no tenderness. There is no rebound and no guarding.  Musculoskeletal:       Lumbar back: He exhibits decreased range of motion, tenderness and pain. He exhibits no spasm.  Lymphadenopathy:    He has no cervical adenopathy.  Skin: He is not diaphoretic.  Vitals reviewed. + staright leg raise bilaterally.        Assessment & Plan:  HNP (herniated nucleus pulposus), lumbar - Plan: HYDROcodone-acetaminophen (NORCO) 7.5-325 MG tablet   I will have my  referrals person work  with the patient to try to find an orthopedic office that will accept his insurance. At the present time I will continue to try to manage his pain good faith. I will give the patient Norco 7.5/325 one by mouth twice a day. I will give him 60 tablets per month. He can use Flexeril and gabapentin as well now manage his pain while we await orthopedic consultation. I believe he may benefit from epidural steroid injections.

## 2015-08-14 NOTE — Progress Notes (Signed)
Chief Complaint  Patient presents with  . Follow-up    pt c/o SOB  . Coronary Artery Disease    History of Present Illness: 40 year old male with history of CAD with multiple PCI and a 4-vessel CABG in 2009, HTN, HLD, tobacco abuse who is here today for cardiac follow up. He had been followed by Dr. Riley Kill. His coronary history began in 2009 with an inferior MI requiring RCA PCI and subsequent CABG. He then presented in 2010 with an NSTEMI in 2010 requiring LCx PCI. He underwent repeat coronary angiography in September of 2011 and was found to have stable native and bypass graft anatomy not requiring any intervention at the time. Cardiac cath on 10/19/11 with stable CAD, bypasses. He was discharged home on Imdur and Ranexa.  He was admitted to [facility] May 19-21, 2014 with chest pain. Ruled out for MI with serial cardiac enzymes. He was admitted with acute appendicitis March 2015 and kidney stones requiring extraction September 2016. He now has a herniated disc in his lower back and his having severe back pain.   He is here today for cardiac follow up. He has had no chest pain. Breathing is at baseline. He is only smoking 5 cigarettes per day. He is having severe back pain and is seeing a Careers adviser this week. His wife is expecting a baby in March 2017.   Primary Care Physician: Leo Grosser, MD   Past Medical History  Diagnosis Date  . Hypertension   . MI (myocardial infarction) (HCC)   . Hx of CABG   . Heart attack (HCC)   . Bronchitis   . Tobacco user   . Hyperlipidemia   . MYOCARDIAL INFARCTION 10/13/2008    Qualifier: Diagnosis of  By: Juanda Chance, MD, Johny Chess   . Complication of anesthesia     difficulty awakening following appendectomy   . Coronary artery disease   . Numbness and tingling in left arm     comes and goes   . Anginal pain (HCC)     pt does have to use nitroglycerin   . Pneumonia     hx of in infancy   . Shortness of breath dyspnea     with severe  weather changes  . Anxiety   . Numbness in right leg     pt relates to sciatica  . Dizziness     with humidity   . Kidney stones   . Arthritis   . CHF (congestive heart failure) [facility])     Past Surgical History  Procedure Laterality Date  . Coronary artery bypass graft      status post diaphragmatic wall infarction Rx BMS RCA 03-11-08   . Orthopedic surgery    . Coronary stent placement    . Laparoscopic appendectomy N/A 09/17/2013    Procedure: APPENDECTOMY LAPAROSCOPIC;  Surgeon: Shelly Rubenstein, MD;  Location: [facility] OR;  Service: General;  Laterality: N/A;  . Appendectomy    . Left heart catheterization with coronary/graft angiogram N/A 10/19/2011    Procedure: LEFT HEART CATHETERIZATION WITH Isabel Caprice;  Surgeon: Herby Abraham, MD;  Location: [facility] CATH LAB;  Service: Cardiovascular;  Laterality: N/A;  . Cystoscopy with retrograde pyelogram, ureteroscopy and stent placement Bilateral 03/16/2015    Procedure: CYSTOSCOPY WITH BILATERAL RETROGRADE PYELOGRAM, RIGHT URETEROSCOPY, STONE BASKETRY WITH LASER LITHOTRIPSY ON THE RIGHT,  AND BILATERAL STENT PLACEMENT;  Surgeon: Malen Gauze, MD;  Location: [facility] ORS;  Service: Urology;  Laterality: Bilateral;  .  Holmium laser application Right 03/16/2015    Procedure: HOLMIUM LASER APPLICATION;  Surgeon: Malen Gauze, MD;  Location: [facility] ORS;  Service: Urology;  Laterality: Right;  . Cardiac catheterization    . Tonsillectomy    . Cystoscopy with retrograde pyelogram, ureteroscopy and stent placement Left 04/02/2015    Procedure: CYSTOSCOPY WITH RETROGRADE PYELOGRAM, URETEROSCOPY REMOVAL BILATERAL STENTS;  Surgeon: Malen Gauze, MD;  Location: [facility] ORS;  Service: Urology;  Laterality: Left;    Current Outpatient Prescriptions  Medication Sig Dispense Refill  . atorvastatin (LIPITOR) 80 MG tablet TAKE ONE TABLET BY MOUTH ONCE DAILY AT  6  PM 30 tablet 5  . baclofen (LIORESAL) 10 MG tablet TAKE ONE TABLET BY MOUTH THREE  TIMES DAILY 90 tablet 0  . clonazePAM (KLONOPIN) 1 MG tablet Take 0.5-1 tablets (0.5-1 mg total) by mouth 2 (two) times daily as needed for anxiety. 60 tablet 1  . clopidogrel (PLAVIX) 75 MG tablet Take 1 tablet (75 mg total) by mouth every morning. 30 tablet 5  . fenofibrate (TRICOR) 48 MG tablet Take 1 tablet (48 mg total) by mouth daily. 30 tablet 5  . gabapentin (NEURONTIN) 300 MG capsule Take 1 capsule (300 mg total) by mouth 3 (three) times daily. 90 capsule 3  . HYDROcodone-acetaminophen (NORCO) 7.5-325 MG tablet Take 1 tablet by mouth every 6 (six) hours as needed for moderate pain. 60 tablet 0  . isosorbide mononitrate (IMDUR) 30 MG 24 hr tablet Take 1 tablet (30 mg total) by mouth daily. 30 tablet 5  . losartan (COZAAR) 100 MG tablet Take 1 tablet (100 mg total) by mouth daily. 90 tablet 1  . metoprolol tartrate (LOPRESSOR) 25 MG tablet Take 1 tablet (25 mg total) by mouth 2 (two) times daily. 60 tablet 5  . nitroGLYCERIN (NITROSTAT) 0.4 MG SL tablet Place 1 tablet (0.4 mg total) under the tongue every 5 (five) minutes as needed for chest pain. 25 tablet 2  . Probiotic Product (PRO-BIOTIC BLEND) CAPS Take 1 capsule by mouth every morning. Helps with digestion    . ranolazine (RANEXA) 500 MG 12 hr tablet Take 1 tablet (500 mg total) by mouth 2 (two) times daily. 180 tablet 1   No current facility-administered medications for this visit.    Allergies  Allergen Reactions  . Codeine Itching  . Celexa [Citalopram] Itching and Rash    Social History   Social History  . Marital Status: Divorced    Spouse Name: N/A  . Number of Children: N/A  . Years of Education: N/A   Occupational History  . Not on file.   Social History Main Topics  . Smoking status: Current Every Day Smoker -- 0.50 packs/day for 30 years    Types: Cigarettes  . Smokeless tobacco: Former Neurosurgeon    Types: Snuff    Quit date: 07/20/1994  . Alcohol Use: 0.0 oz/week    0 Standard drinks or equivalent per week       Comment: may have a drink every few months  . Drug Use: No     Comment: former   . Sexual Activity: Not Currently   Other Topics Concern  . Not on file   Social History Narrative    Family History  Problem Relation Age of Onset  . Adopted: Yes  . Cancer Mother   . Diabetes Mother   . Hyperlipidemia Mother   . Hypertension Mother   . Early death Mother   . Cancer Sister   . Diabetes  Sister   . Hyperlipidemia Sister   . Hypertension Sister   . Early death Sister     Review of Systems:  As stated in the HPI and otherwise negative.   BP 110/84 mmHg  Pulse 88  Ht  (1.778 m)  Wt 278 lb 12.8 oz (126.463 kg)  BMI 40.00 kg/m2  SpO2 99%  Physical Examination: General: Well developed, well nourished, NAD HEENT: OP clear, mucus membranes moist SKIN: warm, dry. No rashes. Neuro: No focal deficits Musculoskeletal: Muscle strength 5/5 all ext Psychiatric: Mood and affect normal Neck: No JVD, no carotid bruits, no thyromegaly, no lymphadenopathy. Lungs:Clear bilaterally, no wheezes, rhonci, crackles Cardiovascular: Regular rate and rhythm. No murmurs, gallops or rubs. Abdomen:Soft. Bowel sounds present. Non-tender.  Extremities: No lower extremity edema. Pulses are 2 + in the bilateral DP/PT.  EKG:  EKG is not ordered today. The ekg ordered today demonstrates   Recent Labs: 05/08/2015: ALT 15; BUN 9; Creat 1.02; Hemoglobin 15.1; Platelets 258; Potassium 4.2; Sodium 138   Lipid Panel    Component Value Date/Time   CHOL 174 05/08/2015 0813   TRIG 289* 05/08/2015 0813   HDL 40 05/08/2015 0813   CHOLHDL 4.4 05/08/2015 0813   VLDL 58* 05/08/2015 0813   LDLCALC 76 05/08/2015 0813   LDLDIRECT 103.2 06/15/2012 1446     Wt Readings from Last 3 Encounters:  08/15/15 278 lb 12.8 oz (126.463 kg)  08/09/15 276 lb (125.193 kg)  06/27/15 275 lb (124.739 kg)     Other studies Reviewed: Additional studies/ records that were reviewed today include: . Review of the  above records demonstrates:    Assessment and Plan:   1. CAD: Stable. Cath April 2013 with stable severe disease s/p CABG. Will continue Plavix, statin, beta blocker, ARB, Ranexa and Imdur. He does not tolerate ASA due to GI upset.  If he needs to have back surgery, will need to have a Lexiscan nuclear stress test arranged.   2. HYPERLIPIDEMIA/Hypertriglyceridemia. Continue statin and Tricor. Lipids well controlled.   3. TOBACCO USE: He has started back smoking. Smoking cessation counseling today.   4. HTN: BP well controlled. No changes.    Current medicines are reviewed at length with the patient today.  The patient does not have concerns regarding medicines.  The following changes have been made:  no change  Labs/ tests ordered today include:   No orders of the defined types were placed in this encounter.    Disposition:   FU with me in 6 months  Signed, Verne Carrow, MD 08/15/2015 9:19 AM    [facility] [address] [place], [place], [place]  [address] Phone: [phone]; Fax: [phone]

## 2015-08-15 ENCOUNTER — Encounter: Payer: Self-pay | Admitting: Cardiovascular Disease

## 2015-08-15 ENCOUNTER — Ambulatory Visit (INDEPENDENT_AMBULATORY_CARE_PROVIDER_SITE_OTHER): Payer: Medicare Other | Admitting: Cardiovascular Disease

## 2015-08-15 VITALS — BP 110/84 | HR 88 | Ht 70.0 in | Wt 278.8 lb

## 2015-08-15 DIAGNOSIS — E785 Hyperlipidemia, unspecified: Secondary | ICD-10-CM

## 2015-08-15 DIAGNOSIS — I2510 Atherosclerotic heart disease of native coronary artery without angina pectoris: Secondary | ICD-10-CM | POA: Diagnosis not present

## 2015-08-15 DIAGNOSIS — I10 Essential (primary) hypertension: Secondary | ICD-10-CM | POA: Diagnosis not present

## 2015-08-15 DIAGNOSIS — Z720 Tobacco use: Secondary | ICD-10-CM

## 2015-08-15 NOTE — Patient Instructions (Signed)

## 2015-08-23 DIAGNOSIS — M545 Low back pain: Secondary | ICD-10-CM | POA: Diagnosis not present

## 2015-08-23 DIAGNOSIS — M4696 Unspecified inflammatory spondylopathy, lumbar region: Secondary | ICD-10-CM | POA: Diagnosis not present

## 2015-08-26 ENCOUNTER — Other Ambulatory Visit: Payer: Self-pay | Admitting: Family Medicine

## 2015-08-27 NOTE — Telephone Encounter (Signed)
Medication called to pharmacy. 

## 2015-08-27 NOTE — Telephone Encounter (Signed)
ok 

## 2015-08-27 NOTE — Telephone Encounter (Signed)
Ok to refill??  Last office visit 08/09/2015.  Last refill Clonazepam 07/02/2015, #1 refill.   Last refill Baclofen 08/01/2015.

## 2015-09-09 ENCOUNTER — Ambulatory Visit (INDEPENDENT_AMBULATORY_CARE_PROVIDER_SITE_OTHER): Payer: Medicare Other | Admitting: Family Medicine

## 2015-09-09 ENCOUNTER — Encounter: Payer: Self-pay | Admitting: Family Medicine

## 2015-09-09 VITALS — BP 118/72 | HR 82 | Temp 97.8°F | Resp 18 | Ht 72.0 in | Wt 276.0 lb

## 2015-09-09 DIAGNOSIS — M5126 Other intervertebral disc displacement, lumbar region: Secondary | ICD-10-CM | POA: Diagnosis not present

## 2015-09-09 DIAGNOSIS — I2510 Atherosclerotic heart disease of native coronary artery without angina pectoris: Secondary | ICD-10-CM | POA: Diagnosis not present

## 2015-09-09 NOTE — Progress Notes (Signed)
Subjective:    Patient ID: [name], male    DOB: 01/08/1976, 40 y.o.   MRN: [id number]  HPI 06/27/15 He has been dealing with low back pain for years. Recently it has intensified severely. The pain is located in the lower lumbar spine. He has a difficult time even getting out of bed due to sharp stabbing pain. The pain radiates down both legs. He complains of numbness and tingling in both feet. He complains of shooting pain going from his lower back all the way down to his feet. It has in particular concerned is that recently his legs began to buckle and give way on him without warning. He almost fell twice due to this. He had an MRI of the lumbar spine in 2013 which did show: L5-S1 broad-based disc osteophyte complex. Extension into the neural foramen with slight encroachment upon but not significant compression of the exiting L5 nerve roots. Minimal impression upon the central ventral aspect of the thecal sac. At that time, he was referred to a pain clinic. However he has not been taking pain medication due to severe opiate-induced constipation. He states he really doesn't want to take pain medication because the way it makes him feel. However he is always been thought to be a poor surgical candidate due to his significant cardiovascular risk and his dual antiplatelet therapy. However he states that the pain is becoming so severe that he would be willing to consider other options if it would help his quality of life.  At that time, my plan was: Patient has a known herniated disc in his lower spine that 3 years ago was impinging upon the exiting nerve roots. I'm concerned that he may be developing spinal stenosis at that level based on the symptoms he is having with bilateral leg neuropathy, numbness, weakness, and neuropathic pain. I will obtain an MRI of the lumbar spine to evaluate further. Patient may be a candidate for epidural steroid injections if he is a poor surgical candidate.  Meanwhile I will give him Norco 7.5/325 one by mouth every 6 hours when necessary pain. I will give him 30 tablets which should be more than enough to last 1-2 weeks. He can use Movantik 25 mg poqday for constipation.  08/09/15 MRI revealed: Worsening of degenerative disc disease at L5-S1 with a shallow broad-based disc herniation more prominent in the left posterior lateral direction. Narrowing of the subarticular lateral recesses, left more than right, which could cause neural compression.  Facet degeneration at L4-5 that could be associated with low back pain. This is also worsened since 2013.  Patient is here today for follow-up. He states that he is in terrible pain. He keeps him from sleeping at night. If he is able to take hydrocodone 7. 5/325 by mouth twice a day along with gabapentin and Flexeril , he states that his pain is relatively well controlled. However at the present time he only has gabapentin and he states that he is in terrible pain. I referred him to 2 separate orthopedist including  orthopedics and [place] [facility]. According to the patient both offices refuse to see him because of his insurance.  At that time, my plan was:  I will have my referrals person work with the patient to try to find an orthopedic office that will accept his insurance. At the present time I will continue to try to manage his pain good faith. I will give the patient Norco 7.5/325 one by mouth twice a day.  I will give him 60 tablets per month. He can use Flexeril and gabapentin as well now manage his pain while we await orthopedic consultation. I believe he may benefit from epidural steroid injections.  09/09/15  Now that the weather has warmed up, the patient's back is no longer bothering him. This agrees with what he told me earlier. He only gets severe pain in his back when he gets extremely cold. Over the last 2 weeks he is not requiring any pain medication he still has 30 of the Norco  left. Therefore the present time he has no interest in epidural steroid injections.  Past Medical History  Diagnosis Date  . Hypertension   . MI (myocardial infarction) (HCC)   . Hx of CABG   . Heart attack (HCC)   . Bronchitis   . Tobacco user   . Hyperlipidemia   . MYOCARDIAL INFARCTION 10/13/2008    Qualifier: Diagnosis of  By: Juanda Chance, MD, Johny Chess   . Complication of anesthesia     difficulty awakening following appendectomy   . Coronary artery disease   . Numbness and tingling in left arm     comes and goes   . Anginal pain (HCC)     pt does have to use nitroglycerin   . Pneumonia     hx of in infancy   . Shortness of breath dyspnea     with severe weather changes  . Anxiety   . Numbness in right leg     pt relates to sciatica  . Dizziness     with humidity   . Kidney stones   . Arthritis   . CHF (congestive heart failure) [facility])    Past Surgical History  Procedure Laterality Date  . Coronary artery bypass graft      status post diaphragmatic wall infarction Rx BMS RCA 03-11-08   . Orthopedic surgery    . Coronary stent placement    . Laparoscopic appendectomy N/A 09/17/2013    Procedure: APPENDECTOMY LAPAROSCOPIC;  Surgeon: Shelly Rubenstein, MD;  Location: [facility] OR;  Service: General;  Laterality: N/A;  . Appendectomy    . Left heart catheterization with coronary/graft angiogram N/A 10/19/2011    Procedure: LEFT HEART CATHETERIZATION WITH Isabel Caprice;  Surgeon: Herby Abraham, MD;  Location: [facility] CATH LAB;  Service: Cardiovascular;  Laterality: N/A;  . Cystoscopy with retrograde pyelogram, ureteroscopy and stent placement Bilateral 03/16/2015    Procedure: CYSTOSCOPY WITH BILATERAL RETROGRADE PYELOGRAM, RIGHT URETEROSCOPY, STONE BASKETRY WITH LASER LITHOTRIPSY ON THE RIGHT,  AND BILATERAL STENT PLACEMENT;  Surgeon: Malen Gauze, MD;  Location: [facility] ORS;  Service: Urology;  Laterality: Bilateral;  . Holmium laser application Right 03/16/2015     Procedure: HOLMIUM LASER APPLICATION;  Surgeon: Malen Gauze, MD;  Location: [facility]S;  Service: Urology;  Laterality: Right;  . Cardiac catheterization    . Tonsillectomy    . Cystoscopy with retrograde pyelogram, ureteroscopy and stent placement Left 04/02/2015    Procedure: CYSTOSCOPY WITH RETROGRADE PYELOGRAM, URETEROSCOPY REMOVAL BILATERAL STENTS;  Surgeon: Malen Gauze, MD;  Location: [facility] OR[facility];  Service: Urology;  Laterality: Left;   Current Outpatient Prescriptions on File Prior to Visit  Medication Sig Dispense Refill  . atorvastatin (LIPITOR) 80 MG tablet TAKE ONE TABLET BY MOUTH ONCE DAILY AT  6  PM 30 tablet 5  . baclofen (LIORESAL) 10 MG tablet TAKE ONE TABLET BY MOUTH THREE TIMES DAILY 90 tablet 0  . clonazePAM (KLONOPIN) 1 MG tablet  TAKE ONE-HALF TO ONE TABLET BY MOUTH TWICE DAILY AS NEEDED FOR ANXIETY 60 tablet 1  . clopidogrel (PLAVIX) 75 MG tablet Take 1 tablet (75 mg total) by mouth every morning. 30 tablet 5  . fenofibrate (TRICOR) 48 MG tablet Take 1 tablet (48 mg total) by mouth daily. 30 tablet 5  . gabapentin (NEURONTIN) 300 MG capsule Take 1 capsule (300 mg total) by mouth 3 (three) times daily. 90 capsule 3  . HYDROcodone-acetaminophen (NORCO) 7.5-325 MG tablet Take 1 tablet by mouth every 6 (six) hours as needed for moderate pain. 60 tablet 0  . isosorbide mononitrate (IMDUR) 30 MG 24 hr tablet Take 1 tablet (30 mg total) by mouth daily. 30 tablet 5  . losartan (COZAAR) 100 MG tablet Take 1 tablet (100 mg total) by mouth daily. 90 tablet 1  . metoprolol tartrate (LOPRESSOR) 25 MG tablet Take 1 tablet (25 mg total) by mouth 2 (two) times daily. 60 tablet 5  . nitroGLYCERIN (NITROSTAT) 0.4 MG SL tablet Place 1 tablet (0.4 mg total) under the tongue every 5 (five) minutes as needed for chest pain. 25 tablet 2  . Probiotic Product (PRO-BIOTIC BLEND) CAPS Take 1 capsule by mouth every morning. Helps with digestion    . ranolazine (RANEXA) 500 MG 12 hr tablet Take 1  tablet (500 mg total) by mouth 2 (two) times daily. 180 tablet 1   No current facility-administered medications on file prior to visit.   Allergies  Allergen Reactions  . Codeine Itching  . Celexa [Citalopram] Itching and Rash   Social History   Social History  . Marital Status: Divorced    Spouse Name: N/A  . Number of Children: N/A  . Years of Education: N/A   Occupational History  . Not on file.   Social History Main Topics  . Smoking status: Current Every Day Smoker -- 0.50 packs/day for 30 years    Types: Cigarettes  . Smokeless tobacco: Former Neurosurgeon    Types: Snuff    Quit date: 07/20/1994  . Alcohol Use: 0.0 oz/week    0 Standard drinks or equivalent per week     Comment: may have a drink every few months  . Drug Use: No     Comment: former   . Sexual Activity: Not Currently   Other Topics Concern  . Not on file   Social History Narrative   Family History  Problem Relation Age of Onset  . Adopted: Yes  . Cancer Mother   . Diabetes Mother   . Hyperlipidemia Mother   . Hypertension Mother   . Early death Mother   . Cancer Sister   . Diabetes Sister   . Hyperlipidemia Sister   . Hypertension Sister   . Early death Sister       Review of Systems  All other systems reviewed and are negative.      Objective:   Physical Exam  Constitutional: He appears well-developed and well-nourished. No distress.  HENT:  Right Ear: External ear normal.  Left Ear: External ear normal.  Nose: Nose normal.  Mouth/Throat: Oropharynx is clear and moist. No oropharyngeal exudate.  Eyes: Conjunctivae are normal. No scleral icterus.  Neck: Neck supple. No JVD present. No thyromegaly present.  Cardiovascular: Normal rate, regular rhythm, normal heart sounds and intact distal pulses.  Exam reveals no gallop and no friction rub.   No murmur heard. Pulmonary/Chest: Effort normal and breath sounds normal. No respiratory distress. He has no wheezes. He has no  rales. He  exhibits no tenderness.  Abdominal: Soft. Bowel sounds are normal. He exhibits no distension and no mass. There is no tenderness. There is no rebound and no guarding.  Musculoskeletal:       Lumbar back: He exhibits normal range of motion, no tenderness, no pain and no spasm.  Lymphadenopathy:    He has no cervical adenopathy.  Skin: He is not diaphoretic.  Vitals reviewed.         Assessment & Plan:  HNP (herniated nucleus pulposus), lumbar  Symptomatically, the patient is doing much better and is almost 100% pain-free. Therefore we will not refill his Norco prescription at the present time. If he is using the medication sparingly, I would be willing to refill his pain medication 2-3 months out of the year when it's  Extremely cold which seems to cause him the most problem. I believe taking the medication sparingly only during those periods of the year will not cause any long-term problems and certainly does not put him at risk for habituation and dependency.

## 2015-09-16 ENCOUNTER — Telehealth: Payer: Self-pay | Admitting: Family Medicine

## 2015-09-16 NOTE — Telephone Encounter (Signed)
[phone] Patient wants to know since he has stopped taking the pain meds for his back can he stop taking gabapentin too?

## 2015-09-17 NOTE — Telephone Encounter (Signed)
Yes but can resume if pain returns.

## 2015-09-17 NOTE — Telephone Encounter (Signed)
Pt aware.

## 2015-10-12 ENCOUNTER — Other Ambulatory Visit: Payer: Self-pay | Admitting: Family Medicine

## 2015-10-16 ENCOUNTER — Other Ambulatory Visit: Payer: Self-pay | Admitting: Family Medicine

## 2015-10-16 MED ORDER — FENOFIBRATE 48 MG PO TABS: 48.0000 mg | ORAL_TABLET | Freq: Every day | ORAL | Status: DC

## 2015-10-16 MED ORDER — CLOPIDOGREL BISULFATE 75 MG PO TABS: 75.0000 mg | ORAL_TABLET | Freq: Every morning | ORAL | Status: DC

## 2015-10-16 MED ORDER — ATORVASTATIN CALCIUM 80 MG PO TABS: ORAL_TABLET | ORAL | Status: DC

## 2015-10-16 NOTE — Telephone Encounter (Signed)
Medication called/sent to requested pharmacy  

## 2015-10-17 ENCOUNTER — Telehealth: Payer: Self-pay | Admitting: Family Medicine

## 2015-10-17 MED ORDER — METOPROLOL TARTRATE 25 MG PO TABS: 25.0000 mg | ORAL_TABLET | Freq: Two times a day (BID) | ORAL | Status: DC

## 2015-10-17 MED ORDER — ISOSORBIDE MONONITRATE ER 30 MG PO TB24: 30.0000 mg | ORAL_TABLET | Freq: Every day | ORAL | Status: DC

## 2015-10-17 NOTE — Telephone Encounter (Signed)
Medication called/sent to requested pharmacy  

## 2015-10-17 NOTE — Telephone Encounter (Signed)
PT NEEDS A REFILL OF METOPROLOL AND IMDUR 30 MG SENT TO THE [facility] ON [address]. [phone]

## 2015-10-25 ENCOUNTER — Other Ambulatory Visit: Payer: Self-pay | Admitting: Family Medicine

## 2015-10-25 NOTE — Telephone Encounter (Signed)
Okay 

## 2015-10-25 NOTE — Telephone Encounter (Signed)
?   OK to Refill  

## 2015-10-28 ENCOUNTER — Other Ambulatory Visit: Payer: Self-pay | Admitting: Family Medicine

## 2015-11-14 ENCOUNTER — Telehealth: Payer: Self-pay | Admitting: Family Medicine

## 2015-11-14 DIAGNOSIS — M5126 Other intervertebral disc displacement, lumbar region: Secondary | ICD-10-CM

## 2015-11-14 MED ORDER — HYDROCODONE-ACETAMINOPHEN 7.5-325 MG PO TABS: 1.0000 | ORAL_TABLET | Freq: Four times a day (QID) | ORAL | Status: DC | PRN

## 2015-11-14 NOTE — Telephone Encounter (Signed)
ok 

## 2015-11-14 NOTE — Telephone Encounter (Signed)
Prescription printed and patient made aware to come to office to pick up after 4pm on 11/14/2015.

## 2015-11-14 NOTE — Telephone Encounter (Signed)
Pt needs a refill of Hydrocodone [phone]

## 2015-11-14 NOTE — Telephone Encounter (Signed)
Ok to refill 

## 2015-11-15 ENCOUNTER — Ambulatory Visit (INDEPENDENT_AMBULATORY_CARE_PROVIDER_SITE_OTHER): Payer: Medicare Other | Admitting: Family Medicine

## 2015-11-15 ENCOUNTER — Emergency Department (HOSPITAL_COMMUNITY): Payer: Medicare Other

## 2015-11-15 ENCOUNTER — Observation Stay (HOSPITAL_COMMUNITY)
Admission: EM | Admit: 2015-11-15 | Discharge: 2015-11-16 | Disposition: A | Discharge status: Home / Self Care | Payer: Medicare Other | Attending: Cardiology | Admitting: Cardiology

## 2015-11-15 ENCOUNTER — Encounter (HOSPITAL_COMMUNITY): Payer: Self-pay

## 2015-11-15 ENCOUNTER — Encounter: Payer: Self-pay | Admitting: Family Medicine

## 2015-11-15 VITALS — BP 140/84 | Temp 98.1°F | Resp 20 | Ht 72.0 in | Wt 268.0 lb

## 2015-11-15 DIAGNOSIS — Z79899 Other long term (current) drug therapy: Secondary | ICD-10-CM | POA: Diagnosis not present

## 2015-11-15 DIAGNOSIS — F419 Anxiety disorder, unspecified: Secondary | ICD-10-CM | POA: Insufficient documentation

## 2015-11-15 DIAGNOSIS — Z955 Presence of coronary angioplasty implant and graft: Secondary | ICD-10-CM | POA: Diagnosis not present

## 2015-11-15 DIAGNOSIS — R55 Syncope and collapse: Secondary | ICD-10-CM | POA: Insufficient documentation

## 2015-11-15 DIAGNOSIS — R079 Chest pain, unspecified: Secondary | ICD-10-CM | POA: Diagnosis not present

## 2015-11-15 DIAGNOSIS — I509 Heart failure, unspecified: Secondary | ICD-10-CM | POA: Diagnosis not present

## 2015-11-15 DIAGNOSIS — R072 Precordial pain: Principal | ICD-10-CM | POA: Diagnosis present

## 2015-11-15 DIAGNOSIS — R0789 Other chest pain: Secondary | ICD-10-CM | POA: Diagnosis not present

## 2015-11-15 DIAGNOSIS — E785 Hyperlipidemia, unspecified: Secondary | ICD-10-CM | POA: Insufficient documentation

## 2015-11-15 DIAGNOSIS — I252 Old myocardial infarction: Secondary | ICD-10-CM | POA: Insufficient documentation

## 2015-11-15 DIAGNOSIS — I2510 Atherosclerotic heart disease of native coronary artery without angina pectoris: Secondary | ICD-10-CM

## 2015-11-15 DIAGNOSIS — F17210 Nicotine dependence, cigarettes, uncomplicated: Secondary | ICD-10-CM | POA: Diagnosis not present

## 2015-11-15 DIAGNOSIS — Z7902 Long term (current) use of antithrombotics/antiplatelets: Secondary | ICD-10-CM | POA: Insufficient documentation

## 2015-11-15 DIAGNOSIS — Z720 Tobacco use: Secondary | ICD-10-CM

## 2015-11-15 DIAGNOSIS — R51 Headache: Secondary | ICD-10-CM | POA: Diagnosis not present

## 2015-11-15 DIAGNOSIS — I10 Essential (primary) hypertension: Secondary | ICD-10-CM | POA: Diagnosis not present

## 2015-11-15 DIAGNOSIS — I249 Acute ischemic heart disease, unspecified: Secondary | ICD-10-CM

## 2015-11-15 DIAGNOSIS — I110 Hypertensive heart disease with heart failure: Secondary | ICD-10-CM | POA: Diagnosis not present

## 2015-11-15 LAB — CBC
HCT: 40.7 % (ref 39.0–52.0)
HEMATOCRIT: 40.9 % (ref 39.0–52.0)
Hemoglobin: 13.6 g/dL (ref 13.0–17.0)
Hemoglobin: 13.6 g/dL (ref 13.0–17.0)
MCH: 31 pg (ref 26.0–34.0)
MCH: 31.1 pg (ref 26.0–34.0)
MCHC: 33.3 g/dL (ref 30.0–36.0)
MCHC: 33.4 g/dL (ref 30.0–36.0)
MCV: 93.2 fL (ref 78.0–100.0)
MCV: 93.1 fL (ref 78.0–100.0)
PLATELETS: 252 10*3/uL (ref 150–400)
Platelets: 232 K/uL (ref 150–400)
RBC: 4.39 MIL/uL (ref 4.22–5.81)
RBC: 4.37 MIL/uL (ref 4.22–5.81)
RDW: 13.8 % (ref 11.5–15.5)
RDW: 13.8 % (ref 11.5–15.5)
WBC: 7.3 10*3/uL (ref 4.0–10.5)
WBC: 7.5 K/uL (ref 4.0–10.5)

## 2015-11-15 LAB — TROPONIN I: Troponin I: <0.03 ng/mL (ref <0.031)

## 2015-11-15 LAB — COMPREHENSIVE METABOLIC PANEL
ALT: 21 U/L (ref 17–63)
AST: 21 U/L (ref 15–41)
Albumin: 3.8 g/dL (ref 3.5–5.0)
Alkaline Phosphatase: 43 U/L (ref 38–126)
Anion gap: 10 (ref 5–15)
BILIRUBIN TOTAL: 0.6 mg/dL (ref 0.3–1.2)
BUN: 6 mg/dL (ref 6–20)
CHLORIDE: 109 mmol/L (ref 101–111)
CO2: 22 mmol/L (ref 22–32)
CREATININE: 1.08 mg/dL (ref 0.61–1.24)
Calcium: 9.1 mg/dL (ref 8.9–10.3)
GFR calc Af Amer: >60 mL/min (ref >60)
Glucose, Bld: 92 mg/dL (ref 65–99)
Potassium: 3.8 mmol/L (ref 3.5–5.1)
Sodium: 141 mmol/L (ref 135–145)
Total Protein: 6.4 g/dL — ABNORMAL LOW (ref 6.5–8.1)

## 2015-11-15 LAB — BRAIN NATRIURETIC PEPTIDE: B Natriuretic Peptide: 21.4 pg/mL (ref 0.0–100.0)

## 2015-11-15 LAB — CREATININE, SERUM
Creatinine, Ser: 1.21 mg/dL (ref 0.61–1.24)
GFR calc non Af Amer: >60 mL/min (ref >60)

## 2015-11-15 MED ORDER — MORPHINE SULFATE (PF) 4 MG/ML IV SOLN
4.0000 mg | Freq: Once | INTRAVENOUS | Status: AC
  Administered 2015-11-15: 4 mg via INTRAVENOUS
  Filled 2015-11-15: qty 1

## 2015-11-15 MED ORDER — RANOLAZINE ER 500 MG PO TB12
500.0000 mg | ORAL_TABLET | Freq: Two times a day (BID) | ORAL | Status: DC
  Administered 2015-11-15 – 2015-11-16 (×2): 500 mg via ORAL
  Filled 2015-11-15 (×2): qty 1

## 2015-11-15 MED ORDER — METOPROLOL TARTRATE 25 MG PO TABS
25.0000 mg | ORAL_TABLET | Freq: Two times a day (BID) | ORAL | Status: DC
  Administered 2015-11-15 – 2015-11-16 (×2): 25 mg via ORAL
  Filled 2015-11-15 (×2): qty 1

## 2015-11-15 MED ORDER — LOSARTAN POTASSIUM 50 MG PO TABS
100.0000 mg | ORAL_TABLET | Freq: Every day | ORAL | Status: DC
  Administered 2015-11-16: 100 mg via ORAL
  Filled 2015-11-15: qty 2

## 2015-11-15 MED ORDER — HEPARIN SODIUM (PORCINE) 5000 UNIT/ML IJ SOLN
5000.0000 [IU] | Freq: Three times a day (TID) | INTRAMUSCULAR | Status: DC
  Filled 2015-11-15: qty 1

## 2015-11-15 MED ORDER — FENOFIBRATE 54 MG PO TABS
54.0000 mg | ORAL_TABLET | Freq: Every day | ORAL | Status: DC
  Administered 2015-11-16: 54 mg via ORAL
  Filled 2015-11-15: qty 1

## 2015-11-15 MED ORDER — CLONAZEPAM 1 MG PO TABS: 1.0000 mg | ORAL_TABLET | Freq: Two times a day (BID) | ORAL | Status: DC | PRN

## 2015-11-15 MED ORDER — NITROGLYCERIN 2 % TD OINT
1.0000 [in_us] | TOPICAL_OINTMENT | Freq: Once | TRANSDERMAL | Status: AC
  Administered 2015-11-15: 1 [in_us] via TOPICAL
  Filled 2015-11-15: qty 1

## 2015-11-15 MED ORDER — ATORVASTATIN CALCIUM 80 MG PO TABS: 80.0000 mg | ORAL_TABLET | Freq: Every day | ORAL | Status: DC

## 2015-11-15 MED ORDER — ACETAMINOPHEN 325 MG PO TABS
650.0000 mg | ORAL_TABLET | ORAL | Status: DC | PRN
  Administered 2015-11-16: 650 mg via ORAL
  Filled 2015-11-15: qty 2

## 2015-11-15 MED ORDER — NITROGLYCERIN 2 % TD OINT
1.0000 [in_us] | TOPICAL_OINTMENT | Freq: Four times a day (QID) | TRANSDERMAL | Status: DC
  Administered 2015-11-15: 1 [in_us] via TOPICAL
  Filled 2015-11-15: qty 30

## 2015-11-15 MED ORDER — ONDANSETRON HCL 4 MG/2ML IJ SOLN
4.0000 mg | Freq: Once | INTRAMUSCULAR | Status: AC
  Administered 2015-11-15: 4 mg via INTRAVENOUS
  Filled 2015-11-15: qty 2

## 2015-11-15 MED ORDER — GABAPENTIN 300 MG PO CAPS
300.0000 mg | ORAL_CAPSULE | Freq: Three times a day (TID) | ORAL | Status: DC
  Administered 2015-11-15 – 2015-11-16 (×2): 300 mg via ORAL
  Filled 2015-11-15 (×2): qty 1

## 2015-11-15 MED ORDER — ONDANSETRON HCL 4 MG/2ML IJ SOLN: 4.0000 mg | Freq: Four times a day (QID) | INTRAMUSCULAR | Status: DC | PRN

## 2015-11-15 MED ORDER — CLOPIDOGREL BISULFATE 75 MG PO TABS
75.0000 mg | ORAL_TABLET | Freq: Every morning | ORAL | Status: DC
  Administered 2015-11-16: 75 mg via ORAL
  Filled 2015-11-15: qty 1

## 2015-11-15 NOTE — H&P (Signed)
CARDIOLOGY ADMISSION NOTE  Patient ID: [name] MRN: [id number] DOB/AGE: 40-Jun-1977 40 y.o.  Admit date: 11/15/2015 Primary Physician   Leo Grosser, MD Primary Cardiologist   Dr. Clifton Melchor Kirchgessner Chief Complaint    Chest pain  HPI:  The patient has a history of CAD.   He [place] status post MI in 2009 requiring RCA PCI.  He had 4-vessel CABG 2009 (LIMA-LAD, SVG to ramus to OM1, SVG to LADD1).  He had an MI in 2010 requiring LCx PC.  LHCath in 03/2010 demonstrated severe native disease (80% prox LAD, totally occluded ramus, 40% in-stent RCA lesion, 100% mid-PDA) with patent LIMA-LAD and chronically occluded SVG bypass grafts and EF 45% with inferior HK.   Last cath in 2013 demonstrated a preserved EF.  Has 70% stenosis in the LAD with a patent LIMA.  He had an SVG to the diagonal that was occluded.  SVG to RI occluded.  Circ had 90% stenosis in a small diffusely disease RI.    There was a mid 40% RCA stenosis at a previously stented site.  RCA had a PL with 80 - 90% stenosis.  He was managed medically.    He presents with chest pain.    He says that he always has chest pain.  However, it comes and goes.  The pain he has been having this week has been a constant mid chest discomfort that has been 5/10.  It has not gone away but it has not been worse with activity.  It is not positional and not brought on with movement or breathing.   He did have some nausea today and some mild SOB a couple of times this week.  He has not had radiation.  He has had no diaphoresis.  He got NTG in the primary care office apparently without relief.  However, his pain is not gone after ASA and NTG paste in the ED.  EKG without acute changes and initial enzymes are negative.   Past Medical History  Diagnosis Date  . Hypertension   . Hx of CABG   . Tobacco user   . Hyperlipidemia   . MYOCARDIAL INFARCTION 10/13/2008    Qualifier: Diagnosis of  By: Juanda Chance, MD, Johny Chess   . Complication of anesthesia    difficulty awakening following appendectomy   . Anxiety   . Numbness in right leg     pt relates to sciatica  . Dizziness     with humidity   . Kidney stones   . Arthritis   . CHF (congestive heart failure) [facility])     Past Surgical History  Procedure Laterality Date  . Coronary artery bypass graft      status post diaphragmatic wall infarction Rx BMS RCA 03-11-08   . Orthopedic surgery    . Coronary stent placement    . Laparoscopic appendectomy N/A 09/17/2013    Procedure: APPENDECTOMY LAPAROSCOPIC;  Surgeon: Shelly Rubenstein, MD;  Location: [facility] OR;  Service: General;  Laterality: N/A;  . Appendectomy    . Left heart catheterization with coronary/graft angiogram N/A 10/19/2011    Procedure: LEFT HEART CATHETERIZATION WITH Isabel Caprice;  Surgeon: Herby Abraham, MD;  Location: [facility] CATH LAB;  Service: Cardiovascular;  Laterality: N/A;  . Cystoscopy with retrograde pyelogram, ureteroscopy and stent placement Bilateral 03/16/2015    Procedure: CYSTOSCOPY WITH BILATERAL RETROGRADE PYELOGRAM, RIGHT URETEROSCOPY, STONE BASKETRY WITH LASER LITHOTRIPSY ON THE RIGHT,  AND BILATERAL STENT PLACEMENT;  Surgeon: Malen Gauze, MD;  Location: [facility] ORS;  Service: Urology;  Laterality: Bilateral;  . Holmium laser application Right 03/16/2015    Procedure: HOLMIUM LASER APPLICATION;  Surgeon: Malen Gauze, MD;  Location: [facility] ORS;  Service: Urology;  Laterality: Right;  . Cardiac catheterization    . Tonsillectomy    . Cystoscopy with retrograde pyelogram, ureteroscopy and stent placement Left 04/02/2015    Procedure: CYSTOSCOPY WITH RETROGRADE PYELOGRAM, URETEROSCOPY REMOVAL BILATERAL STENTS;  Surgeon: Malen Gauze, MD;  Location: [facility] ORS;  Service: Urology;  Laterality: Left;    Allergies  Allergen Reactions  . Celexa [Citalopram] Itching and Rash  . Codeine Itching   No current facility-administered medications on file prior to encounter.   Current Outpatient Prescriptions on  File Prior to Encounter  Medication Sig Dispense Refill  . atorvastatin (LIPITOR) 80 MG tablet TAKE ONE TABLET BY MOUTH ONCE DAILY AT  6  PM 30 tablet 5  . baclofen (LIORESAL) 10 MG tablet TAKE ONE TABLET BY MOUTH THREE TIMES DAILY (Patient taking differently: TAKE ONE TABLET BY MOUTH THREE TIMES DAILY AS NEEDED FOR MUSCLE SPASMS) 90 tablet 0  . clonazePAM (KLONOPIN) 1 MG tablet TAKE ONE-HALF TO ONE TABLET BY MOUTH TWICE DAILY AS NEEDED FOR ANXIETY (  MUST  AT  LEAST  30  DAYS  FOR  EACH  FILL  ) (Patient taking differently: TAKE ONE TABLET BY MOUTH TWICE DAILY FOR ANXIETY (  MUST  AT  LEAST  30  DAYS  FOR  EACH  FILL )) 60 tablet 2  . clopidogrel (PLAVIX) 75 MG tablet Take 1 tablet (75 mg total) by mouth every morning. 30 tablet 5  . fenofibrate (TRICOR) 48 MG tablet Take 1 tablet (48 mg total) by mouth daily. 30 tablet 5  . gabapentin (NEURONTIN) 300 MG capsule Take 1 capsule (300 mg total) by mouth 3 (three) times daily. (Patient taking differently: Take 300 mg by mouth 3 (three) times daily as needed (FOR NEUROPATHY). ) 90 capsule 3  . isosorbide mononitrate (IMDUR) 30 MG 24 hr tablet Take 1 tablet (30 mg total) by mouth daily. 30 tablet 5  . losartan (COZAAR) 100 MG tablet Take 1 tablet (100 mg total) by mouth daily. 90 tablet 1  . metoprolol tartrate (LOPRESSOR) 25 MG tablet Take 1 tablet (25 mg total) by mouth 2 (two) times daily. 60 tablet 5  . nitroGLYCERIN (NITROSTAT) 0.4 MG SL tablet Place 1 tablet (0.4 mg total) under the tongue every 5 (five) minutes as needed for chest pain. 25 tablet 2  . ranolazine (RANEXA) 500 MG 12 hr tablet Take 1 tablet (500 mg total) by mouth 2 (two) times daily. 180 tablet 1  . HYDROcodone-acetaminophen (NORCO) 7.5-325 MG tablet Take 1 tablet by mouth every 6 (six) hours as needed for moderate pain. 60 tablet 0   Social History   Social History  . Marital Status: Divorced    Spouse Name: N/A  . Number of Children: N/A  . Years of Education: N/A    Occupational History  . Not on file.   Social History Main Topics  . Smoking status: Current Every Day Smoker -- 0.50 packs/day for 30 years    Types: Cigarettes  . Smokeless tobacco: Former Neurosurgeon    Types: Snuff    Quit date: 07/20/1994  . Alcohol Use: 0.0 oz/week    0 Standard drinks or equivalent per week     Comment: may have a drink every few months  . Drug Use: No  Comment: former   . Sexual Activity: Not Currently   Other Topics Concern  . Not on file   Social History Narrative    Family History  Problem Relation Age of Onset  . Adopted: Yes  . Cancer Mother   . Diabetes Mother   . Hyperlipidemia Mother   . Hypertension Mother   . Early death Mother   . Cancer Sister   . Diabetes Sister   . Hyperlipidemia Sister   . Hypertension Sister   . Early death Sister      ROS:  As stated in the HPI and negative for all other systems.  Physical Exam: Blood pressure 102/72, pulse 71, resp. rate 11, SpO2 97 %.  GENERAL:  Well appearing HEENT:  Pupils equal round and reactive, fundi not visualized, oral mucosa unremarkable NECK:  No jugular venous distention, waveform within normal limits, carotid upstroke brisk and symmetric, no bruits, no thyromegaly LYMPHATICS:  No cervical, inguinal adenopathy LUNGS:  Clear to auscultation bilaterally BACK:  No CVA tenderness CHEST:  Well healed sternotomy scar. HEART:  PMI not displaced or sustained,S1 and S2 within normal limits, no S3, no S4, no clicks, no rubs, no murmurs ABD:  Flat, positive bowel sounds normal in frequency in pitch, no bruits, no rebound, no guarding, no midline pulsatile mass, no hepatomegaly, no splenomegaly EXT:  2 plus pulses throughout, no edema, no cyanosis no clubbing SKIN:  No rashes no nodules NEURO:  Cranial nerves II through XII grossly intact, motor grossly intact throughout PSYCH:  Cognitively intact, oriented to person place and time   Labs: Lab Results  Component Value Date   BUN 6  11/15/2015   Lab Results  Component Value Date   CREATININE 1.08 11/15/2015   Lab Results  Component Value Date   NA 141 11/15/2015   K 3.8 11/15/2015   CL 109 11/15/2015   CO2 22 11/15/2015   Lab Results  Component Value Date   TROPONINI <0.03 11/15/2015   Lab Results  Component Value Date   WBC 7.3 11/15/2015   HGB 13.6 11/15/2015   HCT 40.9 11/15/2015   MCV 93.2 11/15/2015   PLT 252 11/15/2015   Lab Results  Component Value Date   CHOL 174 05/08/2015   HDL 40 05/08/2015   LDLCALC 76 05/08/2015   LDLDIRECT 103.2 06/15/2012   TRIG 289* 05/08/2015   CHOLHDL 4.4 05/08/2015   Lab Results  Component Value Date   ALT 21 11/15/2015   AST 21 11/15/2015   ALKPHOS 43 11/15/2015   BILITOT 0.6 11/15/2015      Radiology:  CXR:  Lung volumes are normal. No consolidative airspace disease. No pleural effusions. No pneumothorax. No pulmonary nodule or mass noted. Pulmonary vasculature and the cardiomediastinal silhouette are within normal limits. Atherosclerosis in the thoracic aorta. Status post median sternotomy for CABG.   EKG:  NSR, rate 80.  Old inferior posterior MI.  No acute ST T wave changes.  11/15/2015  ASSESSMENT AND PLAN:    CHEST PAIN:  No objective evidence of ischemia.  He would like to go home but I convinced him to stay for observation.  He will continue the NTG paste.  If he is OK with negative enzymes I think that it would be reasonable to discharge in the AM with an increased dose of NTG PO (120 mg).  Follow up with Dr. Clifton Dinara Lupu to consider [facility].  TOBACCO ABUSE:  He understands the need to quit smoking.    DYSLIPIDEMIA:  Continue current meds.   SignedRollene Rotunda: Trayon Krantz 11/15/2015, 8:50 PM

## 2015-11-15 NOTE — ED Notes (Signed)
Per EMS: Pt complaining of left chest pain x 1 week. Was seen by PCP today sent here. EMS gave 324 ASA and 2 nitro. Pain is 5/10. Hx: MI, CABG.

## 2015-11-15 NOTE — ED Provider Notes (Signed)
CSN: [id number]     Arrival date & time 11/15/15  1707 History   First MD Initiated Contact with Patient 11/15/15 1725     Chief Complaint  Patient presents with  . Chest Pain  PT IS HERE WITH CP.  DESPITE HIS AGE, HE HAS A LONG HX OF CORONARY PROBLEMS.  HE HAS HAD A CABG AND MULTIPLE STENTS.  HE PRESENTED TO HIS PCP TODAY WITH CP FOR A WEEK.  THE PCP CALLED EMS AND SENT HIM HERE.  THE PT WAS GIVEN ASA AND NITRO BY EMS.  PT'S CP WENT FROM A 10 TO A 5.     (Consider location/radiation/quality/duration/timing/severity/associated sxs/prior Treatment) Patient is a 40 y.o. male presenting with chest pain. The history is provided by the patient.  Chest Pain Pain location:  Substernal area Pain quality: pressure   Pain radiates to:  Does not radiate Pain radiates to the back: no   Pain severity:  Mild Onset quality:  Sudden Timing:  Constant Progression:  Partially resolved Chronicity:  Recurrent Relieved by:  Nothing   Past Medical History  Diagnosis Date  . Hypertension   . Hx of CABG   . Tobacco user   . Hyperlipidemia   . MYOCARDIAL INFARCTION 10/13/2008    Qualifier: Diagnosis of  By: Juanda Chance, MD, Johny Chess   . Complication of anesthesia     difficulty awakening following appendectomy   . Anxiety   . Numbness in right leg     pt relates to sciatica  . Dizziness     with humidity   . Kidney stones   . Arthritis   . CHF (congestive heart failure) [facility])    Past Surgical History  Procedure Laterality Date  . Coronary artery bypass graft      status post diaphragmatic wall infarction Rx BMS RCA 03-11-08   . Orthopedic surgery    . Coronary stent placement    . Laparoscopic appendectomy N/A 09/17/2013    Procedure: APPENDECTOMY LAPAROSCOPIC;  Surgeon: Shelly Rubenstein, MD;  Location: [facility] OR;  Service: General;  Laterality: N/A;  . Appendectomy    . Left heart catheterization with coronary/graft angiogram N/A 10/19/2011    Procedure: LEFT HEART CATHETERIZATION WITH  Isabel Caprice;  Surgeon: Herby Abraham, MD;  Location: [facility] CATH LAB;  Service: Cardiovascular;  Laterality: N/A;  . Cystoscopy with retrograde pyelogram, ureteroscopy and stent placement Bilateral 03/16/2015    Procedure: CYSTOSCOPY WITH BILATERAL RETROGRADE PYELOGRAM, RIGHT URETEROSCOPY, STONE BASKETRY WITH LASER LITHOTRIPSY ON THE RIGHT,  AND BILATERAL STENT PLACEMENT;  Surgeon: Malen Gauze, MD;  Location: [facility] ORS;  Service: Urology;  Laterality: Bilateral;  . Holmium laser application Right 03/16/2015    Procedure: HOLMIUM LASER APPLICATION;  Surgeon: Malen Gauze, MD;  Location: [facility] ORS;  Service: Urology;  Laterality: Right;  . Cardiac catheterization    . Tonsillectomy    . Cystoscopy with retrograde pyelogram, ureteroscopy and stent placement Left 04/02/2015    Procedure: CYSTOSCOPY WITH RETROGRADE PYELOGRAM, URETEROSCOPY REMOVAL BILATERAL STENTS;  Surgeon: Malen Gauze, MD;  Location: [facility] ORS;  Service: Urology;  Laterality: Left;   Family History  Problem Relation Age of Onset  . Adopted: Yes  . Cancer Mother   . Diabetes Mother   . Hyperlipidemia Mother   . Hypertension Mother   . Heart failure Mother     Died age 39  . Cancer Sister   . Diabetes Sister   . Hyperlipidemia Sister   . Hypertension Sister  Social History  Substance Use Topics  . Smoking status: Current Every Day Smoker -- 0.50 packs/day for 30 years    Types: Cigarettes  . Smokeless tobacco: Former NeurosurgeonUser    Types: Snuff    Quit date: 07/20/1994  . Alcohol Use: 0.0 oz/week    0 Standard drinks or equivalent per week     Comment: may have a drink every few months    Review of Systems  Cardiovascular: Positive for chest pain.  All other systems reviewed and are negative.     Allergies  Celexa and Codeine  Home Medications   Prior to Admission medications   Medication Sig Start Date End Date Taking? Authorizing Provider  atorvastatin (LIPITOR) 80 MG tablet TAKE ONE  TABLET BY MOUTH ONCE DAILY AT  6  PM 10/16/15  Yes Donita BrooksWarren T Pickard, MD  baclofen (LIORESAL) 10 MG tablet TAKE ONE TABLET BY MOUTH THREE TIMES DAILY Patient taking differently: TAKE ONE TABLET BY MOUTH THREE TIMES DAILY AS NEEDED FOR MUSCLE SPASMS 08/27/15  Yes Salley ScarletKawanta F [place], MD  clonazePAM (KLONOPIN) 1 MG tablet TAKE ONE-HALF TO ONE TABLET BY MOUTH TWICE DAILY AS NEEDED FOR ANXIETY (  MUST  AT  LEAST  30  DAYS  FOR  EACH  FILL  ) Patient taking differently: TAKE ONE TABLET BY MOUTH TWICE DAILY FOR ANXIETY (  MUST  AT  LEAST  30  DAYS  FOR  EACH  FILL ) 10/28/15  Yes Donita BrooksWarren T Pickard, MD  clopidogrel (PLAVIX) 75 MG tablet Take 1 tablet (75 mg total) by mouth every morning. 10/16/15  Yes Donita BrooksWarren T Pickard, MD  fenofibrate (TRICOR) 48 MG tablet Take 1 tablet (48 mg total) by mouth daily. 10/16/15  Yes Donita BrooksWarren T Pickard, MD  gabapentin (NEURONTIN) 300 MG capsule Take 1 capsule (300 mg total) by mouth 3 (three) times daily. Patient taking differently: Take 300 mg by mouth 3 (three) times daily as needed (FOR NEUROPATHY).  07/09/15  Yes Donita BrooksWarren T Pickard, MD  isosorbide mononitrate (IMDUR) 30 MG 24 hr tablet Take 1 tablet (30 mg total) by mouth daily. 10/17/15  Yes Donita BrooksWarren T Pickard, MD  losartan (COZAAR) 100 MG tablet Take 1 tablet (100 mg total) by mouth daily. 05/22/15  Yes Donita BrooksWarren T Pickard, MD  metoprolol tartrate (LOPRESSOR) 25 MG tablet Take 1 tablet (25 mg total) by mouth 2 (two) times daily. 10/17/15  Yes Donita BrooksWarren T Pickard, MD  nitroGLYCERIN (NITROSTAT) 0.4 MG SL tablet Place 1 tablet (0.4 mg total) under the tongue every 5 (five) minutes as needed for chest pain. 08/08/13  Yes Laurey Moralealton S McLean, MD  ranolazine (RANEXA) 500 MG 12 hr tablet Take 1 tablet (500 mg total) by mouth 2 (two) times daily. 05/13/15  Yes Donita BrooksWarren T Pickard, MD  HYDROcodone-acetaminophen (NORCO) 7.5-325 MG tablet Take 1 tablet by mouth every 6 (six) hours as needed for moderate pain. 11/14/15   Donita BrooksWarren T Pickard, MD   BP 113/82 mmHg  Pulse  62  Resp 11  SpO2 99% Physical Exam  Constitutional: He is oriented to person, place, and time. He appears well-developed and well-nourished.  HENT:  Head: Normocephalic and atraumatic.  Eyes: Conjunctivae and EOM are normal. Pupils are equal, round, and reactive to light.  Neck: Normal range of motion. Neck supple.  Cardiovascular: Normal rate, regular rhythm, normal heart sounds and intact distal pulses.   Pulmonary/Chest: Effort normal and breath sounds normal.  Abdominal: Soft. Bowel sounds are normal.  Musculoskeletal: Normal range of motion.  Neurological: He is alert and oriented to person, place, and time.  Skin: Skin is warm and dry.  Psychiatric: He has a normal mood and affect. His behavior is normal.  Nursing note and vitals reviewed.   ED Course  Procedures (including critical care time) Labs Review Labs Reviewed  COMPREHENSIVE METABOLIC PANEL - Abnormal; Notable for the following:    Total Protein 6.4 (*)    All other components within normal limits  CBC  TROPONIN I  BRAIN NATRIURETIC PEPTIDE    Imaging Review Dg Chest 2 View  11/15/2015  CLINICAL DATA:  40 year old male with history of headache after taking nitroglycerin a couple of hours ago. EXAM: CHEST  2 VIEW COMPARISON:  Chest x-ray 07/23/2014. FINDINGS: Lung volumes are normal. No consolidative airspace disease. No pleural effusions. No pneumothorax. No pulmonary nodule or mass noted. Pulmonary vasculature and the cardiomediastinal silhouette are within normal limits. Atherosclerosis in the thoracic aorta. Status post median sternotomy for CABG. IMPRESSION: 1.  No radiographic evidence of acute cardiopulmonary disease. 2. Atherosclerosis. Electronically Signed   By: Trudie Reed M.D.   On: 11/15/2015 18:11   I have personally reviewed and evaluated these images and lab results as part of my medical decision-making.   EKG Interpretation   Date/Time:  Friday November 15 2015 17:08:05 EDT Ventricular Rate:   80 PR Interval:  159 QRS Duration: 88 QT Interval:  367 QTC Calculation: 423 R Axis:   23 Text Interpretation:  Sinus rhythm Abnormal R-wave progression, early  transition Confirmed by [facility] MD, Jaylon Boylen ([id number]) on 11/15/2015 5:17:17 PM      MDM  PT D/W CARDIOLOGIST (DR. HOCHREIN) WHO WILL ADMIT PT.  Final diagnoses:  ACS (acute coronary syndrome) (HCC)  Tobacco abuse       Jacalyn Lefevre, MD 11/15/15 2145

## 2015-11-15 NOTE — Progress Notes (Signed)
Subjective:    Patient ID: [name], male    DOB: 03/25/1976, 40 y.o.   MRN: [id number]  HPI Patient presents today with one-week of chest pain. I have copied portions from his cardiologist most recent office visit outlining his cardiac history below:  His coronary history began in 2009 with an inferior MI requiring RCA PCI and subsequent CABG. He then presented in 2010 with an NSTEMI in 2010 requiring LCx PCI. He underwent repeat coronary angiography in September of 2011 and was found to have stable native and bypass graft anatomy not requiring any intervention at the time. Cardiac cath on 10/19/11 with stable CAD, bypasses. He was discharged home on Imdur and Ranexa.  Patient describes the pain as a vice-like pressure in the center of his chest. The pain is constant with no exacerbating or alleviating factors. He reports shortness of breath and tingling pain in his left arm. This is been constant for 1 week. He denies any cough. He denies any fever or hemoptysis. He denies any nausea or vomiting. He denies any indigestion or heartburn or exacerbation of the pain with food. He appears comfortable in the exam room. EKG shows normal sinus rhythm with inferolateral Q waves that are chronic and old and unchanged from previous EKGs. There is no evidence of any acute ischemia or any EKG changes. However there is no other explanation for the pain as the pain began gradually without injury. Patient reports the pain as a 6 on a scale 1-10 Past Medical History  Diagnosis Date  . Hypertension   . MI (myocardial infarction) (HCC)   . Hx of CABG   . Heart attack (HCC)   . Bronchitis   . Tobacco user   . Hyperlipidemia   . MYOCARDIAL INFARCTION 10/13/2008    Qualifier: Diagnosis of  By: Juanda Chance, MD, Johny Chess   . Complication of anesthesia     difficulty awakening following appendectomy   . Coronary artery disease   . Numbness and tingling in left arm     comes and goes   . Anginal pain  (HCC)     pt does have to use nitroglycerin   . Pneumonia     hx of in infancy   . Shortness of breath dyspnea     with severe weather changes  . Anxiety   . Numbness in right leg     pt relates to sciatica  . Dizziness     with humidity   . Kidney stones   . Arthritis   . CHF (congestive heart failure) [facility])    Past Surgical History  Procedure Laterality Date  . Coronary artery bypass graft      status post diaphragmatic wall infarction Rx BMS RCA 03-11-08   . Orthopedic surgery    . Coronary stent placement    . Laparoscopic appendectomy N/A 09/17/2013    Procedure: APPENDECTOMY LAPAROSCOPIC;  Surgeon: Shelly Rubenstein, MD;  Location: [facility] OR;  Service: General;  Laterality: N/A;  . Appendectomy    . Left heart catheterization with coronary/graft angiogram N/A 10/19/2011    Procedure: LEFT HEART CATHETERIZATION WITH Isabel Caprice;  Surgeon: Herby Abraham, MD;  Location: [facility] CATH LAB;  Service: Cardiovascular;  Laterality: N/A;  . Cystoscopy with retrograde pyelogram, ureteroscopy and stent placement Bilateral 03/16/2015    Procedure: CYSTOSCOPY WITH BILATERAL RETROGRADE PYELOGRAM, RIGHT URETEROSCOPY, STONE BASKETRY WITH LASER LITHOTRIPSY ON THE RIGHT,  AND BILATERAL STENT PLACEMENT;  Surgeon: Malen Gauze, MD;  Location: [facility] ORS;  Service: Urology;  Laterality: Bilateral;  . Holmium laser application Right 03/16/2015    Procedure: HOLMIUM LASER APPLICATION;  Surgeon: Malen Gauze, MD;  Location: [facility] ORS;  Service: Urology;  Laterality: Right;  . Cardiac catheterization    . Tonsillectomy    . Cystoscopy with retrograde pyelogram, ureteroscopy and stent placement Left 04/02/2015    Procedure: CYSTOSCOPY WITH RETROGRADE PYELOGRAM, URETEROSCOPY REMOVAL BILATERAL STENTS;  Surgeon: Malen Gauze, MD;  Location: [facility] ORS;  Service: Urology;  Laterality: Left;   Current Outpatient Prescriptions on File Prior to Visit  Medication Sig Dispense Refill  . atorvastatin  (LIPITOR) 80 MG tablet TAKE ONE TABLET BY MOUTH ONCE DAILY AT  6  PM 30 tablet 5  . baclofen (LIORESAL) 10 MG tablet TAKE ONE TABLET BY MOUTH THREE TIMES DAILY 90 tablet 0  . clonazePAM (KLONOPIN) 1 MG tablet TAKE ONE-HALF TO ONE TABLET BY MOUTH TWICE DAILY AS NEEDED FOR ANXIETY (  MUST  AT  LEAST  30  DAYS  FOR  EACH  FILL  ) 60 tablet 2  . clopidogrel (PLAVIX) 75 MG tablet Take 1 tablet (75 mg total) by mouth every morning. 30 tablet 5  . fenofibrate (TRICOR) 48 MG tablet Take 1 tablet (48 mg total) by mouth daily. 30 tablet 5  . gabapentin (NEURONTIN) 300 MG capsule Take 1 capsule (300 mg total) by mouth 3 (three) times daily. 90 capsule 3  . HYDROcodone-acetaminophen (NORCO) 7.5-325 MG tablet Take 1 tablet by mouth every 6 (six) hours as needed for moderate pain. 60 tablet 0  . isosorbide mononitrate (IMDUR) 30 MG 24 hr tablet Take 1 tablet (30 mg total) by mouth daily. 30 tablet 5  . losartan (COZAAR) 100 MG tablet Take 1 tablet (100 mg total) by mouth daily. 90 tablet 1  . metoprolol tartrate (LOPRESSOR) 25 MG tablet Take 1 tablet (25 mg total) by mouth 2 (two) times daily. 60 tablet 5  . nitroGLYCERIN (NITROSTAT) 0.4 MG SL tablet Place 1 tablet (0.4 mg total) under the tongue every 5 (five) minutes as needed for chest pain. 25 tablet 2  . Probiotic Product (PRO-BIOTIC BLEND) CAPS Take 1 capsule by mouth every morning. Helps with digestion    . ranolazine (RANEXA) 500 MG 12 hr tablet Take 1 tablet (500 mg total) by mouth 2 (two) times daily. 180 tablet 1   No current facility-administered medications on file prior to visit.   Allergies  Allergen Reactions  . Codeine Itching  . Celexa [Citalopram] Itching and Rash   Social History   Social History  . Marital Status: Divorced    Spouse Name: N/A  . Number of Children: N/A  . Years of Education: N/A   Occupational History  . Not on file.   Social History Main Topics  . Smoking status: Current Every Day Smoker -- 0.50 packs/day for  30 years    Types: Cigarettes  . Smokeless tobacco: Former Neurosurgeon    Types: Snuff    Quit date: 07/20/1994  . Alcohol Use: 0.0 oz/week    0 Standard drinks or equivalent per week     Comment: may have a drink every few months  . Drug Use: No     Comment: former   . Sexual Activity: Not Currently   Other Topics Concern  . Not on file   Social History Narrative     Review of Systems  All other systems reviewed and are negative.  Objective:   Physical Exam  Constitutional: He appears well-developed and well-nourished. No distress.  Neck: No JVD present.  Cardiovascular: Normal rate, regular rhythm and normal heart sounds.   Pulmonary/Chest: Effort normal and breath sounds normal. No respiratory distress. He has no wheezes. He has no rales.  Abdominal: Soft. Bowel sounds are normal. He exhibits no distension. There is no tenderness. There is no rebound and no guarding.  Musculoskeletal: He exhibits no edema.  Skin: He is not diaphoretic.  Vitals reviewed.         Assessment & Plan:  Other chest pain - Plan: EKG 12-Lead  Patient does not appear in any distress. EKG is unchanged. However his history is concerning and given his past medical history I do not feel comfortable sending him home with out a cardiac rule out given there is no other obvious explanation for his symptoms.  Patient received 4, 81 mg aspirins at 4 PM along with 0.4 mg of nitroglycerin sublingually. He is placed on 2 L of oxygen via nasal cannula and EMS was called.

## 2015-11-16 DIAGNOSIS — I252 Old myocardial infarction: Secondary | ICD-10-CM | POA: Diagnosis not present

## 2015-11-16 DIAGNOSIS — I2510 Atherosclerotic heart disease of native coronary artery without angina pectoris: Secondary | ICD-10-CM | POA: Diagnosis present

## 2015-11-16 DIAGNOSIS — E785 Hyperlipidemia, unspecified: Secondary | ICD-10-CM | POA: Diagnosis not present

## 2015-11-16 DIAGNOSIS — R072 Precordial pain: Secondary | ICD-10-CM | POA: Diagnosis not present

## 2015-11-16 LAB — TROPONIN I
Troponin I: <0.03 ng/mL (ref <0.031)
Troponin I: <0.03 ng/mL (ref <0.031)

## 2015-11-16 MED ORDER — ISOSORBIDE MONONITRATE ER 60 MG PO TB24
60.0000 mg | ORAL_TABLET | Freq: Every day | ORAL | Status: DC
  Administered 2015-11-16: 60 mg via ORAL
  Filled 2015-11-16: qty 1

## 2015-11-16 MED ORDER — ISOSORBIDE MONONITRATE ER 60 MG PO TB24: 60.0000 mg | ORAL_TABLET | Freq: Every day | ORAL | Status: DC

## 2015-11-16 NOTE — Discharge Summary (Signed)
Discharge Summary    Patient ID: [name],  MRN: [id number], DOB/AGE: Jun 11, 1976 40 y.o.  Admit date: 11/15/2015 Discharge date: 11/16/2015  Primary Care Provider: [facility] Primary Cardiologist: Dr Clifton James  Discharge Diagnoses    Principal Problem:   Precordial chest pain Active Problems:   CAD (coronary artery disease), native coronary artery   Allergies Allergies  Allergen Reactions  . Celexa [Citalopram] Itching and Rash  . Codeine Itching    Diagnostic Studies/Procedures    CXR - results below _____________   History of Present Illness     40 yo male w/ hx CABG 2009, PCI 2010, med rx for CAD after last cath 2013, EF preserved. He was admitted 04/28 with chest pain.  Hospital Course     Consultants: none   Mr Buttrey's pain was constant, not positional, not pleuritic. Initial enzymes were negative and ECG unchanged. Smoking cessation was encouraged. His chest X ray had no significant abnormalities.  Overnight, Mr Conely's chest pain resolved. Cardiac enzymes remained negative. There were no significant arrhythmias.   On 04/29, he was seen by Dr Rennis Golden and all data were reviewed. His Imdur was increased, and he tolerated it well. He will follow up with Dr Clifton James to decide on further evaluation. Dr Rennis Golden considered him stable for discharge, to follow up as an outpatient. _____________  Discharge Vitals Blood pressure 106/72, pulse 69, temperature 97.5 F (36.4 C), temperature source Oral, resp. rate 17, height  (1.753 m), weight 268 lb 9.6 oz (121.836 kg), SpO2 96 %.  Filed Weights   11/15/15 2219  Weight: 268 lb 9.6 oz (121.836 kg)    Labs & Radiologic Studies    CBC  Recent Labs  11/15/15 1833 11/15/15 2230  WBC 7.3 7.5  HGB 13.6 13.6  HCT 40.9 40.7  MCV 93.2 93.1  PLT 252 232   Basic Metabolic Panel  Recent Labs  11/15/15 1833 11/15/15 2230  NA 141  --   K 3.8  --   CL 109  --   CO2 22  --   GLUCOSE 92  --     BUN 6  --   CREATININE 1.08 1.21  CALCIUM 9.1  --    Liver Function Tests  Recent Labs  11/15/15 1833  AST 21  ALT 21  ALKPHOS 43  BILITOT 0.6  PROT 6.4*  ALBUMIN 3.8   Cardiac Enzymes  Recent Labs  11/15/15 2230 11/16/15 0216 11/16/15 0435  TROPONINI <0.03 <0.03 <0.03  _____________  Dg Chest 2 View 11/15/2015  CLINICAL DATA:  40 year old male with history of headache after taking nitroglycerin a couple of hours ago. EXAM: CHEST  2 VIEW COMPARISON:  Chest x-ray 07/23/2014. FINDINGS: Lung volumes are normal. No consolidative airspace disease. No pleural effusions. No pneumothorax. No pulmonary nodule or mass noted. Pulmonary vasculature and the cardiomediastinal silhouette are within normal limits. Atherosclerosis in the thoracic aorta. Status post median sternotomy for CABG. IMPRESSION: 1.  No radiographic evidence of acute cardiopulmonary disease. 2. Atherosclerosis. Electronically Signed   By: Trudie Reed M.D.   On: 11/15/2015 18:11   Disposition   Pt is being discharged home today in good condition.  Follow-up Plans & Appointments    Follow-up Information    Follow up with Verne Carrow, MD.   Specialty:  Cardiology   Why:  The office will call.   Contact information:   [address] [place] [address] [phone]      Discharge  Instructions    Diet - low sodium heart healthy    Complete by:  As directed      Increase activity slowly    Complete by:  As directed            Discharge Medications   Current Discharge Medication List    CONTINUE these medications which have CHANGED   Details  isosorbide mononitrate (IMDUR) 60 MG 24 hr tablet Take 1 tablet (60 mg total) by mouth daily. Qty: 30 tablet, Refills: 11      CONTINUE these medications which have NOT CHANGED   Details  atorvastatin (LIPITOR) 80 MG tablet TAKE ONE TABLET BY MOUTH ONCE DAILY AT  6  PM Qty: 30 tablet, Refills: 5    baclofen (LIORESAL) 10 MG  tablet TAKE ONE TABLET BY MOUTH THREE TIMES DAILY Qty: 90 tablet, Refills: 0    clonazePAM (KLONOPIN) 1 MG tablet TAKE ONE-HALF TO ONE TABLET BY MOUTH TWICE DAILY AS NEEDED FOR ANXIETY (  MUST  AT  LEAST  30  DAYS  FOR  EACH  FILL  ) Qty: 60 tablet, Refills: 2    clopidogrel (PLAVIX) 75 MG tablet Take 1 tablet (75 mg total) by mouth every morning. Qty: 30 tablet, Refills: 5    fenofibrate (TRICOR) 48 MG tablet Take 1 tablet (48 mg total) by mouth daily. Qty: 30 tablet, Refills: 5    gabapentin (NEURONTIN) 300 MG capsule Take 1 capsule (300 mg total) by mouth 3 (three) times daily. Qty: 90 capsule, Refills: 3    losartan (COZAAR) 100 MG tablet Take 1 tablet (100 mg total) by mouth daily. Qty: 90 tablet, Refills: 1    metoprolol tartrate (LOPRESSOR) 25 MG tablet Take 1 tablet (25 mg total) by mouth 2 (two) times daily. Qty: 60 tablet, Refills: 5    nitroGLYCERIN (NITROSTAT) 0.4 MG SL tablet Place 1 tablet (0.4 mg total) under the tongue every 5 (five) minutes as needed for chest pain. Qty: 25 tablet, Refills: 2    ranolazine (RANEXA) 500 MG 12 hr tablet Take 1 tablet (500 mg total) by mouth 2 (two) times daily. Qty: 180 tablet, Refills: 1    HYDROcodone-acetaminophen (NORCO) 7.5-325 MG tablet Take 1 tablet by mouth every 6 (six) hours as needed for moderate pain. Qty: 60 tablet, Refills: 0   Associated Diagnoses: HNP (herniated nucleus pulposus), lumbar           Outstanding Labs/Studies   none  Duration of Discharge Encounter   Greater than 30 minutes including physician time.  Melida QuitterSigned, Barrett, [facility]   11/16/2015, 12:00 PM

## 2015-11-16 NOTE — Progress Notes (Signed)
DAILY PROGRESS NOTE  Subjective:  Chest pain has resolved. Cardiac enzymes negative x 4 overnight.   Objective:  Temp:  [97.4 F (36.3 C)-98.1 F (36.7 C)] 97.5 F (36.4 C) (04/29 0241) Pulse Rate:  [62-81] 69 (04/29 0241) Resp:  [10-20] 17 (04/29 0241) BP: (102-140)/(72-86) 106/72 mmHg (04/29 0241) SpO2:  [95 %-99 %] 96 % (04/29 0241) Weight:  [268 lb (121.564 kg)-268 lb 9.6 oz (121.836 kg)] 268 lb 9.6 oz (121.836 kg) (04/28 2219) Weight change:   Intake/Output from previous day:    Intake/Output from this shift:    Medications: Current Facility-Administered Medications  Medication Dose Route Frequency Provider Last Rate Last Dose  . acetaminophen (TYLENOL) tablet 650 mg  650 mg Oral Q4H PRN Minus Breeding, MD      . atorvastatin (LIPITOR) tablet 80 mg  80 mg Oral q1800 Minus Breeding, MD      . clonazePAM [facility]) tablet 1 mg  1 mg Oral BID PRN Minus Breeding, MD      . clopidogrel (PLAVIX) tablet 75 mg  75 mg Oral q morning - 10a Minus Breeding, MD      . fenofibrate tablet 54 mg  54 mg Oral Daily Minus Breeding, MD      . gabapentin (NEURONTIN) capsule 300 mg  300 mg Oral TID Minus Breeding, MD   300 mg at 11/15/15 2239  . heparin injection 5,000 Units  5,000 Units Subcutaneous Q8H Minus Breeding, MD      . losartan (COZAAR) tablet 100 mg  100 mg Oral Daily Minus Breeding, MD      . metoprolol tartrate (LOPRESSOR) tablet 25 mg  25 mg Oral BID Minus Breeding, MD   25 mg at 11/15/15 2239  . nitroGLYCERIN (NITROGLYN) 2 % ointment 1 inch  1 inch Topical Q6H Minus Breeding, MD   1 inch at 11/15/15 2352  . ondansetron (ZOFRAN) injection 4 mg  4 mg Intravenous Q6H PRN Minus Breeding, MD      . ranolazine (RANEXA) 12 hr tablet 500 mg  500 mg Oral BID Minus Breeding, MD   500 mg at 11/15/15 2243    Physical Exam: General appearance: alert and no distress Lungs: clear to auscultation bilaterally Heart: regular rate and rhythm, S1, S2 normal, no murmur, click, rub or  gallop Extremities: extremities normal, atraumatic, no cyanosis or edema  Lab Results: Results for orders placed or performed during the hospital encounter of 11/15/15 (from the past 48 hour(s))  CBC     Status: None   Collection Time: 11/15/15  6:33 PM  Result Value Ref Range   WBC 7.3 4.0 - 10.5 K/uL   RBC 4.39 4.22 - 5.81 MIL/uL   Hemoglobin 13.6 13.0 - 17.0 g/dL   HCT 40.9 39.0 - 52.0 %   MCV 93.2 78.0 - 100.0 fL   MCH 31.0 26.0 - 34.0 pg   MCHC 33.3 30.0 - 36.0 g/dL   RDW 13.8 11.5 - 15.5 %   Platelets 252 150 - 400 K/uL  Comprehensive metabolic panel     Status: Abnormal   Collection Time: 11/15/15  6:33 PM  Result Value Ref Range   Sodium 141 135 - 145 mmol/L   Potassium 3.8 3.5 - 5.1 mmol/L   Chloride 109 101 - 111 mmol/L   CO2 22 22 - 32 mmol/L   Glucose, Bld 92 65 - 99 mg/dL   BUN 6 6 - 20 mg/dL   Creatinine, Ser 1.08 0.61 - 1.24 mg/dL  Calcium 9.1 8.9 - 10.3 mg/dL   Total Protein 6.4 (L) 6.5 - 8.1 g/dL   Albumin 3.8 3.5 - 5.0 g/dL   AST 21 15 - 41 U/L   ALT 21 17 - 63 U/L   Alkaline Phosphatase 43 38 - 126 U/L   Total Bilirubin 0.6 0.3 - 1.2 mg/dL   GFR calc non Af Amer >60 >60 mL/min   GFR calc Af Amer >60 >60 mL/min    Comment: (NOTE) The eGFR has been calculated using the CKD EPI equation. This calculation has not been validated in all clinical situations. eGFR's persistently <60 mL/min signify possible Chronic Kidney Disease.    Anion gap 10 5 - 15  Troponin I     Status: None   Collection Time: 11/15/15  6:33 PM  Result Value Ref Range   Troponin I <0.03 <0.031 ng/mL    Comment:        NO INDICATION OF MYOCARDIAL INJURY.   Brain natriuretic peptide (order if patient c/o SOB ONLY)     Status: None   Collection Time: 11/15/15  6:34 PM  Result Value Ref Range   B Natriuretic Peptide 21.4 0.0 - 100.0 pg/mL  Troponin I-serum (0, 3, 6 hours)     Status: None   Collection Time: 11/15/15 10:30 PM  Result Value Ref Range   Troponin I <0.03 <0.031  ng/mL    Comment:        NO INDICATION OF MYOCARDIAL INJURY.   CBC     Status: None   Collection Time: 11/15/15 10:30 PM  Result Value Ref Range   WBC 7.5 4.0 - 10.5 K/uL   RBC 4.37 4.22 - 5.81 MIL/uL   Hemoglobin 13.6 13.0 - 17.0 g/dL   HCT 40.7 39.0 - 52.0 %   MCV 93.1 78.0 - 100.0 fL   MCH 31.1 26.0 - 34.0 pg   MCHC 33.4 30.0 - 36.0 g/dL   RDW 13.8 11.5 - 15.5 %   Platelets 232 150 - 400 K/uL  Creatinine, serum     Status: None   Collection Time: 11/15/15 10:30 PM  Result Value Ref Range   Creatinine, Ser 1.21 0.61 - 1.24 mg/dL   GFR calc non Af Amer >60 >60 mL/min   GFR calc Af Amer >60 >60 mL/min    Comment: (NOTE) The eGFR has been calculated using the CKD EPI equation. This calculation has not been validated in all clinical situations. eGFR's persistently <60 mL/min signify possible Chronic Kidney Disease.   Troponin I-serum (0, 3, 6 hours)     Status: None   Collection Time: 11/16/15  2:16 AM  Result Value Ref Range   Troponin I <0.03 <0.031 ng/mL    Comment:        NO INDICATION OF MYOCARDIAL INJURY.   Troponin I-serum (0, 3, 6 hours)     Status: None   Collection Time: 11/16/15  4:35 AM  Result Value Ref Range   Troponin I <0.03 <0.031 ng/mL    Comment:        NO INDICATION OF MYOCARDIAL INJURY.     Imaging: Dg Chest 2 View  11/15/2015  CLINICAL DATA:  39-year-old male with history of headache after taking nitroglycerin a couple of hours ago. EXAM: CHEST  2 VIEW COMPARISON:  Chest x-ray 07/23/2014. FINDINGS: Lung volumes are normal. No consolidative airspace disease. No pleural effusions. No pneumothorax. No pulmonary nodule or mass noted. Pulmonary vasculature and the cardiomediastinal silhouette are within normal limits. Atherosclerosis   in the thoracic aorta. Status post median sternotomy for CABG. IMPRESSION: 1.  No radiographic evidence of acute cardiopulmonary disease. 2. Atherosclerosis. Electronically Signed   By: Vinnie Langton M.D.   On:  11/15/2015 18:11    Assessment:  1. Active Problems: 2.   Precordial chest pain 3.   Plan:  1. Ruled-out for ACS overnight. Per Dr. Rosezella [place] suggestions, will increase imdur to 60 mg daily (not 120, he was on imdur 30 mg daily at home). Will arrange for outpatient myoview and follow-up with Dr. Angelena Form.  [place] for d/c home today.  Time Spent Directly with Patient:  15 minutes  Length of Stay:    Pixie Casino, MD, [facility] Attending Cardiologist [place] 11/16/2015, 10:44 AM

## 2015-11-22 ENCOUNTER — Other Ambulatory Visit: Payer: Self-pay | Admitting: Family Medicine

## 2015-11-22 NOTE — Telephone Encounter (Signed)
ok 

## 2015-11-22 NOTE — Telephone Encounter (Signed)
Prescription sent to pharmacy.

## 2015-11-22 NOTE — Telephone Encounter (Signed)
Ok to refill 

## 2015-12-01 ENCOUNTER — Other Ambulatory Visit: Payer: Self-pay | Admitting: Family Medicine

## 2015-12-13 ENCOUNTER — Encounter: Payer: Medicare Other | Admitting: Cardiovascular Disease

## 2016-01-04 IMAGING — CT CT ABD-PELV W/ CM
2 of 4 series · 17 of 46 positions shown, 19 images · IV contrast (APPLIED)
Comparison: 01/02/2011

CLINICAL DATA: Right lower abdominal pain

EXAM:
CT ABDOMEN AND PELVIS WITH CONTRAST
TECHNIQUE: Multidetector CT imaging of the abdomen and pelvis was performed
using the standard protocol following bolus administration of
intravenous contrast.
CONTRAST:  100mL OMNIPAQUE IOHEXOL 300 MG/ML  SOLN

[Series 2: abd/ pelvis 5.0 i30f 1 · axial · 0.96mm/px · z∈[+1003,+1478]mm · 14 of 105 slices shown, 16 images]
[im 5/105  soft-tissue]
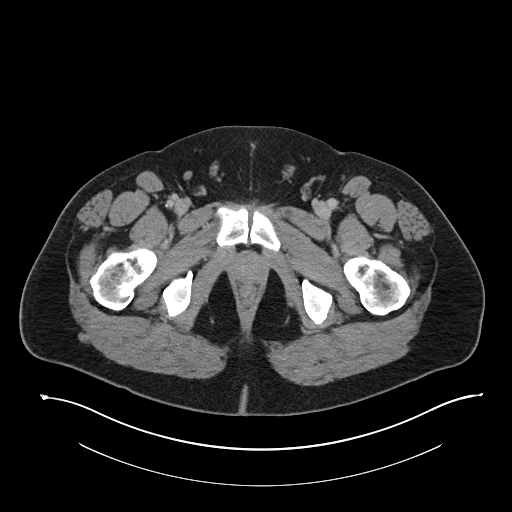
[im 5/105  bone]
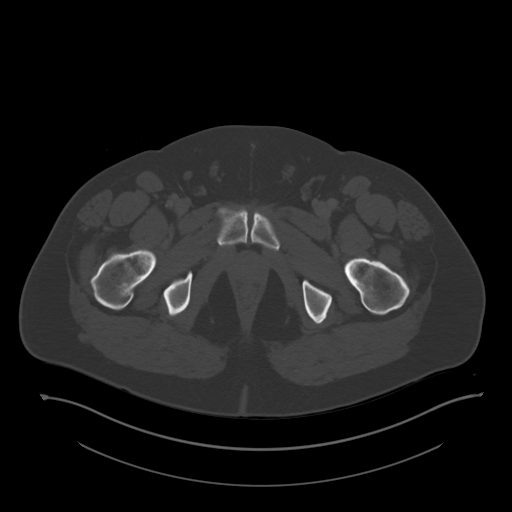
[im 14/105  soft-tissue]
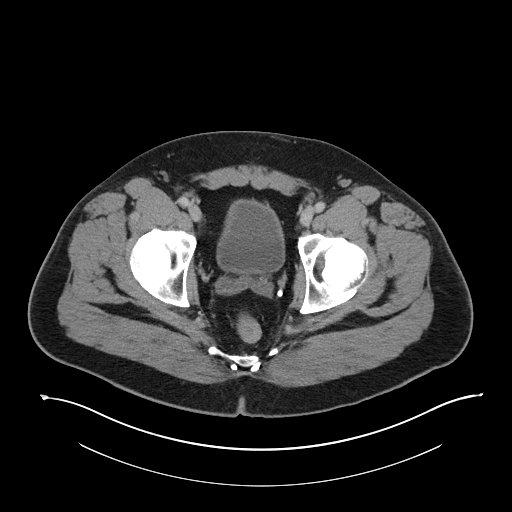
[im 22/105  soft-tissue]
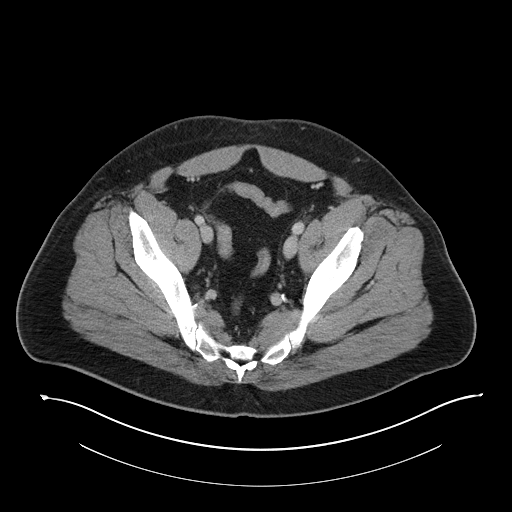
[im 27/105  soft-tissue]
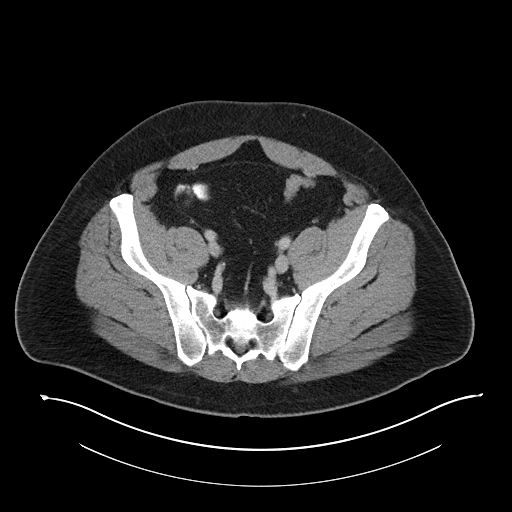
[im 35/105  soft-tissue]
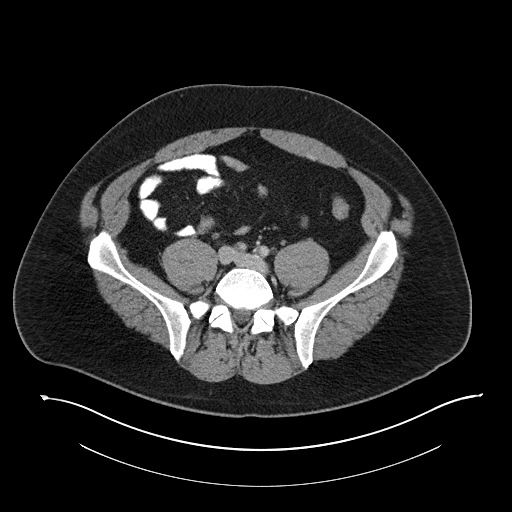
[im 44/105  soft-tissue]
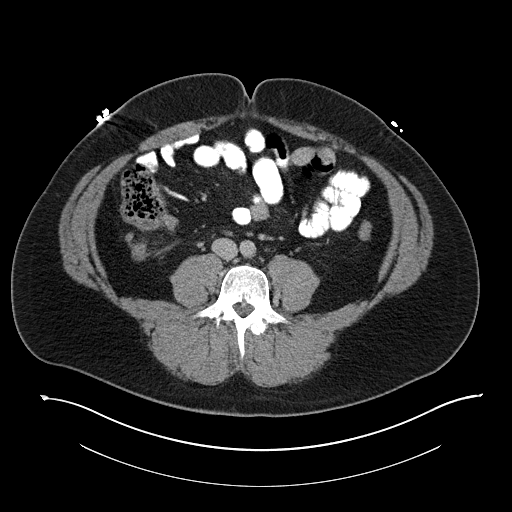
[im 48/105  soft-tissue]
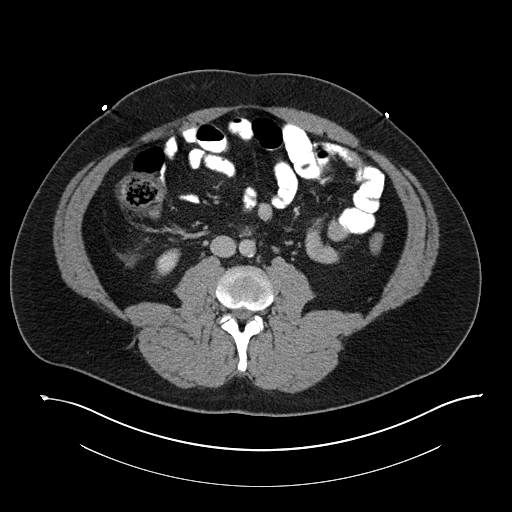
[im 57/105  soft-tissue]
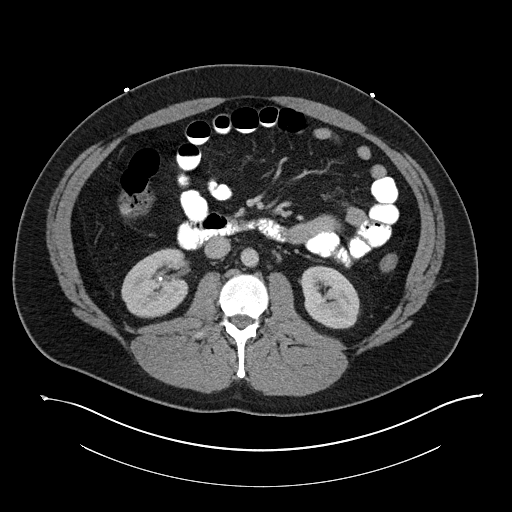
[im 61/105  soft-tissue]
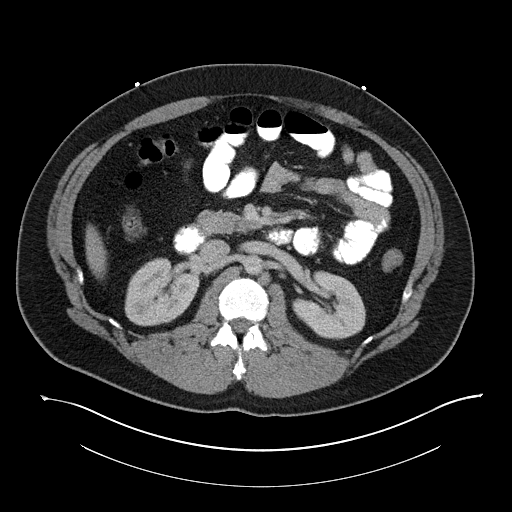
[im 61/105  bone]
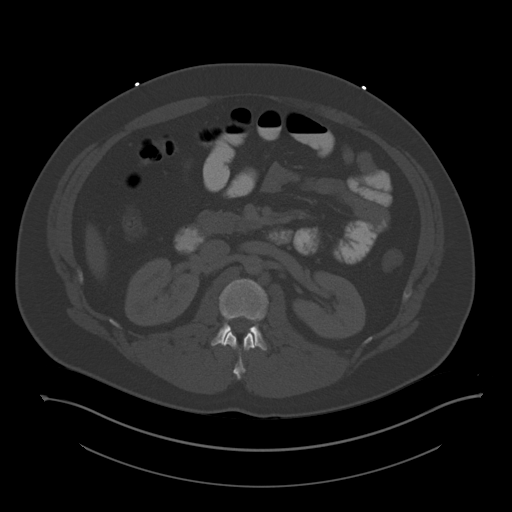
[im 70/105  soft-tissue]
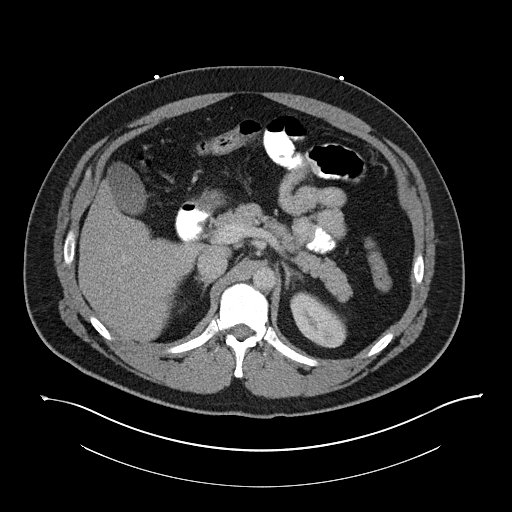
[im 79/105  soft-tissue]
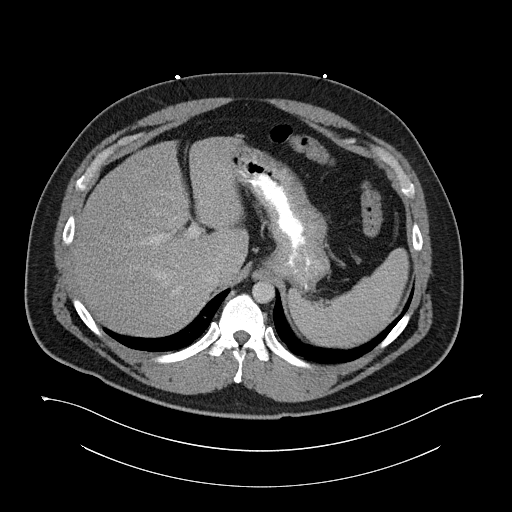
[im 83/105  soft-tissue]
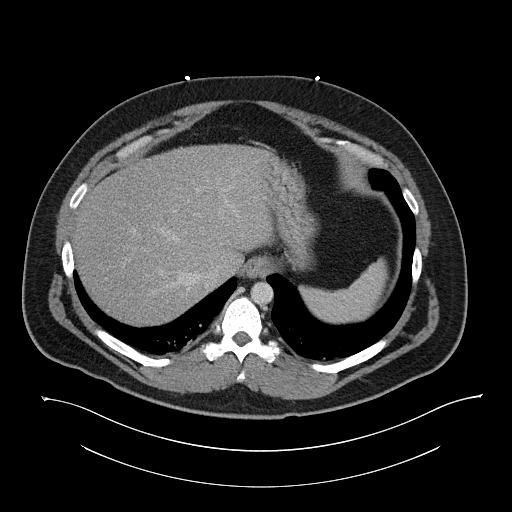
[im 92/105  soft-tissue]
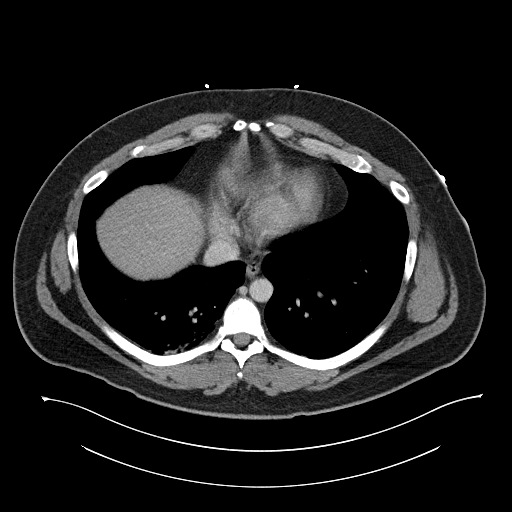
[im 100/105  soft-tissue]
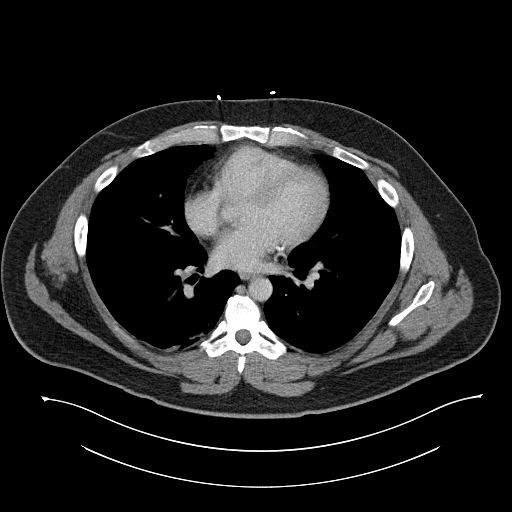

[Series 5: coronal soft tissue · coronal · 0.99mm/px · 3 of 98 slices shown]
[im 33/98  soft-tissue]
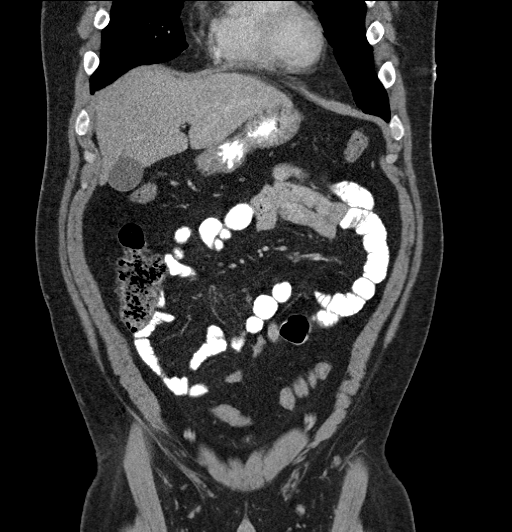
[im 44/98  soft-tissue]
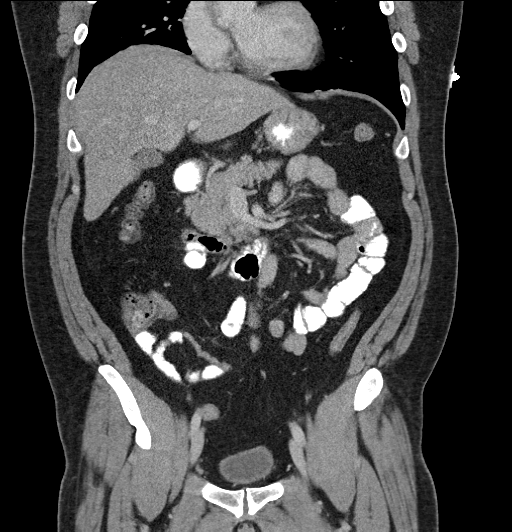
[im 54/98  soft-tissue]
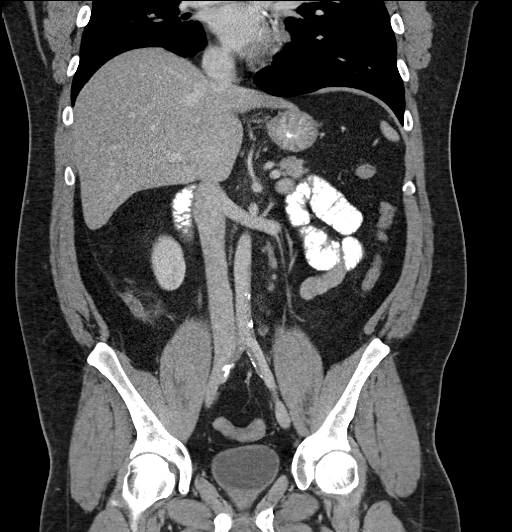

[17 of 46 positions shown; findings below may reference images not displayed]

FINDINGS: Previous median sternotomy. Subpleural scar or subsegmental
atelectasis posteriorly in the visualized lung bases. Unremarkable
liver, nondilated gallbladder, spleen, adrenal glands, pancreas.
Bilateral nephrolithiasis, largest stone a 4 mm calculus in the
lower pole right renal collecting system. Renal parenchyma
unremarkable. No hydronephrosis or ureterectasis. No ureteral
calculus. Urinary bladder is incompletely distended.

Stomach, small bowel, and colon are nondilated. There are
inflammatory/ edematous changes around the appendix which is dilated
up to 10 mm diameter and retrocecal. No evidence perforation or
abscess. Bilateral pelvic vascular calcifications. No ascites. No
free air. Patchy aortoiliac arterial calcifications without
aneurysm.
IMPRESSION: 1. Probable early acute appendicitis without perforation or abscess.
Critical Value/emergent results were called by telephone at the time
of interpretation on 09/17/2013 at [DATE] to Dr. BERNARD KWESI KRISTODEA ,
who verbally acknowledged these results.
2. Bilateral nephrolithiasis.

## 2016-01-04 IMAGING — CR DG CHEST 2V
2 series · 2 of 2 positions shown · non-contrast
Comparison: 12/05/2012

CLINICAL DATA: Left chest pain

EXAM:
CHEST - 2 VIEW

[w chest pa]
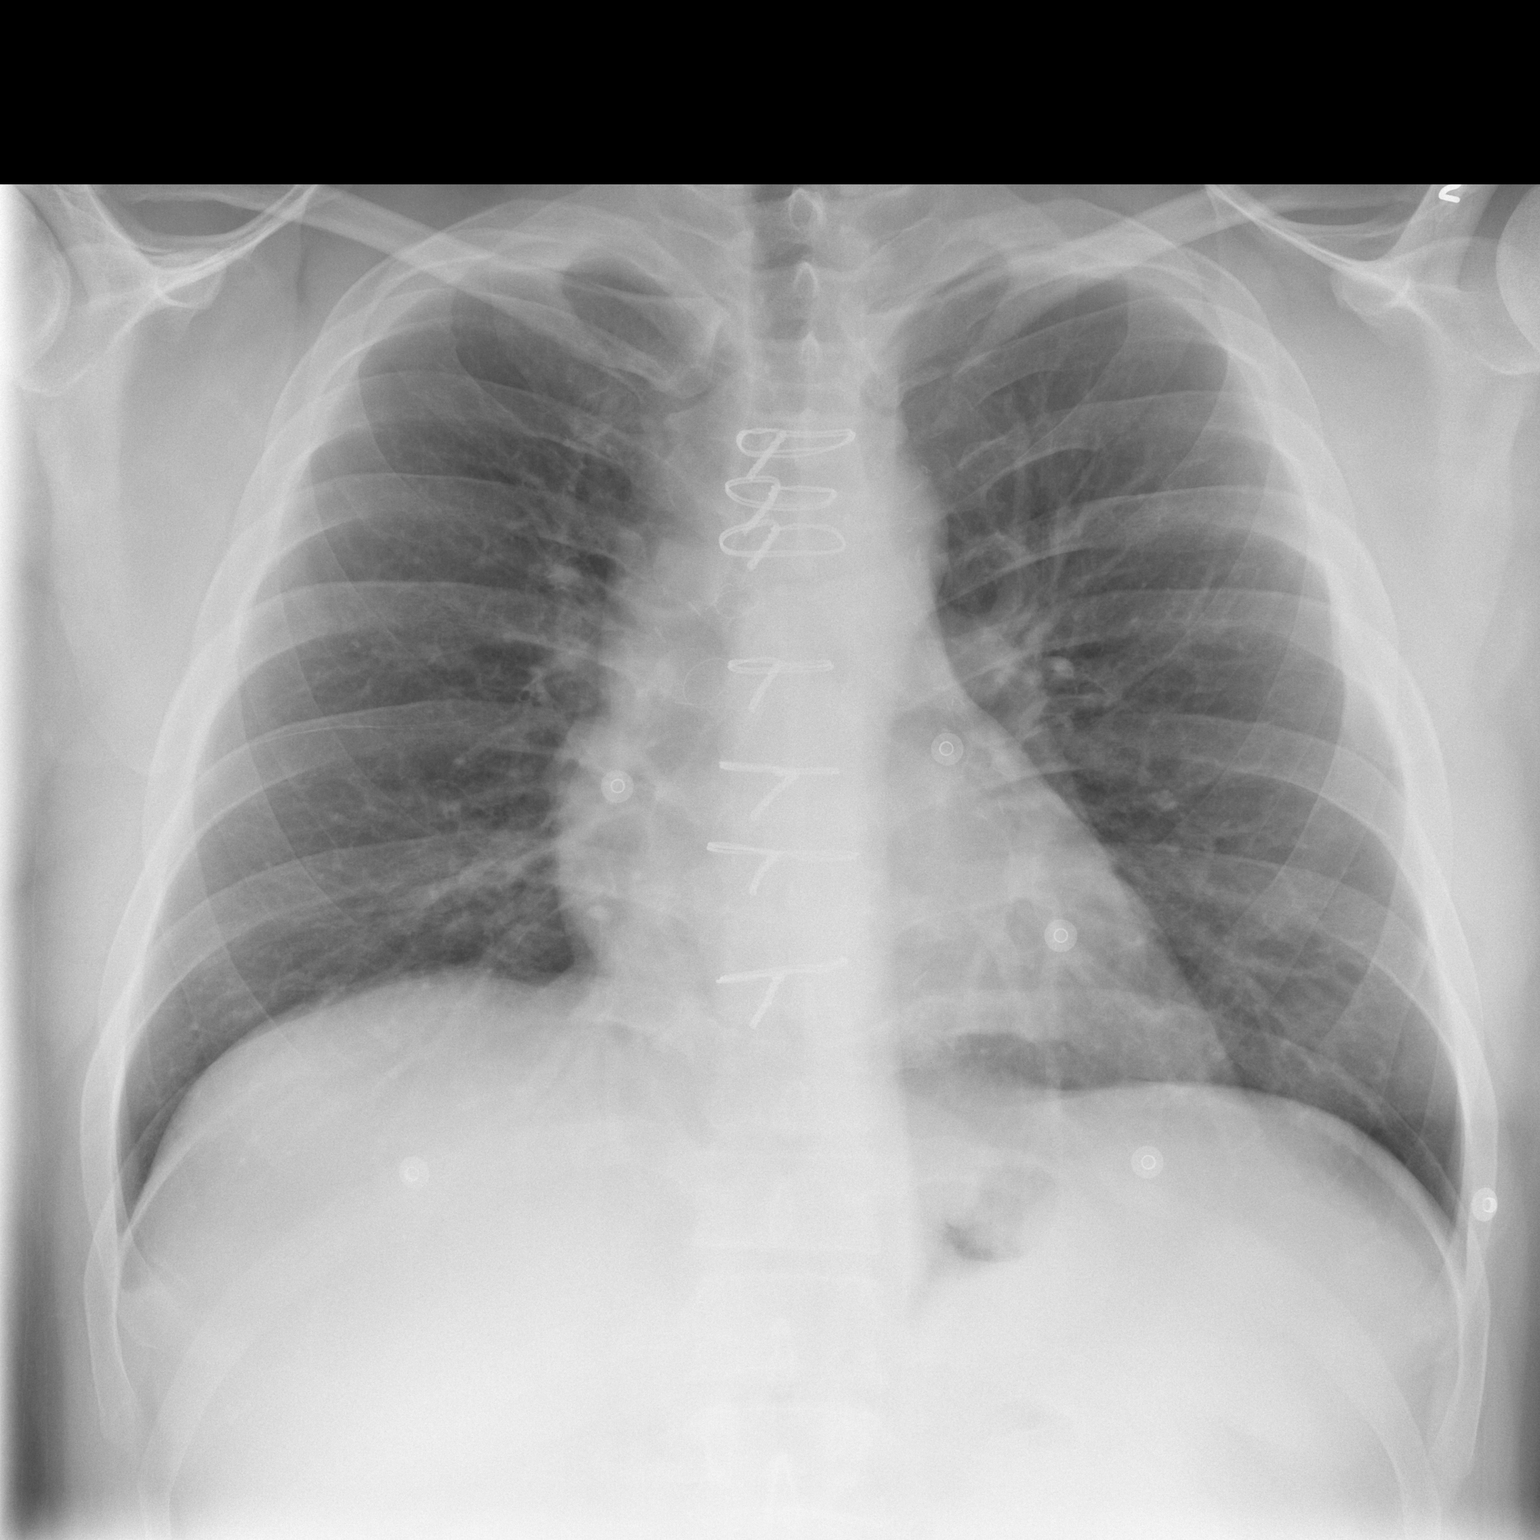

[w chest lat]
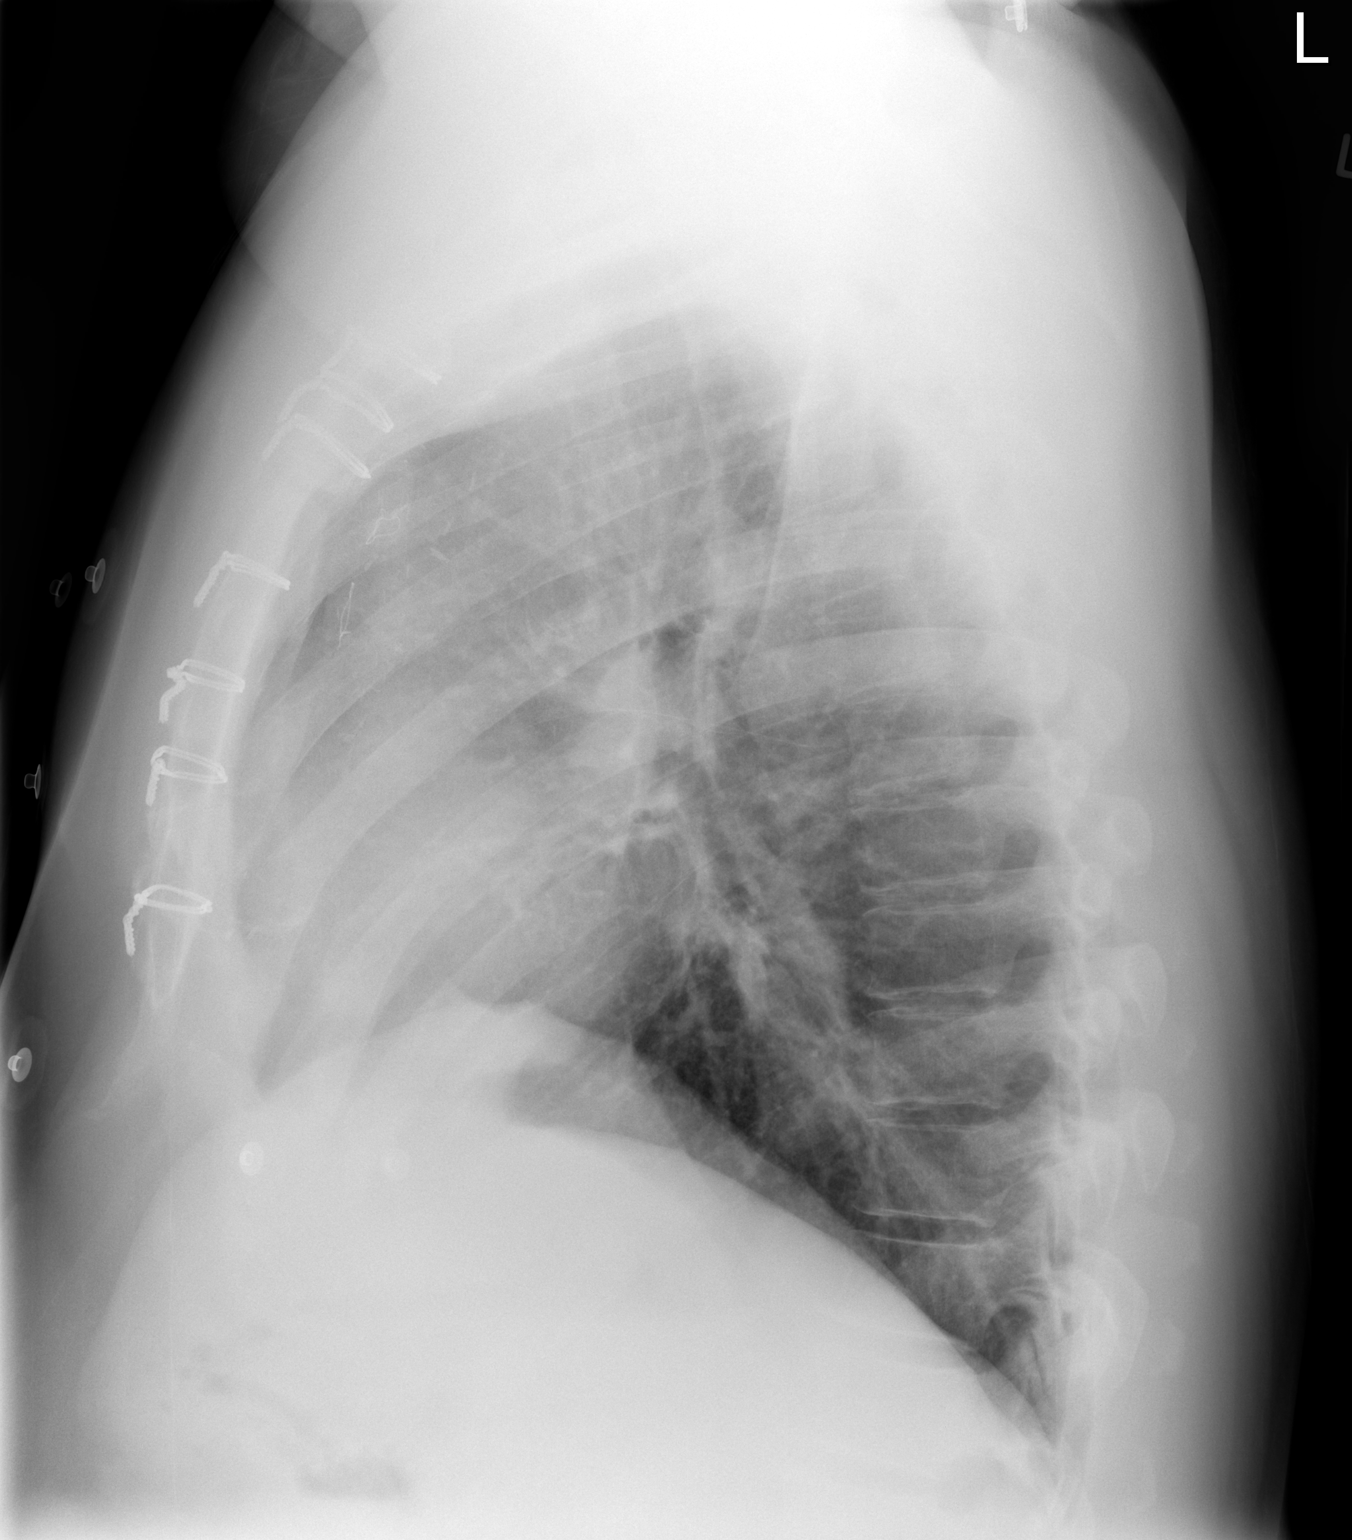

[2 of 2 positions shown; findings below may reference images not displayed]

FINDINGS: Lungs are clear. Heart size and mediastinal contours are within
normal limits.
No effusion.
Previous CABG.
IMPRESSION: No acute disease post CABG

## 2016-01-27 ENCOUNTER — Other Ambulatory Visit: Payer: Self-pay | Admitting: Family Medicine

## 2016-01-27 NOTE — Telephone Encounter (Signed)
Refill appropriate and filled per protocol. 

## 2016-01-31 ENCOUNTER — Other Ambulatory Visit: Payer: Self-pay | Admitting: Family Medicine

## 2016-01-31 NOTE — Telephone Encounter (Signed)
ok 

## 2016-01-31 NOTE — Telephone Encounter (Signed)
Ok to refill??  Last office visit 11/15/2015.  Last refill 10/28/2015, #2 refills.

## 2016-01-31 NOTE — Telephone Encounter (Signed)
Medication called to pharmacy. 

## 2016-02-11 ENCOUNTER — Telehealth: Payer: Self-pay | Admitting: Family Medicine

## 2016-02-11 DIAGNOSIS — M5126 Other intervertebral disc displacement, lumbar region: Secondary | ICD-10-CM

## 2016-02-11 MED ORDER — HYDROCODONE-ACETAMINOPHEN 7.5-325 MG PO TABS: 1.0000 | ORAL_TABLET | Freq: Four times a day (QID) | ORAL | 0 refills | Status: DC | PRN

## 2016-02-11 NOTE — Telephone Encounter (Signed)
Ok to refill 

## 2016-02-11 NOTE — Telephone Encounter (Signed)
ok 

## 2016-02-11 NOTE — Telephone Encounter (Signed)
RX printed, left up front and patient aware to pick up in the am 

## 2016-02-11 NOTE — Telephone Encounter (Signed)
Patient calling to get rx for hydrocodone  [phone]

## 2016-02-15 ENCOUNTER — Other Ambulatory Visit: Payer: Self-pay | Admitting: Family Medicine

## 2016-02-17 NOTE — Telephone Encounter (Signed)
Ok to refill 

## 2016-02-17 NOTE — Telephone Encounter (Signed)
Prescription sent to pharmacy.

## 2016-02-17 NOTE — Telephone Encounter (Signed)
ok 

## 2016-02-26 ENCOUNTER — Encounter: Payer: Self-pay | Admitting: Cardiovascular Disease

## 2016-03-12 ENCOUNTER — Ambulatory Visit (INDEPENDENT_AMBULATORY_CARE_PROVIDER_SITE_OTHER): Payer: Medicare Other | Admitting: Cardiovascular Disease

## 2016-03-12 ENCOUNTER — Encounter: Payer: Self-pay | Admitting: Cardiovascular Disease

## 2016-03-12 VITALS — BP 124/84 | HR 74 | Ht 72.0 in | Wt 281.6 lb

## 2016-03-12 DIAGNOSIS — E785 Hyperlipidemia, unspecified: Secondary | ICD-10-CM

## 2016-03-12 DIAGNOSIS — I249 Acute ischemic heart disease, unspecified: Secondary | ICD-10-CM

## 2016-03-12 DIAGNOSIS — I10 Essential (primary) hypertension: Secondary | ICD-10-CM

## 2016-03-12 DIAGNOSIS — I2510 Atherosclerotic heart disease of native coronary artery without angina pectoris: Secondary | ICD-10-CM | POA: Diagnosis not present

## 2016-03-12 DIAGNOSIS — Z720 Tobacco use: Secondary | ICD-10-CM | POA: Diagnosis not present

## 2016-03-12 NOTE — Patient Instructions (Signed)

## 2016-03-12 NOTE — Progress Notes (Signed)
Chief Complaint  Patient presents with  . Coronary Artery Disease    History of Present Illness: 40 year old male with history of CAD with multiple PCI and a 4-vessel CABG in 2009, HTN, HLD, tobacco abuse who is here today for cardiac follow up. He had been followed by Dr. Riley KillStuckey. His coronary history began in 2009 with an inferior MI requiring RCA PCI and subsequent CABG. He then presented in 2010 with an NSTEMI in 2010 requiring PCI of the Circumflex. Cardiac cath on 10/19/11 with stable CAD, bypasses. He was discharged home on Imdur and Ranexa.  Admitted overnight 11/15/15 to [facility] with chest pain. Negative enzymes.    He is here today for cardiac follow up. He has been feeling well. He has his typical chest pain when he exerts himself but this is unchanged over last five years. Breathing is at baseline. He is smoking 3-4 cigarettes per day.    Primary Care Physician: Leo GrosserPICKARD,WARREN TOM, MD   Past Medical History:  Diagnosis Date  . Anxiety   . Arthritis   . CHF (congestive heart failure) (HCC)   . Complication of anesthesia    difficulty awakening following appendectomy   . Dizziness    with humidity   . Hx of CABG   . Hyperlipidemia   . Hypertension   . Kidney stones   . MYOCARDIAL INFARCTION 10/13/2008   Qualifier: Diagnosis of  By: Juanda ChanceBrodie, MD, Johny ChessFACC, Bruce Rogers   . Numbness in right leg    pt relates to sciatica  . Tobacco user     Past Surgical History:  Procedure Laterality Date  . APPENDECTOMY    . CARDIAC CATHETERIZATION    . CORONARY ARTERY BYPASS GRAFT     status post diaphragmatic wall infarction Rx BMS RCA 03-11-08   . CORONARY STENT PLACEMENT    . CYSTOSCOPY WITH RETROGRADE PYELOGRAM, URETEROSCOPY AND STENT PLACEMENT Bilateral 03/16/2015   Procedure: CYSTOSCOPY WITH BILATERAL RETROGRADE PYELOGRAM, RIGHT URETEROSCOPY, STONE BASKETRY WITH LASER LITHOTRIPSY ON THE RIGHT,  AND BILATERAL STENT PLACEMENT;  Surgeon: Malen GauzePatrick L McKenzie, MD;  Location: [facility];  Service:  Urology;  Laterality: Bilateral;  . CYSTOSCOPY WITH RETROGRADE PYELOGRAM, URETEROSCOPY AND STENT PLACEMENT Left 04/02/2015   Procedure: CYSTOSCOPY WITH RETROGRADE PYELOGRAM, URETEROSCOPY REMOVAL BILATERAL STENTS;  Surgeon: Malen GauzePatrick L McKenzie, MD;  Location: [facility];  Service: Urology;  Laterality: Left;  . HOLMIUM LASER APPLICATION Right 03/16/2015   Procedure: HOLMIUM LASER APPLICATION;  Surgeon: Malen GauzePatrick L McKenzie, MD;  Location: [facility];  Service: Urology;  Laterality: Right;  . LAPAROSCOPIC APPENDECTOMY N/A 09/17/2013   Procedure: APPENDECTOMY LAPAROSCOPIC;  Surgeon: Shelly Rubensteinouglas A Blackman, MD;  Location: [facility];  Service: General;  Laterality: N/A;  . LEFT HEART CATHETERIZATION WITH CORONARY/GRAFT ANGIOGRAM N/A 10/19/2011   Procedure: LEFT HEART CATHETERIZATION WITH Isabel CapriceORONARY/GRAFT ANGIOGRAM;  Surgeon: Herby Abrahamhomas D Stuckey, MD;  [facility];  Service: Cardiovascular;  Laterality: N/A;  . ORTHOPEDIC SURGERY    . TONSILLECTOMY      Current Outpatient Prescriptions  Medication Sig Dispense Refill  . atorvastatin (LIPITOR) 80 MG tablet TAKE ONE TABLET BY MOUTH ONCE DAILY AT  6  PM 30 tablet 5  . baclofen (LIORESAL) 10 MG tablet TAKE ONE TABLET BY MOUTH THREE TIMES DAILY 90 tablet 0  . clonazePAM (KLONOPIN) 1 MG tablet TAKE ONE-HALF TO ONE TABLET BY MOUTH TWICE DAILY AS NEEDED FOR ANXIETY 60 tablet 2  . clopidogrel (PLAVIX) 75 MG tablet Take 1 tablet (75 mg total) by mouth every  morning. 30 tablet 5  . fenofibrate (TRICOR) 48 MG tablet Take 1 tablet (48 mg total) by mouth daily. 30 tablet 5  . gabapentin (NEURONTIN) 300 MG capsule TAKE ONE CAPSULE BY MOUTH THREE TIMES DAILY 90 capsule 5  . HYDROcodone-acetaminophen (NORCO) 7.5-325 MG tablet Take 1 tablet by mouth every 6 (six) hours as needed for moderate pain.    . isosorbide mononitrate (IMDUR) 60 MG 24 hr tablet Take 1 tablet (60 mg total) by mouth daily. 30 tablet 11  . losartan (COZAAR) 100 MG tablet Take 1 tablet (100 mg total) by mouth daily.  90 tablet 1  . metoprolol tartrate (LOPRESSOR) 25 MG tablet Take 1 tablet (25 mg total) by mouth 2 (two) times daily. 60 tablet 5  . Multiple Vitamin (MULTI-VITAMIN DAILY PO) Take 1 Dose by mouth daily. [employer] vitamin pack takes 1 packet by mouth daily    . nitroGLYCERIN (NITROSTAT) 0.4 MG SL tablet Place 1 tablet (0.4 mg total) under the tongue every 5 (five) minutes as needed for chest pain. 25 tablet 2  . RANEXA 500 MG 12 hr tablet TAKE ONE TABLET BY MOUTH TWICE DAILY 180 tablet 0   No current facility-administered medications for this visit.     Allergies  Allergen Reactions  . Celexa [Citalopram] Itching and Rash  . Codeine Itching    Social History   Social History  . Marital status: Divorced    Spouse name: N/A  . Number of children: 4  . Years of education: N/A   Occupational History  . Not on file.   Social History Main Topics  . Smoking status: Current Every Day Smoker    Packs/day: 0.50    Years: 30.00    Types: Cigarettes  . Smokeless tobacco: Former NeurosurgeonUser    Types: Snuff    Quit date: 07/20/1994  . Alcohol use 0.0 oz/week     Comment: may have a drink every few months  . Drug use: No     Comment: former   . Sexual activity: Not Currently   Other Topics Concern  . Not on file   Social History Narrative   Lives with girlfriend and two children.  He has four children.      Family History  Problem Relation Age of Onset  . Adopted: Yes  . Cancer Mother   . Diabetes Mother   . Hyperlipidemia Mother   . Hypertension Mother   . Heart failure Mother     Died age 40  . Cancer Sister   . Diabetes Sister   . Hyperlipidemia Sister   . Hypertension Sister     Review of Systems:  As stated in the HPI and otherwise negative.   BP 124/84   Pulse 74   Ht 6' (1.829 m)   Wt 281 lb 9.6 oz (127.7 kg)   SpO2 97%   BMI 38.19 kg/m   Physical Examination: General: Well developed, well nourished, NAD  HEENT: OP clear, mucus membranes moist  SKIN: warm, dry. No  rashes. Neuro: No focal deficits  Musculoskeletal: Muscle strength 5/5 all ext  Psychiatric: Mood and affect normal  Neck: No JVD, no carotid bruits, no thyromegaly, no lymphadenopathy.  Lungs:Clear bilaterally, no wheezes, rhonci, crackles Cardiovascular: Regular rate and rhythm. No murmurs, gallops or rubs. Abdomen:Soft. Bowel sounds present. Non-tender.  Extremities: No lower extremity edema. Pulses are 2 + in the bilateral DP/PT.  EKG:  EKG is not ordered today. The ekg ordered today demonstrates   Recent Labs:  11/15/2015: ALT 21; B Natriuretic Peptide 21.4; BUN 6; Creatinine, Ser 1.21; Hemoglobin 13.6; Platelets 232; Potassium 3.8; Sodium 141   Lipid Panel    Component Value Date/Time   CHOL 174 05/08/2015 0813   TRIG 289 (H) 05/08/2015 0813   HDL 40 05/08/2015 0813   CHOLHDL 4.4 05/08/2015 0813   VLDL 58 (H) 05/08/2015 0813   LDLCALC 76 05/08/2015 0813   LDLDIRECT 103.2 06/15/2012 1446     Wt Readings from Last 3 Encounters:  03/12/16 281 lb 9.6 oz (127.7 kg)  11/15/15 268 lb 9.6 oz (121.8 kg)  11/15/15 268 lb (121.6 kg)     Other studies Reviewed: Additional studies/ records that were reviewed today include: . Review of the above records demonstrates:    Assessment and Plan:   1. CAD: Stable with no recent change in chest pains. He has chronic stable angina. Cath April 2013 with stable severe disease s/p CABG. Will continue Plavix, statin, beta blocker, ARB, Ranexa and Imdur. He does not tolerate ASA due to GI upset.   2. HYPERLIPIDEMIA/Hypertriglyceridemia. Continue statin and Tricor. Lipids well controlled.   3. TOBACCO USE: He is still smoking but just smoking several cigarettes per day. Smoking cessation counseling today.   4. HTN: BP well controlled. No changes.    Current medicines are reviewed at length with the patient today.  The patient does not have concerns regarding medicines.  The following changes have been made:  no change  Labs/ tests  ordered today include:   No orders of the defined types were placed in this encounter.   Disposition:   FU with me in 6 months  Signed, Verne Carrow, MD 03/12/2016 1:53 PM    [facility] [address] [place], [place], [place]  [address] Phone: [phone]; Fax: [phone]

## 2016-03-19 ENCOUNTER — Other Ambulatory Visit: Payer: Self-pay | Admitting: Family Medicine

## 2016-04-12 ENCOUNTER — Other Ambulatory Visit: Payer: Self-pay | Admitting: Family Medicine

## 2016-04-17 ENCOUNTER — Other Ambulatory Visit: Payer: Self-pay | Admitting: Family Medicine

## 2016-04-17 NOTE — Telephone Encounter (Signed)
ok 

## 2016-04-17 NOTE — Telephone Encounter (Signed)
Ok to refill 

## 2016-04-28 ENCOUNTER — Ambulatory Visit: Payer: Medicare Other | Admitting: Family Medicine

## 2016-05-05 ENCOUNTER — Other Ambulatory Visit: Payer: Self-pay | Admitting: Family Medicine

## 2016-05-05 NOTE — Telephone Encounter (Signed)
ok 

## 2016-05-05 NOTE — Telephone Encounter (Signed)
Ok to refill 

## 2016-05-12 ENCOUNTER — Ambulatory Visit (INDEPENDENT_AMBULATORY_CARE_PROVIDER_SITE_OTHER): Payer: Medicare Other | Admitting: Family Medicine

## 2016-05-12 ENCOUNTER — Encounter: Payer: Self-pay | Admitting: Family Medicine

## 2016-05-12 ENCOUNTER — Other Ambulatory Visit: Payer: Self-pay | Admitting: Family Medicine

## 2016-05-12 VITALS — BP 110/82 | HR 76 | Temp 97.8°F | Resp 16 | Ht 72.0 in | Wt 282.0 lb

## 2016-05-12 DIAGNOSIS — G8929 Other chronic pain: Secondary | ICD-10-CM | POA: Diagnosis not present

## 2016-05-12 DIAGNOSIS — M545 Low back pain: Secondary | ICD-10-CM | POA: Diagnosis not present

## 2016-05-12 DIAGNOSIS — I2510 Atherosclerotic heart disease of native coronary artery without angina pectoris: Secondary | ICD-10-CM

## 2016-05-12 LAB — CBC WITH DIFFERENTIAL/PLATELET
BASOS ABS: 0 {cells}/uL (ref 0–200)
Basophils Relative: 0 %
EOS ABS: 225 {cells}/uL (ref 15–500)
EOS PCT: 3 %
HCT: 45.5 % (ref 38.5–50.0)
HEMOGLOBIN: 15.4 g/dL (ref 13.0–17.0)
LYMPHS ABS: 2475 {cells}/uL (ref 850–3900)
Lymphocytes Relative: 33 %
MCH: 32.2 pg (ref 27.0–33.0)
MCHC: 33.8 g/dL (ref 32.0–36.0)
MCV: 95 fL (ref 80.0–100.0)
MPV: 10.2 fL (ref 7.5–12.5)
Monocytes Absolute: 450 cells/uL (ref 200–950)
Monocytes Relative: 6 %
NEUTROS PCT: 58 %
Neutro Abs: 4350 cells/uL (ref 1500–7800)
Platelets: 294 10*3/uL (ref 140–400)
RBC: 4.79 MIL/uL (ref 4.20–5.80)
RDW: 14.3 % (ref 11.0–15.0)
WBC: 7.5 10*3/uL (ref 3.8–10.8)

## 2016-05-12 LAB — COMPLETE METABOLIC PANEL WITH GFR
ALBUMIN: 4.6 g/dL (ref 3.6–5.1)
ALK PHOS: 49 U/L (ref 40–115)
ALT: 24 U/L (ref 9–46)
AST: 20 U/L (ref 10–40)
BILIRUBIN TOTAL: 0.7 mg/dL (ref 0.2–1.2)
BUN: 9 mg/dL (ref 7–25)
CALCIUM: 9.7 mg/dL (ref 8.6–10.3)
CO2: 22 mmol/L (ref 20–31)
Chloride: 105 mmol/L (ref 98–110)
Creat: 1.06 mg/dL (ref 0.60–1.35)
GFR, EST NON AFRICAN AMERICAN: 87 mL/min (ref >=60)
Glucose, Bld: 102 mg/dL — ABNORMAL HIGH (ref 70–99)
POTASSIUM: 4.4 mmol/L (ref 3.5–5.3)
SODIUM: 138 mmol/L (ref 135–146)
TOTAL PROTEIN: 7.2 g/dL (ref 6.1–8.1)

## 2016-05-12 LAB — LIPID PANEL
CHOL/HDL RATIO: 4.5 ratio (ref <=5.0)
CHOLESTEROL: 188 mg/dL (ref 125–200)
HDL: 42 mg/dL (ref >=40)
LDL Cholesterol: 79 mg/dL (ref <130)
TRIGLYCERIDES: 334 mg/dL — AB (ref <150)
VLDL: 67 mg/dL — ABNORMAL HIGH (ref <30)

## 2016-05-12 NOTE — Progress Notes (Signed)
Subjective:    Patient ID: [name], male    DOB: 01-15-1976, 40 y.o.   MRN: [id number]  HPI Patient presents today For follow-up. He is scheduled to see his cardiologist is going to perform a stress test later this month according to the patient. He denies any chest pain or shortness of breath. His blood pressure today is well controlled. He is overdue for fasting lipid panel to check his LDL cholesterol. He is taking Lipitor and fenofibrate. He denies any myalgias or right upper quadrant pain. He does have a past medical history of herniated disc in his back with chronic midline low back pain. He uses Percocet 1-2 times a day when his pain is severe. It tends to only occur during cold weather. For several months of the year he does not even use the medication. There is no evidence of abuse or diversion. He consents today to a urine drug test to ensure compliance. He refuses a flu shot Past Medical History:  Diagnosis Date  . Anxiety   . Arthritis   . CHF (congestive heart failure) (HCC)   . Complication of anesthesia    difficulty awakening following appendectomy   . Dizziness    with humidity   . Hx of CABG   . Hyperlipidemia   . Hypertension   . Kidney stones   . MYOCARDIAL INFARCTION 10/13/2008   Qualifier: Diagnosis of  By: Juanda Chance, MD, Johny Chess   . Numbness in right leg    pt relates to sciatica  . Tobacco user    Past Surgical History:  Procedure Laterality Date  . APPENDECTOMY    . CARDIAC CATHETERIZATION    . CORONARY ARTERY BYPASS GRAFT     status post diaphragmatic wall infarction Rx BMS RCA 03-11-08   . CORONARY STENT PLACEMENT    . CYSTOSCOPY WITH RETROGRADE PYELOGRAM, URETEROSCOPY AND STENT PLACEMENT Bilateral 03/16/2015   Procedure: CYSTOSCOPY WITH BILATERAL RETROGRADE PYELOGRAM, RIGHT URETEROSCOPY, STONE BASKETRY WITH LASER LITHOTRIPSY ON THE RIGHT,  AND BILATERAL STENT PLACEMENT;  Surgeon: Malen Gauze, MD;  Location: [facility];  Service: Urology;   Laterality: Bilateral;  . CYSTOSCOPY WITH RETROGRADE PYELOGRAM, URETEROSCOPY AND STENT PLACEMENT Left 04/02/2015   Procedure: CYSTOSCOPY WITH RETROGRADE PYELOGRAM, URETEROSCOPY REMOVAL BILATERAL STENTS;  Surgeon: Malen Gauze, MD;  Location: [facility];  Service: Urology;  Laterality: Left;  . HOLMIUM LASER APPLICATION Right 03/16/2015   Procedure: HOLMIUM LASER APPLICATION;  Surgeon: Malen Gauze, MD;  Location: [facility];  Service: Urology;  Laterality: Right;  . LAPAROSCOPIC APPENDECTOMY N/A 09/17/2013   Procedure: APPENDECTOMY LAPAROSCOPIC;  Surgeon: Shelly Rubenstein, MD;  Location: [facility];  Service: General;  Laterality: N/A;  . LEFT HEART CATHETERIZATION WITH CORONARY/GRAFT ANGIOGRAM N/A 10/19/2011   Procedure: LEFT HEART CATHETERIZATION WITH Isabel Caprice;  Surgeon: Herby Abraham, MD;  [facility];  Service: Cardiovascular;  Laterality: N/A;  . ORTHOPEDIC SURGERY    . TONSILLECTOMY     Current Outpatient Prescriptions on File Prior to Visit  Medication Sig Dispense Refill  . atorvastatin (LIPITOR) 80 MG tablet TAKE ONE TABLET BY MOUTH ONCE DAILY AT  6  PM 30 tablet 5  . baclofen (LIORESAL) 10 MG tablet TAKE ONE TABLET BY MOUTH THREE TIMES DAILY 90 tablet 0  . clonazePAM (KLONOPIN) 1 MG tablet TAKE ONE-HALF TO ONE TABLET BY MOUTH TWICE DAILY AS NEEDED FOR ANXIETY 60 tablet 2  . clopidogrel (PLAVIX) 75 MG tablet TAKE ONE TABLET BY MOUTH IN  THE MORNING 30 tablet 5  . fenofibrate (TRICOR) 48 MG tablet TAKE ONE TABLET BY MOUTH ONCE DAILY 30 tablet 5  . gabapentin (NEURONTIN) 300 MG capsule TAKE ONE CAPSULE BY MOUTH THREE TIMES DAILY 90 capsule 5  . HYDROcodone-acetaminophen (NORCO) 7.5-325 MG tablet Take 1 tablet by mouth every 6 (six) hours as needed for moderate pain.    . isosorbide mononitrate (IMDUR) 30 MG 24 hr tablet TAKE ONE TABLET BY MOUTH ONCE DAILY 30 tablet 5  . isosorbide mononitrate (IMDUR) 60 MG 24 hr tablet Take 1 tablet (60 mg total) by mouth daily.  30 tablet 11  . losartan (COZAAR) 100 MG tablet TAKE ONE TABLET BY MOUTH ONCE DAILY 90 tablet 1  . metoprolol tartrate (LOPRESSOR) 25 MG tablet TAKE ONE TABLET BY MOUTH TWICE DAILY 60 tablet 5  . Multiple Vitamin (MULTI-VITAMIN DAILY PO) Take 1 Dose by mouth daily. [employer] vitamin pack takes 1 packet by mouth daily    . nitroGLYCERIN (NITROSTAT) 0.4 MG SL tablet Place 1 tablet (0.4 mg total) under the tongue every 5 (five) minutes as needed for chest pain. 25 tablet 2  . RANEXA 500 MG 12 hr tablet TAKE ONE TABLET BY MOUTH TWICE DAILY 180 tablet 0   No current facility-administered medications on file prior to visit.    Allergies  Allergen Reactions  . Celexa [Citalopram] Itching and Rash  . Codeine Itching   Social History   Social History  . Marital status: Divorced    Spouse name: N/A  . Number of children: 4  . Years of education: N/A   Occupational History  . Not on file.   Social History Main Topics  . Smoking status: Current Every Day Smoker    Packs/day: 0.50    Years: 30.00    Types: Cigarettes  . Smokeless tobacco: Former NeurosurgeonUser    Types: Snuff    Quit date: 07/20/1994  . Alcohol use 0.0 oz/week     Comment: may have a drink every few months  . Drug use: No     Comment: former   . Sexual activity: Not Currently   Other Topics Concern  . Not on file   Social History Narrative   Lives with girlfriend and two children.  He has four children.       Review of Systems  All other systems reviewed and are negative.      Objective:   Physical Exam  Constitutional: He appears well-developed and well-nourished. No distress.  Neck: No JVD present.  Cardiovascular: Normal rate, regular rhythm and normal heart sounds.   Pulmonary/Chest: Effort normal and breath sounds normal. No respiratory distress. He has no wheezes. He has no rales.  Abdominal: Soft. Bowel sounds are normal. He exhibits no distension. There is no tenderness. There is no rebound and no guarding.    Musculoskeletal: He exhibits no edema.  Skin: He is not diaphoretic.  Vitals reviewed.         Assessment & Plan:  ASCVD (arteriosclerotic cardiovascular disease) - Plan: CBC with Differential/Platelet, COMPLETE METABOLIC PANEL WITH GFR, Lipid panel  Chronic bilateral low back pain without sciatica - Plan: Drug Screen, Urine  Blood pressure is excellent. I will have the patient return for fasting lipid panel. His goal LDL cholesterol is less than 70. He is on Plavix as an antiplatelet agent. He denies any chest pain or shortness of breath. He refuses a flu shot. I will perform a urine drug screen to rule out abuse or diversion  of illicit substances. If his urine drug screen is normal, I will continue to prescribe Percocet on an as-needed basis for his chronic low back pain

## 2016-05-13 ENCOUNTER — Encounter: Payer: Self-pay | Admitting: Family Medicine

## 2016-05-22 LAB — DRUG ABUSE PANEL 10-50 NO CONF, U

## 2016-05-26 ENCOUNTER — Telehealth: Payer: Self-pay | Admitting: Family Medicine

## 2016-05-26 NOTE — Telephone Encounter (Signed)
Patient needs rx for his hydrocodone  [phone]

## 2016-05-26 NOTE — Telephone Encounter (Signed)
ok 

## 2016-05-26 NOTE — Telephone Encounter (Signed)
Ok to refill??      LRF - 02/11/16

## 2016-05-27 MED ORDER — HYDROCODONE-ACETAMINOPHEN 7.5-325 MG PO TABS: 1.0000 | ORAL_TABLET | Freq: Four times a day (QID) | ORAL | 0 refills | Status: DC | PRN

## 2016-05-27 NOTE — Telephone Encounter (Signed)
RX printed, left up front and patient aware to pick up  

## 2016-05-27 NOTE — Addendum Note (Signed)
Addended by: Legrand RamsWILLIS, SANDY B on: 05/27/2016 04:54 PM   Modules accepted: Orders

## 2016-06-01 ENCOUNTER — Other Ambulatory Visit: Payer: Self-pay | Admitting: Family Medicine

## 2016-06-08 ENCOUNTER — Other Ambulatory Visit: Payer: Self-pay | Admitting: Family Medicine

## 2016-06-08 NOTE — Telephone Encounter (Signed)
Ok to refill??      Klonopin 

## 2016-06-09 NOTE — Telephone Encounter (Signed)
ok 

## 2016-06-18 ENCOUNTER — Other Ambulatory Visit: Payer: Self-pay | Admitting: Family Medicine

## 2016-07-01 ENCOUNTER — Telehealth: Payer: Self-pay | Admitting: *Deleted

## 2016-07-01 NOTE — Telephone Encounter (Signed)
Received call from patient. [phone]~ telephone.   Requested refill on Norco.   Ok to refill??  Last office visit 05/12/2016.  Last refill 05/27/2016.

## 2016-07-02 MED ORDER — HYDROCODONE-ACETAMINOPHEN 7.5-325 MG PO TABS: 1.0000 | ORAL_TABLET | Freq: Four times a day (QID) | ORAL | 0 refills | Status: DC | PRN

## 2016-07-02 NOTE — Telephone Encounter (Signed)
ok 

## 2016-07-02 NOTE — Telephone Encounter (Signed)
Prescription printed and patient made aware to come to office to pick up after 2 pm on 07/02/2016.

## 2016-07-03 ENCOUNTER — Telehealth: Payer: Self-pay | Admitting: Family Medicine

## 2016-07-03 ENCOUNTER — Telehealth: Payer: Self-pay | Admitting: Cardiovascular Disease

## 2016-07-03 MED ORDER — ATORVASTATIN CALCIUM 80 MG PO TABS: 80.0000 mg | ORAL_TABLET | Freq: Every day | ORAL | 0 refills | Status: DC

## 2016-07-03 NOTE — Telephone Encounter (Signed)
Pharmacy calling again. Says 40 mg needs Prior Auth for the 2-40 mg a day.  80 mg are no longer available due to "FDA" hold.  We can do PA or change RX or send to another pharmacy.  I called patient, [facility] [address].  One [facility] day [facility]

## 2016-07-03 NOTE — Telephone Encounter (Signed)
I spoke with pt. He reports [facility] has told him there are restrictions on Atorvastatin 80 mg. [facility] cannot [facility].  I told pt I would check with pharmacy. [facility]. FDA has put block on 80 mg Atorvastatin tablets.  It can be ordered at 40 mg with instructions to take 2 tablets by mouth daily.  Per pharmacist he has tried this but pt's insurance is requiring a prior authorization.  Pharmacist will fax us prior auth information.  Contact number for prior authorization is [phone].   I spoke with pt. He has a few weeks of Atorvastatin tablets left.  I told him I would contact him after doing prior auth.

## 2016-07-03 NOTE — Telephone Encounter (Signed)
Pt is calling about ATORVASTATIN 80 mg is there something he can replace this with and [facility] that [facility] Ins will [facility] for.  Pleas give pt a call.

## 2016-07-03 NOTE — Telephone Encounter (Signed)
Just switch to lipitor 40 poqday.

## 2016-07-03 NOTE — Telephone Encounter (Signed)
Pharmacist calling to say supply problem with 80 mg Atorvastatin.  Wants OK to fill with 40 mg Atorvastatin, pt to take 2 daily.  Approval given.

## 2016-07-06 ENCOUNTER — Other Ambulatory Visit: Payer: Self-pay | Admitting: Family Medicine

## 2016-07-06 NOTE — Telephone Encounter (Signed)
Ok to refill 

## 2016-07-06 NOTE — Telephone Encounter (Signed)
[facility] [place] [facility].  Pharmacy needs to fill with new NDC [facility](National Drug Code) number.  This number is [id number].  Per Martie LeeSabrina we need to call pharmacy with this code and Atorvastatin (90 day supply) will be covered. [facility] [phone])   and gave her this NDC number. Pharmacist reports they have tried this and other NDC numbers but medication is on back order from their vendors.  Pharmacist will try from another vendor.  If not successful she will contact other pharmacies.  Pharmacist will call us back with update.

## 2016-07-07 NOTE — Telephone Encounter (Signed)
Prescription sent to pharmacy.

## 2016-07-07 NOTE — Telephone Encounter (Signed)
ok 

## 2016-07-15 NOTE — Telephone Encounter (Signed)
[facility] and this has been resolved. Cost is now $1.20 and pt has picked up the medication.

## 2016-08-10 ENCOUNTER — Telehealth: Payer: Self-pay | Admitting: Family Medicine

## 2016-08-10 MED ORDER — HYDROCODONE-ACETAMINOPHEN 7.5-325 MG PO TABS: 1.0000 | ORAL_TABLET | Freq: Four times a day (QID) | ORAL | 0 refills | Status: DC | PRN

## 2016-08-10 NOTE — Telephone Encounter (Signed)
ok 

## 2016-08-10 NOTE — Telephone Encounter (Signed)
RX printed, left up front and patient aware to pick up via vm 

## 2016-08-10 NOTE — Telephone Encounter (Signed)
Requesting a refill on his hydrocodone - Ok to refill??      LRF 07/02/16

## 2016-08-31 ENCOUNTER — Other Ambulatory Visit: Payer: Self-pay | Admitting: Family Medicine

## 2016-08-31 NOTE — Telephone Encounter (Signed)
Medication called to pharmacy. 

## 2016-08-31 NOTE — Telephone Encounter (Signed)
ok 

## 2016-08-31 NOTE — Telephone Encounter (Signed)
Ok to refill 

## 2016-09-03 ENCOUNTER — Other Ambulatory Visit: Payer: Self-pay | Admitting: Cardiovascular Disease

## 2016-09-03 MED ORDER — NITROGLYCERIN 0.4 MG SL SUBL: 0.4000 mg | SUBLINGUAL_TABLET | SUBLINGUAL | 2 refills | Status: DC | PRN

## 2016-09-03 NOTE — Telephone Encounter (Signed)
°*  STAT* If patient is at the pharmacy, call can be transferred to refill team.   1. Which medications need to be refilled? (please list name of each medication and dose if known) Nitroglycerin 2. Which pharmacy/location (including street and city if local pharmacy) is medication to be sent to? [facility] [address],[place], 3. Do they need a 30 day or 90 day supply? 1 bottle

## 2016-09-03 NOTE — Telephone Encounter (Signed)
Pt's Rx was sent to pt's pharmacy as requested. Confirmation received.  °

## 2016-09-18 ENCOUNTER — Ambulatory Visit: Payer: Medicare Other | Admitting: Cardiovascular Disease

## 2016-09-28 ENCOUNTER — Other Ambulatory Visit: Payer: Self-pay | Admitting: Family Medicine

## 2016-09-29 NOTE — Telephone Encounter (Signed)
ok 

## 2016-09-29 NOTE — Telephone Encounter (Signed)
Ok to refill 

## 2016-10-20 ENCOUNTER — Other Ambulatory Visit: Payer: Self-pay | Admitting: Family Medicine

## 2016-10-21 NOTE — Telephone Encounter (Signed)
Is he on  or 60 mg Imdur??  I can not really tell.  Cardiology visit says 60 mg.  Don't see where change to 30 mg??

## 2016-10-22 NOTE — Telephone Encounter (Signed)
Give 60 mg a day and please clarify on med list.

## 2016-10-23 MED ORDER — ISOSORBIDE MONONITRATE ER 60 MG PO TB24: 60.0000 mg | ORAL_TABLET | Freq: Every day | ORAL | 1 refills | Status: DC

## 2016-10-23 NOTE — Telephone Encounter (Signed)
Medication correction made

## 2016-10-26 ENCOUNTER — Other Ambulatory Visit: Payer: Self-pay | Admitting: Family Medicine

## 2016-10-26 MED ORDER — ISOSORBIDE MONONITRATE ER 60 MG PO TB24: 60.0000 mg | ORAL_TABLET | Freq: Every day | ORAL | 1 refills | Status: DC

## 2016-10-27 ENCOUNTER — Telehealth: Payer: Self-pay | Admitting: Family Medicine

## 2016-10-27 NOTE — Telephone Encounter (Signed)
Pt wants all pharmacies removed from his pharmacy except the neighborhood [place].   Done

## 2016-11-02 ENCOUNTER — Other Ambulatory Visit: Payer: Self-pay | Admitting: Family Medicine

## 2016-11-09 ENCOUNTER — Other Ambulatory Visit: Payer: Self-pay | Admitting: Family Medicine

## 2016-11-09 MED ORDER — ATORVASTATIN CALCIUM 80 MG PO TABS: 80.0000 mg | ORAL_TABLET | Freq: Every day | ORAL | 1 refills | Status: DC

## 2016-11-13 ENCOUNTER — Ambulatory Visit (INDEPENDENT_AMBULATORY_CARE_PROVIDER_SITE_OTHER): Payer: Medicare Other | Admitting: Cardiovascular Disease

## 2016-11-13 ENCOUNTER — Ambulatory Visit (INDEPENDENT_AMBULATORY_CARE_PROVIDER_SITE_OTHER): Payer: Medicare Other | Admitting: Family Medicine

## 2016-11-13 ENCOUNTER — Encounter: Payer: Self-pay | Admitting: Cardiovascular Disease

## 2016-11-13 ENCOUNTER — Encounter: Payer: Self-pay | Admitting: Family Medicine

## 2016-11-13 VITALS — BP 118/70 | HR 88 | Ht 70.5 in | Wt 269.8 lb

## 2016-11-13 VITALS — BP 130/78 | HR 78 | Temp 97.8°F | Resp 16 | Ht 72.0 in | Wt 270.0 lb

## 2016-11-13 DIAGNOSIS — I25118 Atherosclerotic heart disease of native coronary artery with other forms of angina pectoris: Secondary | ICD-10-CM

## 2016-11-13 DIAGNOSIS — F321 Major depressive disorder, single episode, moderate: Secondary | ICD-10-CM | POA: Diagnosis not present

## 2016-11-13 DIAGNOSIS — I209 Angina pectoris, unspecified: Secondary | ICD-10-CM | POA: Diagnosis not present

## 2016-11-13 DIAGNOSIS — I10 Essential (primary) hypertension: Secondary | ICD-10-CM | POA: Diagnosis not present

## 2016-11-13 DIAGNOSIS — Z720 Tobacco use: Secondary | ICD-10-CM

## 2016-11-13 DIAGNOSIS — E7800 Pure hypercholesterolemia, unspecified: Secondary | ICD-10-CM | POA: Diagnosis not present

## 2016-11-13 MED ORDER — ALPRAZOLAM 0.5 MG PO TABS: 0.5000 mg | ORAL_TABLET | Freq: Three times a day (TID) | ORAL | 0 refills | Status: DC | PRN

## 2016-11-13 MED ORDER — ESCITALOPRAM OXALATE 10 MG PO TABS: 10.0000 mg | ORAL_TABLET | Freq: Every day | ORAL | 3 refills | Status: DC

## 2016-11-13 NOTE — Progress Notes (Signed)
Subjective:    Patient ID: [name], male    DOB: May 20, 1976, 41 y.o.   MRN: [id number]  HPI Patient is in the process of leaving his wife. There is tremendous stress in the relationship. This is causing the patient to have increasing frequency of panic attacks unrelieved by Klonopin 1 mg by mouth twice a day. He also reports depression, anhedonia, and insomnia. He is extremely sad. He sees no joint life. He has no energy or desire. He denies suicidal ideation or homicidal ideation. Past Medical History:  Diagnosis Date  . Anxiety   . Arthritis   . CHF (congestive heart failure) (HCC)   . Complication of anesthesia    difficulty awakening following appendectomy   . Dizziness    with humidity   . Hx of CABG   . Hyperlipidemia   . Hypertension   . Kidney stones   . MYOCARDIAL INFARCTION 10/13/2008   Qualifier: Diagnosis of  By: Juanda Chance, MD, Johny Chess   . Numbness in right leg    pt relates to sciatica  . Tobacco user    Past Surgical History:  Procedure Laterality Date  . APPENDECTOMY    . CARDIAC CATHETERIZATION    . CORONARY ARTERY BYPASS GRAFT     status post diaphragmatic wall infarction Rx BMS RCA 03-11-08   . CORONARY STENT PLACEMENT    . CYSTOSCOPY WITH RETROGRADE PYELOGRAM, URETEROSCOPY AND STENT PLACEMENT Bilateral 03/16/2015   Procedure: CYSTOSCOPY WITH BILATERAL RETROGRADE PYELOGRAM, RIGHT URETEROSCOPY, STONE BASKETRY WITH LASER LITHOTRIPSY ON THE RIGHT,  AND BILATERAL STENT PLACEMENT;  Surgeon: Malen Gauze, MD;  Location: [facility];  Service: Urology;  Laterality: Bilateral;  . CYSTOSCOPY WITH RETROGRADE PYELOGRAM, URETEROSCOPY AND STENT PLACEMENT Left 04/02/2015   Procedure: CYSTOSCOPY WITH RETROGRADE PYELOGRAM, URETEROSCOPY REMOVAL BILATERAL STENTS;  Surgeon: Malen Gauze, MD;  Location: [facility];  Service: Urology;  Laterality: Left;  . HOLMIUM LASER APPLICATION Right 03/16/2015   Procedure: HOLMIUM LASER APPLICATION;  Surgeon: Malen Gauze, MD;  Location: [facility];  Service: Urology;  Laterality: Right;  . LAPAROSCOPIC APPENDECTOMY N/A 09/17/2013   Procedure: APPENDECTOMY LAPAROSCOPIC;  Surgeon: Shelly Rubenstein, MD;  Location: [facility];  Service: General;  Laterality: N/A;  . LEFT HEART CATHETERIZATION WITH CORONARY/GRAFT ANGIOGRAM N/A 10/19/2011   Procedure: LEFT HEART CATHETERIZATION WITH Isabel Caprice;  Surgeon: Herby Abraham, MD;  Location: [facility] CATH LAB;  Service: Cardiovascular;  Laterality: N/A;  . ORTHOPEDIC SURGERY    . TONSILLECTOMY     Current Outpatient Prescriptions on File Prior to Visit  Medication Sig Dispense Refill  . atorvastatin (LIPITOR) 80 MG tablet Take 1 tablet (80 mg total) by mouth daily at 6 PM. 90 tablet 1  . baclofen (LIORESAL) 10 MG tablet TAKE 1 TABLET BY MOUTH THREE TIMES DAILY 90 tablet 0  . clonazePAM (KLONOPIN) 1 MG tablet TAKE ONE-HALF TO ONE TABLET BY MOUTH TWICE DAILY AS NEEDED FOR ANXIETY 60 tablet 2  . clopidogrel (PLAVIX) 75 MG tablet TAKE ONE TABLET BY MOUTH IN THE MORNING 30 tablet 5  . fenofibrate (TRICOR) 48 MG tablet TAKE ONE TABLET BY MOUTH ONCE DAILY 30 tablet 5  . gabapentin (NEURONTIN) 300 MG capsule TAKE ONE CAPSULE BY MOUTH THREE TIMES DAILY 90 capsule 5  . HYDROcodone-acetaminophen (NORCO) 7.5-325 MG tablet Take 1 tablet by mouth every 6 (six) hours as needed for moderate pain. 30 tablet 0  . isosorbide mononitrate (IMDUR) 60 MG 24 hr tablet Take 1 tablet (60  mg total) by mouth daily. 90 tablet 1  . losartan (COZAAR) 100 MG tablet TAKE ONE TABLET BY MOUTH ONCE DAILY 90 tablet 1  . metoprolol tartrate (LOPRESSOR) 25 MG tablet TAKE ONE TABLET BY MOUTH TWICE DAILY 60 tablet 5  . Multiple Vitamin (MULTI-VITAMIN DAILY PO) Take 1 Dose by mouth daily. [employer] vitamin pack takes 1 packet by mouth daily    . nitroGLYCERIN (NITROSTAT) 0.4 MG SL tablet Place 1 tablet (0.4 mg total) under the tongue every 5 (five) minutes as needed for chest pain. 25 tablet 2  . RANEXA 500 MG 12  hr tablet TAKE ONE TABLET BY MOUTH TWICE DAILY 180 tablet 0   No current facility-administered medications on file prior to visit.    Allergies  Allergen Reactions  . Celexa [Citalopram] Itching and Rash  . Codeine Itching   Social History   Social History  . Marital status: Divorced    Spouse name: N/A  . Number of children: 4  . Years of education: N/A   Occupational History  . Not on file.   Social History Main Topics  . Smoking status: Current Every Day Smoker    Packs/day: 0.50    Years: 30.00    Types: Cigarettes  . Smokeless tobacco: Former Neurosurgeon    Types: Snuff    Quit date: 07/20/1994  . Alcohol use 0.0 oz/week     Comment: may have a drink every few months  . Drug use: No     Comment: former   . Sexual activity: Not Currently   Other Topics Concern  . Not on file   Social History Narrative   Lives with girlfriend and two children.  He has four children.        Review of Systems  All other systems reviewed and are negative.      Objective:   Physical Exam  Constitutional: He appears well-developed and well-nourished. No distress.  HENT:  Nose: Nose normal.  Mouth/Throat: Oropharynx is clear and moist. No oropharyngeal exudate.  Eyes: Conjunctivae are normal.  Neck: Neck supple.  Cardiovascular: Normal rate, regular rhythm and normal heart sounds.   Pulmonary/Chest: Effort normal and breath sounds normal. No respiratory distress. He has no wheezes. He has no rales.  Abdominal: Soft. Bowel sounds are normal. He exhibits no distension. There is no tenderness. There is no rebound and no guarding.  Lymphadenopathy:    He has no cervical adenopathy.  Skin: He is not diaphoretic.  Vitals reviewed.         Assessment & Plan:  Depression, major, single episode, moderate (HCC)  Begin Lexapro 10 mg by mouth daily, discontinue Klonopin and replaced with Xanax 0.5 mg by mouth every 8 hours. Use this temporarily. Hopefully once the Lexapro kicks in the  can switch back to his Klonopin. Recheck in one month or sooner if worse

## 2016-11-13 NOTE — Patient Instructions (Signed)
Medication Instructions:  Your physician recommends that you continue on your current medications as directed. Please refer to the Current Medication list given to you today.   Labwork: Your physician recommends that you schedule a follow-up appointment in: October--Scheduled for October 8,2018.  This is fasting. The lab opens at 7:30 AM--CMET and lipid profile    Testing/Procedures: none  Follow-Up: Your physician recommends that you schedule a follow-up appointment in: 12 months. Please call our office in about 9 months to schedule this appointment.     Any Other Special Instructions Will Be Listed Below (If Applicable).     If you need a refill on your cardiac medications before your next appointment, please call your pharmacy.

## 2016-11-13 NOTE — Progress Notes (Signed)
Chief Complaint  Patient presents with  . Follow-up    History of Present Illness: 41 year old male with history of CAD s/p CABG, HTN, HLD and tobacco abuse who is here today for follow up. His coronary history began in 2009 with an inferior MI requiring RCA PCI and subsequent 4V CABG. He then presented in 2010 with a NSTEMI requiring PCI of the Circumflex. Cardiac cath on 10/19/11 with stable CAD, bypasses. He was discharged home on Imdur and Ranexa.      He is here today for follow up. The patient denies any chest pain, dyspnea, palpitations, lower extremity edema, orthopnea, PND, dizziness, near syncope or syncope. He is smoking 3-4 cigarettes per day.  He is going through a divorce and under much stress.   Primary Care Physician: Odette Fraction, MD   Past Medical History:  Diagnosis Date  . Anxiety   . Arthritis   . CHF (congestive heart failure) ([place])   . Complication of anesthesia    difficulty awakening following appendectomy   . Dizziness    with humidity   . Hx of CABG   . Hyperlipidemia   . Hypertension   . Kidney stones   . MYOCARDIAL INFARCTION 10/13/2008   Qualifier: Diagnosis of  By: Olevia Perches, MD, Glenetta Hew   . Numbness in right leg    pt relates to sciatica  . Tobacco user     Past Surgical History:  Procedure Laterality Date  . APPENDECTOMY    . CARDIAC CATHETERIZATION    . CORONARY ARTERY BYPASS GRAFT     status post diaphragmatic wall infarction Rx BMS RCA 03-11-08   . CORONARY STENT PLACEMENT    . CYSTOSCOPY WITH RETROGRADE PYELOGRAM, URETEROSCOPY AND STENT PLACEMENT Bilateral 03/16/2015   Procedure: CYSTOSCOPY WITH BILATERAL RETROGRADE PYELOGRAM, RIGHT URETEROSCOPY, STONE BASKETRY WITH LASER LITHOTRIPSY ON THE RIGHT,  AND BILATERAL STENT PLACEMENT;  Surgeon: Cleon Gustin, MD;  [facility]: [facility];  Service: Urology;  Laterality: Bilateral;  . CYSTOSCOPY WITH RETROGRADE PYELOGRAM, URETEROSCOPY AND STENT PLACEMENT Left 04/02/2015   Procedure:  CYSTOSCOPY WITH RETROGRADE PYELOGRAM, URETEROSCOPY REMOVAL BILATERAL STENTS;  Surgeon: Cleon Gustin, MD;  [facility];  Service: Urology;  Laterality: Left;  . HOLMIUM LASER APPLICATION Right 6/38/7564   Procedure: HOLMIUM LASER APPLICATION;  Surgeon: Cleon Gustin, MD;  [facility];  Service: Urology;  Laterality: Right;  . LAPAROSCOPIC APPENDECTOMY N/A 09/17/2013   Procedure: APPENDECTOMY LAPAROSCOPIC;  Surgeon: Harl Bowie, MD;  Location: [facility];  Service: General;  Laterality: N/A;  . LEFT HEART CATHETERIZATION WITH CORONARY/GRAFT ANGIOGRAM N/A 10/19/2011   Procedure: LEFT HEART CATHETERIZATION WITH Beatrix Fetters;  Surgeon: Hillary Bow, MD;  Location[facility] CATH LAB;  Service: Cardiovascular;  Laterality: N/A;  . ORTHOPEDIC SURGERY    . TONSILLECTOMY      Current Outpatient Prescriptions  Medication Sig Dispense Refill  . atorvastatin (LIPITOR) 80 MG tablet Take 1 tablet (80 mg total) by mouth daily at 6 PM. 90 tablet 1  . baclofen (LIORESAL) 10 MG tablet TAKE 1 TABLET BY MOUTH THREE TIMES DAILY 90 tablet 0  . clonazePAM (KLONOPIN) 1 MG tablet TAKE ONE-HALF TO ONE TABLET BY MOUTH TWICE DAILY AS NEEDED FOR ANXIETY 60 tablet 2  . clopidogrel (PLAVIX) 75 MG tablet TAKE ONE TABLET BY MOUTH IN THE MORNING 30 tablet 5  . fenofibrate (TRICOR) 48 MG tablet TAKE ONE TABLET BY MOUTH ONCE DAILY 30 tablet 5  . gabapentin (NEURONTIN) 300 MG capsule  TAKE ONE CAPSULE BY MOUTH THREE TIMES DAILY 90 capsule 5  . HYDROcodone-acetaminophen (NORCO) 7.5-325 MG tablet Take 1 tablet by mouth every 6 (six) hours as needed for moderate pain. 30 tablet 0  . isosorbide mononitrate (IMDUR) 60 MG 24 hr tablet Take 1 tablet (60 mg total) by mouth daily. 90 tablet 1  . losartan (COZAAR) 100 MG tablet TAKE ONE TABLET BY MOUTH ONCE DAILY 90 tablet 1  . metoprolol tartrate (LOPRESSOR) 25 MG tablet TAKE ONE TABLET BY MOUTH TWICE DAILY 60 tablet 5  . Multiple Vitamin (MULTI-VITAMIN DAILY  PO) Take 1 Dose by mouth daily. [employer] vitamin pack takes 1 packet by mouth daily    . nitroGLYCERIN (NITROSTAT) 0.4 MG SL tablet Place 1 tablet (0.4 mg total) under the tongue every 5 (five) minutes as needed for chest pain. 25 tablet 2  . RANEXA 500 MG 12 hr tablet TAKE ONE TABLET BY MOUTH TWICE DAILY 180 tablet 0   No current facility-administered medications for this visit.     Allergies  Allergen Reactions  . Celexa [Citalopram] Itching and Rash  . Codeine Itching    Social History   Social History  . Marital status: Divorced    Spouse name: N/A  . Number of children: 4  . Years of education: N/A   Occupational History  . Not on file.   Social History Main Topics  . Smoking status: Current Every Day Smoker    Packs/day: 0.50    Years: 30.00    Types: Cigarettes  . Smokeless tobacco: Former Systems developer    Types: Snuff    Quit date: 07/20/1994  . Alcohol use 0.0 oz/week     Comment: may have a drink every few months  . Drug use: No     Comment: former   . Sexual activity: Not Currently   Other Topics Concern  . Not on file   Social History Narrative   Lives with girlfriend and two children.  He has four children.      Family History  Problem Relation Age of Onset  . Adopted: Yes  . Cancer Mother   . Diabetes Mother   . Hyperlipidemia Mother   . Hypertension Mother   . Heart failure Mother     Died age 8  . Cancer Sister   . Diabetes Sister   . Hyperlipidemia Sister   . Hypertension Sister     Review of Systems:  As stated in the HPI and otherwise negative.   BP 118/70   Pulse 88   Ht 5' 10.5" (1.791 m)   Wt 269 lb 12.8 oz (122.4 kg)   SpO2 96%   BMI 38.17 kg/m   Physical Examination: General: Well developed, well nourished, NAD  HEENT: OP clear, mucus membranes moist  SKIN: warm, dry. No rashes. Neuro: No focal deficits  Musculoskeletal: Muscle strength 5/5 all ext  Psychiatric: Mood and affect normal  Neck: No JVD, no carotid bruits, no  thyromegaly, no lymphadenopathy.  Lungs:Clear bilaterally, no wheezes, rhonci, crackles Cardiovascular: Regular rate and rhythm. No murmurs, gallops or rubs. Abdomen:Soft. Bowel sounds present. Non-tender.  Extremities: No lower extremity edema. Pulses are 2 + in the bilateral DP/PT.   EKG:  EKG is ordered today. The ekg ordered today demonstrates NSR, rate 79 bpm. Inferior Q waves, chronic.   Recent Labs: 11/15/2015: B Natriuretic Peptide 21.4 05/12/2016: ALT 24; BUN 9; Creat 1.06; Hemoglobin 15.4; Platelets 294; Potassium 4.4; Sodium 138   Lipid Panel  Component Value Date/Time   CHOL 188 05/12/2016 1133   TRIG 334 (H) 05/12/2016 1133   HDL 42 05/12/2016 1133   CHOLHDL 4.5 05/12/2016 1133   VLDL 67 (H) 05/12/2016 1133   LDLCALC 79 05/12/2016 1133   LDLDIRECT 103.2 06/15/2012 1446     Wt Readings from Last 3 Encounters:  11/13/16 269 lb 12.8 oz (122.4 kg)  05/12/16 282 lb (127.9 kg)  03/12/16 281 lb 9.6 oz (127.7 kg)     Other studies Reviewed: Additional studies/ records that were reviewed today include: . Review of the above records demonstrates:    Assessment and Plan:   1. CAD with chronic stable angina: He has no chest pain suggestive of unstable angina. He does have chronic stable chest pains. Last cath April 2017 with stable disease, bypasses. Continue Plavix, statin, beta blocker, Ranexa and Imdur. He does not tolerate ASA due to GI upset. .   2. HYPERLIPIDEMIA/Hypertriglyceridemia:. Lipids and TG controlled. Continue statin and Tricor.  Repeat lipids and LFTs in October 2018.   3. TOBACCO USE: Complete smoking cessation is recommended.    4. HTN: BP is controlled. No changes   Current medicines are reviewed at length with the patient today.  The patient does not have concerns regarding medicines.  The following changes have been made:  no change  Labs/ tests ordered today include:   Orders Placed This Encounter  Procedures  . Comp Met (CMET)  .  Lipid Profile  . EKG 12-Lead    Disposition:   FU with me in 12  months  Signed, Lauree Chandler, MD 11/13/2016 10:04 AM    [facility], [place], [place]  [address] Phone: [phone]; Fax: [phone]

## 2016-11-22 ENCOUNTER — Other Ambulatory Visit: Payer: Self-pay | Admitting: Family Medicine

## 2016-12-01 ENCOUNTER — Other Ambulatory Visit: Payer: Self-pay | Admitting: Family Medicine

## 2016-12-07 ENCOUNTER — Other Ambulatory Visit: Payer: Self-pay | Admitting: Family Medicine

## 2016-12-15 ENCOUNTER — Encounter: Payer: Self-pay | Admitting: Family Medicine

## 2016-12-15 ENCOUNTER — Ambulatory Visit (INDEPENDENT_AMBULATORY_CARE_PROVIDER_SITE_OTHER): Payer: Medicare Other | Admitting: Family Medicine

## 2016-12-15 VITALS — BP 110/80 | HR 78 | Temp 97.9°F | Resp 16 | Ht 72.0 in | Wt 261.0 lb

## 2016-12-15 DIAGNOSIS — F418 Other specified anxiety disorders: Secondary | ICD-10-CM

## 2016-12-15 DIAGNOSIS — I25118 Atherosclerotic heart disease of native coronary artery with other forms of angina pectoris: Secondary | ICD-10-CM | POA: Diagnosis not present

## 2016-12-15 MED ORDER — CLONAZEPAM 1 MG PO TABS: 1.0000 mg | ORAL_TABLET | Freq: Two times a day (BID) | ORAL | 1 refills | Status: DC | PRN

## 2016-12-15 NOTE — Progress Notes (Signed)
Subjective:    Patient ID: [name], male    DOB: June 21, 1976, 41 y.o.   MRN: [id number]  HPI  11/13/16 Patient is in the process of leaving his wife. There is tremendous stress in the relationship. This is causing the patient to have increasing frequency of panic attacks unrelieved by Klonopin 1 mg by mouth twice a day. He also reports depression, anhedonia, and insomnia. He is extremely sad. He sees no joy in  life. He has no energy or desire. He denies suicidal ideation or homicidal ideation.  At that time, my plan was: Begin Lexapro 10 mg by mouth daily, discontinue Klonopin and replaced with Xanax 0.5 mg by mouth every 8 hours. Use this temporarily. Hopefully once the Lexapro kicks in the can switch back to his Klonopin. Recheck in one month or sooner if worse.  12/15/16 The patient has seen no benefit from Lexapro. He also does not see any change since switching to Xanax. He will prefer to go back to his original Klonopin that he was taking twice a day. He denies any panic attacks. He states that his biggest issue now his anger and frustration at the condition of his marriage. He has decided to leave his wife. He feels that nothing will improve as long as he is in his current situation. He states that they're beyond seeing counseling. He denies any suicidal ideation or homicidal ideation. Patient's ultimate goal is to remove himself from the bad situation and to focus on raising his child Past Medical History:  Diagnosis Date  . Anxiety   . Arthritis   . CHF (congestive heart failure) (HCC)   . Complication of anesthesia    difficulty awakening following appendectomy   . Dizziness    with humidity   . Hx of CABG   . Hyperlipidemia   . Hypertension   . Kidney stones   . MYOCARDIAL INFARCTION 10/13/2008   Qualifier: Diagnosis of  By: Juanda Chance, MD, Johny Chess   . Numbness in right leg    pt relates to sciatica  . Tobacco user    Past Surgical History:  Procedure  Laterality Date  . APPENDECTOMY    . CARDIAC CATHETERIZATION    . CORONARY ARTERY BYPASS GRAFT     status post diaphragmatic wall infarction Rx BMS RCA 03-11-08   . CORONARY STENT PLACEMENT    . CYSTOSCOPY WITH RETROGRADE PYELOGRAM, URETEROSCOPY AND STENT PLACEMENT Bilateral 03/16/2015   Procedure: CYSTOSCOPY WITH BILATERAL RETROGRADE PYELOGRAM, RIGHT URETEROSCOPY, STONE BASKETRY WITH LASER LITHOTRIPSY ON THE RIGHT,  AND BILATERAL STENT PLACEMENT;  Surgeon: Malen Gauze, MD;  Location: [facility];  Service: Urology;  Laterality: Bilateral;  . CYSTOSCOPY WITH RETROGRADE PYELOGRAM, URETEROSCOPY AND STENT PLACEMENT Left 04/02/2015   Procedure: CYSTOSCOPY WITH RETROGRADE PYELOGRAM, URETEROSCOPY REMOVAL BILATERAL STENTS;  Surgeon: Malen Gauze, MD;  Location: [facility];  Service: Urology;  Laterality: Left;  . HOLMIUM LASER APPLICATION Right 03/16/2015   Procedure: HOLMIUM LASER APPLICATION;  Surgeon: Malen Gauze, MD;  Location: [facility];  Service: Urology;  Laterality: Right;  . LAPAROSCOPIC APPENDECTOMY N/A 09/17/2013   Procedure: APPENDECTOMY LAPAROSCOPIC;  Surgeon: Shelly Rubenstein, MD;  [facility]: [facility];  Service: General;  Laterality: N/A;  . LEFT HEART CATHETERIZATION WITH CORONARY/GRAFT ANGIOGRAM N/A 10/19/2011   Procedure: LEFT HEART CATHETERIZATION WITH Isabel Caprice;  Surgeon: Herby Abraham, MD;  Location: [facility] CATH LAB;  Service: Cardiovascular;  Laterality: N/A;  . ORTHOPEDIC SURGERY    . TONSILLECTOMY  Current Outpatient Prescriptions on File Prior to Visit  Medication Sig Dispense Refill  . ALPRAZolam (XANAX) 0.5 MG tablet Take 1 tablet (0.5 mg total) by mouth 3 (three) times daily as needed for anxiety. 90 tablet 0  . atorvastatin (LIPITOR) 80 MG tablet Take 1 tablet (80 mg total) by mouth daily at 6 PM. 90 tablet 1  . baclofen (LIORESAL) 10 MG tablet TAKE 1 TABLET BY MOUTH THREE TIMES DAILY 90 tablet 0  . clopidogrel (PLAVIX) 75 MG tablet TAKE ONE TABLET BY MOUTH  IN THE MORNING 30 tablet 5  . escitalopram (LEXAPRO) 10 MG tablet Take 1 tablet (10 mg total) by mouth daily. 30 tablet 3  . fenofibrate (TRICOR) 48 MG tablet TAKE ONE TABLET BY MOUTH ONCE DAILY 30 tablet 5  . gabapentin (NEURONTIN) 300 MG capsule TAKE ONE CAPSULE BY MOUTH THREE TIMES DAILY 90 capsule 5  . HYDROcodone-acetaminophen (NORCO) 7.5-325 MG tablet Take 1 tablet by mouth every 6 (six) hours as needed for moderate pain. 30 tablet 0  . isosorbide mononitrate (IMDUR) 60 MG 24 hr tablet Take 1 tablet (60 mg total) by mouth daily. 90 tablet 1  . losartan (COZAAR) 100 MG tablet TAKE ONE TABLET BY MOUTH ONCE DAILY 90 tablet 1  . metoprolol tartrate (LOPRESSOR) 25 MG tablet TAKE ONE TABLET BY MOUTH TWICE DAILY 60 tablet 5  . Multiple Vitamin (MULTI-VITAMIN DAILY PO) Take 1 Dose by mouth daily. [employer] vitamin pack takes 1 packet by mouth daily    . nitroGLYCERIN (NITROSTAT) 0.4 MG SL tablet Place 1 tablet (0.4 mg total) under the tongue every 5 (five) minutes as needed for chest pain. 25 tablet 2  . RANEXA 500 MG 12 hr tablet TAKE ONE TABLET BY MOUTH TWICE DAILY 180 tablet 0   No current facility-administered medications on file prior to visit.    Allergies  Allergen Reactions  . Celexa [Citalopram] Itching and Rash  . Codeine Itching   Social History   Social History  . Marital status: Divorced    Spouse name: N/A  . Number of children: 4  . Years of education: N/A   Occupational History  . Not on file.   Social History Main Topics  . Smoking status: Current Every Day Smoker    Packs/day: 0.50    Years: 30.00    Types: Cigarettes  . Smokeless tobacco: Former NeurosurgeonUser    Types: Snuff    Quit date: 07/20/1994  . Alcohol use 0.0 oz/week     Comment: may have a drink every few months  . Drug use: No     Comment: former   . Sexual activity: Not Currently   Other Topics Concern  . Not on file   Social History Narrative   Lives with girlfriend and two children.  He has four children.         Review of Systems  All other systems reviewed and are negative.      Objective:   Physical Exam  Constitutional: He appears well-developed and well-nourished. No distress.  HENT:  Nose: Nose normal.  Mouth/Throat: Oropharynx is clear and moist. No oropharyngeal exudate.  Eyes: Conjunctivae are normal.  Neck: Neck supple.  Cardiovascular: Normal rate, regular rhythm and normal heart sounds.   Pulmonary/Chest: Effort normal and breath sounds normal. No respiratory distress. He has no wheezes. He has no rales.  Abdominal: Soft. Bowel sounds are normal. He exhibits no distension. There is no tenderness. There is no rebound and no guarding.  Lymphadenopathy:  He has no cervical adenopathy.  Skin: He is not diaphoretic.  Vitals reviewed.         Assessment & Plan:  Situational anxiety Discontinue Lexapro as the patient has seen no benefit. Fortunately the panic attacks stopped as the patient has now resolved and his marriage. I apologized to the patient for his current situation. I wish there was more that I can do to help him. However I do believe this is situational and medication will not make an impact in this area. Discontinue Xanax. Resume his previous Klonopin 1 mg twice a day.

## 2017-01-06 ENCOUNTER — Other Ambulatory Visit: Payer: Self-pay | Admitting: Family Medicine

## 2017-01-09 ENCOUNTER — Other Ambulatory Visit: Payer: Self-pay | Admitting: Family Medicine

## 2017-01-11 ENCOUNTER — Telehealth: Payer: Self-pay | Admitting: Family Medicine

## 2017-01-11 NOTE — Telephone Encounter (Signed)
Patient is requesting a refill on Hydrocodone - Ok to refill??       LOV - 12/15/16 LRF - 08/10/16 ( he takes prn back pain)

## 2017-01-11 NOTE — Telephone Encounter (Signed)
Okay to refill? 

## 2017-01-12 MED ORDER — HYDROCODONE-ACETAMINOPHEN 7.5-325 MG PO TABS: 1.0000 | ORAL_TABLET | Freq: Four times a day (QID) | ORAL | 0 refills | Status: DC | PRN

## 2017-01-12 NOTE — Telephone Encounter (Signed)
RX printed, left up front and patient aware to pick up in am 

## 2017-02-08 ENCOUNTER — Other Ambulatory Visit: Payer: Self-pay | Admitting: Family Medicine

## 2017-02-15 ENCOUNTER — Encounter: Payer: Self-pay | Admitting: Family Medicine

## 2017-02-15 ENCOUNTER — Ambulatory Visit (INDEPENDENT_AMBULATORY_CARE_PROVIDER_SITE_OTHER): Payer: Medicare Other | Admitting: Family Medicine

## 2017-02-15 ENCOUNTER — Other Ambulatory Visit: Payer: Self-pay | Admitting: Family Medicine

## 2017-02-15 VITALS — BP 122/70 | HR 80 | Temp 98.2°F | Resp 18 | Ht 72.0 in | Wt 252.0 lb

## 2017-02-15 DIAGNOSIS — K047 Periapical abscess without sinus: Secondary | ICD-10-CM | POA: Diagnosis not present

## 2017-02-15 DIAGNOSIS — R6884 Jaw pain: Secondary | ICD-10-CM

## 2017-02-15 DIAGNOSIS — I25118 Atherosclerotic heart disease of native coronary artery with other forms of angina pectoris: Secondary | ICD-10-CM | POA: Diagnosis not present

## 2017-02-15 MED ORDER — AMOXICILLIN-POT CLAVULANATE 875-125 MG PO TABS: 1.0000 | ORAL_TABLET | Freq: Two times a day (BID) | ORAL | 0 refills | Status: DC

## 2017-02-15 NOTE — Progress Notes (Signed)
Subjective:    Patient ID: [name], male    DOB: 09/28/1975, 41 y.o.   MRN: [id number]  HPI  Presents reporting pain in the left side of his jaw and question sif he may have an "abscess."  Been hurting for 3 weeks. The left lower molar is tender and the gum below it is red, tender nad swollen.   Past Medical History:  Diagnosis Date  . Anxiety   . Arthritis   . CHF (congestive heart failure) (HCC)   . Complication of anesthesia    difficulty awakening following appendectomy   . Dizziness    with humidity   . Hx of CABG   . Hyperlipidemia   . Hypertension   . Kidney stones   . MYOCARDIAL INFARCTION 10/13/2008   Qualifier: Diagnosis of  By: Juanda ChanceBrodie, MD, Johny ChessFACC, Bruce Rogers   . Numbness in right leg    pt relates to sciatica  . Tobacco user    Past Surgical History:  Procedure Laterality Date  . APPENDECTOMY    . CARDIAC CATHETERIZATION    . CORONARY ARTERY BYPASS GRAFT     status post diaphragmatic wall infarction Rx BMS RCA 03-11-08   . CORONARY STENT PLACEMENT    . CYSTOSCOPY WITH RETROGRADE PYELOGRAM, URETEROSCOPY AND STENT PLACEMENT Bilateral 03/16/2015   Procedure: CYSTOSCOPY WITH BILATERAL RETROGRADE PYELOGRAM, RIGHT URETEROSCOPY, STONE BASKETRY WITH LASER LITHOTRIPSY ON THE RIGHT,  AND BILATERAL STENT PLACEMENT;  Surgeon: Malen GauzePatrick L McKenzie, MD;  Location: [facility];  Service: Urology;  Laterality: Bilateral;  . CYSTOSCOPY WITH RETROGRADE PYELOGRAM, URETEROSCOPY AND STENT PLACEMENT Left 04/02/2015   Procedure: CYSTOSCOPY WITH RETROGRADE PYELOGRAM, URETEROSCOPY REMOVAL BILATERAL STENTS;  Surgeon: Malen GauzePatrick L McKenzie, MD;  Location: [facility];  Service: Urology;  Laterality: Left;  . HOLMIUM LASER APPLICATION Right 03/16/2015   Procedure: HOLMIUM LASER APPLICATION;  Surgeon: Malen GauzePatrick L McKenzie, MD;  Location: [facility];  Service: Urology;  Laterality: Right;  . LAPAROSCOPIC APPENDECTOMY N/A 09/17/2013   Procedure: APPENDECTOMY LAPAROSCOPIC;  Surgeon: Shelly Rubensteinouglas A Blackman, MD;   Location: [facility];  Service: General;  Laterality: N/A;  . LEFT HEART CATHETERIZATION WITH CORONARY/GRAFT ANGIOGRAM N/A 10/19/2011   Procedure: LEFT HEART CATHETERIZATION WITH Isabel CapriceORONARY/GRAFT ANGIOGRAM;  Surgeon: Herby Abrahamhomas D Stuckey, MD;  [facility];  Service: Cardiovascular;  Laterality: N/A;  . ORTHOPEDIC SURGERY    . TONSILLECTOMY     Current Outpatient Prescriptions on File Prior to Visit  Medication Sig Dispense Refill  . atorvastatin (LIPITOR) 80 MG tablet TAKE 1 TABLET BY MOUTH ONCE DAILY AT  6  PM 90 tablet 0  . baclofen (LIORESAL) 10 MG tablet TAKE 1 TABLET BY MOUTH THREE TIMES DAILY 90 tablet 0  . clonazePAM (KLONOPIN) 1 MG tablet Take 1 tablet (1 mg total) by mouth 2 (two) times daily as needed for anxiety. 60 tablet 1  . clopidogrel (PLAVIX) 75 MG tablet TAKE ONE TABLET BY MOUTH IN THE MORNING 30 tablet 5  . escitalopram (LEXAPRO) 10 MG tablet Take 1 tablet (10 mg total) by mouth daily. 30 tablet 3  . fenofibrate (TRICOR) 48 MG tablet TAKE ONE TABLET BY MOUTH ONCE DAILY 30 tablet 5  . gabapentin (NEURONTIN) 300 MG capsule TAKE ONE CAPSULE BY MOUTH THREE TIMES DAILY 90 capsule 5  . HYDROcodone-acetaminophen (NORCO) 7.5-325 MG tablet Take 1 tablet by mouth every 6 (six) hours as needed for moderate pain. 30 tablet 0  . isosorbide mononitrate (IMDUR) 60 MG 24 hr tablet Take 1 tablet (60 mg total)  by mouth daily. 90 tablet 1  . losartan (COZAAR) 100 MG tablet TAKE ONE TABLET BY MOUTH ONCE DAILY 90 tablet 1  . metoprolol tartrate (LOPRESSOR) 25 MG tablet TAKE ONE TABLET BY MOUTH TWICE DAILY 60 tablet 5  . Multiple Vitamin (MULTI-VITAMIN DAILY PO) Take 1 Dose by mouth daily. [employer] vitamin pack takes 1 packet by mouth daily    . nitroGLYCERIN (NITROSTAT) 0.4 MG SL tablet Place 1 tablet (0.4 mg total) under the tongue every 5 (five) minutes as needed for chest pain. 25 tablet 2  . RANEXA 500 MG 12 hr tablet TAKE ONE TABLET BY MOUTH TWICE DAILY 180 tablet 0   No current  facility-administered medications on file prior to visit.    Allergies  Allergen Reactions  . Celexa [Citalopram] Itching and Rash  . Codeine Itching   Social History   Social History  . Marital status: Divorced    Spouse name: N/A  . Number of children: 4  . Years of education: N/A   Occupational History  . Not on file.   Social History Main Topics  . Smoking status: Current Every Day Smoker    Packs/day: 0.50    Years: 30.00    Types: Cigarettes  . Smokeless tobacco: Former NeurosurgeonUser    Types: Snuff    Quit date: 07/20/1994  . Alcohol use 0.0 oz/week     Comment: may have a drink every few months  . Drug use: No     Comment: former   . Sexual activity: Not Currently   Other Topics Concern  . Not on file   Social History Narrative   Lives with girlfriend and two children.  He has four children.        Review of Systems  All other systems reviewed and are negative.      Objective:   Physical Exam  Constitutional: He appears well-developed and well-nourished. No distress.  HENT:  Nose: Nose normal.  Mouth/Throat: Oropharynx is clear and moist. Dental abscesses present. No oropharyngeal exudate.    Eyes: Conjunctivae are normal.  Neck: Neck supple.  Cardiovascular: Normal rate, regular rhythm and normal heart sounds.   Pulmonary/Chest: Effort normal and breath sounds normal. No respiratory distress. He has no wheezes. He has no rales.  Abdominal: Soft. Bowel sounds are normal. He exhibits no distension. There is no tenderness. There is no rebound and no guarding.  Lymphadenopathy:    He has no cervical adenopathy.  Skin: He is not diaphoretic.  Vitals reviewed.  The area circled in red below the second molar is erythematous swollen and painful       Assessment & Plan:  Jaw pain  Abscessed tooth - Plan: amoxicillin-clavulanate (AUGMENTIN) 875-125 MG tablet  Appears to have an abscess tooth. Begin Augmentin 875 mg by mouth twice a day for 10 days.  Recommended the patient call his dentist immediately for evaluation as this will likely require further operative management.

## 2017-02-16 NOTE — Telephone Encounter (Signed)
ok 

## 2017-02-16 NOTE — Telephone Encounter (Signed)
Ok to refill 

## 2017-02-18 ENCOUNTER — Telehealth: Payer: Self-pay | Admitting: Family Medicine

## 2017-02-18 NOTE — Telephone Encounter (Signed)
Pt states that he went to dentist and he does not have an abscess but has a bone growing in his mouth causing his pain and he would like to get a refill on his pain medication until he can see the oral surgeon? Hydrocodone - Ok to refill??       LRF - 01/12/17

## 2017-02-18 NOTE — Telephone Encounter (Signed)
ok 

## 2017-02-19 ENCOUNTER — Telehealth: Payer: Self-pay | Admitting: Family Medicine

## 2017-02-19 MED ORDER — HYDROCODONE-ACETAMINOPHEN 7.5-325 MG PO TABS: 1.0000 | ORAL_TABLET | Freq: Four times a day (QID) | ORAL | 0 refills | Status: DC | PRN

## 2017-02-19 NOTE — Telephone Encounter (Signed)
Patient is going for dental surgery consultation next week,would like to know what he should do about his

## 2017-02-19 NOTE — Telephone Encounter (Signed)
RX printed, left up front and patient aware to pick up via vm 

## 2017-02-28 ENCOUNTER — Other Ambulatory Visit: Payer: Self-pay | Admitting: Family Medicine

## 2017-03-14 ENCOUNTER — Other Ambulatory Visit: Payer: Self-pay | Admitting: Family Medicine

## 2017-03-16 ENCOUNTER — Other Ambulatory Visit: Payer: Self-pay | Admitting: Family Medicine

## 2017-03-16 MED ORDER — CLOPIDOGREL BISULFATE 75 MG PO TABS: 75.0000 mg | ORAL_TABLET | Freq: Every morning | ORAL | 3 refills | Status: DC

## 2017-04-04 ENCOUNTER — Other Ambulatory Visit: Payer: Self-pay | Admitting: Family Medicine

## 2017-04-05 NOTE — Telephone Encounter (Signed)
Medication refilled per protocol. 

## 2017-04-07 ENCOUNTER — Other Ambulatory Visit: Payer: Self-pay

## 2017-04-07 MED ORDER — FENOFIBRATE 48 MG PO TABS: 48.0000 mg | ORAL_TABLET | Freq: Every day | ORAL | 0 refills | Status: DC

## 2017-04-11 ENCOUNTER — Other Ambulatory Visit: Payer: Self-pay | Admitting: Family Medicine

## 2017-04-18 ENCOUNTER — Other Ambulatory Visit: Payer: Self-pay | Admitting: Family Medicine

## 2017-04-19 NOTE — Telephone Encounter (Signed)
rx called into pharmacy

## 2017-04-19 NOTE — Telephone Encounter (Signed)
Last OV 7/30 Last refill 8/1 Ok to refill?

## 2017-04-19 NOTE — Telephone Encounter (Signed)
ok 

## 2017-04-26 ENCOUNTER — Other Ambulatory Visit: Payer: Medicare Other

## 2017-05-01 ENCOUNTER — Encounter (HOSPITAL_COMMUNITY): Payer: Self-pay

## 2017-05-01 ENCOUNTER — Emergency Department (HOSPITAL_COMMUNITY): Payer: Medicare Other

## 2017-05-01 ENCOUNTER — Observation Stay (HOSPITAL_COMMUNITY)
Admission: EM | Admit: 2017-05-01 | Discharge: 2017-05-02 | Disposition: A | Discharge status: Home / Self Care | Payer: Medicare Other | Attending: Internal Medicine | Admitting: Internal Medicine

## 2017-05-01 DIAGNOSIS — I110 Hypertensive heart disease with heart failure: Secondary | ICD-10-CM | POA: Insufficient documentation

## 2017-05-01 DIAGNOSIS — F17210 Nicotine dependence, cigarettes, uncomplicated: Secondary | ICD-10-CM | POA: Diagnosis not present

## 2017-05-01 DIAGNOSIS — R079 Chest pain, unspecified: Principal | ICD-10-CM | POA: Diagnosis present

## 2017-05-01 DIAGNOSIS — I509 Heart failure, unspecified: Secondary | ICD-10-CM | POA: Diagnosis not present

## 2017-05-01 DIAGNOSIS — E119 Type 2 diabetes mellitus without complications: Secondary | ICD-10-CM

## 2017-05-01 DIAGNOSIS — E782 Mixed hyperlipidemia: Secondary | ICD-10-CM | POA: Diagnosis present

## 2017-05-01 DIAGNOSIS — I10 Essential (primary) hypertension: Secondary | ICD-10-CM | POA: Diagnosis present

## 2017-05-01 DIAGNOSIS — Z951 Presence of aortocoronary bypass graft: Secondary | ICD-10-CM | POA: Diagnosis not present

## 2017-05-01 DIAGNOSIS — E876 Hypokalemia: Secondary | ICD-10-CM | POA: Diagnosis not present

## 2017-05-01 DIAGNOSIS — Z955 Presence of coronary angioplasty implant and graft: Secondary | ICD-10-CM | POA: Insufficient documentation

## 2017-05-01 DIAGNOSIS — F17200 Nicotine dependence, unspecified, uncomplicated: Secondary | ICD-10-CM | POA: Diagnosis present

## 2017-05-01 DIAGNOSIS — Z66 Do not resuscitate: Secondary | ICD-10-CM | POA: Insufficient documentation

## 2017-05-01 DIAGNOSIS — I252 Old myocardial infarction: Secondary | ICD-10-CM | POA: Diagnosis not present

## 2017-05-01 DIAGNOSIS — Z87442 Personal history of urinary calculi: Secondary | ICD-10-CM | POA: Insufficient documentation

## 2017-05-01 DIAGNOSIS — Z720 Tobacco use: Secondary | ICD-10-CM | POA: Diagnosis not present

## 2017-05-01 DIAGNOSIS — Z7902 Long term (current) use of antithrombotics/antiplatelets: Secondary | ICD-10-CM | POA: Insufficient documentation

## 2017-05-01 DIAGNOSIS — M1990 Unspecified osteoarthritis, unspecified site: Secondary | ICD-10-CM | POA: Insufficient documentation

## 2017-05-01 DIAGNOSIS — E1165 Type 2 diabetes mellitus with hyperglycemia: Secondary | ICD-10-CM | POA: Insufficient documentation

## 2017-05-01 DIAGNOSIS — R739 Hyperglycemia, unspecified: Secondary | ICD-10-CM | POA: Diagnosis present

## 2017-05-01 DIAGNOSIS — F419 Anxiety disorder, unspecified: Secondary | ICD-10-CM | POA: Insufficient documentation

## 2017-05-01 DIAGNOSIS — I2510 Atherosclerotic heart disease of native coronary artery without angina pectoris: Secondary | ICD-10-CM | POA: Diagnosis present

## 2017-05-01 DIAGNOSIS — Z79899 Other long term (current) drug therapy: Secondary | ICD-10-CM | POA: Diagnosis not present

## 2017-05-01 LAB — BASIC METABOLIC PANEL
ANION GAP: 10 (ref 5–15)
BUN: 9 mg/dL (ref 6–20)
CO2: 22 mmol/L (ref 22–32)
Calcium: 9.3 mg/dL (ref 8.9–10.3)
Chloride: 106 mmol/L (ref 101–111)
Creatinine, Ser: 1.01 mg/dL (ref 0.61–1.24)
GLUCOSE: 239 mg/dL — AB (ref 65–99)
POTASSIUM: 3.4 mmol/L — AB (ref 3.5–5.1)
SODIUM: 138 mmol/L (ref 135–145)

## 2017-05-01 LAB — CBC
HCT: 44.4 % (ref 39.0–52.0)
HEMOGLOBIN: 15 g/dL (ref 13.0–17.0)
MCH: 31.4 pg (ref 26.0–34.0)
MCHC: 33.8 g/dL (ref 30.0–36.0)
MCV: 93.1 fL (ref 78.0–100.0)
Platelets: 256 10*3/uL (ref 150–400)
RBC: 4.77 MIL/uL (ref 4.22–5.81)
RDW: 13.6 % (ref 11.5–15.5)
WBC: 7.5 10*3/uL (ref 4.0–10.5)

## 2017-05-01 LAB — I-STAT TROPONIN, ED: TROPONIN I, POC: 0 ng/mL (ref 0.00–0.08)

## 2017-05-01 LAB — PROTIME-INR
INR: 0.98
PROTHROMBIN TIME: 12.9 s (ref 11.4–15.2)

## 2017-05-01 LAB — D-DIMER, QUANTITATIVE: D-Dimer, Quant: <0.27 ug/mL-FEU (ref 0.00–0.50)

## 2017-05-01 MED ORDER — ONDANSETRON HCL 4 MG/2ML IJ SOLN
4.0000 mg | Freq: Once | INTRAMUSCULAR | Status: DC
  Filled 2017-05-01: qty 2

## 2017-05-01 MED ORDER — MORPHINE SULFATE (PF) 4 MG/ML IV SOLN
4.0000 mg | Freq: Once | INTRAVENOUS | Status: AC
  Administered 2017-05-02: 4 mg via INTRAVENOUS
  Filled 2017-05-01: qty 1

## 2017-05-01 MED ORDER — KETOROLAC TROMETHAMINE 30 MG/ML IJ SOLN
30.0000 mg | Freq: Once | INTRAMUSCULAR | Status: DC
  Filled 2017-05-01: qty 1

## 2017-05-01 NOTE — ED Provider Notes (Signed)
[facility] Provider Note   CSN: [id number] Arrival date & time: 05/01/17  1641  History   Chief Complaint Chief Complaint  Patient presents with  . Chest Pain   HPI [name] is a 41 y.o. male.  HPI  41 y.o. male with a hx of CAD s/p CABG, HTN, HLD and tobacco abuse, presents to the Emergency Department today due to chest pain x 2 weeks. Initially pain was intermittent, but states that the past 3-4 days has been a constant pain States it feels like a pressure on the left side of his chest. Rates 10/10. States it feels like when he had his bypass. Pt with coronary history began in 2009 with an inferior MI requiring RCA PCI and subsequent 4V CABG. He then presented in 2010 with a NSTEMI requiring PCI of the Circumflex. Cardiac cath on 10/19/11 with stable CAD, bypasses. Notes taking SL nitro without change. Denies any trauma or lifting injury. No N/V. No diaphoresis. No headaches. No numbness/tingling. No recent surgeries. No recent travel. No hx DVT/PE. Denies heavy lifting injury. No other symptoms noted.   Past Medical History:  Diagnosis Date  . Anxiety   . Arthritis   . CHF (congestive heart failure) (HCC)   . Complication of anesthesia    difficulty awakening following appendectomy   . Dizziness    with humidity   . Hx of CABG   . Hyperlipidemia   . Hypertension   . Kidney stones   . MYOCARDIAL INFARCTION 10/13/2008   Qualifier: Diagnosis of  By: Juanda Chance, MD, Johny Chess   . Numbness in right leg    pt relates to sciatica  . Tobacco user     Patient Active Problem List   Diagnosis Date Noted  . CAD (coronary artery disease), native coronary artery 11/16/2015  . Precordial chest pain 11/15/2015  . Right kidney stone 08/06/2014  . Low back pain 03/01/2014  . Neck pain 03/01/2014  . History of laparoscopic appendectomy 09/18/2013  . Chest pain with moderate risk of acute coronary syndrome 09/17/2013  . Myalgia and myositis, unspecified 05/29/2013    . Bilateral leg edema 02/21/2013  . Dental caries 01/19/2013  . Angina pectoris (HCC) 12/07/2012  . Dizzy 12/07/2012  . Hypokalemia 12/07/2012  . Sciatica of right side 11/25/2012  . Intervertebral disk disease 07/27/2012  . Obesity, Class II, BMI 35-39.9 03/18/2012  . Anxiety 03/18/2012  . HTN (hypertension) 02/09/2012  . CAD - CABG '09, OM3 stent March 2010, last cath 4/13- medical Rx 10/29/2011  . Hyperlipidemia, mixed 05/30/2008  . TOBACCO USE 05/30/2008    Past Surgical History:  Procedure Laterality Date  . APPENDECTOMY    . CARDIAC CATHETERIZATION    . CORONARY ARTERY BYPASS GRAFT     status post diaphragmatic wall infarction Rx BMS RCA 03-11-08   . CORONARY STENT PLACEMENT    . CYSTOSCOPY WITH RETROGRADE PYELOGRAM, URETEROSCOPY AND STENT PLACEMENT Bilateral 03/16/2015   Procedure: CYSTOSCOPY WITH BILATERAL RETROGRADE PYELOGRAM, RIGHT URETEROSCOPY, STONE BASKETRY WITH LASER LITHOTRIPSY ON THE RIGHT,  AND BILATERAL STENT PLACEMENT;  Surgeon: Malen Gauze, MD;  Location: [facility];  Service: Urology;  Laterality: Bilateral;  . CYSTOSCOPY WITH RETROGRADE PYELOGRAM, URETEROSCOPY AND STENT PLACEMENT Left 04/02/2015   Procedure: CYSTOSCOPY WITH RETROGRADE PYELOGRAM, URETEROSCOPY REMOVAL BILATERAL STENTS;  Surgeon: Malen Gauze, MD;  Location: [facility];  Service: Urology;  Laterality: Left;  . HOLMIUM LASER APPLICATION Right 03/16/2015   Procedure: HOLMIUM LASER APPLICATION;  Surgeon: Malen Gauze,  MD;  Location: [facility];  Service: Urology;  Laterality: Right;  . LAPAROSCOPIC APPENDECTOMY N/A 09/17/2013   Procedure: APPENDECTOMY LAPAROSCOPIC;  Surgeon: Shelly Rubenstein, MD;  Location: [facility];  Service: General;  Laterality: N/A;  . LEFT HEART CATHETERIZATION WITH CORONARY/GRAFT ANGIOGRAM N/A 10/19/2011   Procedure: LEFT HEART CATHETERIZATION WITH Isabel Caprice;  Surgeon: Herby Abraham, MD;  Location: [facility];  Service: Cardiovascular;  Laterality: N/A;  .  ORTHOPEDIC SURGERY    . TONSILLECTOMY         Home Medications    Prior to Admission medications   Medication Sig Start Date End Date Taking? Authorizing Provider  amoxicillin-clavulanate (AUGMENTIN) 875-125 MG tablet Take 1 tablet by mouth 2 (two) times daily. 02/15/17   Donita Brooks, MD  atorvastatin (LIPITOR) 80 MG tablet TAKE 1 TABLET BY MOUTH ONCE DAILY AT  6  PM 01/11/17   Donita Brooks, MD  baclofen (LIORESAL) 10 MG tablet TAKE 1 TABLET BY MOUTH THREE TIMES DAILY 04/12/17   Donita Brooks, MD  clonazePAM (KLONOPIN) 1 MG tablet TAKE 1 TABLET BY MOUTH TWICE DAILY AS NEEDED FOR ANXIETY 04/19/17   Donita Brooks, MD  clopidogrel (PLAVIX) 75 MG tablet Take 1 tablet (75 mg total) by mouth every morning. 03/16/17   Donita Brooks, MD  escitalopram (LEXAPRO) 10 MG tablet TAKE 1 TABLET BY MOUTH ONCE DAILY 03/01/17   Donita Brooks, MD  fenofibrate (TRICOR) 48 MG tablet Take 1 tablet (48 mg total) by mouth daily. 04/07/17   Donita Brooks, MD  gabapentin (NEURONTIN) 300 MG capsule TAKE ONE CAPSULE BY MOUTH THREE TIMES DAILY 12/07/16   Donita Brooks, MD  HYDROcodone-acetaminophen (NORCO) 7.5-325 MG tablet Take 1 tablet by mouth every 6 (six) hours as needed for moderate pain. 02/19/17   Donita Brooks, MD  isosorbide mononitrate (IMDUR) 60 MG 24 hr tablet TAKE 1 TABLET BY MOUTH ONCE DAILY 04/05/17   Donita Brooks, MD  losartan (COZAAR) 100 MG tablet TAKE ONE TABLET BY MOUTH ONCE DAILY 04/12/17   Donita Brooks, MD  metoprolol tartrate (LOPRESSOR) 25 MG tablet TAKE ONE TABLET BY MOUTH TWICE DAILY 11/02/16   Donita Brooks, MD  Multiple Vitamin (MULTI-VITAMIN DAILY PO) Take 1 Dose by mouth daily. [employer] vitamin pack takes 1 packet by mouth daily    [provider]  nitroGLYCERIN (NITROSTAT) 0.4 MG SL tablet Place 1 tablet (0.4 mg total) under the tongue every 5 (five) minutes as needed for chest pain. 09/03/16   Kathleene Hazel, MD  RANEXA 500 MG 12 hr  tablet TAKE 1 TABLET BY MOUTH TWICE DAILY 03/15/17   Donita Brooks, MD    Family History Family History  Problem Relation Age of Onset  . Adopted: Yes  . Cancer Mother   . Diabetes Mother   . Hyperlipidemia Mother   . Hypertension Mother   . Heart failure Mother        Died age 72  . Cancer Sister   . Diabetes Sister   . Hyperlipidemia Sister   . Hypertension Sister     Social History Social History  Substance Use Topics  . Smoking status: Current Every Day Smoker    Packs/day: 0.50    Years: 30.00    Types: Cigarettes  . Smokeless tobacco: Former Neurosurgeon    Types: Snuff    Quit date: 07/20/1994  . Alcohol use 0.0 oz/week     Comment: may have a drink  every few months     Allergies   Celexa [citalopram] and Codeine   Review of Systems Review of Systems ROS reviewed and all are negative for acute change except as noted in the HPI.  Physical Exam Updated Vital Signs BP (!) 146/90 (BP Location: Right Arm)   Pulse 82   Temp 98.4 F (36.9 C) (Oral)   Resp 18   Ht  (1.803 m)   Wt 108.9 kg (240 lb)   SpO2 98%   BMI 33.47 kg/m   Physical Exam  Constitutional: He is oriented to person, place, and time. He appears well-developed and well-nourished. No distress.  HENT:  Head: Normocephalic and atraumatic.  Right Ear: Tympanic membrane, external ear and ear canal normal.  Left Ear: Tympanic membrane, external ear and ear canal normal.  Nose: Nose normal.  Mouth/Throat: Uvula is midline, oropharynx is clear and moist and mucous membranes are normal. No trismus in the jaw. No oropharyngeal exudate, posterior oropharyngeal erythema or tonsillar abscesses.  Eyes: Pupils are equal, round, and reactive to light. EOM are normal.  Neck: Normal range of motion. Neck supple. No tracheal deviation present.  Cardiovascular: Normal rate, regular rhythm, S1 normal, S2 normal, normal heart sounds, intact distal pulses and normal pulses.   Pulmonary/Chest: Effort normal and  breath sounds normal. No respiratory distress. He has no decreased breath sounds. He has no wheezes. He has no rhonchi. He has no rales.  Pt with reproducible chest pain on left anterior chest wall. No palpable or visible deformities.   Abdominal: Normal appearance and bowel sounds are normal. There is no tenderness.  Musculoskeletal: Normal range of motion.  Neurological: He is alert and oriented to person, place, and time.  Skin: Skin is warm and dry.  Psychiatric: He has a normal mood and affect. His speech is normal and behavior is normal. Thought content normal.  Nursing note and vitals reviewed.    ED Treatments / Results  Labs (all labs ordered are listed, but only abnormal results are displayed) Labs Reviewed  BASIC METABOLIC PANEL - Abnormal; Notable for the following:       Result Value   Potassium 3.4 (*)    Glucose, Bld 239 (*)    All other components within normal limits  CBC  PROTIME-INR  D-DIMER, QUANTITATIVE (NOT AT [facility])  I-STAT TROPONIN, ED  I-STAT TROPONIN, ED    EKG  EKG Interpretation [name]       Radiology Dg Chest 2 View  Result Date: 05/01/2017 CLINICAL DATA:  41 y/o  M; chest pain. EXAM: CHEST  2 VIEW COMPARISON:  11/15/2015 chest radiograph FINDINGS: Stable cardiac silhouette. Status post sternotomy and CABG. Clear lungs. No acute osseous abnormality is evident. IMPRESSION: No acute pulmonary process identified. Electronically Signed   By: Mitzi Hansen M.D.   On: 05/01/2017 17:31    Procedures Procedures (including critical care time)  Medications Ordered in ED Medications  morphine 4 MG/ML injection 4 mg (not administered)  ondansetron (ZOFRAN) injection 4 mg (not administered)  ketorolac (TORADOL) 30 MG/ML injection 30 mg (not administered)     Initial Impression / Assessment and Plan / ED Course  I have reviewed the triage vital signs and the nursing notes.  Pertinent labs & imaging results that were available during my  care of the patient were reviewed by me and considered in my medical decision making (see chart for details).  Final Clinical Impressions(s) / ED Diagnoses  {I have reviewed and evaluated the relevant laboratory  values. {I have reviewed and evaluated the relevant imaging studies. {I have interpreted the relevant EKG. {I have reviewed the relevant previous healthcare records.  {I obtained HPI from historian. {Patient discussed with supervising physician.  ED Course:  Assessment: Pt is a 41 y.o. male with a hx of CAD s/p CABG, HTN, HLD and tobacco abuse, presents to the Emergency Department today due to chest pain x 2 weeks. Initially pain was intermittent, but states that the past 3-4 days has been a constant pain States it feels like a pressure on the left side of his chest. Rates 10/10. States it feels like when he had his bypass. Pt with coronary history began in 2009 with an inferior MI requiring RCA PCI and subsequent 4V CABG. He then presented in 2010 with a NSTEMI requiring PCI of the Circumflex. Cardiac cath on 10/19/11 with stable CAD, bypasses. Notes taking SL nitro without change. Denies any trauma or lifting injury. No N/V. No diaphoresis. No headaches. No numbness/tingling. No recent surgeries. No recent travel. No hx DVT/PE. On exam, pt in NAD. Nontoxic/nonseptic appearing. VSS. Afebrile. Lungs CTA. Heart RRR. Abdomen nontender soft. Chest pain appears to be reproducible on left anterior chest wall. Labs unremarkable. EKG unremarkable. Trop negative. CXR unremarkable. D Dimer pending. Given analgesia in ED. Consult placed to Cards due to significant cardiac history. Plan is to Admit for CP rule out.  Disposition/Plan:  Admit Pt acknowledges and agrees with plan  Supervising Physician Loren Racer, MD  Final diagnoses:  Chest pain, unspecified type    New Prescriptions New Prescriptions   No medications on file     Wilber Bihari 05/01/17 [id number]    Loren Racer,  MD 05/04/17 [phone]

## 2017-05-01 NOTE — ED Triage Notes (Signed)
Pt presents with 2 week h/o mid-sternal chest pain.  Initially, pain was intermittent but x 3-4 days, pain has been constant.  +shortness of breath, +headache; reports pain radiates to L arm and L side of neck.

## 2017-05-01 NOTE — Consult Note (Signed)
Reason for Consult: chest pain   Requesting Physician/Service: ED   PCP:  Susy Frizzle, MD Primary Cardiologist: Angelena Form  HPI:  41 YO s/p CABG, HTN, HLD and tobacco abuse, presents to the Emergency Department today due to chest pain x 2 weeks.  CAD history: 2009 with an inferior MI requiring RCA PCI and subsequent 4V CABG. He then presented in 2010 with a NSTEMI requiring PCI of the Circumflex. Cardiac cath on 10/19/11 with stable CAD, bypasses.   On interview, He states the pain is constant for 2 weeks. Lots of life stressors recently, he is unsure if that is factoring in.  Worse with inspiration, palpation.  No N/V, sob, edema or other changes.   Does note some chronic tenderness ever since sternotomy. Pain on palpation of that area.  Has had prior admissions for atypical pain.  Past Medical History:  Diagnosis Date  . Anxiety   . Arthritis   . CHF (congestive heart failure) ([place])   . Complication of anesthesia    difficulty awakening following appendectomy   . Dizziness    with humidity   . Hx of CABG   . Hyperlipidemia   . Hypertension   . Kidney stones   . MYOCARDIAL INFARCTION 10/13/2008   Qualifier: Diagnosis of  By: Olevia Perches, MD, Glenetta Hew   . Numbness in right leg    pt relates to sciatica  . Tobacco user     Past Surgical History:  Procedure Laterality Date  . APPENDECTOMY    . CARDIAC CATHETERIZATION    . CORONARY ARTERY BYPASS GRAFT     status post diaphragmatic wall infarction Rx BMS RCA 03-11-08   . CORONARY STENT PLACEMENT    . CYSTOSCOPY WITH RETROGRADE PYELOGRAM, URETEROSCOPY AND STENT PLACEMENT Bilateral 03/16/2015   Procedure: CYSTOSCOPY WITH BILATERAL RETROGRADE PYELOGRAM, RIGHT URETEROSCOPY, STONE BASKETRY WITH LASER LITHOTRIPSY ON THE RIGHT,  AND BILATERAL STENT PLACEMENT;  Surgeon: Cleon Gustin, MD;  Location: [facility];  Service: Urology;  Laterality: Bilateral;  . CYSTOSCOPY WITH RETROGRADE PYELOGRAM, URETEROSCOPY AND STENT PLACEMENT  Left 04/02/2015   Procedure: CYSTOSCOPY WITH RETROGRADE PYELOGRAM, URETEROSCOPY REMOVAL BILATERAL STENTS;  Surgeon: Cleon Gustin, MD;  Location: [facility];  Service: Urology;  Laterality: Left;  . HOLMIUM LASER APPLICATION Right 4/80/1655   Procedure: HOLMIUM LASER APPLICATION;  Surgeon: Cleon Gustin, MD;  Location: [facility];  Service: Urology;  Laterality: Right;  . LAPAROSCOPIC APPENDECTOMY N/A 09/17/2013   Procedure: APPENDECTOMY LAPAROSCOPIC;  Surgeon: Harl Bowie, MD;  Location: [place];  Service: General;  Laterality: N/A;  . LEFT HEART CATHETERIZATION WITH CORONARY/GRAFT ANGIOGRAM N/A 10/19/2011   Procedure: LEFT HEART CATHETERIZATION WITH Beatrix Fetters;  Surgeon: Hillary Bow, MD;  Location: [facility] CATH [facility];  Service: Cardiovascular;  Laterality: N/A;  . ORTHOPEDIC SURGERY    . TONSILLECTOMY      Family History  Problem Relation Age of Onset  . Adopted: Yes  . Cancer Mother   . Diabetes Mother   . Hyperlipidemia Mother   . Hypertension Mother   . Heart failure Mother        Died age 36  . Cancer Sister   . Diabetes Sister   . Hyperlipidemia Sister   . Hypertension Sister    Social History:  reports that he has been smoking Cigarettes.  He has a 15.00 pack-year smoking history. He quit smokeless tobacco use about 22 years ago. His smokeless tobacco use included Snuff. He reports that he drinks alcohol.  He reports that he does not use drugs.  Allergies:  Allergies  Allergen Reactions  . Celexa [Citalopram] Itching and Rash  . Codeine Itching    No current facility-administered medications on file prior to encounter.    Current Outpatient Prescriptions on File Prior to Encounter  Medication Sig Dispense Refill  . atorvastatin (LIPITOR) 80 MG tablet TAKE 1 TABLET BY MOUTH ONCE DAILY AT  6  PM 90 tablet 0  . baclofen (LIORESAL) 10 MG tablet TAKE 1 TABLET BY MOUTH THREE TIMES DAILY 90 tablet 0  . clonazePAM (KLONOPIN) 1 MG tablet TAKE 1 TABLET BY MOUTH  TWICE DAILY AS NEEDED FOR ANXIETY 60 tablet 1  . clopidogrel (PLAVIX) 75 MG tablet Take 1 tablet (75 mg total) by mouth every morning. 90 tablet 3  . escitalopram (LEXAPRO) 10 MG tablet TAKE 1 TABLET BY MOUTH ONCE DAILY 30 tablet 3  . fenofibrate (TRICOR) 48 MG tablet Take 1 tablet (48 mg total) by mouth daily. 90 tablet 0  . gabapentin (NEURONTIN) 300 MG capsule TAKE ONE CAPSULE BY MOUTH THREE TIMES DAILY 90 capsule 5  . HYDROcodone-acetaminophen (NORCO) 7.5-325 MG tablet Take 1 tablet by mouth every 6 (six) hours as needed for moderate pain. 30 tablet 0  . isosorbide mononitrate (IMDUR) 60 MG 24 hr tablet TAKE 1 TABLET BY MOUTH ONCE DAILY 90 tablet 0  . losartan (COZAAR) 100 MG tablet TAKE ONE TABLET BY MOUTH ONCE DAILY 90 tablet 1  . metoprolol tartrate (LOPRESSOR) 25 MG tablet TAKE ONE TABLET BY MOUTH TWICE DAILY 60 tablet 5  . Multiple Vitamin (MULTI-VITAMIN DAILY PO) Take 1 Dose by mouth daily. [employer] vitamin pack takes 1 packet by mouth daily    . nitroGLYCERIN (NITROSTAT) 0.4 MG SL tablet Place 1 tablet (0.4 mg total) under the tongue every 5 (five) minutes as needed for chest pain. 25 tablet 2  . RANEXA 500 MG 12 hr tablet TAKE 1 TABLET BY MOUTH TWICE DAILY 180 tablet 0    Results for orders placed or performed during the hospital encounter of 05/01/17 (from the past 48 hour(s))  I-stat troponin, ED     Status: None   Collection Time: 05/01/17  5:06 PM  Result Value Ref Range   Troponin i, poc 0.00 0.00 - 0.08 ng/mL   Comment 3            Comment: Due to the release kinetics of cTnI, a negative result within the first hours of the onset of symptoms does not rule out myocardial infarction with certainty. If myocardial infarction is still suspected, repeat the test at appropriate intervals.   Basic metabolic panel     Status: Abnormal   Collection Time: 05/01/17  5:12 PM  Result Value Ref Range   Sodium 138 135 - 145 mmol/L   Potassium 3.4 (L) 3.5 - 5.1 mmol/L   Chloride 106 101 -  111 mmol/L   CO2 22 22 - 32 mmol/L   Glucose, Bld 239 (H) 65 - 99 mg/dL   BUN 9 6 - 20 mg/dL   Creatinine, Ser 1.01 0.61 - 1.24 mg/dL   Calcium 9.3 8.9 - 10.3 mg/dL   GFR calc non Af Amer >60 >60 mL/min   GFR calc Af Amer >60 >60 mL/min    Comment: (NOTE) The eGFR has been calculated using the CKD EPI equation. This calculation has not been validated in all clinical situations. eGFR's persistently <60 mL/min signify possible Chronic Kidney Disease.    Anion gap 10  5 - 15  CBC     Status: None   Collection Time: 05/01/17  5:12 PM  Result Value Ref Range   WBC 7.5 4.0 - 10.5 K/uL   RBC 4.77 4.22 - 5.81 MIL/uL   Hemoglobin 15.0 13.0 - 17.0 g/dL   HCT 44.4 39.0 - 52.0 %   MCV 93.1 78.0 - 100.0 fL   MCH 31.4 26.0 - 34.0 pg   MCHC 33.8 30.0 - 36.0 g/dL   RDW 13.6 11.5 - 15.5 %   Platelets 256 150 - 400 K/uL  Protime-INR (order if Patient is taking Coumadin / Warfarin)     Status: None   Collection Time: 05/01/17  5:12 PM  Result Value Ref Range   Prothrombin Time 12.9 11.4 - 15.2 seconds   INR 0.98    Dg Chest 2 View  Result Date: 05/01/2017 CLINICAL DATA:  41 y/o  M; chest pain. EXAM: CHEST  2 VIEW COMPARISON:  11/15/2015 chest radiograph FINDINGS: Stable cardiac silhouette. Status post sternotomy and CABG. Clear lungs. No acute osseous abnormality is evident. IMPRESSION: No acute pulmonary process identified. Electronically Signed   By: Kristine Garbe M.D.   On: 05/01/2017 17:31    ECG/TELE: sinus with PVC and inferior Q wave  ROS: As above. Otherwise, review of systems is negative unless per above HPI  Vitals:   05/01/17 1645 05/01/17 1648 05/01/17 2003 05/01/17 2118  BP: (!) 120/106  119/75 (!) 146/90  Pulse: 90  67 82  Resp: 18  16 18   Temp: 98.4 F (36.9 C)  98.3 F (36.8 C) 98.4 F (36.9 C)  TempSrc: Oral  Oral Oral  SpO2: 97%  95% 98%  Weight:  108.9 kg (240 lb)    Height:  5' 11"  (1.803 m)     Wt Readings from Last 10 Encounters:  05/01/17 108.9  kg (240 lb)  02/15/17 114.3 kg (252 lb)  12/15/16 118.4 kg (261 lb)  11/13/16 122.5 kg (270 lb)  11/13/16 122.4 kg (269 lb 12.8 oz)  05/12/16 127.9 kg (282 lb)  03/12/16 127.7 kg (281 lb 9.6 oz)  11/15/15 121.8 kg (268 lb 9.6 oz)  11/15/15 121.6 kg (268 lb)  09/09/15 125.2 kg (276 lb)    PE:  General: No acute distress HEENT: Atraumatic, EOMI, mucous membranes moist. No JVD at 45 degrees. No HJR. CV: RRR no murmurs, gallops.  Respiratory: Clear, no crackles. Normal work of breathing ABD: Non-distended and non-tender. No palpable organomegaly.  Extremities: 2+ radial pulses bilaterally. no edema. Neuro/Psych: CN grossly intact, alert and oriented  Assessment/Plan Chest pain CAD s/p CABG Tobacco Abuse HTN HLD Anxiety  No obvious sign of ischemia on ECG or labs.  Pain is atypical sounding.  Reasonable to pursue observation given significant history and consideration of non-cardiac chest pain.  Already on good anti-anginal regimen.  Recommend tobacco cessation.    Lolita Cram Charday Capetillo  MD 05/01/2017, 11:01 PM

## 2017-05-02 ENCOUNTER — Other Ambulatory Visit: Payer: Self-pay

## 2017-05-02 ENCOUNTER — Observation Stay (HOSPITAL_COMMUNITY): Payer: Medicare Other

## 2017-05-02 ENCOUNTER — Encounter (HOSPITAL_COMMUNITY): Payer: Self-pay | Admitting: *Deleted

## 2017-05-02 DIAGNOSIS — Z79899 Other long term (current) drug therapy: Secondary | ICD-10-CM | POA: Diagnosis not present

## 2017-05-02 DIAGNOSIS — R079 Chest pain, unspecified: Secondary | ICD-10-CM | POA: Diagnosis present

## 2017-05-02 DIAGNOSIS — I10 Essential (primary) hypertension: Secondary | ICD-10-CM | POA: Diagnosis not present

## 2017-05-02 DIAGNOSIS — F419 Anxiety disorder, unspecified: Secondary | ICD-10-CM | POA: Diagnosis not present

## 2017-05-02 DIAGNOSIS — I252 Old myocardial infarction: Secondary | ICD-10-CM | POA: Diagnosis not present

## 2017-05-02 DIAGNOSIS — Z951 Presence of aortocoronary bypass graft: Secondary | ICD-10-CM | POA: Diagnosis not present

## 2017-05-02 DIAGNOSIS — R739 Hyperglycemia, unspecified: Secondary | ICD-10-CM | POA: Diagnosis not present

## 2017-05-02 DIAGNOSIS — E876 Hypokalemia: Secondary | ICD-10-CM

## 2017-05-02 DIAGNOSIS — I25119 Atherosclerotic heart disease of native coronary artery with unspecified angina pectoris: Secondary | ICD-10-CM | POA: Diagnosis not present

## 2017-05-02 DIAGNOSIS — R072 Precordial pain: Secondary | ICD-10-CM

## 2017-05-02 DIAGNOSIS — E782 Mixed hyperlipidemia: Secondary | ICD-10-CM

## 2017-05-02 DIAGNOSIS — I2510 Atherosclerotic heart disease of native coronary artery without angina pectoris: Secondary | ICD-10-CM | POA: Diagnosis not present

## 2017-05-02 DIAGNOSIS — Z7902 Long term (current) use of antithrombotics/antiplatelets: Secondary | ICD-10-CM | POA: Diagnosis not present

## 2017-05-02 DIAGNOSIS — F17200 Nicotine dependence, unspecified, uncomplicated: Secondary | ICD-10-CM

## 2017-05-02 DIAGNOSIS — E1165 Type 2 diabetes mellitus with hyperglycemia: Secondary | ICD-10-CM | POA: Diagnosis not present

## 2017-05-02 DIAGNOSIS — E119 Type 2 diabetes mellitus without complications: Secondary | ICD-10-CM

## 2017-05-02 DIAGNOSIS — I509 Heart failure, unspecified: Secondary | ICD-10-CM | POA: Diagnosis not present

## 2017-05-02 DIAGNOSIS — I110 Hypertensive heart disease with heart failure: Secondary | ICD-10-CM | POA: Diagnosis not present

## 2017-05-02 LAB — GLUCOSE, CAPILLARY
GLUCOSE-CAPILLARY: 122 mg/dL — AB (ref 65–99)
GLUCOSE-CAPILLARY: 122 mg/dL — AB (ref 65–99)
Glucose-Capillary: 104 mg/dL — ABNORMAL HIGH (ref 65–99)

## 2017-05-02 LAB — MAGNESIUM: Magnesium: 1.7 mg/dL (ref 1.7–2.4)

## 2017-05-02 LAB — TROPONIN I

## 2017-05-02 LAB — HEMOGLOBIN A1C
Hgb A1c MFr Bld: 6 % — ABNORMAL HIGH (ref 4.8–5.6)
Mean Plasma Glucose: 125.5 mg/dL

## 2017-05-02 LAB — I-STAT TROPONIN, ED: Troponin i, poc: 0.01 ng/mL (ref 0.00–0.08)

## 2017-05-02 LAB — HIV ANTIBODY (ROUTINE TESTING W REFLEX): HIV SCREEN 4TH GENERATION: NONREACTIVE

## 2017-05-02 MED ORDER — KETOROLAC TROMETHAMINE 60 MG/2ML IM SOLN
30.0000 mg | Freq: Once | INTRAMUSCULAR | Status: AC
  Administered 2017-05-02: 30 mg via INTRAMUSCULAR
  Filled 2017-05-02: qty 2

## 2017-05-02 MED ORDER — LOSARTAN POTASSIUM 50 MG PO TABS
100.0000 mg | ORAL_TABLET | Freq: Every day | ORAL | Status: DC
  Administered 2017-05-02: 100 mg via ORAL
  Filled 2017-05-02: qty 2

## 2017-05-02 MED ORDER — CLONAZEPAM 0.5 MG PO TABS: 1.0000 mg | ORAL_TABLET | Freq: Two times a day (BID) | ORAL | Status: DC | PRN

## 2017-05-02 MED ORDER — ESCITALOPRAM OXALATE 10 MG PO TABS
10.0000 mg | ORAL_TABLET | Freq: Every day | ORAL | Status: DC
Start: 2017-05-02 — End: 2017-05-02
  Administered 2017-05-02: 10 mg via ORAL
  Filled 2017-05-02: qty 1

## 2017-05-02 MED ORDER — HYDROCODONE-ACETAMINOPHEN 7.5-325 MG PO TABS: 1.0000 | ORAL_TABLET | Freq: Four times a day (QID) | ORAL | Status: DC | PRN

## 2017-05-02 MED ORDER — OMEGA-3-ACID ETHYL ESTERS 1 G PO CAPS: 1.0000 g | ORAL_CAPSULE | Freq: Two times a day (BID) | ORAL | 0 refills | Status: DC

## 2017-05-02 MED ORDER — ENOXAPARIN SODIUM 40 MG/0.4ML ~~LOC~~ SOLN
40.0000 mg | SUBCUTANEOUS | Status: DC
  Administered 2017-05-02: 40 mg via SUBCUTANEOUS
  Filled 2017-05-02: qty 0.4

## 2017-05-02 MED ORDER — ONDANSETRON HCL 4 MG/2ML IJ SOLN: 4.0000 mg | Freq: Four times a day (QID) | INTRAMUSCULAR | Status: DC | PRN

## 2017-05-02 MED ORDER — GABAPENTIN 300 MG PO CAPS
300.0000 mg | ORAL_CAPSULE | Freq: Three times a day (TID) | ORAL | Status: DC
  Administered 2017-05-02: 300 mg via ORAL
  Filled 2017-05-02: qty 1

## 2017-05-02 MED ORDER — ONDANSETRON 4 MG PO TBDP
4.0000 mg | ORAL_TABLET | Freq: Once | ORAL | Status: AC
  Administered 2017-05-02: 4 mg via ORAL
  Filled 2017-05-02: qty 1

## 2017-05-02 MED ORDER — OXYCODONE-ACETAMINOPHEN 5-325 MG PO TABS
1.0000 | ORAL_TABLET | Freq: Once | ORAL | Status: AC
  Administered 2017-05-02: 1 via ORAL
  Filled 2017-05-02: qty 1

## 2017-05-02 MED ORDER — POTASSIUM CHLORIDE CRYS ER 20 MEQ PO TBCR
40.0000 meq | EXTENDED_RELEASE_TABLET | Freq: Once | ORAL | Status: AC
  Administered 2017-05-02: 40 meq via ORAL
  Filled 2017-05-02: qty 2

## 2017-05-02 MED ORDER — RANOLAZINE ER 500 MG PO TB12
500.0000 mg | ORAL_TABLET | Freq: Two times a day (BID) | ORAL | Status: DC
  Administered 2017-05-02: 500 mg via ORAL
  Filled 2017-05-02: qty 1

## 2017-05-02 MED ORDER — ACETAMINOPHEN 325 MG PO TABS: 650.0000 mg | ORAL_TABLET | ORAL | Status: DC | PRN

## 2017-05-02 MED ORDER — ATORVASTATIN CALCIUM 80 MG PO TABS: 80.0000 mg | ORAL_TABLET | Freq: Every day | ORAL | Status: DC

## 2017-05-02 MED ORDER — ISOSORBIDE MONONITRATE ER 60 MG PO TB24
60.0000 mg | ORAL_TABLET | Freq: Every day | ORAL | Status: DC
  Administered 2017-05-02: 60 mg via ORAL
  Filled 2017-05-02: qty 1

## 2017-05-02 MED ORDER — INSULIN ASPART 100 UNIT/ML ~~LOC~~ SOLN
0.0000 [IU] | SUBCUTANEOUS | Status: DC
  Administered 2017-05-02: 1 [IU] via SUBCUTANEOUS

## 2017-05-02 MED ORDER — OMEGA-3-ACID ETHYL ESTERS 1 G PO CAPS
1.0000 g | ORAL_CAPSULE | Freq: Two times a day (BID) | ORAL | Status: DC
  Administered 2017-05-02: 1 g via ORAL
  Filled 2017-05-02: qty 1

## 2017-05-02 MED ORDER — METOPROLOL TARTRATE 25 MG PO TABS
25.0000 mg | ORAL_TABLET | Freq: Two times a day (BID) | ORAL | Status: DC
  Administered 2017-05-02: 25 mg via ORAL
  Filled 2017-05-02: qty 1

## 2017-05-02 MED ORDER — CLOPIDOGREL BISULFATE 75 MG PO TABS
75.0000 mg | ORAL_TABLET | Freq: Every morning | ORAL | Status: DC
  Administered 2017-05-02: 75 mg via ORAL
  Filled 2017-05-02: qty 1

## 2017-05-02 MED ORDER — INDOMETHACIN 25 MG PO CAPS: 25.0000 mg | ORAL_CAPSULE | Freq: Three times a day (TID) | ORAL | 0 refills | Status: DC

## 2017-05-02 MED ORDER — MORPHINE SULFATE (PF) 2 MG/ML IV SOLN
2.0000 mg | INTRAVENOUS | Status: DC | PRN
  Administered 2017-05-02: 2 mg via INTRAVENOUS
  Filled 2017-05-02: qty 1

## 2017-05-02 NOTE — Progress Notes (Addendum)
Progress Note  Patient Name: [name] Date of Encounter: 05/02/2017  Primary Cardiologist: Sanjuana Kava  Subjective   Chest pain has improved; patient denies any exertional symptoms.  At times his chest pain can linger for up to a at a time and is constant.  Inpatient Medications    Scheduled Meds: . atorvastatin  80 mg Oral q1800  . clopidogrel  75 mg Oral q morning - 10a  . enoxaparin (LOVENOX) injection  40 mg Subcutaneous Q24H  . escitalopram  10 mg Oral Daily  . gabapentin  300 mg Oral TID  . insulin aspart  0-9 Units Subcutaneous Q4H  . isosorbide mononitrate  60 mg Oral Daily  . losartan  100 mg Oral Daily  . metoprolol tartrate  25 mg Oral BID  . ranolazine  500 mg Oral BID   Continuous Infusions:  PRN Meds: acetaminophen, clonazePAM, HYDROcodone-acetaminophen, morphine injection, ondansetron (ZOFRAN) IV   Vital Signs    Vitals:   05/02/17 0115 05/02/17 0145 05/02/17 0200 05/02/17 0324  BP: 114/81 100/69 116/85 116/74  Pulse: 63 63 68 68  Resp: Temp:    97.7 F (36.5 C)  TempSrc:    Oral  SpO2: 96% 94% 91% 96%  Weight:    248 lb 11.2 oz (112.8 kg)  Height:     (1.778 m)   No intake or output data in the 24 hours ending 05/02/17 0949  I/O since admission: n/a  Filed Weights   05/01/17 1648 05/02/17 0324  Weight: 240 lb (108.9 kg) 248 lb 11.2 oz (112.8 kg)    Telemetry    Inus rhythm- Personally Reviewed  ECG    ECG (independently read by me): normal sinus rhythm at 61 bpm.  Small Q waves in leads 3 and aVF.  Nondiagnostic T-wave change in lead V2.  Normal intervals.  Physical Exam   BP 116/74 (BP Location: Right Arm)   Pulse 68   Temp 97.7 F (36.5 C) (Oral)   Resp 15   Ht  (1.778 m)   Wt 248 lb 11.2 oz (112.8 kg) Comment: pt has jeans on  SpO2 96%   BMI 35.68 kg/m  General: Alert, oriented, no distress.  Skin: tattoos over his entire body HEENT: Normocephalic, atraumatic. Pupils equal round and  reactive to light; sclera anicteric; extraocular muscles intact;  Large ear piercing bilaterally Nose without nasal septal hypertrophy Mouth/Parynx benign; Mallinpatti scale 3 Neck: No JVD, no carotid bruits; normal carotid upstroke Lungs: clear to ausculatation and percussion; no wheezing or rales Chest wall: definite chest wall tenderness to palpation over costochondral region Heart: PMI not displaced, RRR, s1 s2 normal, 1/6 systolic murmur, no diastolic murmur, no rubs, gallops, thrills, or heaves Abdomen: mild central adiposity; soft, nontender; no hepatosplenomehaly, BS+; abdominal aorta nontender and not dilated by palpation. Back: no CVA tenderness Pulses 2+ Musculoskeletal: full range of motion, normal strength, no joint deformities Extremities: no clubbing cyanosis or edema, Homan's sign negative  Neurologic: grossly nonfocal; Cranial nerves grossly wnl Psychologic: Flat affect   Labs    Chemistry Recent Labs Lab 05/01/17 1712  NA 138  K 3.4*  CL 106  CO2 22  GLUCOSE 239*  BUN 9  CREATININE 1.01  CALCIUM 9.3  GFRNONAA >60  GFRAA >60  ANIONGAP 10     Hematology Recent Labs Lab 05/01/17 1712  WBC 7.5  RBC 4.77  HGB 15.0  HCT 44.4  MCV 93.1  MCH 31.4  MCHC 33.8  RDW 13.6  PLT 256    Cardiac Enzymes Recent Labs Lab 05/02/17 0146  TROPONINI <0.03    Recent Labs Lab 05/01/17 1706 05/02/17 0154  TROPIPOC 0.00 0.01     BNPNo results for input(s): BNP, PROBNP in the last 168 hours.   DDimer  Recent Labs Lab 05/01/17 1700  DDIMER <0.27     Lipid Panel     Component Value Date/Time   CHOL 188 05/12/2016 1133   TRIG 334 (H) 05/12/2016 1133   HDL 42 05/12/2016 1133   CHOLHDL 4.5 05/12/2016 1133   VLDL 67 (H) 05/12/2016 1133   LDLCALC 79 05/12/2016 1133   LDLDIRECT 103.2 06/15/2012 1446    Radiology    Dg Chest 2 View  Result Date: 05/01/2017 CLINICAL DATA:  41 y/o  M; chest pain. EXAM: CHEST  2 VIEW COMPARISON:  11/15/2015 chest  radiograph FINDINGS: Stable cardiac silhouette. Status post sternotomy and CABG. Clear lungs. No acute osseous abnormality is evident. IMPRESSION: No acute pulmonary process identified. Electronically Signed   By: Mitzi Hansen M.D.   On: 05/01/2017 17:31    Cardiac Studies   Echo pending  Patient Profile     41 y.o. male who underwent CABG surgery in 2009 with stenting of his circumflex marginal vessel in 2010.  Cardiac consultation was obtained for evaluation of atypical chest pain.  Assessment & Plan    1. CAD: The patient is 9 years status post CABG revascularization surgery at age 11, the status post stenting to OM3 vessel in March 2010. Last catheterization in 2013 with medical therapy recommendation.  2.  Chest pain: The patient's chest pain is noncardiac in etiology.  He is clear-cut costochondral tenderness on exam.  The patient denies any exertional component.  He walks his hound dogs without difficulty.  The pain sometimes can linger the front of his chest for up to a day at a time and is  nonexertional.  Consider nonsteroidal anti-inflammatory therapy.   He denies ever having a stress test  since his bypass.  Ultimately, outpatient exercise nuclear imaging may be worthwhile to assess graft patency. Troponins are negative 2.  ECG was nonischemic.   3.  Mixed hyperlipidemia.  His last lipid panel 2017 showed elevated triglycerides and VLDL with LDL at 79.  Pattern is consistent with an atherogenic profile and consistent with his new-onset diabetes. Continue with high potency statin therapy with atorvastatin 80 mg; will add omega-3 fatty acids such as lovaza or vascepa.  4. Probable new diagnosis of type 2 diabetes mellitus  I suspect this may have been present for some time. Agree with diabetes coordinator consult and HA1c.  5. Ongoing tobacco use.  Previously he had smoked one pack per day but now admits to 1 pack per week.  Complete smoking cessation was discussed and  counseled.  6. Hypokalemia: Potassium 3.4.  Replete to 4  Signed, Lennette Bihari, MD, [facility] 05/02/2017, 9:49 AM

## 2017-05-02 NOTE — Discharge Summary (Signed)
Physician Discharge Summary  [name] ZOX:[id number] DOB: Feb 15, 1976 DOA: 05/01/2017  PCP: Donita Brooks, MD  Admit date: 05/01/2017 Discharge date: 05/02/2017  Admitted From: home Disposition:  home  Recommendations for Outpatient Follow-up:  1. Follow up with PCP in 1week with repeat BMP 2. Follow-up with cardiology in 1-2 weeks  Home Health: no  Equipment/Devices: none  Discharge Condition: Stable  CODE STATUS: full  Diet recommendation: Heart Healthy / Carb Modified   Brief/Interim Summary: 41 year old male with history of coronary disease status post CABG, hypertension, hyperlipidemia and tobacco abuse presented with chest pain. Chest pain was a typical and mostly musculoskeletal in nature. Troponins have been negative. Currently patient is chest pain-free. Cardiology has evaluated the patient and cleared the patient for discharge. Patient will be discharged on oral NSAIDs for probable musculoskeletal pain.  Discharge Diagnoses:  Active Problems:   Hyperlipidemia, mixed   TOBACCO USE   CAD - CABG '09, OM3 stent March 2010, last cath 4/13- medical Rx   HTN (hypertension)   Hypokalemia   Chest pain with moderate risk of acute coronary syndrome   Chest pain   Hyperglycemia   New onset type 2 diabetes mellitus (HCC)  Chest pain - Probably musculoskeletal in origin as patient has costochondral tenderness. Cardiology evaluation appreciated. Cardiology has cleared the patient for discharge. Discharge home on indomethacin. Patient does not need 2-D echo prior to discharge as per cardiology. - Currently chest pain-free. Troponins negative.  History of coronary artery disease status post CABG - outpatient follow-up with cardiology. Continue Lipitor, Plavix, metoprolol, losartan, Imdur and Ranexa  Hyperlipidemia - Continue statin and TriCor. Lovaza added by cardiology which will be continued  Hypertension - Continue metoprolol, losartan and Imdur. Outpatient  follow-up  Hyperglycemia - Hemoglobin A1c of 6. Carb modified diet. Outpatient follow-up  Ongoing tobacco abuse - counseled about tobacco cessation  Discharge Instructions  Discharge Instructions    Ambulatory referral to Cardiology    Complete by:  As directed    Call MD for:  difficulty breathing, headache or visual disturbances    Complete by:  As directed    Call MD for:  extreme fatigue    Complete by:  As directed    Call MD for:  hives    Complete by:  As directed    Call MD for:  persistant dizziness or light-headedness    Complete by:  As directed    Call MD for:  persistant nausea and vomiting    Complete by:  As directed    Call MD for:  severe uncontrolled pain    Complete by:  As directed    Call MD for:  temperature >100.4    Complete by:  As directed    Diet - low sodium heart healthy    Complete by:  As directed    Diet Carb Modified    Complete by:  As directed    Increase activity slowly    Complete by:  As directed      Allergies as of 05/02/2017      Reactions   Celexa [citalopram] Itching, Rash   Codeine Itching      Medication List    TAKE these medications   atorvastatin 80 MG tablet Commonly known as:  LIPITOR TAKE 1 TABLET BY MOUTH ONCE DAILY AT  6  PM   baclofen 10 MG tablet Commonly known as:  LIORESAL TAKE 1 TABLET BY MOUTH THREE TIMES DAILY   clonazePAM 1 MG tablet Commonly known  as:  KLONOPIN TAKE 1 TABLET BY MOUTH TWICE DAILY AS NEEDED FOR ANXIETY   clopidogrel 75 MG tablet Commonly known as:  PLAVIX Take 1 tablet (75 mg total) by mouth every morning.   escitalopram 10 MG tablet Commonly known as:  LEXAPRO TAKE 1 TABLET BY MOUTH ONCE DAILY   fenofibrate 48 MG tablet Commonly known as:  TRICOR Take 1 tablet (48 mg total) by mouth daily.   gabapentin 300 MG capsule Commonly known as:  NEURONTIN TAKE ONE CAPSULE BY MOUTH THREE TIMES DAILY   HYDROcodone-acetaminophen 7.5-325 MG tablet Commonly known as:  NORCO Take 1  tablet by mouth every 6 (six) hours as needed for moderate pain.   indomethacin 25 MG capsule Commonly known as:  INDOCIN Take 1 capsule (25 mg total) by mouth 3 (three) times daily with meals.   isosorbide mononitrate 60 MG 24 hr tablet Commonly known as:  IMDUR TAKE 1 TABLET BY MOUTH ONCE DAILY   losartan 100 MG tablet Commonly known as:  COZAAR TAKE ONE TABLET BY MOUTH ONCE DAILY   metoprolol tartrate 25 MG tablet Commonly known as:  LOPRESSOR TAKE ONE TABLET BY MOUTH TWICE DAILY   MULTI-VITAMIN DAILY PO Take 1 Dose by mouth daily. [employer] vitamin pack takes 1 packet by mouth daily   nitroGLYCERIN 0.4 MG SL tablet Commonly known as:  NITROSTAT Place 1 tablet (0.4 mg total) under the tongue every 5 (five) minutes as needed for chest pain.   omega-3 acid ethyl esters 1 g capsule Commonly known as:  LOVAZA Take 1 capsule (1 g total) by mouth 2 (two) times daily.   RANEXA 500 MG 12 hr tablet Generic drug:  ranolazine TAKE 1 TABLET BY MOUTH TWICE DAILY      Follow-up Information    Donita Brooks, MD. Schedule an appointment as soon as possible for a visit in 1 week(s).   Specialty:  Family Medicine Why:  with repeat BMP Contact information: [address] [place] [address] [phone]        Kathleene Hazel, MD. Schedule an appointment as soon as possible for a visit in 2 week(s).   Specialty:  Cardiology Contact information: [address] [place] [address] [phone]          Allergies  Allergen Reactions  . Celexa [Citalopram] Itching and Rash  . Codeine Itching    Consultations: cardiology  Procedures/Studies: Dg Chest 2 View  Result Date: 05/01/2017 CLINICAL DATA:  41 y/o  M; chest pain. EXAM: CHEST  2 VIEW COMPARISON:  11/15/2015 chest radiograph FINDINGS: Stable cardiac silhouette. Status post sternotomy and CABG. Clear lungs. No acute osseous abnormality is evident. IMPRESSION: No acute pulmonary  process identified. Electronically Signed   By: Mitzi Hansen M.D.   On: 05/01/2017 17:31     Subjective: Patient seen and examined at bedside. He currently denies any overnight fever, nausea or vomiting. No current chest pain  Discharge Exam: Vitals:   05/02/17 0324 05/02/17 1033  BP: 116/74 104/62  Pulse: 68 71  Resp: 15   Temp: 97.7 F (36.5 C)   SpO2: 96%    Vitals:   05/02/17 0145 05/02/17 0200 05/02/17 0324 05/02/17 1033  BP: 100/69 116/85 116/74 104/62  Pulse: 63 68 68 71  Resp: Temp:   97.7 F (36.5 C)   TempSrc:   Oral   SpO2: 94% 91% 96%   Weight:   112.8 kg (248 lb  11.2 oz)   Height:    (1.778 m)     General: Pt is alert, awake, not in acute distress; chest wall tenderness on palpation over the costochondral region Cardiovascular: rate controlled, S1/S2 +, no rubs, no gallops Respiratory: bilateral decreased breath sounds at bases Abdominal: Soft, NT, ND, bowel sounds + Extremities: no edema, no cyanosis    The results of significant diagnostics from this hospitalization (including imaging, microbiology, ancillary and laboratory) are listed below for reference.     Microbiology: No results found for this or any previous visit (from the past 240 hour(s)).   Labs: BNP (last 3 results) No results for input(s): BNP in the last 8760 hours. Basic Metabolic Panel:  Recent Labs Lab 05/01/17 1712 05/02/17 0146  NA 138  --   K 3.4*  --   CL 106  --   CO2 22  --   GLUCOSE 239*  --   BUN 9  --   CREATININE 1.01  --   CALCIUM 9.3  --   MG  --  1.7   Liver Function Tests: No results for input(s): AST, ALT, ALKPHOS, BILITOT, PROT, ALBUMIN in the last 168 hours. No results for input(s): LIPASE, AMYLASE in the last 168 hours. No results for input(s): AMMONIA in the last 168 hours. CBC:  Recent Labs Lab 05/01/17 1712  WBC 7.5  HGB 15.0  HCT 44.4  MCV 93.1  PLT 256   Cardiac Enzymes:  Recent Labs Lab 05/02/17 0146  05/02/17 0727  TROPONINI <0.03 <0.03   BNP: Invalid input(s): POCBNP CBG:  Recent Labs Lab 05/02/17 0407 05/02/17 0750  GLUCAP 122* 104*   D-Dimer  Recent Labs  05/01/17 1700  DDIMER <0.27   Hgb A1c  Recent Labs  05/02/17 0727  HGBA1C 6.0*   Lipid Profile No results for input(s): CHOL, HDL, LDLCALC, TRIG, CHOLHDL, LDLDIRECT in the last 72 hours. Thyroid function studies No results for input(s): TSH, T4TOTAL, T3FREE, THYROIDAB in the last 72 hours.  Invalid input(s): FREET3 Anemia work up No results for input(s): VITAMINB12, FOLATE, FERRITIN, TIBC, IRON, RETICCTPCT in the last 72 hours. Urinalysis    Component Value Date/Time   COLORURINE AMBER (A) 03/16/2015 0334   APPEARANCEUR CLOUDY (A) 03/16/2015 0334   LABSPEC 1.026 03/16/2015 0334   PHURINE 5.5 03/16/2015 0334   GLUCOSEU NEGATIVE 03/16/2015 0334   HGBUR LARGE (A) 03/16/2015 0334   BILIRUBINUR SMALL (A) 03/16/2015 0334   KETONESUR NEGATIVE 03/16/2015 0334   PROTEINUR NEGATIVE 03/16/2015 0334   UROBILINOGEN 0.2 03/16/2015 0334   NITRITE NEGATIVE 03/16/2015 0334   LEUKOCYTESUR SMALL (A) 03/16/2015 0334   Sepsis Labs Invalid input(s): PROCALCITONIN,  WBC,  LACTICIDVEN Microbiology No results found for this or any previous visit (from the past 240 hour(s)).   Time coordinating discharge: 30 minutes  SIGNED:   Glade Lloyd, MD  [facility] 05/02/2017, 11:01 AM Pager: [phone]  If 7PM-7AM, please contact night-coverage [email or web address] Password [email or web address]

## 2017-05-02 NOTE — H&P (Signed)
[name]:[id number] DOB: [birth year] DOA: 05/01/2017     PCP: Donita Brooks, MD   Outpatient Specialists: Pearletha Alfred Patient coming from:   home Lives  With friends    Chief Complaint: Chest pain   HPI: [name] is a 41 y.o. male with medical history significant of CAD s/p CABG, HTN, HLD and tobacco abuse     Presented with  Chest pain for the past 2 days feels like electric pain going to left shoulder. Have been feeling light headed.  lost 20 lb but was trying have not had blurred vision no travel, no leg edema, reports some nausea no vomiting no SOB   Regarding pertinent Chronic problems: CAD   IN ER:  Temp (24hrs), Avg:98.4 F (36.9 C), Min:98.3 F (36.8 C), Max:98.4 F (36.9 C)      on arrival  ED Triage Vitals  Enc Vitals Group     BP 05/01/17 1645 (!) 120/106     Pulse Rate 05/01/17 1645 90     Resp 05/01/17 1645 18     Temp 05/01/17 1645 98.4 F (36.9 C)     Temp Source 05/01/17 1645 Oral     SpO2 05/01/17 1645 97 %     Weight 05/01/17 1648 240 lb (108.9 kg)     Height 05/01/17 1648  (1.803 m)     Head Circumference --      Peak Flow --      Pain Score 05/01/17 1645 9     Pain Loc --      Pain Edu? --      Excl. in GC? --     Latest  RR10 96& Hr 63 Bp 114/81 Na 138 K 3.4 BG 239 Ag 10 WBC 7.5 Hg 15 plt 256 INR 0.98 D.dimer Following Medications were ordered in ER: Medications  morphine 4 MG/ML injection 4 mg (not administered)  oxyCODONE-acetaminophen (PERCOCET/ROXICET) 5-325 MG per tablet 1 tablet (not administered)  ketorolac (TORADOL) injection 30 mg (not administered)  ondansetron (ZOFRAN-ODT) disintegrating tablet 4 mg (not administered)     ER provider discussed case with:  Cardiology Who recommends:admission to medicine cycle CE We'll see patient in consult in the ER   Hospitalist was called for admission for Chest pain evaluation  Review of Systems:    Pertinent positives include:  chest pain,  dizziness,  Constitutional:  No weight loss, night sweats, Fevers, chills, fatigue, weight loss  HEENT:  No headaches, Difficulty swallowing,Tooth/dental problems,Sore throat,  No sneezing, itching, ear ache, nasal congestion, post nasal drip,  Cardio-vascular:  No, Orthopnea, PND, anasarca,  palpitations.no Bilateral lower extremity swelling  GI:  No heartburn, indigestion, abdominal pain, nausea, vomiting, diarrhea, change in bowel habits, loss of appetite, melena, blood in stool, hematemesis Resp:  no shortness of breath at rest. No dyspnea on exertion, No excess mucus, no productive cough, No non-productive cough, No coughing up of blood.No change in color of mucus.No wheezing. Skin:  no rash or lesions. No jaundice GU:  no dysuria, change in color of urine, no urgency or frequency. No straining to urinate.  No flank pain.  Musculoskeletal:  No joint pain or no joint swelling. No decreased range of motion. No back pain.  Psych:  No change in mood or affect. No depression or anxiety. No memory loss.  Neuro: no localizing neurological complaints, no tingling, no weakness, no double vision, no gait abnormality, no slurred speech, no confusion  As per HPI otherwise 10 point review  of systems negative.   Past Medical History: Past Medical History:  Diagnosis Date  . Anxiety   . Arthritis   . CHF (congestive heart failure) (HCC)   . Complication of anesthesia    difficulty awakening following appendectomy   . Dizziness    with humidity   . Hx of CABG   . Hyperlipidemia   . Hypertension   . Kidney stones   . MYOCARDIAL INFARCTION 10/13/2008   Qualifier: Diagnosis of  By: Juanda Chance, MD, Johny Chess   . Numbness in right leg    pt relates to sciatica  . Tobacco user    Past Surgical History:  Procedure Laterality Date  . APPENDECTOMY    . CARDIAC CATHETERIZATION    . CORONARY ARTERY BYPASS GRAFT     status post diaphragmatic wall infarction Rx BMS RCA 03-11-08   .  CORONARY STENT PLACEMENT    . CYSTOSCOPY WITH RETROGRADE PYELOGRAM, URETEROSCOPY AND STENT PLACEMENT Bilateral 03/16/2015   Procedure: CYSTOSCOPY WITH BILATERAL RETROGRADE PYELOGRAM, RIGHT URETEROSCOPY, STONE BASKETRY WITH LASER LITHOTRIPSY ON THE RIGHT,  AND BILATERAL STENT PLACEMENT;  Surgeon: Malen Gauze, MD;  Location: [facility];  Service: Urology;  Laterality: Bilateral;  . CYSTOSCOPY WITH RETROGRADE PYELOGRAM, URETEROSCOPY AND STENT PLACEMENT Left 04/02/2015   Procedure: CYSTOSCOPY WITH RETROGRADE PYELOGRAM, URETEROSCOPY REMOVAL BILATERAL STENTS;  Surgeon: Malen Gauze, MD;  Location: [facility];  Service: Urology;  Laterality: Left;  . HOLMIUM LASER APPLICATION Right 03/16/2015   Procedure: HOLMIUM LASER APPLICATION;  Surgeon: Malen Gauze, MD;  Location: [facility];  Service: Urology;  Laterality: Right;  . LAPAROSCOPIC APPENDECTOMY N/A 09/17/2013   Procedure: APPENDECTOMY LAPAROSCOPIC;  Surgeon: Shelly Rubenstein, MD;  Location: [facility];  Service: General;  Laterality: N/A;  . LEFT HEART CATHETERIZATION WITH CORONARY/GRAFT ANGIOGRAM N/A 10/19/2011   Procedure: LEFT HEART CATHETERIZATION WITH Isabel Caprice;  Surgeon: Herby Abraham, MD;  Location: [facility] CATH LAB;  Service: Cardiovascular;  Laterality: N/A;  . ORTHOPEDIC SURGERY    . TONSILLECTOMY       Social History:  Ambulatory independently     reports that he has been smoking Cigarettes.  He has a 15.00 pack-year smoking history. He quit smokeless tobacco use about 22 years ago. His smokeless tobacco use included Snuff. He reports that he drinks alcohol. He reports that he does not use drugs.  Allergies:   Allergies  Allergen Reactions  . Celexa [Citalopram] Itching and Rash  . Codeine Itching       Family History:   Family History  Problem Relation Age of Onset  . Adopted: Yes  . Cancer Mother   . Diabetes Mother   . Hyperlipidemia Mother   . Hypertension Mother   . Heart failure Mother        Died age  104  . Cancer Sister   . Diabetes Sister   . Hyperlipidemia Sister   . Hypertension Sister     Medications: Prior to Admission medications   Medication Sig Start Date End Date Taking? Authorizing Provider  atorvastatin (LIPITOR) 80 MG tablet TAKE 1 TABLET BY MOUTH ONCE DAILY AT  6  PM 01/11/17  Yes Donita Brooks, MD  baclofen (LIORESAL) 10 MG tablet TAKE 1 TABLET BY MOUTH THREE TIMES DAILY 04/12/17  Yes Donita Brooks, MD  clonazePAM (KLONOPIN) 1 MG tablet TAKE 1 TABLET BY MOUTH TWICE DAILY AS NEEDED FOR ANXIETY 04/19/17  Yes Donita Brooks, MD  clopidogrel (PLAVIX) 75 MG tablet Take 1 tablet (  75 mg total) by mouth every morning. 03/16/17  Yes Donita Brooks, MD  escitalopram (LEXAPRO) 10 MG tablet TAKE 1 TABLET BY MOUTH ONCE DAILY 03/01/17  Yes Donita Brooks, MD  fenofibrate (TRICOR) 48 MG tablet Take 1 tablet (48 mg total) by mouth daily. 04/07/17  Yes Donita Brooks, MD  gabapentin (NEURONTIN) 300 MG capsule TAKE ONE CAPSULE BY MOUTH THREE TIMES DAILY 12/07/16  Yes Donita Brooks, MD  HYDROcodone-acetaminophen (NORCO) 7.5-325 MG tablet Take 1 tablet by mouth every 6 (six) hours as needed for moderate pain. 02/19/17  Yes Donita Brooks, MD  isosorbide mononitrate (IMDUR) 60 MG 24 hr tablet TAKE 1 TABLET BY MOUTH ONCE DAILY 04/05/17  Yes Donita Brooks, MD  losartan (COZAAR) 100 MG tablet TAKE ONE TABLET BY MOUTH ONCE DAILY 04/12/17  Yes Donita Brooks, MD  metoprolol tartrate (LOPRESSOR) 25 MG tablet TAKE ONE TABLET BY MOUTH TWICE DAILY 11/02/16  Yes Donita Brooks, MD  Multiple Vitamin (MULTI-VITAMIN DAILY PO) Take 1 Dose by mouth daily. [employer] vitamin pack takes 1 packet by mouth daily   Yes [provider]  nitroGLYCERIN (NITROSTAT) 0.4 MG SL tablet Place 1 tablet (0.4 mg total) under the tongue every 5 (five) minutes as needed for chest pain. 09/03/16  Yes Kathleene Hazel, MD  RANEXA 500 MG 12 hr tablet TAKE 1 TABLET BY MOUTH TWICE DAILY 03/15/17  Yes  Donita Brooks, MD    Physical Exam: Patient Vitals for the past 24 hrs:  BP Temp Temp src Pulse Resp SpO2 Height Weight  05/02/17 0000 114/73 - - 63 10 98 % - -  05/01/17 2330 96/71 - - 64 (!) 9 98 % - -  05/01/17 2315 112/81 - - 66 10 98 % - -  05/01/17 2245 121/86 - - 70 (!) 8 98 % - -  05/01/17 2118 (!) 146/90 98.4 F (36.9 C) Oral 82 18 98 % - -  05/01/17 2003 119/75 98.3 F (36.8 C) Oral 67 16 95 % - -  05/01/17 1648 - - - - - - 5\' 11"  (1.803 m) 108.9 kg (240 lb)  05/01/17 1645 (!) 120/106 98.4 F (36.9 C) Oral 90 18 97 % - -    1. General:  in No Acute distress  well  -appearing 2. Psychological: Alert and  Oriented 3. Head/ENT:     Dry Mucous Membranes                          Head Non traumatic, neck supple                          Normal   Dentition 4. SKIN: normal  Skin turgor,  Skin clean Dry and intact no rash, multiple tattoos 5. Heart: Regular rate and rhythm no Murmur, no Rub or gallop 6. Lungs:  Clear to auscultation bilaterally, no wheezes or crackles   7. Abdomen: Soft,  non-tender, Non distended   bowel sounds present 8. Lower extremities: no clubbing, cyanosis, or edema 9. Neurologically   strength 5 out of 5 in all 4 extremities cranial nerves II through XII intact 10. MSK: Normal range of motion   body mass index is 33.47 kg/m.  Labs on Admission:   Labs on Admission: I have personally reviewed following labs and imaging studies  CBC:  Recent Labs Lab 05/01/17 1712  WBC 7.5  HGB 15.0  HCT 44.4  MCV 93.1  PLT 256   Basic Metabolic Panel:  Recent Labs Lab 05/01/17 1712  NA 138  K 3.4*  CL 106  CO2 22  GLUCOSE 239*  BUN 9  CREATININE 1.01  CALCIUM 9.3   GFR: Estimated Creatinine Clearance: 120.8 mL/min (by C-G formula based on SCr of 1.01 mg/dL). Liver Function Tests: No results for input(s): AST, ALT, ALKPHOS, BILITOT, PROT, ALBUMIN in the last 168 hours. No results for input(s): LIPASE, AMYLASE in the last 168 hours. No  results for input(s): AMMONIA in the last 168 hours. Coagulation Profile:  Recent Labs Lab 05/01/17 1712  INR 0.98   Cardiac Enzymes: No results for input(s): CKTOTAL, CKMB, CKMBINDEX, TROPONINI in the last 168 hours. BNP (last 3 results) No results for input(s): PROBNP in the last 8760 hours. HbA1C: No results for input(s): HGBA1C in the last 72 hours. CBG: No results for input(s): GLUCAP in the last 168 hours. Lipid Profile: No results for input(s): CHOL, HDL, LDLCALC, TRIG, CHOLHDL, LDLDIRECT in the last 72 hours. Thyroid Function Tests: No results for input(s): TSH, T4TOTAL, FREET4, T3FREE, THYROIDAB in the last 72 hours. Anemia Panel: No results for input(s): VITAMINB12, FOLATE, FERRITIN, TIBC, IRON, RETICCTPCT in the last 72 hours. Urine analysis: Sepsis Labs: (procalcitonin:4,lacticidven:4) )No results found for this or any previous visit (from the past 240 hour(s)).     UA not ordered  Lab Results  Component Value Date   HGBA1C 5.6 05/08/2015    Estimated Creatinine Clearance: 120.8 mL/min (by C-G formula based on SCr of 1.01 mg/dL).  BNP (last 3 results) No results for input(s): PROBNP in the last 8760 hours.   ECG REPORT  Independently reviewed Rate:100  Rhythm: NSR ST&T Change: No acute ischemic changes   QTC 466  Filed Weights   05/01/17 1648  Weight: 108.9 kg (240 lb)     Cultures:    Component Value Date/Time   SDES URINE, RANDOM 02/19/2015 1942   SPECREQUEST NONE 02/19/2015 1942   CULT  02/19/2015 1942    MULTIPLE SPECIES PRESENT, SUGGEST RECOLLECTION Performed at [facility]    REPTSTATUS 02/21/2015 FINAL 02/19/2015 1942     Radiological Exams on Admission: Dg Chest 2 View  Result Date: 05/01/2017 CLINICAL DATA:  41 y/o  M; chest pain. EXAM: CHEST  2 VIEW COMPARISON:  11/15/2015 chest radiograph FINDINGS: Stable cardiac silhouette. Status post sternotomy and CABG. Clear lungs. No acute osseous abnormality is  evident. IMPRESSION: No acute pulmonary process identified. Electronically Signed   By: Mitzi Hansen M.D.   On: 05/01/2017 17:31    Chart has been reviewed    Assessment/Plan   41 y.o. male with medical history significant of CAD s/p CABG, HTN, HLD and tobacco abuse  Admitted for chest pain  Present on Admission: . Chest pain  Atypical but given risk factors and hx of CAD will admit to tele cycle  CE obtaine echo. Cardiology aware of the patient . CAD - CABG '09, OM3 stent March 2010, last cath 4/13- medical Rx - Continue home medications appreciate cardiology input . Chest pain with moderate risk of acute coronary syndrome . HTN (hypertension) - stable continue home medicatins . Hyperlipidemia, mixed - continue statin check lipid panel in AM . Hypokalemia - will replace . TOBACCO USE -  - Spoke about importance of quitting,   - order nicotine patch   - nursing tobacco cessation protocol   . Hyperglycemia - Given BG >200 suspect new diagnosis of DM2 -  order Hg A1C, diabetes coordinator consult, SSI   Other plan as per orders.  DVT prophylaxis:    Lovenox     Code Status:  DNR/DNI   patient    Family Communication:   Family not at  Bedside    Disposition Plan:    To home once workup is complete and patient is stable                      Diabetes coordinator                            Consults called: Cardiology     Admission status:   obs   Level of care    tele      I have spent a total of 56 min on this admission    Othella Slappey 05/02/2017, 6:50 AM    [facility]  Pager [phone]   after 2 AM please page floor coverage PA If 7AM-7PM, please contact the day team taking care of the patient  [email or web address]  Password [email or web address]

## 2017-05-03 ENCOUNTER — Other Ambulatory Visit: Payer: Self-pay | Admitting: Family Medicine

## 2017-05-04 NOTE — Telephone Encounter (Signed)
Medication refilled per protocol. 

## 2017-05-18 ENCOUNTER — Other Ambulatory Visit: Payer: Self-pay | Admitting: Family Medicine

## 2017-06-01 ENCOUNTER — Other Ambulatory Visit: Payer: Self-pay | Admitting: Family Medicine

## 2017-06-07 ENCOUNTER — Ambulatory Visit (INDEPENDENT_AMBULATORY_CARE_PROVIDER_SITE_OTHER): Payer: Medicare Other | Admitting: Family Medicine

## 2017-06-07 ENCOUNTER — Encounter: Payer: Self-pay | Admitting: Family Medicine

## 2017-06-07 ENCOUNTER — Other Ambulatory Visit: Payer: Self-pay

## 2017-06-07 VITALS — BP 112/70 | HR 72 | Temp 97.4°F | Resp 18 | Wt 265.0 lb

## 2017-06-07 DIAGNOSIS — R7303 Prediabetes: Secondary | ICD-10-CM

## 2017-06-07 DIAGNOSIS — I2510 Atherosclerotic heart disease of native coronary artery without angina pectoris: Secondary | ICD-10-CM

## 2017-06-07 NOTE — Progress Notes (Signed)
Subjective:    Patient ID: [name], male    DOB: 03/28/1976, 41 y.o.   MRN: [id number]  HPI  Patient was recently admitted to the hospital with atypical chest pain.  Troponins were negative.  Cardiology felt that the chest pain was most likely musculoskeletal.  The patient attributes it to anxiety and stress over separation with his current wife.  He has been pain-free for over a month since discharge from the hospital.  He denies any shortness of breath or dyspnea on exertion or angina.  He does have a significant past medical history of CABG in 2009.  He also continues to smoke.  He has a history of hyperlipidemia.  On admission to the hospital, his random blood sugar was greater than 230.  Hemoglobin A1c was 6.0 and indicates prediabetes.  We spent more than 20 minutes today discussing dietary changes to address hyperglycemia and prediabetes. Past Medical History:  Diagnosis Date  . Anxiety   . Arthritis   . CHF (congestive heart failure) (HCC)   . Complication of anesthesia    difficulty awakening following appendectomy   . Dizziness    with humidity   . Hx of CABG   . Hyperlipidemia   . Hypertension   . Kidney stones   . MYOCARDIAL INFARCTION 10/13/2008   Qualifier: Diagnosis of  By: Juanda ChanceBrodie, MD, Johny ChessFACC, Bruce Rogers   . Numbness in right leg    pt relates to sciatica  . Tobacco user    Past Surgical History:  Procedure Laterality Date  . APPENDECTOMY    . APPENDECTOMY LAPAROSCOPIC N/A 09/17/2013   Performed by Abigail MiyamotoBlackman, Douglas, [facility]  . CARDIAC CATHETERIZATION    . CORONARY ARTERY BYPASS GRAFT     status post diaphragmatic wall infarction Rx BMS RCA 03-11-08   . CORONARY STENT PLACEMENT    . CYSTOSCOPY WITH BILATERAL RETROGRADE PYELOGRAM, RIGHT URETEROSCOPY, STONE BASKETRY WITH LASER LITHOTRIPSY ON THE RIGHT,  AND BILATERAL STENT PLACEMENT Bilateral 03/16/2015   Performed by Malen GauzeMcKenzie, Patrick L, [facility]  . CYSTOSCOPY WITH RETROGRADE PYELOGRAM, URETEROSCOPY  REMOVAL BILATERAL STENTS Left 04/02/2015   Performed by Malen GauzeMcKenzie, Patrick L[facility]  . HOLMIUM LASER APPLICATION Right 03/16/2015   Performed [facility] Malen [facility]auzeMcKenzie, Patrick L[facility]  . LEFT HEART CATHETERIZATION WITH CORONARY/GRAFT ANGIOGRAM N/A 10/19/2011   Performed by Herby AbrahamStuckey, Thomas D, MD [facility]  . ORTHOPEDIC SURGERY    . TONSILLECTOMY     Current Outpatient Medications on File Prior to Visit  Medication Sig Dispense Refill  . atorvastatin (LIPITOR) 80 MG tablet TAKE 1 TABLET BY MOUTH ONCE DAILY AT  6  PM 90 tablet 0  . baclofen (LIORESAL) 10 MG tablet TAKE 1 TABLET BY MOUTH THREE TIMES DAILY 90 tablet 3  . clonazePAM (KLONOPIN) 1 MG tablet TAKE 1 TABLET BY MOUTH TWICE DAILY AS NEEDED FOR ANXIETY 60 tablet 1  . clopidogrel (PLAVIX) 75 MG tablet Take 1 tablet (75 mg total) by mouth every morning. 90 tablet 3  . escitalopram (LEXAPRO) 10 MG tablet TAKE 1 TABLET BY MOUTH ONCE DAILY 30 tablet 3  . fenofibrate (TRICOR) 48 MG tablet Take 1 tablet (48 mg total) by mouth daily. 90 tablet 0  . gabapentin (NEURONTIN) 300 MG capsule TAKE 1 CAPSULE BY MOUTH THREE TIMES DAILY 90 capsule 5  . HYDROcodone-acetaminophen (NORCO) 7.5-325 MG tablet Take 1 tablet by mouth every 6 (six) hours as needed for moderate pain.  30 tablet 0  . isosorbide mononitrate (IMDUR) 60 MG 24 hr tablet TAKE 1 TABLET BY MOUTH ONCE DAILY 90 tablet 0  . losartan (COZAAR) 100 MG tablet TAKE ONE TABLET BY MOUTH ONCE DAILY 90 tablet 1  . metoprolol tartrate (LOPRESSOR) 25 MG tablet TAKE 1 TABLET BY MOUTH TWICE DAILY 180 tablet 1  . Multiple Vitamin (MULTI-VITAMIN DAILY PO) Take 1 Dose by mouth daily. [employer] vitamin pack takes 1 packet by mouth daily    . nitroGLYCERIN (NITROSTAT) 0.4 MG SL tablet Place 1 tablet (0.4 mg total) under the tongue every 5 (five) minutes as needed for chest pain. 25 tablet 2  . omega-3 acid ethyl esters (LOVAZA) 1 g capsule Take 1 capsule (1 g total) by mouth 2 (two) times daily. 60 capsule 0  .  RANEXA 500 MG 12 hr tablet TAKE 1 TABLET BY MOUTH TWICE DAILY 180 tablet 0   No current facility-administered medications on file prior to visit.    Allergies  Allergen Reactions  . Celexa [Citalopram] Itching and Rash  . Codeine Itching   Social History   Socioeconomic History  . Marital status: Divorced    Spouse name: Not on file  . Number of children: 4  . Years of education: Not on file  . Highest education level: Not on file  Social Needs  . Financial resource strain: Not on file  . Food insecurity - worry: Not on file  . Food insecurity - inability: Not on file  . Transportation needs - medical: Not on file  . Transportation needs - non-medical: Not on file  Occupational History  . Not on file  Tobacco Use  . Smoking status: Current Every Day Smoker    Packs/day: 0.50    Years: 30.00    Pack years: 15.00    Types: Cigarettes  . Smokeless tobacco: Former Neurosurgeon    Types: Snuff    Quit date: 07/20/1994  Substance and Sexual Activity  . Alcohol use: Yes    Alcohol/week: 0.0 oz    Comment: may have a drink every few months  . Drug use: No    Comment: former   . Sexual activity: Not Currently  Other Topics Concern  . Not on file  Social History Narrative   Lives with girlfriend and two children.  He has four children.        Review of Systems  All other systems reviewed and are negative.      Objective:   Physical Exam  Constitutional: He appears well-developed and well-nourished. No distress.  HENT:  Nose: Nose normal.  Mouth/Throat: Oropharynx is clear and moist. No oropharyngeal exudate.  Eyes: Conjunctivae are normal.  Neck: Neck supple.  Cardiovascular: Normal rate, regular rhythm and normal heart sounds.  Pulmonary/Chest: Effort normal and breath sounds normal. No respiratory distress. He has no wheezes. He has no rales.  Abdominal: Soft. Bowel sounds are normal. He exhibits no distension. There is no tenderness. There is no rebound and no guarding.   Lymphadenopathy:    He has no cervical adenopathy.  Skin: He is not diaphoretic.  Vitals reviewed.         Assessment & Plan:  Coronary artery disease involving native coronary artery of native heart without angina pectoris - Plan: COMPLETE METABOLIC PANEL WITH GFR, Lipid panel  Prediabetes  Patient is a prediabetic/borderline diabetic.  I recommended a low carbohydrate diet, less than 45 g of carbs per meal along with a low saturated fat diet and regular  aerobic exercise.  I would like to recheck a fasting lipid panel as no one has checked his cholesterol in a year.  His blood pressure today is excellent at 112/70.  His goal LDL cholesterol is less than 70.  I would recheck a hemoglobin A1c every 3-6 months and monitor his sugar closely.  Hopefully we can affect some change with therapeutic lifestyle changes.  He refuses a flu shot.  Continue to encourage smoking cessation

## 2017-06-08 ENCOUNTER — Other Ambulatory Visit: Payer: Medicare Other

## 2017-06-09 ENCOUNTER — Telehealth: Payer: Self-pay | Admitting: Family Medicine

## 2017-06-09 MED ORDER — HYDROCODONE-ACETAMINOPHEN 7.5-325 MG PO TABS: 1.0000 | ORAL_TABLET | Freq: Four times a day (QID) | ORAL | 0 refills | Status: DC | PRN

## 2017-06-09 NOTE — Telephone Encounter (Signed)
ok 

## 2017-06-09 NOTE — Telephone Encounter (Signed)
RX printed, left up front and patient aware to pick up via vm 

## 2017-06-09 NOTE — Telephone Encounter (Signed)
Patient is requesting a refill on Hydrocodone - Ok to refill??        

## 2017-06-17 ENCOUNTER — Ambulatory Visit: Payer: Medicare Other | Admitting: Family Medicine

## 2017-06-19 ENCOUNTER — Other Ambulatory Visit: Payer: Self-pay | Admitting: Family Medicine

## 2017-06-21 NOTE — Telephone Encounter (Signed)
ok 

## 2017-06-21 NOTE — Telephone Encounter (Signed)
Ok to refill??      Klonopin 

## 2017-06-29 ENCOUNTER — Other Ambulatory Visit: Payer: Self-pay | Admitting: Family Medicine

## 2017-07-19 ENCOUNTER — Other Ambulatory Visit: Payer: Self-pay | Admitting: Family Medicine

## 2017-08-18 ENCOUNTER — Other Ambulatory Visit: Payer: Self-pay | Admitting: Family Medicine

## 2017-08-18 NOTE — Telephone Encounter (Signed)
Ok to refill??  Last office visit 06/07/2017.  Last refill 06/21/2017, #1 refill.

## 2017-08-19 ENCOUNTER — Other Ambulatory Visit: Payer: Self-pay | Admitting: Family Medicine

## 2017-08-19 MED ORDER — CLONAZEPAM 1 MG PO TABS: 1.0000 mg | ORAL_TABLET | Freq: Two times a day (BID) | ORAL | 1 refills | Status: DC | PRN

## 2017-09-03 ENCOUNTER — Other Ambulatory Visit: Payer: Self-pay | Admitting: Family Medicine

## 2017-09-03 NOTE — Telephone Encounter (Signed)
Pt called [name] requesting a refill on his pain medication - called pt back [facility] to call back with the pharm he would like it sent to.

## 2017-09-06 MED ORDER — HYDROCODONE-ACETAMINOPHEN 7.5-325 MG PO TABS: 1.0000 | ORAL_TABLET | Freq: Four times a day (QID) | ORAL | 0 refills | Status: DC | PRN

## 2017-09-06 NOTE — Telephone Encounter (Signed)
Patient is requesting a refill on Hydrocodone   LOV: 06/07/17  LRF:   06/09/17

## 2017-09-11 ENCOUNTER — Other Ambulatory Visit: Payer: Self-pay | Admitting: Family Medicine

## 2017-09-13 NOTE — Telephone Encounter (Signed)
Ok to refill 

## 2017-09-18 ENCOUNTER — Other Ambulatory Visit: Payer: Self-pay | Admitting: Family Medicine

## 2017-10-13 ENCOUNTER — Other Ambulatory Visit: Payer: Self-pay | Admitting: Family Medicine

## 2017-10-14 NOTE — Telephone Encounter (Signed)
Requesting refill    Klonopin  LOV: 06/07/17  LRF:  08/19/2017

## 2017-10-22 ENCOUNTER — Other Ambulatory Visit: Payer: Self-pay | Admitting: Family Medicine

## 2017-10-22 MED ORDER — HYDROCODONE-ACETAMINOPHEN 7.5-325 MG PO TABS: 1.0000 | ORAL_TABLET | Freq: Four times a day (QID) | ORAL | 0 refills | Status: DC | PRN

## 2017-10-22 NOTE — Telephone Encounter (Signed)
Patient is requesting a refill on Hydrocodone   LOV: 06/07/17  LRF:   09/06/17

## 2017-11-07 ENCOUNTER — Other Ambulatory Visit: Payer: Self-pay | Admitting: Family Medicine

## 2017-12-09 ENCOUNTER — Other Ambulatory Visit: Payer: Self-pay | Admitting: Family Medicine

## 2017-12-09 NOTE — Telephone Encounter (Signed)
Ok to refill??  Last office visit 06/07/2017.  Last refill 10/14/2017, #1 refill.

## 2017-12-22 ENCOUNTER — Other Ambulatory Visit: Payer: Self-pay | Admitting: Family Medicine

## 2017-12-22 NOTE — Telephone Encounter (Signed)
Patient is requesting a refill on Hydrocodone   LOV: 06/07/17  LRF:   10/22/17

## 2017-12-23 MED ORDER — HYDROCODONE-ACETAMINOPHEN 7.5-325 MG PO TABS: 1.0000 | ORAL_TABLET | Freq: Four times a day (QID) | ORAL | 0 refills | Status: DC | PRN

## 2018-02-08 ENCOUNTER — Other Ambulatory Visit: Payer: Self-pay | Admitting: Family Medicine

## 2018-02-08 NOTE — Telephone Encounter (Signed)
Ok to refill Klonopin??  Last office visit 06/07/2017.  Last refill 12/09/2017, #1 refill.

## 2018-02-17 NOTE — Progress Notes (Signed)
Chief Complaint  Patient presents with  . Follow-up    CAD   History of Present Illness: 42 year old male with history of CAD s/p CABG, HTN, HLD and tobacco abuse who is here today for follow up. His coronary history began in 2009 with an inferior MI requiring PCI of the RCA and subsequent 4V CABG. He then presented in 2010 with a NSTEMI requiring PCI of the Circumflex. Cardiac cath on 10/19/11 with stable CAD, bypasses. He was discharged home on Imdur and Ranexa.      He is here today for follow up. The patient denies any chest pain, dyspnea, palpitations, lower extremity edema, orthopnea, PND, dizziness, near syncope or syncope.   Primary Care Physician: Susy Frizzle, MD  Past Medical History:  Diagnosis Date  . Anxiety   . Arthritis   . CHF (congestive heart failure) ([facility])   . Complication of anesthesia    difficulty awakening following appendectomy   . Dizziness    with humidity   . Hx of CABG   . Hyperlipidemia   . Hypertension   . Kidney stones   . MYOCARDIAL INFARCTION 10/13/2008   Qualifier: Diagnosis of  By: Olevia Perches, MD, Glenetta Hew   . Numbness in right leg    pt relates to sciatica  . Tobacco user     Past Surgical History:  Procedure Laterality Date  . APPENDECTOMY    . CARDIAC CATHETERIZATION    . CORONARY ARTERY BYPASS GRAFT     status post diaphragmatic wall infarction Rx BMS RCA 03-11-08   . CORONARY STENT PLACEMENT    . CYSTOSCOPY WITH RETROGRADE PYELOGRAM, URETEROSCOPY AND STENT PLACEMENT Bilateral 03/16/2015   Procedure: CYSTOSCOPY WITH BILATERAL RETROGRADE PYELOGRAM, RIGHT URETEROSCOPY, STONE BASKETRY WITH LASER LITHOTRIPSY ON THE RIGHT,  AND BILATERAL STENT PLACEMENT;  Surgeon: Cleon Gustin, MD;  Location: [facility];  Service: Urology;  Laterality: Bilateral;  . CYSTOSCOPY WITH RETROGRADE PYELOGRAM, URETEROSCOPY AND STENT PLACEMENT Left 04/02/2015   Procedure: CYSTOSCOPY WITH RETROGRADE PYELOGRAM, URETEROSCOPY REMOVAL BILATERAL STENTS;   Surgeon: Cleon Gustin, MD;  Location: [facility];  Service: Urology;  Laterality: Left;  . HOLMIUM LASER APPLICATION Right 9/48/0165   Procedure: HOLMIUM LASER APPLICATION;  Surgeon: Cleon Gustin, MD;  Location: [facility];  Service: Urology;  Laterality: Right;  . LAPAROSCOPIC APPENDECTOMY N/A 09/17/2013   Procedure: APPENDECTOMY LAPAROSCOPIC;  Surgeon: Harl Bowie, MD;  Location: [facility];  Service: General;  Laterality: N/A;  . LEFT HEART CATHETERIZATION WITH CORONARY/GRAFT ANGIOGRAM N/A 10/19/2011   Procedure: LEFT HEART CATHETERIZATION WITH Beatrix Fetters;  Surgeon: Hillary Bow, MD;  Location: [facility] CATH LAB;  Service: Cardiovascular;  Laterality: N/A;  . ORTHOPEDIC SURGERY    . TONSILLECTOMY      Current Outpatient Medications  Medication Sig Dispense Refill  . atorvastatin (LIPITOR) 80 MG tablet TAKE 1 TABLET BY MOUTH ONCE DAILY AT  6  PM 90 tablet 1  . baclofen (LIORESAL) 10 MG tablet TAKE 1 TABLET BY MOUTH THREE TIMES DAILY 90 tablet 3  . clonazePAM (KLONOPIN) 1 MG tablet TAKE 1 TABLET BY MOUTH TWICE DAILY AS NEEDED FOR ANXIETY 60 tablet 1  . clopidogrel (PLAVIX) 75 MG tablet Take 1 tablet (75 mg total) by mouth every morning. 90 tablet 3  . escitalopram (LEXAPRO) 10 MG tablet TAKE 1 TABLET BY MOUTH ONCE DAILY 30 tablet 3  . fenofibrate (TRICOR) 48 MG tablet TAKE 1 TABLET BY MOUTH ONCE DAILY 90 tablet 1  . gabapentin (  NEURONTIN) 300 MG capsule TAKE 1 CAPSULE BY MOUTH THREE TIMES DAILY 90 capsule 5  . HYDROcodone-acetaminophen (NORCO) 7.5-325 MG tablet Take 1 tablet by mouth every 6 (six) hours as needed for moderate pain. 30 tablet 0  . isosorbide mononitrate (IMDUR) 60 MG 24 hr tablet TAKE 1 TABLET BY MOUTH ONCE DAILY 90 tablet 3  . losartan (COZAAR) 100 MG tablet TAKE 1 TABLET BY MOUTH ONCE DAILY 90 tablet 1  . metoprolol tartrate (LOPRESSOR) 25 MG tablet TAKE 1 TABLET BY MOUTH TWICE DAILY 180 tablet 1  . Multiple Vitamin (MULTI-VITAMIN DAILY PO) Take 1 Dose by  mouth daily. [employer] vitamin pack takes 1 packet by mouth daily    . nitroGLYCERIN (NITROSTAT) 0.4 MG SL tablet Place 1 tablet (0.4 mg total) under the tongue every 5 (five) minutes as needed for chest pain. 25 tablet 2  . omega-3 acid ethyl esters (LOVAZA) 1 g capsule Take 1 capsule (1 g total) by mouth 2 (two) times daily. 60 capsule 0  . RANEXA 500 MG 12 hr tablet TAKE 1 TABLET BY MOUTH TWICE DAILY 180 tablet 2   No current facility-administered medications for this visit.     Allergies  Allergen Reactions  . Celexa [Citalopram] Itching and Rash  . Codeine Itching    Social History   Socioeconomic History  . Marital status: Divorced    Spouse name: Not on file  . Number of children: 4  . Years of education: Not on file  . Highest education level: Not on file  Occupational History  . Not on file  Social Needs  . Financial resource strain: Not on file  . Food insecurity:    Worry: Not on file    Inability: Not on file  . Transportation needs:    Medical: Not on file    Non-medical: Not on file  Tobacco Use  . Smoking status: Current Every Day Smoker    Packs/day: 0.50    Years: 30.00    Pack years: 15.00    Types: Cigarettes  . Smokeless tobacco: Former Systems developer    Types: Snuff    Quit date: 07/20/1994  Substance and Sexual Activity  . Alcohol use: Yes    Alcohol/week: 0.0 oz    Comment: may have a drink every few months  . Drug use: No    Comment: former   . Sexual activity: Not Currently  Lifestyle  . Physical activity:    Days per week: Not on file    Minutes per session: Not on file  . Stress: Not on file  Relationships  . Social connections:    Talks on phone: Not on file    Gets together: Not on file    Attends religious service: Not on file    Active member of club or organization: Not on file    Attends meetings of clubs or organizations: Not on file    Relationship status: Not on file  . Intimate partner violence:    Fear of current or ex partner: Not on  file    Emotionally abused: Not on file    Physically abused: Not on file    Forced sexual activity: Not on file  Other Topics Concern  . Not on file  Social History Narrative   Lives with girlfriend and two children.  He has four children.      Family History  Adopted: Yes  Problem Relation Age of Onset  . Cancer Mother   . Diabetes Mother   .  Hyperlipidemia Mother   . Hypertension Mother   . Heart failure Mother        Died age 5  . Cancer Sister   . Diabetes Sister   . Hyperlipidemia Sister   . Hypertension Sister     Review of Systems:  As stated in the HPI and otherwise negative.   BP 122/86   Pulse 89   Ht 5' 10"  (1.778 m)   Wt 277 lb 3.2 oz (125.7 kg)   SpO2 99%   BMI 39.77 kg/m   Physical Examination:  General: Well developed, well nourished, NAD  HEENT: OP clear, mucus membranes moist  SKIN: warm, dry. No rashes. Neuro: No focal deficits  Musculoskeletal: Muscle strength 5/5 all ext  Psychiatric: Mood and affect normal  Neck: No JVD, no carotid bruits, no thyromegaly, no lymphadenopathy.  Lungs:Clear bilaterally, no wheezes, rhonci, crackles Cardiovascular: Regular rate and rhythm. No murmurs, gallops or rubs. Abdomen:Soft. Bowel sounds present. Non-tender.  Extremities: No lower extremity edema. Pulses are 2 + in the bilateral DP/PT.  EKG:  EKG is ordered today. The ekg ordered today demonstrates NSR, rate 89 bpm. Old inferior Q waves  Recent Labs: 05/01/2017: BUN 9; Creatinine, Ser 1.01; Hemoglobin 15.0; Platelets 256; Potassium 3.4; Sodium 138 05/02/2017: Magnesium 1.7   Lipid Panel    Component Value Date/Time   CHOL 188 05/12/2016 1133   TRIG 334 (H) 05/12/2016 1133   HDL 42 05/12/2016 1133   CHOLHDL 4.5 05/12/2016 1133   VLDL 67 (H) 05/12/2016 1133   LDLCALC 79 05/12/2016 1133   LDLDIRECT 103.2 06/15/2012 1446     Wt Readings from Last 3 Encounters:  02/18/18 277 lb 3.2 oz (125.7 kg)  06/07/17 265 lb (120.2 kg)  05/02/17 248 lb  11.2 oz (112.8 kg)     Other studies Reviewed: Additional studies/ records that were reviewed today include: . Review of the above records demonstrates:    Assessment and Plan:   1. CAD with chronic stable angina: He has chronic stable angina. Last cath April 2013 with stable disease, bypasses. Continue Plavix, beta blocker, statin, Imdur and Ranexa. He does not tolerate ASA due to GI upset. .   2. HYPERLIPIDEMIA/Hypertriglyceridemia:. LDL near goal in 2017 but he missed f/u appt for labs. TG elevated. Will continue statin and Tricor. Check lipids and LFTs oday  3. TOBACCO USE: Smoking cessation is recommended.   4. HTN: BP is controlled. No changes. Check BMET today  Current medicines are reviewed at length with the patient today.  The patient does not have concerns regarding medicines.  The following changes have been made:  no change  Labs/ tests ordered today include:   Orders Placed This Encounter  Procedures  . Comp Met (CMET)  . Lipid Profile  . EKG 12-Lead    Disposition:   FU with me in 12  months  Signed, Lauree Chandler, MD 02/18/2018 10:37 AM    [facility] [place], [place], [place]  [address] Phone: [phone]; Fax: [phone]

## 2018-02-18 ENCOUNTER — Ambulatory Visit (INDEPENDENT_AMBULATORY_CARE_PROVIDER_SITE_OTHER): Payer: Medicare Other | Admitting: Cardiovascular Disease

## 2018-02-18 ENCOUNTER — Encounter: Payer: Self-pay | Admitting: Cardiovascular Disease

## 2018-02-18 VITALS — BP 122/86 | HR 89 | Ht 70.0 in | Wt 277.2 lb

## 2018-02-18 DIAGNOSIS — Z720 Tobacco use: Secondary | ICD-10-CM | POA: Diagnosis not present

## 2018-02-18 DIAGNOSIS — I10 Essential (primary) hypertension: Secondary | ICD-10-CM | POA: Diagnosis not present

## 2018-02-18 DIAGNOSIS — E7800 Pure hypercholesterolemia, unspecified: Secondary | ICD-10-CM | POA: Diagnosis not present

## 2018-02-18 DIAGNOSIS — I25118 Atherosclerotic heart disease of native coronary artery with other forms of angina pectoris: Secondary | ICD-10-CM | POA: Diagnosis not present

## 2018-02-18 LAB — COMPREHENSIVE METABOLIC PANEL
ALBUMIN: 4.8 g/dL (ref 3.5–5.5)
ALK PHOS: 59 IU/L (ref 39–117)
ALT: 31 IU/L (ref 0–44)
AST: 20 IU/L (ref 0–40)
Albumin/Globulin Ratio: 1.8 (ref 1.2–2.2)
BILIRUBIN TOTAL: 0.4 mg/dL (ref 0.0–1.2)
BUN / CREAT RATIO: 11 (ref 9–20)
BUN: 11 mg/dL (ref 6–24)
CHLORIDE: 101 mmol/L (ref 96–106)
CO2: 20 mmol/L (ref 20–29)
CREATININE: 1.03 mg/dL (ref 0.76–1.27)
Calcium: 9.9 mg/dL (ref 8.7–10.2)
GFR calc Af Amer: 104 mL/min/{1.73_m2} (ref >59)
GFR calc non Af Amer: 90 mL/min/{1.73_m2} (ref >59)
GLOBULIN, TOTAL: 2.6 g/dL (ref 1.5–4.5)
Glucose: 109 mg/dL — ABNORMAL HIGH (ref 65–99)
Potassium: 4.1 mmol/L (ref 3.5–5.2)
SODIUM: 139 mmol/L (ref 134–144)
Total Protein: 7.4 g/dL (ref 6.0–8.5)

## 2018-02-18 LAB — LIPID PANEL
CHOLESTEROL TOTAL: 208 mg/dL — AB (ref 100–199)
Chol/HDL Ratio: 4.6 ratio (ref 0.0–5.0)
HDL: 45 mg/dL (ref >39)
Triglycerides: 486 mg/dL — ABNORMAL HIGH (ref 0–149)

## 2018-02-18 MED ORDER — NITROGLYCERIN 0.4 MG SL SUBL: 0.4000 mg | SUBLINGUAL_TABLET | SUBLINGUAL | 6 refills | Status: DC | PRN

## 2018-02-18 NOTE — Addendum Note (Signed)
Addended by: Dossie ArbourADELMAN, Kuulei Kleier L on: 02/18/2018 12:21 PM   Modules accepted: Orders

## 2018-02-18 NOTE — Patient Instructions (Signed)
Medication Instructions:  Your physician recommends that you continue on your current medications as directed. Please refer to the Current Medication list given to you today.   Labwork: Lab work to be done today--CMET and Lipid profile  Testing/Procedures: none  Follow-Up: Your physician recommends that you schedule a follow-up appointment in: 12 months. Please call our office in about 8 months to schedule this appointment    Any Other Special Instructions Will Be Listed Below (If Applicable).     If you need a refill on your cardiac medications before your next appointment, please call your pharmacy.   

## 2018-03-23 ENCOUNTER — Other Ambulatory Visit: Payer: Self-pay | Admitting: Family Medicine

## 2018-03-23 NOTE — Telephone Encounter (Signed)
Patient is requesting a refill on Hydrocodone   LOV: 06/07/17  LRF:    12/23/17

## 2018-03-24 ENCOUNTER — Encounter: Payer: Self-pay | Admitting: Family Medicine

## 2018-03-24 MED ORDER — HYDROCODONE-ACETAMINOPHEN 7.5-325 MG PO TABS: 1.0000 | ORAL_TABLET | Freq: Four times a day (QID) | ORAL | 0 refills | Status: DC | PRN

## 2018-03-24 NOTE — Telephone Encounter (Signed)
Letter sent to pt to schedule an appt.

## 2018-03-24 NOTE — Telephone Encounter (Signed)
Needs ov

## 2018-03-25 ENCOUNTER — Encounter: Payer: Self-pay | Admitting: *Deleted

## 2018-04-11 ENCOUNTER — Other Ambulatory Visit: Payer: Self-pay | Admitting: Family Medicine

## 2018-04-11 NOTE — Telephone Encounter (Signed)
Requesting refill    Klonopin   LOV: 06/07/17  LRF:  02/08/18

## 2018-04-12 ENCOUNTER — Telehealth: Payer: Self-pay | Admitting: Family Medicine

## 2018-04-12 MED ORDER — RANOLAZINE ER 500 MG PO TB12: 500.0000 mg | ORAL_TABLET | Freq: Two times a day (BID) | ORAL | 0 refills | Status: DC

## 2018-04-12 NOTE — Telephone Encounter (Signed)
Pt calling to get a refill on Ranexa. Called pt and will refill it but he needs an ov per Dr. Tanya NonesPickard. He states that he will have to do that next month and will call back to schedule. Med sent to pharm

## 2018-05-10 ENCOUNTER — Other Ambulatory Visit: Payer: Self-pay | Admitting: Family Medicine

## 2018-06-10 ENCOUNTER — Other Ambulatory Visit: Payer: Self-pay | Admitting: Family Medicine

## 2018-06-10 NOTE — Telephone Encounter (Signed)
Ok to refill Clonazepam??   Last office visit 06/07/2017.  Last refill 04/11/2018.

## 2018-07-10 ENCOUNTER — Other Ambulatory Visit: Payer: Self-pay | Admitting: Family Medicine

## 2018-07-15 ENCOUNTER — Ambulatory Visit: Payer: Medicare Other | Admitting: Family Medicine

## 2018-07-25 ENCOUNTER — Ambulatory Visit (INDEPENDENT_AMBULATORY_CARE_PROVIDER_SITE_OTHER): Payer: Medicare Other | Admitting: Family Medicine

## 2018-07-25 ENCOUNTER — Encounter: Payer: Self-pay | Admitting: Family Medicine

## 2018-07-25 VITALS — BP 126/78 | HR 76 | Temp 97.5°F | Resp 18 | Ht 72.0 in | Wt 273.0 lb

## 2018-07-25 DIAGNOSIS — I2510 Atherosclerotic heart disease of native coronary artery without angina pectoris: Secondary | ICD-10-CM | POA: Diagnosis not present

## 2018-07-25 DIAGNOSIS — G8929 Other chronic pain: Secondary | ICD-10-CM

## 2018-07-25 DIAGNOSIS — R7303 Prediabetes: Secondary | ICD-10-CM | POA: Diagnosis not present

## 2018-07-25 DIAGNOSIS — M5440 Lumbago with sciatica, unspecified side: Secondary | ICD-10-CM | POA: Diagnosis not present

## 2018-07-25 DIAGNOSIS — E7800 Pure hypercholesterolemia, unspecified: Secondary | ICD-10-CM

## 2018-07-25 MED ORDER — HYDROCODONE-ACETAMINOPHEN 7.5-325 MG PO TABS: 1.0000 | ORAL_TABLET | Freq: Four times a day (QID) | ORAL | 0 refills | Status: DC | PRN

## 2018-07-25 NOTE — Progress Notes (Signed)
Subjective:    Patient ID: [name], male    DOB: 08/18/1975, 43 y.o.   MRN: [id number]  HPI  05/2017 Patient was recently admitted to the hospital with atypical chest pain.  Troponins were negative.  Cardiology felt that the chest pain was most likely musculoskeletal.  The patient attributes it to anxiety and stress over separation with his current wife.  He has been pain-free for over a month since discharge from the hospital.  He denies any shortness of breath or dyspnea on exertion or angina.  He does have a significant past medical history of CABG in 2009.  He also continues to smoke.  He has a history of hyperlipidemia.  On admission to the hospital, his random blood sugar was greater than 230.  Hemoglobin A1c was 6.0 and indicates prediabetes.  We spent more than 20 minutes today discussing dietary changes to address hyperglycemia and prediabetes.  At that time, my plan was: Patient is a prediabetic/borderline diabetic.  I recommended a low carbohydrate diet, less than 45 g of carbs per meal along with a low saturated fat diet and regular aerobic exercise.  I would like to recheck a fasting lipid panel as no one has checked his cholesterol in a year.  His blood pressure today is excellent at 112/70.  His goal LDL cholesterol is less than 70.  I would recheck a hemoglobin A1c every 3-6 months and monitor his sugar closely.  Hopefully we can affect some change with therapeutic lifestyle changes.  He refuses a flu shot.  Continue to encourage smoking cessation  07/25/2018  Patient is here today for follow-up.  He is still smoking approximately 1/2 pack of cigarettes per day.  He continues to report severe low back pain and neuropathic pain in his legs as well as in his arms.  The pain in his arm sounds more akin to ulnar neuropathy as he reports numbness and tingling along the medial aspect of his elbow down the medial aspect of the forearm into the fourth and fifth digits bilaterally.  We  discussed seeing a hand specialist for this but he declines this at the present time.  However he would be interested in increasing the dose of the gabapentin in an effort to better try to manage his neuropathic pain particularly at night.  Currently he is taking 300 mg 3 times a day.  He did see some benefit from this.  He is also on a combination of TriCor as well as high-dose Lipitor given his history of premature cardiovascular disease.  He denies any myalgias or right upper quadrant pain but he is overdue for fasting lab work and liver function test.  His blood pressure today is adequately controlled.  He denies any chest pain shortness of breath or dyspnea on exertion.  He is still on dual antianginal medication including Ranexa and isosorbide.  He is still taking his Plavix.  He is also compliant with his metoprolol and Cozaar. Past Medical History:  Diagnosis Date  . Anxiety   . Arthritis   . CHF (congestive heart failure) ([facility])   . Complication of anesthesia    difficulty awakening following appendectomy   . Dizziness    with humidity   . Hx of CABG   . Hyperlipidemia   . Hypertension   . Kidney stones   . MYOCARDIAL INFARCTION 10/13/2008   Qualifier: Diagnosis of  By: Juanda Chance, MD, Johny Chess   . Numbness in right leg  pt relates to sciatica  . Tobacco user    Past Surgical History:  Procedure Laterality Date  . APPENDECTOMY    . CARDIAC CATHETERIZATION    . CORONARY ARTERY BYPASS GRAFT     status post diaphragmatic wall infarction Rx BMS RCA 03-11-08   . CORONARY STENT PLACEMENT    . CYSTOSCOPY WITH RETROGRADE PYELOGRAM, URETEROSCOPY AND STENT PLACEMENT Bilateral 03/16/2015   Procedure: CYSTOSCOPY WITH BILATERAL RETROGRADE PYELOGRAM, RIGHT URETEROSCOPY, STONE BASKETRY WITH LASER LITHOTRIPSY ON THE RIGHT,  AND BILATERAL STENT PLACEMENT;  Surgeon: Malen Gauze, MD;  Location: [facility];  Service: Urology;  Laterality: Bilateral;  . CYSTOSCOPY WITH RETROGRADE PYELOGRAM,  URETEROSCOPY AND STENT PLACEMENT Left 04/02/2015   Procedure: CYSTOSCOPY WITH RETROGRADE PYELOGRAM, URETEROSCOPY REMOVAL BILATERAL STENTS;  Surgeon: Malen Gauze, MD;  Location: [facility];  Service: Urology;  Laterality: Left;  . HOLMIUM LASER APPLICATION Right 03/16/2015   Procedure: HOLMIUM LASER APPLICATION;  Surgeon: Malen Gauze, MD;  Location: [facility];  Service: Urology;  Laterality: Right;  . LAPAROSCOPIC APPENDECTOMY N/A 09/17/2013   Procedure: APPENDECTOMY LAPAROSCOPIC;  Surgeon: Shelly Rubenstein, MD;  Location: [facility];  Service: General;  Laterality: N/A;  . LEFT HEART CATHETERIZATION WITH CORONARY/GRAFT ANGIOGRAM N/A 10/19/2011   Procedure: LEFT HEART CATHETERIZATION WITH Isabel Caprice;  Surgeon: Herby Abraham, MD;  Location: [facility];  Service: Cardiovascular;  Laterality: N/A;  . ORTHOPEDIC SURGERY    . TONSILLECTOMY     Current Outpatient Medications on File Prior to Visit  Medication Sig Dispense Refill  . atorvastatin (LIPITOR) 80 MG tablet TAKE 1 TABLET BY MOUTH ONCE DAILY AT  6  PM 90 tablet 1  . baclofen (LIORESAL) 10 MG tablet TAKE 1 TABLET BY MOUTH THREE TIMES DAILY 90 tablet 3  . clonazePAM (KLONOPIN) 1 MG tablet TAKE 1 TABLET BY MOUTH TWICE DAILY AS NEEDED FOR ANXIETY 60 tablet 1  . clopidogrel (PLAVIX) 75 MG tablet TAKE 1 TABLET BY MOUTH ONCE DAILY IN THE MORNING 90 tablet 3  . escitalopram (LEXAPRO) 10 MG tablet TAKE 1 TABLET BY MOUTH ONCE DAILY 30 tablet 3  . fenofibrate (TRICOR) 48 MG tablet TAKE 1 TABLET BY MOUTH ONCE DAILY 90 tablet 1  . gabapentin (NEURONTIN) 300 MG capsule TAKE 1 CAPSULE BY MOUTH THREE TIMES DAILY 90 capsule 5  . HYDROcodone-acetaminophen (NORCO) 7.5-325 MG tablet Take 1 tablet by mouth every 6 (six) hours as needed for moderate pain. 30 tablet 0  . isosorbide mononitrate (IMDUR) 60 MG 24 hr tablet TAKE 1 TABLET BY MOUTH ONCE DAILY 90 tablet 0  . losartan (COZAAR) 100 MG tablet TAKE 1 TABLET BY MOUTH ONCE DAILY 90 tablet 1  .  metoprolol tartrate (LOPRESSOR) 25 MG tablet TAKE 1 TABLET BY MOUTH TWICE DAILY 180 tablet 1  . Multiple Vitamin (MULTI-VITAMIN DAILY PO) Take 1 Dose by mouth daily. [employer] vitamin pack takes 1 packet by mouth daily    . nitroGLYCERIN (NITROSTAT) 0.4 MG SL tablet Place 1 tablet (0.4 mg total) under the tongue every 5 (five) minutes as needed for chest pain. 25 tablet 6  . omega-3 acid ethyl esters (LOVAZA) 1 g capsule Take 1 capsule (1 g total) by mouth 2 (two) times daily. 60 capsule 0  . ranolazine (RANEXA) 500 MG 12 hr tablet TAKE 1 TABLET BY MOUTH TWICE DAILY 180 tablet 0   No current facility-administered medications on file prior to visit.    Allergies  Allergen Reactions  . Celexa [Citalopram] Itching and Rash  .  Codeine Itching   Social History   Socioeconomic History  . Marital status: Divorced    Spouse name: Not on file  . Number of children: 4  . Years of education: Not on file  . Highest education level: Not on file  Occupational History  . Not on file  Social Needs  . Financial resource strain: Not on file  . Food insecurity:    Worry: Not on file    Inability: Not on file  . Transportation needs:    Medical: Not on file    Non-medical: Not on file  Tobacco Use  . Smoking status: Current Every Day Smoker    Packs/day: 0.50    Years: 30.00    Pack years: 15.00    Types: Cigarettes  . Smokeless tobacco: Former NeurosurgeonUser    Types: Snuff    Quit date: 07/20/1994  Substance and Sexual Activity  . Alcohol use: Yes    Alcohol/week: 0.0 standard drinks    Comment: may have a drink every few months  . Drug use: No    Comment: former   . Sexual activity: Not Currently  Lifestyle  . Physical activity:    Days per week: Not on file    Minutes per session: Not on file  . Stress: Not on file  Relationships  . Social connections:    Talks on phone: Not on file    Gets together: Not on file    Attends religious service: Not on file    Active member of club or  organization: Not on file    Attends meetings of clubs or organizations: Not on file    Relationship status: Not on file  . Intimate partner violence:    Fear of current or ex partner: Not on file    Emotionally abused: Not on file    Physically abused: Not on file    Forced sexual activity: Not on file  Other Topics Concern  . Not on file  Social History Narrative   Lives with girlfriend and two children.  He has four children.        Review of Systems  All other systems reviewed and are negative.      Objective:   Physical Exam  Constitutional: He appears well-developed and well-nourished. No distress.  HENT:  Nose: Nose normal.  Mouth/Throat: Oropharynx is clear and moist. No oropharyngeal exudate.  Eyes: Conjunctivae are normal.  Neck: Neck supple.  Cardiovascular: Normal rate, regular rhythm and normal heart sounds.  Pulmonary/Chest: Effort normal and breath sounds normal. No respiratory distress. He has no wheezes. He has no rales.  Abdominal: Soft. Bowel sounds are normal. He exhibits no distension. There is no abdominal tenderness. There is no rebound and no guarding.  Lymphadenopathy:    He has no cervical adenopathy.  Skin: He is not diaphoretic.  Vitals reviewed.         Assessment & Plan:  Coronary artery disease involving native coronary artery of native heart without angina pectoris - Plan: CBC with Differential/Platelet, COMPLETE METABOLIC PANEL WITH GFR, Lipid panel  Prediabetes - Plan: CBC with Differential/Platelet, COMPLETE METABOLIC PANEL WITH GFR, Lipid panel, Hemoglobin A1c  Pure hypercholesterolemia - Plan: CBC with Differential/Platelet, COMPLETE METABOLIC PANEL WITH GFR, Lipid panel  Chronic midline low back pain with sciatica, sciatica laterality unspecified   Strongly encourage smoking cessation.  I explained to the patient in no uncertain terms that this is rapidly increasing his risk for future coronary events.  Patient is  pre-contemplative and has no desire to quit at the present time.  His blood pressure today is well controlled.  I will check a fasting lipid panel.  His goal LDL cholesterol is less than 70.  Monitor liver function test to ensure no evidence of liver inflammation due to his cholesterol medication.  He denies any evidence of myopathy or myositis.  Given the increased neuropathic pain, he can increase gabapentin to 300 mg in the morning, 300 mg in the afternoon, and 600 mg in the evening.  I will refill his hydrocodone/acetaminophen 7.5/325 which he uses sparingly for low back pain.  Patient tends to have more problems with his back hurting in colder weather and we are in the middle of winter.  He is unable to take NSAIDs due to his history of cardiovascular disease.  I will monitor his prediabetes with a CMP, fasting lipid panel, and a hemoglobin A1c.

## 2018-07-26 LAB — COMPLETE METABOLIC PANEL WITH GFR
AG Ratio: 2 (calc) (ref 1.0–2.5)
ALBUMIN MSPROF: 4.3 g/dL (ref 3.6–5.1)
ALT: 21 U/L (ref 9–46)
AST: 19 U/L (ref 10–40)
Alkaline phosphatase (APISO): 51 U/L (ref 40–115)
BUN: 9 mg/dL (ref 7–25)
CALCIUM: 9.4 mg/dL (ref 8.6–10.3)
CO2: 26 mmol/L (ref 20–32)
Chloride: 106 mmol/L (ref 98–110)
Creat: 1.05 mg/dL (ref 0.60–1.35)
GFR, Est African American: 101 mL/min/{1.73_m2} (ref >=60)
GFR, Est Non African American: 87 mL/min/{1.73_m2} (ref >=60)
GLUCOSE: 93 mg/dL (ref 65–99)
Globulin: 2.2 g/dL (calc) (ref 1.9–3.7)
Potassium: 4.2 mmol/L (ref 3.5–5.3)
Sodium: 140 mmol/L (ref 135–146)
TOTAL PROTEIN: 6.5 g/dL (ref 6.1–8.1)
Total Bilirubin: 0.5 mg/dL (ref 0.2–1.2)

## 2018-07-26 LAB — CBC WITH DIFFERENTIAL/PLATELET
ABSOLUTE MONOCYTES: 525 {cells}/uL (ref 200–950)
BASOS ABS: 30 {cells}/uL (ref 0–200)
BASOS PCT: 0.4 %
EOS ABS: 244 {cells}/uL (ref 15–500)
EOS PCT: 3.3 %
HCT: 44.1 % (ref 38.5–50.0)
HEMOGLOBIN: 15.4 g/dL (ref 13.2–17.1)
Lymphs Abs: 2605 cells/uL (ref 850–3900)
MCH: 32.4 pg (ref 27.0–33.0)
MCHC: 34.9 g/dL (ref 32.0–36.0)
MCV: 92.6 fL (ref 80.0–100.0)
MONOS PCT: 7.1 %
MPV: 11.3 fL (ref 7.5–12.5)
NEUTROS ABS: 3996 {cells}/uL (ref 1500–7800)
Neutrophils Relative %: 54 %
Platelets: 295 10*3/uL (ref 140–400)
RBC: 4.76 10*6/uL (ref 4.20–5.80)
RDW: 12.7 % (ref 11.0–15.0)
Total Lymphocyte: 35.2 %
WBC: 7.4 10*3/uL (ref 3.8–10.8)

## 2018-07-26 LAB — LIPID PANEL
Cholesterol: 191 mg/dL (ref <200)
HDL: 36 mg/dL — ABNORMAL LOW (ref >40)
Non-HDL Cholesterol (Calc): 155 mg/dL (calc) — ABNORMAL HIGH (ref <130)
TRIGLYCERIDES: 502 mg/dL — AB (ref <150)
Total CHOL/HDL Ratio: 5.3 (calc) — ABNORMAL HIGH (ref <5.0)

## 2018-07-26 LAB — HEMOGLOBIN A1C
EAG (MMOL/L): 7 (calc)
Hgb A1c MFr Bld: 6 % of total Hgb — ABNORMAL HIGH (ref <5.7)
Mean Plasma Glucose: 126 (calc)

## 2018-07-28 ENCOUNTER — Other Ambulatory Visit: Payer: Self-pay | Admitting: Family Medicine

## 2018-07-28 MED ORDER — GABAPENTIN 300 MG PO CAPS: ORAL_CAPSULE | ORAL | 5 refills | Status: DC

## 2018-07-28 MED ORDER — ICOSAPENT ETHYL 1 G PO CAPS: ORAL_CAPSULE | ORAL | 5 refills | Status: DC

## 2018-08-09 ENCOUNTER — Other Ambulatory Visit: Payer: Self-pay | Admitting: Family Medicine

## 2018-08-10 NOTE — Telephone Encounter (Signed)
Requesting refill    Klonopin  LOV: 07/25/2018  LRF:  06/10/18

## 2018-08-11 MED ORDER — CLONAZEPAM 1 MG PO TABS: 1.0000 mg | ORAL_TABLET | Freq: Two times a day (BID) | ORAL | 0 refills | Status: DC | PRN

## 2018-08-18 ENCOUNTER — Telehealth: Payer: Self-pay | Admitting: Family Medicine

## 2018-08-18 NOTE — Telephone Encounter (Signed)
Pt called and states that he is having increased back pain and would like to know if there is something else he can do for the pain? Or does he need to come in for an apt?

## 2018-08-19 NOTE — Telephone Encounter (Signed)
I need to see him

## 2018-08-19 NOTE — Telephone Encounter (Signed)
Pt aware to call and schedule apt via vm

## 2018-09-09 ENCOUNTER — Other Ambulatory Visit: Payer: Self-pay | Admitting: Family Medicine

## 2018-09-12 NOTE — Telephone Encounter (Signed)
Ok to refill Clonazepam??  Last office visit 07/25/2018.  Last refill 08/11/2018.

## 2018-09-23 ENCOUNTER — Other Ambulatory Visit: Payer: Self-pay

## 2018-10-03 ENCOUNTER — Ambulatory Visit (INDEPENDENT_AMBULATORY_CARE_PROVIDER_SITE_OTHER): Payer: Medicare Other | Admitting: Family Medicine

## 2018-10-03 ENCOUNTER — Encounter: Payer: Self-pay | Admitting: Family Medicine

## 2018-10-03 ENCOUNTER — Other Ambulatory Visit: Payer: Self-pay

## 2018-10-03 VITALS — BP 120/78 | HR 86 | Temp 97.7°F | Resp 18 | Ht 72.0 in | Wt 274.0 lb

## 2018-10-03 DIAGNOSIS — M5442 Lumbago with sciatica, left side: Secondary | ICD-10-CM

## 2018-10-03 DIAGNOSIS — I2510 Atherosclerotic heart disease of native coronary artery without angina pectoris: Secondary | ICD-10-CM | POA: Diagnosis not present

## 2018-10-03 DIAGNOSIS — M5440 Lumbago with sciatica, unspecified side: Secondary | ICD-10-CM | POA: Diagnosis not present

## 2018-10-03 NOTE — Progress Notes (Signed)
Subjective:    Patient ID: [name], male    DOB: [birth year], 43 y.o.   MRN: [id number]  HPI  MRI 2016 revealed: Worsening of degenerative disc disease at L5-S1 with a shallow broad-based disc herniation more prominent in the left posterior lateral direction. Narrowing of the subarticular lateral recesses, left more than right, which could cause neural compression.  Facet degeneration at L4-5 that could be associated with low back pain. This is also worsened since 2013.  Patient has chronic longstanding low back pain.  His most recent MRI of his lumbar spine was obtained in 2016 and is dictated above.  Patient states for the last month, the pain in the center of his lower back has intensified.  On 4 separate occasions, he bent over and felt a popping sensation on his lower left side.  He also reports numbness radiating down his whole leg.  This occurs almost on a daily basis.  Is particularly worse after prolonged standing and walking.  He also occasionally has pain radiating to his anterior left hip and also into his gluteus.  He has a negative straight leg raise today.  He has muscle strength 5/5 equal and symmetric in both lower extremities.  There is no sign of muscle weakness.  Reflexes are normal at the knee as well as at the Achilles tendon.  He does have tenderness to palpation at the lumbar bar paraspinal muscles left greater than right at approximate the level of L5 Past Medical History:  Diagnosis Date  . Anxiety   . Arthritis   . CHF (congestive heart failure) (HCC)   . Complication of anesthesia    difficulty awakening following appendectomy   . Dizziness    with humidity   . Hx of CABG   . Hyperlipidemia   . Hypertension   . Kidney stones   . MYOCARDIAL INFARCTION 10/13/2008   Qualifier: Diagnosis of  By: Juanda Chance, MD, Johny Chess   . Numbness in right leg    pt relates to sciatica  . Tobacco user    Past Surgical History:  Procedure Laterality Date  .  APPENDECTOMY    . CARDIAC CATHETERIZATION    . CORONARY ARTERY BYPASS GRAFT     status post diaphragmatic wall infarction Rx BMS RCA 03-11-08   . CORONARY STENT PLACEMENT    . CYSTOSCOPY WITH RETROGRADE PYELOGRAM, URETEROSCOPY AND STENT PLACEMENT Bilateral 03/16/2015   Procedure: CYSTOSCOPY WITH BILATERAL RETROGRADE PYELOGRAM, RIGHT URETEROSCOPY, STONE BASKETRY WITH LASER LITHOTRIPSY ON THE RIGHT,  AND BILATERAL STENT PLACEMENT;  Surgeon: Malen Gauze, MD;  Location: [facility];  Service: Urology;  Laterality: Bilateral;  . CYSTOSCOPY WITH RETROGRADE PYELOGRAM, URETEROSCOPY AND STENT PLACEMENT Left 04/02/2015   Procedure: CYSTOSCOPY WITH RETROGRADE PYELOGRAM, URETEROSCOPY REMOVAL BILATERAL STENTS;  Surgeon: Malen Gauze, MD;  Location: [facility];  Service: Urology;  Laterality: Left;  . HOLMIUM LASER APPLICATION Right 03/16/2015   Procedure: HOLMIUM LASER APPLICATION;  Surgeon: Malen Gauze, MD;  Location: [facility];  Service: Urology;  Laterality: Right;  . LAPAROSCOPIC APPENDECTOMY N/A 09/17/2013   Procedure: APPENDECTOMY LAPAROSCOPIC;  Surgeon: Shelly Rubenstein, MD;  Location: [facility];  Service: General;  Laterality: N/A;  . LEFT HEART CATHETERIZATION WITH CORONARY/GRAFT ANGIOGRAM N/A 10/19/2011   Procedure: LEFT HEART CATHETERIZATION WITH Isabel Caprice;  Surgeon: Herby Abraham, MD;  Location: [facility] CATH [facility];  Service: Cardiovascular;  Laterality: N/A;  . ORTHOPEDIC SURGERY    . TONSILLECTOMY     Current Outpatient Medications  on File Prior to Visit  Medication Sig Dispense Refill  . atorvastatin (LIPITOR) 80 MG tablet TAKE 1 TABLET BY MOUTH ONCE DAILY AT  6  PM 90 tablet 1  . baclofen (LIORESAL) 10 MG tablet TAKE 1 TABLET BY MOUTH THREE TIMES DAILY 90 tablet 3  . clonazePAM (KLONOPIN) 1 MG tablet TAKE 1 TABLET BY MOUTH TWICE DAILY AS NEEDED FOR ANXIETY 60 tablet 0  . clopidogrel (PLAVIX) 75 MG tablet TAKE 1 TABLET BY MOUTH ONCE DAILY IN THE MORNING 90 tablet 3  . fenofibrate  (TRICOR) 48 MG tablet TAKE 1 TABLET BY MOUTH ONCE DAILY 90 tablet 3  . gabapentin (NEURONTIN) 300 MG capsule 1 tab po qam, 1 tab po qpm and 2 tabs qhs 120 capsule 5  . Icosapent Ethyl (VASCEPA) 1 g CAPS 2 grams po bid with meals 120 capsule 5  . isosorbide mononitrate (IMDUR) 60 MG 24 hr tablet TAKE 1 TABLET BY MOUTH ONCE DAILY 90 tablet 0  . losartan (COZAAR) 100 MG tablet TAKE 1 TABLET BY MOUTH ONCE DAILY 90 tablet 1  . metoprolol tartrate (LOPRESSOR) 25 MG tablet TAKE 1 TABLET BY MOUTH TWICE DAILY 180 tablet 1  . Multiple Vitamin (MULTI-VITAMIN DAILY PO) Take 1 Dose by mouth daily. [employer] vitamin pack takes 1 packet by mouth daily    . nitroGLYCERIN (NITROSTAT) 0.4 MG SL tablet Place 1 tablet (0.4 mg total) under the tongue every 5 (five) minutes as needed for chest pain. 25 tablet 6  . omega-3 acid ethyl esters (LOVAZA) 1 g capsule Take 1 capsule (1 g total) by mouth 2 (two) times daily. 60 capsule 0  . ranolazine (RANEXA) 500 MG 12 hr tablet TAKE 1 TABLET BY MOUTH TWICE DAILY 180 tablet 0  . HYDROcodone-acetaminophen (NORCO) 7.5-325 MG tablet Take 1 tablet by mouth every 6 (six) hours as needed for moderate pain. (Patient not taking: Reported on 10/03/2018) 30 tablet 0   No current facility-administered medications on file prior to visit.    Allergies  Allergen Reactions  . Celexa [Citalopram] Itching and Rash  . Codeine Itching   Social History   Socioeconomic History  . Marital status: Divorced    Spouse name: Not on file  . Number of children: 4  . Years of education: Not on file  . Highest education level: Not on file  Occupational History  . Not on file  Social Needs  . Financial resource strain: Not on file  . Food insecurity:    Worry: Not on file    Inability: Not on file  . Transportation needs:    Medical: Not on file    Non-medical: Not on file  Tobacco Use  . Smoking status: Current Every Day Smoker    Packs/day: 0.50    Years: 30.00    Pack years: 15.00     Types: Cigarettes  . Smokeless tobacco: Former Neurosurgeon    Types: Snuff    Quit date: 07/20/1994  Substance and Sexual Activity  . Alcohol use: Yes    Alcohol/week: 0.0 standard drinks    Comment: may have a drink every few months  . Drug use: No    Comment: former   . Sexual activity: Not Currently  Lifestyle  . Physical activity:    Days per week: Not on file    Minutes per session: Not on file  . Stress: Not on file  Relationships  . Social connections:    Talks on phone: Not on file  Gets together: Not on file    Attends religious service: Not on file    Active member of club or organization: Not on file    Attends meetings of clubs or organizations: Not on file    Relationship status: Not on file  . Intimate partner violence:    Fear of current or ex partner: Not on file    Emotionally abused: Not on file    Physically abused: Not on file    Forced sexual activity: Not on file  Other Topics Concern  . Not on file  Social History Narrative   Lives with girlfriend and two children.  He has four children.        Review of Systems  Musculoskeletal: Positive for back pain.  All other systems reviewed and are negative.      Objective:   Physical Exam  Constitutional: He appears well-developed and well-nourished. No distress.  Cardiovascular: Normal rate, regular rhythm and normal heart sounds.  Pulmonary/Chest: Effort normal and breath sounds normal. No respiratory distress. He has no wheezes. He has no rales.  Musculoskeletal:     Lumbar back: He exhibits decreased range of motion, tenderness, pain and spasm.  Skin: He is not diaphoretic.  Vitals reviewed.         Assessment & Plan:  Acute right-sided low back pain with sciatica, sciatica laterality unspecified  Acute midline low back pain with left-sided sciatica - Plan: DG Lumbar Spine Complete  Patient's back pain is worsening.  He is now having left-sided sciatica as well as crepitus in his lower back  with forward flexion.  Anticipate that the facet arthritis has worsened on the left side at L5-S1 likely causing the crepitus.  Also suspect that the bulging disc at that level is also causing left-sided nerve root impingement.  I question if the patient may be a good candidate for epidural steroid or facet joint injections.  He previously saw orthopedics in 2016 and at that time it was deferred however he has now stopped his narcotic pain medication because it is no longer helping.  He is on baclofen 3 times a day with minimal relief.  He is taking gabapentin with very little relief.  Therefore patient may require steroid injections at this point as he is unable to take NSAIDs due to his coronary artery disease and thinners

## 2018-10-11 ENCOUNTER — Other Ambulatory Visit: Payer: Self-pay

## 2018-10-11 ENCOUNTER — Telehealth: Payer: Self-pay | Admitting: Family Medicine

## 2018-10-11 ENCOUNTER — Ambulatory Visit
Admission: RE | Admit: 2018-10-11 | Discharge: 2018-10-11 | Disposition: A | Discharge status: Home / Self Care | Payer: Medicare Other | Source: Ambulatory Visit | Attending: Family Medicine | Admitting: Family Medicine

## 2018-10-11 ENCOUNTER — Other Ambulatory Visit: Payer: Self-pay | Admitting: Family Medicine

## 2018-10-11 DIAGNOSIS — M545 Low back pain: Secondary | ICD-10-CM | POA: Diagnosis not present

## 2018-10-11 DIAGNOSIS — M5442 Lumbago with sciatica, left side: Secondary | ICD-10-CM

## 2018-10-11 NOTE — Telephone Encounter (Signed)
Patient called in stating that he is having chest pain. He has taken 2 Nitroglycerin tablets and aspirin and chest pain is still present. Advised patient to go to ER right away. Patient verbalized understanding.

## 2018-10-11 NOTE — Telephone Encounter (Signed)
Ok to refill Clonazepam??  Last office visit 10/03/2018.  Last refill 09/12/2018.

## 2018-10-17 ENCOUNTER — Other Ambulatory Visit: Payer: Self-pay | Admitting: Family Medicine

## 2018-10-17 ENCOUNTER — Telehealth: Payer: Self-pay | Admitting: Family Medicine

## 2018-10-17 DIAGNOSIS — M5442 Lumbago with sciatica, left side: Secondary | ICD-10-CM

## 2018-10-17 MED ORDER — HYDROCODONE-ACETAMINOPHEN 7.5-325 MG PO TABS: 1.0000 | ORAL_TABLET | Freq: Four times a day (QID) | ORAL | 0 refills | Status: DC | PRN

## 2018-10-17 NOTE — Telephone Encounter (Signed)
I refilled

## 2018-10-17 NOTE — Telephone Encounter (Signed)
Patient called in stating that he is still having low back pain and wanted to proceed with ordering of MRI. I advised patient that due to COVID-19 there would be a delay in scheduling. Patient verbalized understanding and asked if he could have a refill on his pain medications. Pain medication last fille on 07/25/2018 with #30. Please advise?

## 2018-10-18 NOTE — Telephone Encounter (Signed)
Left message return call

## 2018-10-27 NOTE — Telephone Encounter (Signed)
Spoke with patient and informed him that Medication was refilled

## 2018-11-11 ENCOUNTER — Other Ambulatory Visit: Payer: Self-pay | Admitting: Family Medicine

## 2018-11-11 NOTE — Telephone Encounter (Signed)
Requesting refill    Baclofen  LOV:  10/03/2018  LRF:   10/11/18

## 2018-11-14 ENCOUNTER — Other Ambulatory Visit: Payer: Self-pay | Admitting: Family Medicine

## 2018-11-14 NOTE — Telephone Encounter (Signed)
Ok to refill??  Last office visit 10/03/2018.  Last refill 10/11/2018.

## 2018-11-22 ENCOUNTER — Telehealth: Payer: Self-pay | Admitting: Family Medicine

## 2018-11-22 ENCOUNTER — Other Ambulatory Visit: Payer: Self-pay | Admitting: Family Medicine

## 2018-11-22 MED ORDER — TIZANIDINE HCL 4 MG PO TABS: 4.0000 mg | ORAL_TABLET | Freq: Four times a day (QID) | ORAL | 0 refills | Status: DC | PRN

## 2018-11-22 NOTE — Telephone Encounter (Signed)
Pt called and stated that he is schedule to have MRI done 12/07/18 and would like to know if you would increase his pain med to something stronger? What he is taking now is not helping.

## 2018-11-22 NOTE — Telephone Encounter (Signed)
Add zanaflex 4 q 8 hrs prn back pain

## 2018-11-23 NOTE — Telephone Encounter (Signed)
[facility] - Is pt taking Baclofen for back ?

## 2018-12-01 NOTE — Telephone Encounter (Signed)
No return call - closing note 

## 2018-12-07 ENCOUNTER — Other Ambulatory Visit: Payer: Medicare Other

## 2018-12-13 ENCOUNTER — Other Ambulatory Visit: Payer: Self-pay | Admitting: Family Medicine

## 2018-12-13 NOTE — Telephone Encounter (Signed)
Requested Prescriptions   Pending Prescriptions Disp Refills  . clonazePAM (KLONOPIN) 1 MG tablet [Pharmacy Med Name: clonazePAM 1 MG Oral Tablet] 60 tablet 0    Sig: Take 1 tablet by mouth twice daily as needed for anxiety   Signed Prescriptions Disp Refills  . isosorbide mononitrate (IMDUR) 60 MG 24 hr tablet 90 tablet 1    Sig: Take 1 tablet by mouth once daily    Authorizing Provider: Lynnea Ferrier T    Ordering User: Ailene Ards C  . metoprolol tartrate (LOPRESSOR) 25 MG tablet 180 tablet 1    Sig: Take 1 tablet by mouth twice daily    Authorizing Provider: Lynnea Ferrier T    Ordering User: Ailene Ards C  . baclofen (LIORESAL) 10 MG tablet 90 tablet 1    Sig: TAKE 1 TABLET BY MOUTH THREE TIMES DAILY    Authorizing Provider: Donita Brooks    Ordering User: Ailene Ards C   Last OV 10/17/2018 Last filled 11/14/2018

## 2018-12-17 ENCOUNTER — Other Ambulatory Visit: Payer: Self-pay

## 2018-12-17 ENCOUNTER — Ambulatory Visit
Admission: RE | Admit: 2018-12-17 | Discharge: 2018-12-17 | Disposition: A | Discharge status: Home / Self Care | Payer: Medicare Other | Source: Ambulatory Visit | Attending: Family Medicine | Admitting: Family Medicine

## 2018-12-17 DIAGNOSIS — M5442 Lumbago with sciatica, left side: Secondary | ICD-10-CM

## 2018-12-17 DIAGNOSIS — M48061 Spinal stenosis, lumbar region without neurogenic claudication: Secondary | ICD-10-CM | POA: Diagnosis not present

## 2018-12-26 ENCOUNTER — Other Ambulatory Visit: Payer: Self-pay | Admitting: Family Medicine

## 2018-12-26 DIAGNOSIS — M5440 Lumbago with sciatica, unspecified side: Secondary | ICD-10-CM

## 2018-12-26 DIAGNOSIS — G8929 Other chronic pain: Secondary | ICD-10-CM

## 2019-01-04 ENCOUNTER — Telehealth: Payer: Self-pay | Admitting: Family Medicine

## 2019-01-04 NOTE — Telephone Encounter (Signed)
Pt calling states that he really needs something else for pain. He is in a lot of pain and waiting on the referral. Please Advise.   (the referral has been started however they are prob not seeing many in office at this time)

## 2019-01-05 NOTE — Telephone Encounter (Signed)
NTBS.

## 2019-01-05 NOTE — Telephone Encounter (Signed)
Pt aware to schedule apt via vm 

## 2019-01-12 ENCOUNTER — Other Ambulatory Visit: Payer: Self-pay | Admitting: Family Medicine

## 2019-01-12 NOTE — Telephone Encounter (Signed)
Ok to refill Klonopin?  Last office visit 10/03/2018.  Last refill 12/13/2018.

## 2019-01-23 ENCOUNTER — Ambulatory Visit: Payer: Medicare Other | Admitting: Family Medicine

## 2019-01-30 ENCOUNTER — Other Ambulatory Visit: Payer: Self-pay

## 2019-01-30 ENCOUNTER — Encounter: Payer: Self-pay | Admitting: Family Medicine

## 2019-01-30 ENCOUNTER — Ambulatory Visit (INDEPENDENT_AMBULATORY_CARE_PROVIDER_SITE_OTHER): Payer: Medicare Other | Admitting: Family Medicine

## 2019-01-30 VITALS — BP 126/74 | HR 88 | Temp 98.0°F | Resp 18 | Ht 72.0 in | Wt 270.0 lb

## 2019-01-30 DIAGNOSIS — M5432 Sciatica, left side: Secondary | ICD-10-CM | POA: Diagnosis not present

## 2019-01-30 DIAGNOSIS — I2510 Atherosclerotic heart disease of native coronary artery without angina pectoris: Secondary | ICD-10-CM | POA: Diagnosis not present

## 2019-01-30 MED ORDER — OXYCODONE-ACETAMINOPHEN 7.5-325 MG PO TABS: 1.0000 | ORAL_TABLET | ORAL | 0 refills | Status: DC | PRN

## 2019-01-30 MED ORDER — PREDNISONE 20 MG PO TABS: 40.0000 mg | ORAL_TABLET | Freq: Every day | ORAL | 0 refills | Status: DC

## 2019-01-30 NOTE — Progress Notes (Signed)
Subjective:    Patient ID: [name], male    DOB: 03/21/1976, 43 y.o.   MRN: [id number]  Back Pain    MRI 2016 revealed: Worsening of degenerative disc disease at L5-S1 with a shallow broad-based disc herniation more prominent in the left posterior lateral direction. Narrowing of the subarticular lateral recesses, left more than right, which could cause neural compression. Facet degeneration at L4-5 that could be associated with low back pain. This is also worsened since 2013.   10/03/18 Patient has chronic longstanding low back pain.  His most recent MRI of his lumbar spine was obtained in 2016 and is dictated above.  Patient states for the last month, the pain in the center of his lower back has intensified.  On 4 separate occasions, he bent over and felt a popping sensation on his lower left side.  He also reports numbness radiating down his whole leg.  This occurs almost on a daily basis.  Is particularly worse after prolonged standing and walking.  He also occasionally has pain radiating to his anterior left hip and also into his gluteus.  He has a negative straight leg raise today.  He has muscle strength 5/5 equal and symmetric in both lower extremities.  There is no sign of muscle weakness.  Reflexes are normal at the knee as well as at the Achilles tendon.  He does have tenderness to palpation at the lumbar bar paraspinal muscles left greater than right at approximate the level of L5.  At that time, my plan was: Patient's back pain is worsening.  He is now having left-sided sciatica as well as crepitus in his lower back with forward flexion.  Anticipate that the facet arthritis has worsened on the left side at L5-S1 likely causing the crepitus.  Also suspect that the bulging disc at that level is also causing left-sided nerve root impingement.  I question if the patient may be a good candidate for epidural steroid or facet joint injections.  He previously saw orthopedics in 2016  and at that time it was deferred however he has now stopped his narcotic pain medication because it is no longer helping.  He is on baclofen 3 times a day with minimal relief.  He is taking gabapentin with very little relief.  Therefore patient may require steroid injections at this point as he is unable to take NSAIDs due to his coronary artery disease and thinners   01/30/19 Repeat MRI was obtained at the end of May: IMPRESSION: 1. Shallow broad-based posterior disc protrusion at L5-S1 with resultant mild to moderate lateral recess narrowing, potentially affecting either of the descending S1 nerve roots. 2. Moderate bilateral L5 foraminal stenosis related to disc bulge, reactive endplate changes, and facet hypertrophy. 3. Multifactorial degenerative changes at L4-5 with resultant mild canal with left lateral recess narrowing, with mild bilateral L4 foraminal stenosis.  Patient requested referral to neurosurgery however due to the COVID-19 pandemic, for some reason this never happened.  He continues to complain of severe low back pain at L5.  Simply touching his back in that area elicits severe pain.  He also complains of pain and weakness radiating down his left leg.  He states that his left leg gives way on him at times and he will fall.  He has normal reflexes 2/4 checked at the patella.  Achilles reflexes normal.  Muscle strength is 5/5 equal and symmetric in both lower extremities when the patient is giving full effort.  However he  reports numbness and tingling and neuropathic pain all the way down the left leg.  He denies any saddle anesthesia.  Past Medical History:  Diagnosis Date  . Anxiety   . Arthritis   . CHF (congestive heart failure) (HCC)   . Complication of anesthesia    difficulty awakening following appendectomy   . Dizziness    with humidity   . Hx of CABG   . Hyperlipidemia   . Hypertension   . Kidney stones   . MYOCARDIAL INFARCTION 10/13/2008   Qualifier: Diagnosis  of  By: Juanda ChanceBrodie, MD, Johny ChessFACC, Bruce Rogers   . Numbness in right leg    pt relates to sciatica  . Tobacco user    Past Surgical History:  Procedure Laterality Date  . APPENDECTOMY    . CARDIAC CATHETERIZATION    . CORONARY ARTERY BYPASS GRAFT     status post diaphragmatic wall infarction Rx BMS RCA 03-11-08   . CORONARY STENT PLACEMENT    . CYSTOSCOPY WITH RETROGRADE PYELOGRAM, URETEROSCOPY AND STENT PLACEMENT Bilateral 03/16/2015   Procedure: CYSTOSCOPY WITH BILATERAL RETROGRADE PYELOGRAM, RIGHT URETEROSCOPY, STONE BASKETRY WITH LASER LITHOTRIPSY ON THE RIGHT,  AND BILATERAL STENT PLACEMENT;  Surgeon: Malen GauzePatrick L McKenzie, MD;  Location: [facility];  Service: Urology;  Laterality: Bilateral;  . CYSTOSCOPY WITH RETROGRADE PYELOGRAM, URETEROSCOPY AND STENT PLACEMENT Left 04/02/2015   Procedure: CYSTOSCOPY WITH RETROGRADE PYELOGRAM, URETEROSCOPY REMOVAL BILATERAL STENTS;  Surgeon: Malen GauzePatrick L McKenzie, MD;  Location: [facility];  Service: Urology;  Laterality: Left;  . HOLMIUM LASER APPLICATION Right 03/16/2015   Procedure: HOLMIUM LASER APPLICATION;  Surgeon: Malen GauzePatrick L McKenzie, MD;  Location: [facility];  Service: Urology;  Laterality: Right;  . LAPAROSCOPIC APPENDECTOMY N/A 09/17/2013   Procedure: APPENDECTOMY LAPAROSCOPIC;  Surgeon: Shelly Rubensteinouglas A Blackman, MD;  Location: [facility];  Service: General;  Laterality: N/A;  . LEFT HEART CATHETERIZATION WITH CORONARY/GRAFT ANGIOGRAM N/A 10/19/2011   Procedure: LEFT HEART CATHETERIZATION WITH Isabel CapriceORONARY/GRAFT ANGIOGRAM;  Surgeon: Herby Abrahamhomas D Stuckey, MD;  [facility];  Service: Cardiovascular;  Laterality: N/A;  . ORTHOPEDIC SURGERY    . TONSILLECTOMY     Current Outpatient Medications on File Prior to Visit  Medication Sig Dispense Refill  . atorvastatin (LIPITOR) 80 MG tablet TAKE 1 TABLET BY MOUTH ONCE DAILY AT 6 PM 90 tablet 0  . baclofen (LIORESAL) 10 MG tablet TAKE 1 TABLET BY MOUTH THREE TIMES DAILY 90 tablet 1  . clonazePAM (KLONOPIN) 1 MG tablet Take 1 tablet by  mouth twice daily as needed for anxiety 60 tablet 0  . clopidogrel (PLAVIX) 75 MG tablet TAKE 1 TABLET BY MOUTH ONCE DAILY IN THE MORNING 90 tablet 3  . fenofibrate (TRICOR) 48 MG tablet TAKE 1 TABLET BY MOUTH ONCE DAILY 90 tablet 3  . gabapentin (NEURONTIN) 300 MG capsule 1 tab po qam, 1 tab po qpm and 2 tabs qhs 120 capsule 5  . HYDROcodone-acetaminophen (NORCO) 7.5-325 MG tablet Take 1 tablet by mouth every 6 (six) hours as needed for moderate pain. 30 tablet 0  . Icosapent Ethyl (VASCEPA) 1 g CAPS 2 grams po bid with meals 120 capsule 5  . isosorbide mononitrate (IMDUR) 60 MG 24 hr tablet Take 1 tablet by mouth once daily 90 tablet 1  . losartan (COZAAR) 100 MG tablet Take 1 tablet by mouth once daily 90 tablet 2  . metoprolol tartrate (LOPRESSOR) 25 MG tablet Take 1 tablet by mouth twice daily 180 tablet 1  . Multiple Vitamin (MULTI-VITAMIN DAILY PO) Take 1  Dose by mouth daily. [employer] vitamin pack takes 1 packet by mouth daily    . nitroGLYCERIN (NITROSTAT) 0.4 MG SL tablet Place 1 tablet (0.4 mg total) under the tongue every 5 (five) minutes as needed for chest pain. 25 tablet 6  . omega-3 acid ethyl esters (LOVAZA) 1 g capsule Take 1 capsule (1 g total) by mouth 2 (two) times daily. 60 capsule 0  . ranolazine (RANEXA) 500 MG 12 hr tablet Take 1 tablet by mouth twice daily 180 tablet 0  . tiZANidine (ZANAFLEX) 4 MG tablet Take 1 tablet (4 mg total) by mouth every 6 (six) hours as needed for muscle spasms. 30 tablet 0   No current facility-administered medications on file prior to visit.    Allergies  Allergen Reactions  . Celexa [Citalopram] Itching and Rash  . Codeine Itching   Social History   Socioeconomic History  . Marital status: Divorced    Spouse name: Not on file  . Number of children: 4  . Years of education: Not on file  . Highest education level: Not on file  Occupational History  . Not on file  Social Needs  . Financial resource strain: Not on file  . Food insecurity     Worry: Not on file    Inability: Not on file  . Transportation needs    Medical: Not on file    Non-medical: Not on file  Tobacco Use  . Smoking status: Current Every Day Smoker    Packs/day: 0.50    Years: 30.00    Pack years: 15.00    Types: Cigarettes  . Smokeless tobacco: Former Systems developer    Types: Snuff    Quit date: 07/20/1994  Substance and Sexual Activity  . Alcohol use: Yes    Alcohol/week: 0.0 standard drinks    Comment: may have a drink every few months  . Drug use: No    Comment: former   . Sexual activity: Not Currently  Lifestyle  . Physical activity    Days per week: Not on file    Minutes per session: Not on file  . Stress: Not on file  Relationships  . Social Herbalist on phone: Not on file    Gets together: Not on file    Attends religious service: Not on file    Active member of club or organization: Not on file    Attends meetings of clubs or organizations: Not on file    Relationship status: Not on file  . Intimate partner violence    Fear of current or ex partner: Not on file    Emotionally abused: Not on file    Physically abused: Not on file    Forced sexual activity: Not on file  Other Topics Concern  . Not on file  Social History Narrative   Lives with girlfriend and two children.  He has four children.        Review of Systems  Musculoskeletal: Positive for back pain.  All other systems reviewed and are negative.      Objective:   Physical Exam  Constitutional: He appears well-developed and well-nourished. No distress.  Cardiovascular: Normal rate, regular rhythm and normal heart sounds.  Pulmonary/Chest: Effort normal and breath sounds normal. No respiratory distress. He has no wheezes. He has no rales.  Musculoskeletal:     Lumbar back: He exhibits decreased range of motion, tenderness, pain and spasm.  Skin: He is not diaphoretic.  Vitals reviewed.  Assessment & Plan:  1. Coronary artery disease  involving native coronary artery of native heart without angina pectoris 2. Left sided sciatica   Due to his history of coronary artery disease and his use of Plavix, I do not want to keep the patient on longstanding NSAIDs.  Therefore we will start the patient on prednisone 40 mg a day for 10 days in an effort to try to reduce any inflammation and swelling due to the bulging disks possibly pinching the left-sided nerve root.  I will also switch the patient to Percocet 7.5/325 1 p.o. 3 times daily as needed pain and give him 30 tablets and consult neurosurgery for possible epidural steroid injections.

## 2019-02-10 ENCOUNTER — Other Ambulatory Visit: Payer: Self-pay | Admitting: Family Medicine

## 2019-02-10 NOTE — Telephone Encounter (Signed)
Requested Prescriptions   Pending Prescriptions Disp Refills  . gabapentin (NEURONTIN) 300 MG capsule [Pharmacy Med Name: Gabapentin 300 MG Oral Capsule] 120 capsule 0    Sig: TAKE 1 CAPSULE BY MOUTH IN THE MORNING AND 1 CAP IN THE EVENING AND 2 CAPS AT BEDTIME  . clonazePAM (KLONOPIN) 1 MG tablet [Pharmacy Med Name: clonazePAM 1 MG Oral Tablet] 60 tablet 0    Sig: Take 1 tablet by mouth twice daily as needed for anxiety  . baclofen (LIORESAL) 10 MG tablet [Pharmacy Med Name: Baclofen 10 MG Oral Tablet] 90 tablet 0    Sig: TAKE 1 TABLET BY MOUTH THREE TIMES DAILY    Last OV 01/30/2019  Last written 01/12/2019

## 2019-02-10 NOTE — Telephone Encounter (Signed)
Ok to refill 

## 2019-03-06 ENCOUNTER — Other Ambulatory Visit: Payer: Self-pay | Admitting: Family Medicine

## 2019-03-06 MED ORDER — OXYCODONE-ACETAMINOPHEN 7.5-325 MG PO TABS: 1.0000 | ORAL_TABLET | ORAL | 0 refills | Status: DC | PRN

## 2019-03-06 NOTE — Telephone Encounter (Signed)
Patient requesting a refill on Oxycodone     LOV:      01/30/19  LRF:      01/30/19

## 2019-03-14 ENCOUNTER — Other Ambulatory Visit: Payer: Self-pay | Admitting: Family Medicine

## 2019-03-14 NOTE — Telephone Encounter (Signed)
Requesting refill    Klonopin, Baclofen & gabapentin  LOV: 01/30/19  LRF:   02/10/19 (All 3)

## 2019-04-13 ENCOUNTER — Other Ambulatory Visit: Payer: Self-pay | Admitting: Family Medicine

## 2019-04-13 NOTE — Telephone Encounter (Signed)
Ok to refill??  Last office visit 01/30/2019.  Last refill 03/14/2019 on both.

## 2019-05-01 ENCOUNTER — Ambulatory Visit: Payer: Medicare Other | Admitting: Cardiovascular Disease

## 2019-05-11 ENCOUNTER — Other Ambulatory Visit: Payer: Self-pay | Admitting: Family Medicine

## 2019-05-11 NOTE — Telephone Encounter (Signed)
Ok to refill Klonopin and Baclofen??  Last office visit 01/30/2019.  Last refill 04/13/2019 on both.

## 2019-05-24 ENCOUNTER — Other Ambulatory Visit: Payer: Self-pay | Admitting: Family Medicine

## 2019-05-24 NOTE — Telephone Encounter (Signed)
Patient requesting a refill on Oxycodone     LOV:  01/30/19  LRF:      03/06/19

## 2019-05-25 MED ORDER — OXYCODONE-ACETAMINOPHEN 7.5-325 MG PO TABS: 1.0000 | ORAL_TABLET | ORAL | 0 refills | Status: DC | PRN

## 2019-06-06 ENCOUNTER — Ambulatory Visit: Payer: Medicare Other | Admitting: Physician Assistant

## 2019-06-12 ENCOUNTER — Other Ambulatory Visit: Payer: Self-pay | Admitting: Family Medicine

## 2019-06-12 NOTE — Telephone Encounter (Signed)
Ok to refill??  Last office visit 01/30/2019.  Last refill 05/11/2019.

## 2019-06-17 ENCOUNTER — Other Ambulatory Visit: Payer: Self-pay | Admitting: Family Medicine

## 2019-06-19 NOTE — Telephone Encounter (Signed)
Ok to refill 

## 2019-06-20 ENCOUNTER — Other Ambulatory Visit: Payer: Self-pay | Admitting: Family Medicine

## 2019-06-20 MED ORDER — ATORVASTATIN CALCIUM 80 MG PO TABS: ORAL_TABLET | ORAL | 2 refills | Status: DC

## 2019-06-20 MED ORDER — CLOPIDOGREL BISULFATE 75 MG PO TABS: ORAL_TABLET | ORAL | 2 refills | Status: DC

## 2019-06-20 MED ORDER — GABAPENTIN 300 MG PO CAPS: ORAL_CAPSULE | ORAL | 2 refills | Status: DC

## 2019-06-20 MED ORDER — FENOFIBRATE 48 MG PO TABS: 48.0000 mg | ORAL_TABLET | Freq: Every day | ORAL | 2 refills | Status: DC

## 2019-06-20 MED ORDER — LOSARTAN POTASSIUM 100 MG PO TABS: 100.0000 mg | ORAL_TABLET | Freq: Every day | ORAL | 2 refills | Status: DC

## 2019-06-20 MED ORDER — ISOSORBIDE MONONITRATE ER 60 MG PO TB24: 60.0000 mg | ORAL_TABLET | Freq: Every day | ORAL | 2 refills | Status: DC

## 2019-06-20 MED ORDER — RANOLAZINE ER 500 MG PO TB12: 500.0000 mg | ORAL_TABLET | Freq: Two times a day (BID) | ORAL | 2 refills | Status: DC

## 2019-06-20 MED ORDER — METOPROLOL TARTRATE 25 MG PO TABS: 25.0000 mg | ORAL_TABLET | Freq: Two times a day (BID) | ORAL | 2 refills | Status: DC

## 2019-06-20 NOTE — Telephone Encounter (Signed)
[facility] called and states that pt need rx's sent to them and listed several. Also listed Klonopin - per Dr. Dennard Schaumann we will not send Klonopin through the mail. He will need to continue getting that at a local pharmacy. Note placed on Losartan rx that we will not fill mail order. All other meds sent for renewal.

## 2019-07-05 ENCOUNTER — Other Ambulatory Visit: Payer: Self-pay

## 2019-07-05 NOTE — Telephone Encounter (Signed)
Requested Prescriptions   Pending Prescriptions Disp Refills  . baclofen (LIORESAL) 10 MG tablet 90 tablet 0    Sig: Take 1 tablet (10 mg total) by mouth 3 (three) times daily.  . clonazePAM (KLONOPIN) 1 MG tablet 60 tablet 0    Sig: Take 1 tablet (1 mg total) by mouth 2 (two) times daily as needed. for anxiety    Last OV 01/30/2019   Last written 06/12/2019  Pt has switched his pharmacy from [facility] to [facility] and would like these 2 meds transferred.

## 2019-07-06 MED ORDER — CLONAZEPAM 1 MG PO TABS: 1.0000 mg | ORAL_TABLET | Freq: Two times a day (BID) | ORAL | 0 refills | Status: DC | PRN

## 2019-07-06 MED ORDER — BACLOFEN 10 MG PO TABS: 10.0000 mg | ORAL_TABLET | Freq: Three times a day (TID) | ORAL | 0 refills | Status: DC

## 2019-07-10 ENCOUNTER — Other Ambulatory Visit: Payer: Self-pay | Admitting: Family Medicine

## 2019-07-10 MED ORDER — OXYCODONE-ACETAMINOPHEN 7.5-325 MG PO TABS: 1.0000 | ORAL_TABLET | ORAL | 0 refills | Status: DC | PRN

## 2019-07-10 NOTE — Telephone Encounter (Signed)
Patient requesting a refill on Oxycodone     LOV:  01/30/19  LRF:   05/25/2019

## 2019-07-18 ENCOUNTER — Other Ambulatory Visit: Payer: Self-pay

## 2019-07-18 MED ORDER — ICOSAPENT ETHYL 1 G PO CAPS: ORAL_CAPSULE | ORAL | 0 refills | Status: DC

## 2019-07-20 ENCOUNTER — Ambulatory Visit (INDEPENDENT_AMBULATORY_CARE_PROVIDER_SITE_OTHER): Payer: Medicare Other | Admitting: Family Medicine

## 2019-07-20 DIAGNOSIS — F419 Anxiety disorder, unspecified: Secondary | ICD-10-CM | POA: Diagnosis not present

## 2019-07-20 DIAGNOSIS — M5137 Other intervertebral disc degeneration, lumbosacral region: Secondary | ICD-10-CM | POA: Insufficient documentation

## 2019-07-20 DIAGNOSIS — M51379 Other intervertebral disc degeneration, lumbosacral region without mention of lumbar back pain or lower extremity pain: Secondary | ICD-10-CM | POA: Insufficient documentation

## 2019-07-20 DIAGNOSIS — M5136 Other intervertebral disc degeneration, lumbar region: Secondary | ICD-10-CM | POA: Diagnosis not present

## 2019-07-20 DIAGNOSIS — E7800 Pure hypercholesterolemia, unspecified: Secondary | ICD-10-CM

## 2019-07-20 DIAGNOSIS — F17210 Nicotine dependence, cigarettes, uncomplicated: Secondary | ICD-10-CM | POA: Diagnosis not present

## 2019-07-20 DIAGNOSIS — I25119 Atherosclerotic heart disease of native coronary artery with unspecified angina pectoris: Secondary | ICD-10-CM | POA: Diagnosis not present

## 2019-07-20 DIAGNOSIS — F17200 Nicotine dependence, unspecified, uncomplicated: Secondary | ICD-10-CM

## 2019-07-20 MED ORDER — NITROGLYCERIN 0.4 MG SL SUBL: 0.4000 mg | SUBLINGUAL_TABLET | SUBLINGUAL | 6 refills | Status: AC | PRN

## 2019-07-20 MED ORDER — DULOXETINE HCL 60 MG PO CPEP: 60.0000 mg | ORAL_CAPSULE | Freq: Every day | ORAL | 3 refills | Status: DC

## 2019-07-20 NOTE — Progress Notes (Signed)
Virtual Visit via Telephone Note  I connected with [name], on 07/20/2019 at 10:28 AM by telephone due to the COVID-19 pandemic and verified that I am speaking with the correct person using two identifiers.   Consent: I discussed the limitations, risks, security and privacy concerns of performing an evaluation and management service by telephone and the availability of in person appointments. I also discussed with the patient that there may be a patient responsible charge related to this service. The patient expressed understanding and agreed to proceed.   Location of Patient: Home  Location of Provider: Clinic   Persons participating in Telemedicine visit: Dare Sanger [facility] Dr. Margarita Rana     History of Present Illness: [name] is a 43 year old male with with a history of Tobacco abuse, Hypertension, Hyperlipidemia, CAD s/p CABG x4 (previous PCI), Anxiety, PreDiabetes (A1c6.0 from 07/2018). He is here to establish care; previously followed by Dr Dennard Schaumann but cannot make it to [facility] due to the distance.  He denies the presence of dyspnea, pedal edema but he has chronic chest pains for which he uses Nitroglycerin with resolution of symptoms and is also on Ranexa. He sees Dr Angelena Form at [facility]. He smokes about 5 cig/day. For Anxiety he was prescribed Klonopin by his PCP and has been on this for years; was previously prescribed Xanax.  He is also receiving Percocet (which he states doesn't work) for neck and back pain. He has arthritis in his back and shoulders, disc protrusion, DDD. His arms go numb at night and pain gets to a 10/10. Seen by a Spine specialist and surgery not thought to be an option. MRI Lumbar spine from 12/17/18 revealed: IMPRESSION: 1. Shallow broad-based posterior disc protrusion at L5-S1 with resultant mild to moderate lateral recess narrowing, potentially affecting either of the descending S1 nerve roots. 2. Moderate  bilateral L5 foraminal stenosis related to disc bulge, reactive endplate changes, and facet hypertrophy. 3. Multifactorial degenerative changes at L4-5 with resultant mild canal with left lateral recess narrowing, with mild bilateral L4 foraminal stenosis.  He complains of pain in his shins for one month and denies history of trauma and he described as "if someone hit him with a bat". Pain is worse at night - it starts at night from his back, radiating down his leg to his shins bilaterally.  Past Medical History:  Diagnosis Date  . Anxiety   . Arthritis   . CHF (congestive heart failure) ([place])   . Complication of anesthesia    difficulty awakening following appendectomy   . Dizziness    with humidity   . Hx of CABG   . Hyperlipidemia   . Hypertension   . Kidney stones   . MYOCARDIAL INFARCTION 10/13/2008   Qualifier: Diagnosis of  By: Olevia Perches, MD, Glenetta Hew   . Numbness in right leg    pt relates to sciatica  . Tobacco user    Allergies  Allergen Reactions  . Celexa [Citalopram] Itching and Rash  . Codeine Itching    Current Outpatient Medications on File Prior to Visit  Medication Sig Dispense Refill  . atorvastatin (LIPITOR) 80 MG tablet TAKE 1 TABLET BY MOUTH ONCE DAILY AT 6 PM 90 tablet 2  . baclofen (LIORESAL) 10 MG tablet Take 1 tablet (10 mg total) by mouth 3 (three) times daily. 90 tablet 0  . clonazePAM (KLONOPIN) 1 MG tablet Take 1 tablet (1 mg total) by mouth 2 (two) times daily  as needed. for anxiety 60 tablet 0  . clopidogrel (PLAVIX) 75 MG tablet TAKE 1 TABLET BY MOUTH ONCE DAILY IN THE MORNING 90 tablet 2  . fenofibrate (TRICOR) 48 MG tablet Take 1 tablet (48 mg total) by mouth daily. 90 tablet 2  . gabapentin (NEURONTIN) 300 MG capsule TAKE 1 CAPSULE BY MOUTH IN THE MORNING, TAKE 1 CAPSULE BY MOUTH IN THE EVENING AND TAKE 2 CAPSULES BY MOUTH AT BEDTIME 360 capsule 2  . icosapent Ethyl (VASCEPA) 1 g capsule TAKE 2 CAPSULES BY MOUTH TWICE DAILY WITH  MEALS 120 capsule 0  . isosorbide mononitrate (IMDUR) 60 MG 24 hr tablet Take 1 tablet (60 mg total) by mouth daily. 90 tablet 2  . losartan (COZAAR) 100 MG tablet Take 1 tablet (100 mg total) by mouth daily. 90 tablet 2  . metoprolol tartrate (LOPRESSOR) 25 MG tablet Take 1 tablet (25 mg total) by mouth 2 (two) times daily. 180 tablet 2  . Multiple Vitamin (MULTI-VITAMIN DAILY PO) Take 1 Dose by mouth daily. [employer] vitamin pack takes 1 packet by mouth daily    . nitroGLYCERIN (NITROSTAT) 0.4 MG SL tablet Place 1 tablet (0.4 mg total) under the tongue every 5 (five) minutes as needed for chest pain. 25 tablet 6  . omega-3 acid ethyl esters (LOVAZA) 1 g capsule Take 1 capsule (1 g total) by mouth 2 (two) times daily. 60 capsule 0  . oxyCODONE-acetaminophen (PERCOCET) 7.5-325 MG tablet Take 1 tablet by mouth every 4 (four) hours as needed for severe pain. 30 tablet 0  . ranolazine (RANEXA) 500 MG 12 hr tablet Take 1 tablet (500 mg total) by mouth 2 (two) times daily. 180 tablet 2   No current facility-administered medications on file prior to visit.    Observations/Objective: Alert, awake , oriented x3 Not in acute distress  Lab Results  Component Value Date   HGBA1C 6.0 (H) 07/25/2018    CMP Latest Ref Rng & Units 07/25/2018 02/18/2018 05/01/2017  Glucose 65 - 99 mg/dL 93 161(W109(H) 960(A239(H)  BUN 7 - 25 mg/dL 9 11 9   Creatinine 0.60 - 1.35 mg/dL 5.401.05 9.811.03 1.911.01  Sodium 135 - 146 mmol/L 140 139 138  Potassium 3.5 - 5.3 mmol/L 4.2 4.1 3.4(L)  Chloride 98 - 110 mmol/L 106 101 106  CO2 20 - 32 mmol/L 26 20 22   Calcium 8.6 - 10.3 mg/dL 9.4 9.9 9.3  Total Protein 6.1 - 8.1 g/dL 6.5 7.4 -  Total Bilirubin 0.2 - 1.2 mg/dL 0.5 0.4 -  Alkaline Phos 39 - 117 IU/L - 59 -  AST 10 - 40 U/L 19 20 -  ALT 9 - 46 U/L 21 31 -    Lipid Panel     Component Value Date/Time   CHOL 191 07/25/2018 1446   CHOL 208 (H) 02/18/2018 1026   TRIG 502 (H) 07/25/2018 1446   HDL 36 (L) 07/25/2018 1446   HDL 45 02/18/2018  1026   CHOLHDL 5.3 (H) 07/25/2018 1446   VLDL 67 (H) 05/12/2016 1133   LDLCALC  07/25/2018 1446     Comment:     . LDL cholesterol not calculated. Triglyceride levels greater than 400 mg/dL invalidate calculated LDL results. . Reference range: <100 . Desirable range <100 mg/dL for primary prevention;   <70 mg/dL for patients with CHD or diabetic patients  with > or = 2 CHD risk factors. Marland Kitchen. LDL-C is now calculated using the Martin-Hopkins  calculation, which is a validated novel method providing  better accuracy than the Friedewald equation in the  estimation of LDL-C.  Horald Pollen et al. Lenox Ahr. 7510;258(52): 2061-2068  (http://education.[employer].com/faq/FAQ164)    LDLDIRECT 103.2 06/15/2012 1446   LABVLDL Comment 02/18/2018 1026    Assessment and Plan: 1. Coronary artery disease involving native coronary artery of native heart with angina pectoris (HCC) With unstable angina - none at this time S/p PCI, CABG Risk factor modification emphasized Continue high intensity statin, Ranexa, Nitroglycerin prn - nitroGLYCERIN (NITROSTAT) 0.4 MG SL tablet; Place 1 tablet (0.4 mg total) under the tongue every 5 (five) minutes as needed for chest pain.  Dispense: 25 tablet; Refill: 6  2. TOBACCO USE Spent 3 minutes counseling on smoking cessation and he is not ready to quit yet Failed Chantix in the past  3. Anxiety Stable Discussed detrimental effects of chronic benzodiazepine use and the fact that SSRIs are better options He is agreeable to being tapered off Klonopin as follows: begin 0.5mg  bid until next visit in 1 month after that consider 0.5mg  qd Commence Cymbalta which will also help with pain Consider Hydroxyzine down the road if indicated - DULoxetine (CYMBALTA) 60 MG capsule; Take 1 capsule (60 mg total) by mouth daily.  Dispense: 30 capsule; Refill: 3  4. Degenerative disc disease at L5-S1 level Uncontrolled; shin pains could be radiation of low back pain as he does  have radiculopathy Adding Cymbalta Advised we only prescribe Tylenol #3 and Tramadol in this Clinic; will refer to pain management - DULoxetine (CYMBALTA) 60 MG capsule; Take 1 capsule (60 mg total) by mouth daily.  Dispense: 30 capsule; Refill: 3 - Ambulatory referral to Pain Clinic  5. Pure hypercholesterolemia Controlled Due for repeat labs He will have that at his in person visit next month Continue Statin Low cholesterol diet   Follow Up Instructions: Return in about 1 month (around 08/20/2019) for Klonopin taper and labs.    I discussed the assessment and treatment plan with the patient. The patient was provided an opportunity to ask questions and all were answered. The patient agreed with the plan and demonstrated an understanding of the instructions.   The patient was advised to call back or seek an in-person evaluation if the symptoms worsen or if the condition fails to improve as anticipated.     I provided 24 minutes total of non-face-to-face time during this encounter including median intraservice time, reviewing previous notes, labs, imaging, medications, management and patient verbalized understanding.     Hoy Register, MD, FAAFP. [facility] [place], [place] [phone]   07/20/2019, 10:28 AM

## 2019-08-07 ENCOUNTER — Other Ambulatory Visit: Payer: Self-pay

## 2019-08-07 ENCOUNTER — Encounter (HOSPITAL_COMMUNITY): Payer: Self-pay

## 2019-08-07 ENCOUNTER — Emergency Department (HOSPITAL_COMMUNITY): Payer: Medicare Other

## 2019-08-07 ENCOUNTER — Emergency Department (HOSPITAL_COMMUNITY)
Admission: EM | Admit: 2019-08-07 | Discharge: 2019-08-07 | Disposition: A | Discharge status: Home / Self Care | Payer: Medicare Other | Attending: Emergency Medicine | Admitting: Emergency Medicine

## 2019-08-07 DIAGNOSIS — Z951 Presence of aortocoronary bypass graft: Secondary | ICD-10-CM | POA: Diagnosis not present

## 2019-08-07 DIAGNOSIS — I509 Heart failure, unspecified: Secondary | ICD-10-CM | POA: Insufficient documentation

## 2019-08-07 DIAGNOSIS — R109 Unspecified abdominal pain: Secondary | ICD-10-CM | POA: Diagnosis not present

## 2019-08-07 DIAGNOSIS — I2510 Atherosclerotic heart disease of native coronary artery without angina pectoris: Secondary | ICD-10-CM | POA: Diagnosis not present

## 2019-08-07 DIAGNOSIS — Z79899 Other long term (current) drug therapy: Secondary | ICD-10-CM | POA: Insufficient documentation

## 2019-08-07 DIAGNOSIS — F17210 Nicotine dependence, cigarettes, uncomplicated: Secondary | ICD-10-CM | POA: Diagnosis not present

## 2019-08-07 DIAGNOSIS — I110 Hypertensive heart disease with heart failure: Secondary | ICD-10-CM | POA: Diagnosis not present

## 2019-08-07 DIAGNOSIS — N12 Tubulo-interstitial nephritis, not specified as acute or chronic: Secondary | ICD-10-CM | POA: Diagnosis not present

## 2019-08-07 DIAGNOSIS — R52 Pain, unspecified: Secondary | ICD-10-CM | POA: Diagnosis not present

## 2019-08-07 DIAGNOSIS — N200 Calculus of kidney: Secondary | ICD-10-CM | POA: Diagnosis not present

## 2019-08-07 LAB — COMPREHENSIVE METABOLIC PANEL
ALT: 16 U/L (ref 0–44)
AST: 17 U/L (ref 15–41)
Albumin: 4.2 g/dL (ref 3.5–5.0)
Alkaline Phosphatase: 54 U/L (ref 38–126)
Anion gap: 12 (ref 5–15)
BUN: 11 mg/dL (ref 6–20)
CO2: 22 mmol/L (ref 22–32)
Calcium: 9.6 mg/dL (ref 8.9–10.3)
Chloride: 103 mmol/L (ref 98–111)
Creatinine, Ser: 0.94 mg/dL (ref 0.61–1.24)
GFR calc Af Amer: >60 mL/min (ref >60)
GFR calc non Af Amer: >60 mL/min (ref >60)
Glucose, Bld: 118 mg/dL — ABNORMAL HIGH (ref 70–99)
Potassium: 3.5 mmol/L (ref 3.5–5.1)
Sodium: 137 mmol/L (ref 135–145)
Total Bilirubin: 0.5 mg/dL (ref 0.3–1.2)
Total Protein: 7.6 g/dL (ref 6.5–8.1)

## 2019-08-07 LAB — URINALYSIS, ROUTINE W REFLEX MICROSCOPIC
Bilirubin Urine: NEGATIVE
Glucose, UA: 50 mg/dL — AB
Hgb urine dipstick: NEGATIVE
Ketones, ur: 5 mg/dL — AB
Nitrite: POSITIVE — AB
Protein, ur: 100 mg/dL — AB
Specific Gravity, Urine: 1.021 (ref 1.005–1.030)
WBC, UA: >50 WBC/hpf — ABNORMAL HIGH (ref 0–5)
pH: 6 (ref 5.0–8.0)

## 2019-08-07 LAB — CBC WITH DIFFERENTIAL/PLATELET
Abs Immature Granulocytes: 0.08 10*3/uL — ABNORMAL HIGH (ref 0.00–0.07)
Basophils Absolute: 0.1 10*3/uL (ref 0.0–0.1)
Basophils Relative: 0 %
Eosinophils Absolute: 0.1 10*3/uL (ref 0.0–0.5)
Eosinophils Relative: 0 %
HCT: 44.9 % (ref 39.0–52.0)
Hemoglobin: 15.4 g/dL (ref 13.0–17.0)
Immature Granulocytes: 0 %
Lymphocytes Relative: 11 %
Lymphs Abs: 2 10*3/uL (ref 0.7–4.0)
MCH: 31.7 pg (ref 26.0–34.0)
MCHC: 34.3 g/dL (ref 30.0–36.0)
MCV: 92.4 fL (ref 80.0–100.0)
Monocytes Absolute: 1 10*3/uL (ref 0.1–1.0)
Monocytes Relative: 5 %
Neutro Abs: 15.7 10*3/uL — ABNORMAL HIGH (ref 1.7–7.7)
Neutrophils Relative %: 84 %
Platelets: 418 10*3/uL — ABNORMAL HIGH (ref 150–400)
RBC: 4.86 MIL/uL (ref 4.22–5.81)
RDW: 13.2 % (ref 11.5–15.5)
WBC: 18.8 10*3/uL — ABNORMAL HIGH (ref 4.0–10.5)
nRBC: 0 % (ref 0.0–0.2)

## 2019-08-07 MED ORDER — MORPHINE SULFATE (PF) 4 MG/ML IV SOLN
4.0000 mg | Freq: Once | INTRAVENOUS | Status: AC
  Administered 2019-08-07: 17:00:00 4 mg via INTRAVENOUS
  Filled 2019-08-07: qty 1

## 2019-08-07 MED ORDER — SODIUM CHLORIDE 0.9 % IV SOLN
1.0000 g | Freq: Once | INTRAVENOUS | Status: AC
  Administered 2019-08-07: 1 g via INTRAVENOUS
  Filled 2019-08-07: qty 10

## 2019-08-07 MED ORDER — SODIUM CHLORIDE 0.9 % IV BOLUS
1000.0000 mL | Freq: Once | INTRAVENOUS | Status: AC
  Administered 2019-08-07: 17:00:00 1000 mL via INTRAVENOUS

## 2019-08-07 MED ORDER — ONDANSETRON 4 MG PO TBDP: 4.0000 mg | ORAL_TABLET | Freq: Three times a day (TID) | ORAL | 0 refills | Status: DC | PRN

## 2019-08-07 MED ORDER — MORPHINE SULFATE (PF) 4 MG/ML IV SOLN
4.0000 mg | Freq: Once | INTRAVENOUS | Status: AC
  Administered 2019-08-07: 19:00:00 4 mg via INTRAVENOUS
  Filled 2019-08-07: qty 1

## 2019-08-07 MED ORDER — CEPHALEXIN 500 MG PO CAPS: 500.0000 mg | ORAL_CAPSULE | Freq: Four times a day (QID) | ORAL | 0 refills | Status: DC

## 2019-08-07 NOTE — ED Notes (Signed)
Requested patient to give UA. Patient ambulated restroom with no assist.

## 2019-08-07 NOTE — ED Triage Notes (Signed)
Patient arrived via [facility] from home.   C/O hematuria and left flank pain.   Unable to urinate for 2 days.  Patient states he only has blood output.   Taking Azo and drinking water and unable to pass anything.   Patient is on Plavix    Hx: Kidney stone year ago and had to be surgically removed.   Bp-138/74 p-64 rr-23 Afebrile   Denies being around anyone covid.

## 2019-08-07 NOTE — ED Provider Notes (Signed)
[place] DEPT Provider Note   CSN: [id number] Arrival date & time: 08/07/19  1626     History Chief Complaint  Patient presents with  . Flank Pain  . Hematuria    [name] is a 44 y.o. male.  The history is provided by the patient and medical records. No language interpreter was used.  Flank Pain  Hematuria   [name] is a 44 y.o. male who presents to the Emergency Department complaining of flank pain, hematuria. He presents the emergency department complaining of hematuria and dribbling urination for the last two days. He states that he can only get a few drops out at a time and looks like rust mixed with mucus. He also reports a week and a half of left flank pain that radiates down to his left lower quadrant. He has associated dysuria. Pain is constant in nature. No fevers, cough, abdominal pain. He does have mild nausea. He had similar symptoms in the past that were attributed to a kidney stone that required a surgery. He takes Plavix for history of coronary artery disease. Symptoms are severe, constant, worsening.    Past Medical History:  Diagnosis Date  . Anxiety   . Arthritis   . CHF (congestive heart failure) ([place])   . Complication of anesthesia    difficulty awakening following appendectomy   . Dizziness    with humidity   . Hx of CABG   . Hyperlipidemia   . Hypertension   . Kidney stones   . MYOCARDIAL INFARCTION 10/13/2008   Qualifier: Diagnosis of  By: Olevia Perches, MD, Glenetta Hew   . Numbness in right leg    pt relates to sciatica  . Tobacco user     Patient Active Problem List   Diagnosis Date Noted  . Degenerative disc disease at L5-S1 level 07/20/2019  . Chest pain 05/02/2017  . Hyperglycemia 05/02/2017  . CAD (coronary artery disease), native coronary artery 11/16/2015  . Precordial chest pain 11/15/2015  . Right kidney stone 08/06/2014  . Low back pain 03/01/2014  . Neck pain 03/01/2014  .  History of laparoscopic appendectomy 09/18/2013  . Chest pain with moderate risk of acute coronary syndrome 09/17/2013  . Myalgia and myositis, unspecified 05/29/2013  . Bilateral leg edema 02/21/2013  . Dental caries 01/19/2013  . Angina pectoris ([place]) 12/07/2012  . Dizzy 12/07/2012  . Hypokalemia 12/07/2012  . Sciatica of right side 11/25/2012  . Intervertebral disk disease 07/27/2012  . Obesity, Class II, BMI 35-39.9 03/18/2012  . Anxiety 03/18/2012  . HTN (hypertension) 02/09/2012  . CAD - CABG '09, OM3 stent March 2010, last cath 4/13- medical Rx 10/29/2011  . Hyperlipidemia, mixed 05/30/2008  . TOBACCO USE 05/30/2008    Past Surgical History:  Procedure Laterality Date  . APPENDECTOMY    . CARDIAC CATHETERIZATION    . CORONARY ARTERY BYPASS GRAFT     status post diaphragmatic wall infarction Rx BMS RCA 03-11-08   . CORONARY STENT PLACEMENT    . CYSTOSCOPY WITH RETROGRADE PYELOGRAM, URETEROSCOPY AND STENT PLACEMENT Bilateral 03/16/2015   Procedure: CYSTOSCOPY WITH BILATERAL RETROGRADE PYELOGRAM, RIGHT URETEROSCOPY, STONE BASKETRY WITH LASER LITHOTRIPSY ON THE RIGHT,  AND BILATERAL STENT PLACEMENT;  Surgeon: Cleon Gustin, MD;  Location: [facility] ORS;  Service: Urology;  Laterality: Bilateral;  . CYSTOSCOPY WITH RETROGRADE PYELOGRAM, URETEROSCOPY AND STENT PLACEMENT Left 04/02/2015   Procedure: CYSTOSCOPY WITH RETROGRADE PYELOGRAM, URETEROSCOPY REMOVAL BILATERAL STENTS;  Surgeon: Cleon Gustin, MD;  Location:  [facility] ORS;  Service: Urology;  Laterality: Left;  . HOLMIUM LASER APPLICATION Right 03/16/2015   Procedure: HOLMIUM LASER APPLICATION;  Surgeon: Malen Gauze, MD;  Location: [facility] ORS;  Service: Urology;  Laterality: Right;  . LAPAROSCOPIC APPENDECTOMY N/A 09/17/2013   Procedure: APPENDECTOMY LAPAROSCOPIC;  Surgeon: Shelly Rubenstein, MD;  Location: [facility] OR;  Service: General;  Laterality: N/A;  . LEFT HEART CATHETERIZATION WITH CORONARY/GRAFT ANGIOGRAM N/A 10/19/2011    Procedure: LEFT HEART CATHETERIZATION WITH Isabel Caprice;  Surgeon: Herby Abraham, MD;  Location: [facility] CATH LAB;  Service: Cardiovascular;  Laterality: N/A;  . ORTHOPEDIC SURGERY    . TONSILLECTOMY         Family History  Adopted: Yes  Problem Relation Age of Onset  . Cancer Mother   . Diabetes Mother   . Hyperlipidemia Mother   . Hypertension Mother   . Heart failure Mother        Died age 84  . Cancer Sister   . Diabetes Sister   . Hyperlipidemia Sister   . Hypertension Sister     Social History   Tobacco Use  . Smoking status: Current Every Day Smoker    Years: 30.00    Types: Cigarettes  . Smokeless tobacco: Former Neurosurgeon    Types: Snuff    Quit date: 07/20/1994  . Tobacco comment: 5-6 cigs/day  Substance Use Topics  . Alcohol use: Yes    Alcohol/week: 0.0 standard drinks    Comment: may have a drink every few months  . Drug use: No    Comment: former     Home Medications Prior to Admission medications   Medication Sig Start Date End Date Taking? Authorizing Provider  atorvastatin (LIPITOR) 80 MG tablet TAKE 1 TABLET BY MOUTH ONCE DAILY AT 6 PM 06/20/19  Yes Donita Brooks, MD  baclofen (LIORESAL) 10 MG tablet Take 1 tablet (10 mg total) by mouth 3 (three) times daily. 07/06/19  Yes Donita Brooks, MD  clopidogrel (PLAVIX) 75 MG tablet TAKE 1 TABLET BY MOUTH ONCE DAILY IN THE MORNING 06/20/19  Yes Donita Brooks, MD  DULoxetine (CYMBALTA) 60 MG capsule Take 1 capsule (60 mg total) by mouth daily. 07/20/19  Yes Hoy Register, MD  fenofibrate (TRICOR) 48 MG tablet Take 1 tablet (48 mg total) by mouth daily. 06/20/19  Yes Donita Brooks, MD  gabapentin (NEURONTIN) 300 MG capsule TAKE 1 CAPSULE BY MOUTH IN THE MORNING, TAKE 1 CAPSULE BY MOUTH IN THE EVENING AND TAKE 2 CAPSULES BY MOUTH AT BEDTIME 06/20/19  Yes Donita Brooks, MD  icosapent Ethyl (VASCEPA) 1 g capsule TAKE 2 CAPSULES BY MOUTH TWICE DAILY WITH MEALS 07/18/19  Yes Donita Brooks,  MD  isosorbide mononitrate (IMDUR) 60 MG 24 hr tablet Take 1 tablet (60 mg total) by mouth daily. 06/20/19  Yes Donita Brooks, MD  losartan (COZAAR) 100 MG tablet Take 1 tablet (100 mg total) by mouth daily. 06/20/19  Yes Donita Brooks, MD  metoprolol tartrate (LOPRESSOR) 25 MG tablet Take 1 tablet (25 mg total) by mouth 2 (two) times daily. 06/20/19  Yes Donita Brooks, MD  Multiple Vitamin (MULTI-VITAMIN DAILY PO) Take 1 Dose by mouth daily. [employer] vitamin pack takes 1 packet by mouth daily   Yes [provider]  nitroGLYCERIN (NITROSTAT) 0.4 MG SL tablet Place 1 tablet (0.4 mg total) under the tongue every 5 (five) minutes as needed for chest pain. 07/20/19  Yes Hoy Register, MD  ranolazine (  RANEXA) 500 MG 12 hr tablet Take 1 tablet (500 mg total) by mouth 2 (two) times daily. 06/20/19  Yes Donita Brooks, MD  cephALEXin (KEFLEX) 500 MG capsule Take 1 capsule (500 mg total) by mouth 4 (four) times daily. 08/07/19   Tilden Fossa, MD  clonazePAM (KLONOPIN) 1 MG tablet Take 1 tablet (1 mg total) by mouth 2 (two) times daily as needed. for anxiety Patient not taking: Reported on 08/07/2019 07/06/19   Donita Brooks, MD  omega-3 acid ethyl esters (LOVAZA) 1 g capsule Take 1 capsule (1 g total) by mouth 2 (two) times daily. Patient not taking: Reported on 08/07/2019 05/02/17   Glade Lloyd, MD  ondansetron (ZOFRAN ODT) 4 MG disintegrating tablet Take 1 tablet (4 mg total) by mouth every 8 (eight) hours as needed for nausea or vomiting. 08/07/19   Tilden Fossa, MD  oxyCODONE-acetaminophen (PERCOCET) 7.5-325 MG tablet Take 1 tablet by mouth every 4 (four) hours as needed for severe pain. Patient not taking: Reported on 08/07/2019 07/10/19   Donita Brooks, MD    Allergies    Celexa [citalopram] and Codeine  Review of Systems   Review of Systems  Genitourinary: Positive for flank pain and hematuria.  All other systems reviewed and are negative.   Physical Exam Updated  Vital Signs BP 120/80   Pulse (!) 109   Temp 99.2 F (37.3 C) (Oral)   Resp 18   SpO2 97%   Physical Exam Vitals and nursing note reviewed.  Constitutional:      Appearance: He is well-developed.  HENT:     Head: Normocephalic and atraumatic.  Cardiovascular:     Rate and Rhythm: Regular rhythm. Tachycardia present.     Heart sounds: No murmur.  Pulmonary:     Effort: Pulmonary effort is normal. No respiratory distress.     Breath sounds: Normal breath sounds.  Abdominal:     Palpations: Abdomen is soft.     Tenderness: There is no guarding or rebound.     Comments: Mild left flank and left lower quadrant tenderness  Musculoskeletal:        General: No swelling or tenderness.  Skin:    General: Skin is warm and dry.  Neurological:     Mental Status: He is alert and oriented to person, place, and time.  Psychiatric:        Behavior: Behavior normal.     ED Results / Procedures / Treatments   Labs (all labs ordered are listed, but only abnormal results are displayed) Labs Reviewed  URINALYSIS, ROUTINE W REFLEX MICROSCOPIC - Abnormal; Notable for the following components:      Result Value   Color, Urine AMBER (*)    APPearance CLOUDY (*)    Glucose, UA 50 (*)    Ketones, ur 5 (*)    Protein, ur 100 (*)    Nitrite POSITIVE (*)    Leukocytes,Ua LARGE (*)    WBC, UA >50 (*)    Bacteria, UA MANY (*)    All other components within normal limits  COMPREHENSIVE METABOLIC PANEL - Abnormal; Notable for the following components:   Glucose, Bld 118 (*)    All other components within normal limits  CBC WITH DIFFERENTIAL/PLATELET - Abnormal; Notable for the following components:   WBC 18.8 (*)    Platelets 418 (*)    Neutro Abs 15.7 (*)    Abs Immature Granulocytes 0.08 (*)    All other components within normal limits  URINE  CULTURE  CBC WITH DIFFERENTIAL/PLATELET  GC/CHLAMYDIA PROBE AMP ([place]) NOT AT [facility]    EKG None  Radiology CT Renal Stone  Study  Result Date: 08/07/2019 CLINICAL DATA:  Flank pain. EXAM: CT ABDOMEN AND PELVIS WITHOUT CONTRAST TECHNIQUE: Multidetector CT imaging of the abdomen and pelvis was performed following the standard protocol without IV contrast. COMPARISON:  March 16, 2015 FINDINGS: Lower chest: Multiple sternal wires are seen. No acute abnormality. Hepatobiliary: No focal liver abnormality is seen. No gallstones, gallbladder wall thickening, or biliary dilatation. Pancreas: Unremarkable. No pancreatic ductal dilatation or surrounding inflammatory changes. Spleen: Normal in size without focal abnormality. Adrenals/Urinary Tract: Adrenal glands are unremarkable. Kidneys are normal, without renal calculi, focal lesion, or hydronephrosis. Bladder is unremarkable. Stomach/Bowel: Stomach is within normal limits. The appendix is not identified. No evidence of bowel wall thickening, distention, or inflammatory changes. Vascular/Lymphatic: Moderate to marked severity aortic atherosclerosis. No enlarged abdominal or pelvic lymph nodes. Reproductive: Prostate is unremarkable. Other: No abdominal wall hernia or abnormality. No abdominopelvic ascites. Musculoskeletal: No acute or significant osseous findings. IMPRESSION: 1. No CT evidence of acute intra-abdominal pathology. 2. Moderate to marked severity aortic atherosclerosis. Aortic Atherosclerosis (ICD10-I70.0). Electronically Signed   By: Aram Candela M.D.   On: 08/07/2019 17:49    Procedures Procedures (including critical care time)  Medications Ordered in ED Medications  cefTRIAXone (ROCEPHIN) 1 g in sodium chloride 0.9 % 100 mL IVPB (has no administration in time range)  sodium chloride 0.9 % bolus 1,000 mL (0 mLs Intravenous Stopped 08/07/19 1917)  morphine 4 MG/ML injection 4 mg (4 mg Intravenous Given 08/07/19 1721)  morphine 4 MG/ML injection 4 mg (4 mg Intravenous Given 08/07/19 1920)    ED Course  I have reviewed the triage vital signs and the nursing  notes.  Pertinent labs & imaging results that were available during my care of the patient were reviewed by me and considered in my medical decision making (see chart for details).    MDM Rules/Calculators/A&P                     Patient here for evaluation of left flank pain, hematuria and dysuria. He is non-toxic appearing on evaluation. CT scan is negative for stone. CBC with leukocytosis. UA is consistent with UTI. He was treated with IV fluids, pain medications and antibiotics. Discussed with patient findings of UTI. Discussed home care with antibiotics, oral fluid hydration analgesics. Discussed outpatient follow-up as well as return precautions.  Presentation is not consistent with sepsis, dissection. Discussed with patient incidental finding of atherosclerosis on CT scan. Final Clinical Impression(s) / ED Diagnoses Final diagnoses:  Pyelonephritis    Rx / DC Orders ED Discharge Orders         Ordered    cephALEXin (KEFLEX) 500 MG capsule  4 times daily     08/07/19 2001    ondansetron (ZOFRAN ODT) 4 MG disintegrating tablet  Every 8 hours PRN     08/07/19 2001           Tilden Fossa, MD 08/07/19 2004

## 2019-08-07 NOTE — ED Notes (Signed)
Call to Lab to follow up on urinalysis as there is no result in chart and it appears that it was sent a few hours ago.

## 2019-08-09 LAB — URINE CULTURE: Culture: >=100000 — AB

## 2019-08-10 ENCOUNTER — Telehealth: Payer: Self-pay

## 2019-08-10 NOTE — Telephone Encounter (Signed)
Post ED Visit - Positive Culture Follow-up  Culture report reviewed by antimicrobial stewardship pharmacist: [facility] []  , Pharm.D. []  Enzo Bi, Pharm.D., BCPS AQ-ID []  , Pharm.D., BCPS []  Celedonio Miyamoto, Pharm.D., BCPS []  [place], Garvin Fila.D., BCPS, AAHIVP []  , Pharm.D., BCPS, AAHIVP []  Georgina Pillion, PharmD, BCPS []  , PharmD, BCPS []  [place], PharmD, BCPS []  [address], PharmD []  , PharmD, BCPS []  Estella Husk, PharmD  Pharmacy Team []  Lysle Pearl, PharmD []  , PharmD []  Phillips Climes, PharmD []  , Rph []  Agapito Games) , PharmD []  Verlan Friends, PharmD []  , PharmD []  Mervyn Gay, PharmD []  , PharmD []  Vinnie Level, PharmD []  [facility], PharmD []  , PharmD [x]  Len Childs, PharmD   Positive urine culture Treated with Cephalexin, organism sensitive to the same and no further patient follow-up is required at this time.  08/10/2019, 10:19 AM

## 2019-08-17 ENCOUNTER — Other Ambulatory Visit: Payer: Self-pay

## 2019-08-18 MED ORDER — BACLOFEN 10 MG PO TABS: 10.0000 mg | ORAL_TABLET | Freq: Three times a day (TID) | ORAL | 0 refills | Status: DC

## 2019-08-23 ENCOUNTER — Telehealth: Payer: Self-pay

## 2019-08-23 NOTE — Telephone Encounter (Signed)
Called patient to do their pre-visit COVID screening.  Call went to voicemail. Unable to do prescreening.  

## 2019-08-24 ENCOUNTER — Other Ambulatory Visit: Payer: Self-pay

## 2019-08-24 ENCOUNTER — Encounter: Payer: Self-pay | Admitting: Internal Medicine

## 2019-08-24 ENCOUNTER — Ambulatory Visit (INDEPENDENT_AMBULATORY_CARE_PROVIDER_SITE_OTHER): Payer: Medicare Other | Admitting: Internal Medicine

## 2019-08-24 VITALS — BP 97/66 | HR 78 | Temp 97.3°F | Resp 17 | Ht 67.0 in | Wt 251.0 lb

## 2019-08-24 DIAGNOSIS — E782 Mixed hyperlipidemia: Secondary | ICD-10-CM

## 2019-08-24 DIAGNOSIS — F17200 Nicotine dependence, unspecified, uncomplicated: Secondary | ICD-10-CM

## 2019-08-24 DIAGNOSIS — F17210 Nicotine dependence, cigarettes, uncomplicated: Secondary | ICD-10-CM

## 2019-08-24 DIAGNOSIS — I10 Essential (primary) hypertension: Secondary | ICD-10-CM | POA: Diagnosis not present

## 2019-08-24 DIAGNOSIS — M5136 Other intervertebral disc degeneration, lumbar region: Secondary | ICD-10-CM

## 2019-08-24 DIAGNOSIS — R7303 Prediabetes: Secondary | ICD-10-CM

## 2019-08-24 DIAGNOSIS — M51379 Other intervertebral disc degeneration, lumbosacral region without mention of lumbar back pain or lower extremity pain: Secondary | ICD-10-CM

## 2019-08-24 DIAGNOSIS — E7800 Pure hypercholesterolemia, unspecified: Secondary | ICD-10-CM | POA: Diagnosis not present

## 2019-08-24 DIAGNOSIS — M5137 Other intervertebral disc degeneration, lumbosacral region: Secondary | ICD-10-CM

## 2019-08-24 MED ORDER — ICOSAPENT ETHYL 1 G PO CAPS: ORAL_CAPSULE | ORAL | 0 refills | Status: DC

## 2019-08-24 NOTE — Patient Instructions (Signed)
DASH Eating Plan DASH stands for "Dietary Approaches to Stop Hypertension." The DASH eating plan is a healthy eating plan that has been shown to reduce high blood pressure (hypertension). It may also reduce your risk for type 2 diabetes, heart disease, and stroke. The DASH eating plan may also help with weight loss. What are tips for following this plan?  General guidelines  Avoid eating more than 2,300 mg (milligrams) of salt (sodium) a day. If you have hypertension, you may need to reduce your sodium intake to 1,500 mg a day.  Limit alcohol intake to no more than 1 drink a day for nonpregnant women and 2 drinks a day for men. One drink equals 12 oz of beer, 5 oz of wine, or 1 oz of hard liquor.  Work with your health care provider to maintain a healthy body weight or to lose weight. Ask what an ideal weight is for you.  Get at least 30 minutes of exercise that causes your heart to beat faster (aerobic exercise) most days of the week. Activities may include walking, swimming, or biking.  Work with your health care provider or diet and nutrition specialist (dietitian) to adjust your eating plan to your individual calorie needs. Reading food labels   Check food labels for the amount of sodium per serving. Choose foods with less than 5 percent of the Daily Value of sodium. Generally, foods with less than 300 mg of sodium per serving fit into this eating plan.  To find whole grains, look for the word "whole" as the first word in the ingredient list. Shopping  Buy products labeled as "low-sodium" or "no salt added."  Buy fresh foods. Avoid canned foods and premade or frozen meals. Cooking  Avoid adding salt when cooking. Use salt-free seasonings or herbs instead of table salt or sea salt. Check with your health care provider or pharmacist before using salt substitutes.  Do not fry foods. Cook foods using healthy methods such as baking, boiling, grilling, and broiling instead.  Cook with  heart-healthy oils, such as olive, canola, soybean, or sunflower oil. Meal planning  Eat a balanced diet that includes: ? 5 or more servings of fruits and vegetables each day. At each meal, try to fill half of your plate with fruits and vegetables. ? Up to 6-8 servings of whole grains each day. ? Less than 6 oz of lean meat, poultry, or fish each day. A 3-oz serving of meat is about the same size as a deck of cards. One egg equals 1 oz. ? 2 servings of low-fat dairy each day. ? A serving of nuts, seeds, or beans 5 times each week. ? Heart-healthy fats. Healthy fats called Omega-3 fatty acids are found in foods such as flaxseeds and coldwater fish, like sardines, salmon, and mackerel.  Limit how much you eat of the following: ? Canned or prepackaged foods. ? Food that is high in trans fat, such as fried foods. ? Food that is high in saturated fat, such as fatty meat. ? Sweets, desserts, sugary drinks, and other foods with added sugar. ? Full-fat dairy products.  Do not salt foods before eating.  Try to eat at least 2 vegetarian meals each week.  Eat more home-cooked food and less restaurant, buffet, and fast food.  When eating at a restaurant, ask that your food be prepared with less salt or no salt, if possible. What foods are recommended? The items listed may not be a complete list. Talk with your dietitian about   what dietary choices are best for you. Grains Whole-grain or whole-wheat bread. Whole-grain or whole-wheat pasta. Brown rice. Oatmeal. Quinoa. Bulgur. Whole-grain and low-sodium cereals. Pita bread. Low-fat, low-sodium crackers. Whole-wheat flour tortillas. Vegetables Fresh or frozen vegetables (raw, steamed, roasted, or grilled). Low-sodium or reduced-sodium tomato and vegetable juice. Low-sodium or reduced-sodium tomato sauce and tomato paste. Low-sodium or reduced-sodium canned vegetables. Fruits All fresh, dried, or frozen fruit. Canned fruit in natural juice (without  added sugar). Meat and other protein foods Skinless chicken or turkey. Ground chicken or turkey. Pork with fat trimmed off. Fish and seafood. Egg whites. Dried beans, peas, or lentils. Unsalted nuts, nut butters, and seeds. Unsalted canned beans. Lean cuts of beef with fat trimmed off. Low-sodium, lean deli meat. Dairy Low-fat (1%) or fat-free (skim) milk. Fat-free, low-fat, or reduced-fat cheeses. Nonfat, low-sodium ricotta or cottage cheese. Low-fat or nonfat yogurt. Low-fat, low-sodium cheese. Fats and oils Soft margarine without trans fats. Vegetable oil. Low-fat, reduced-fat, or light mayonnaise and salad dressings (reduced-sodium). Canola, safflower, olive, soybean, and sunflower oils. Avocado. Seasoning and other foods Herbs. Spices. Seasoning mixes without salt. Unsalted popcorn and pretzels. Fat-free sweets. What foods are not recommended? The items listed may not be a complete list. Talk with your dietitian about what dietary choices are best for you. Grains Baked goods made with fat, such as croissants, muffins, or some breads. Dry pasta or rice meal packs. Vegetables Creamed or fried vegetables. Vegetables in a cheese sauce. Regular canned vegetables (not low-sodium or reduced-sodium). Regular canned tomato sauce and paste (not low-sodium or reduced-sodium). Regular tomato and vegetable juice (not low-sodium or reduced-sodium). Pickles. Olives. Fruits Canned fruit in a light or heavy syrup. Fried fruit. Fruit in cream or butter sauce. Meat and other protein foods Fatty cuts of meat. Ribs. Fried meat. Bacon. Sausage. Bologna and other processed lunch meats. Salami. Fatback. Hotdogs. Bratwurst. Salted nuts and seeds. Canned beans with added salt. Canned or smoked fish. Whole eggs or egg yolks. Chicken or turkey with skin. Dairy Whole or 2% milk, cream, and half-and-half. Whole or full-fat cream cheese. Whole-fat or sweetened yogurt. Full-fat cheese. Nondairy creamers. Whipped toppings.  Processed cheese and cheese spreads. Fats and oils Butter. Stick margarine. Lard. Shortening. Ghee. Bacon fat. Tropical oils, such as coconut, palm kernel, or palm oil. Seasoning and other foods Salted popcorn and pretzels. Onion salt, garlic salt, seasoned salt, table salt, and sea salt. Worcestershire sauce. Tartar sauce. Barbecue sauce. Teriyaki sauce. Soy sauce, including reduced-sodium. Steak sauce. Canned and packaged gravies. Fish sauce. Oyster sauce. Cocktail sauce. Horseradish that you find on the shelf. Ketchup. Mustard. Meat flavorings and tenderizers. Bouillon cubes. Hot sauce and Tabasco sauce. Premade or packaged marinades. Premade or packaged taco seasonings. Relishes. Regular salad dressings. Where to find more information:  National Heart, Lung, and Blood Institute: www.nhlbi.nih.gov  American Heart Association: www.heart.org Summary  The DASH eating plan is a healthy eating plan that has been shown to reduce high blood pressure (hypertension). It may also reduce your risk for type 2 diabetes, heart disease, and stroke.  With the DASH eating plan, you should limit salt (sodium) intake to 2,300 mg a day. If you have hypertension, you may need to reduce your sodium intake to 1,500 mg a day.  When on the DASH eating plan, aim to eat more fresh fruits and vegetables, whole grains, lean proteins, low-fat dairy, and heart-healthy fats.  Work with your health care provider or diet and nutrition specialist (dietitian) to adjust your eating plan to your   individual calorie needs. This information is not intended to replace advice given to you by your health care provider. Make sure you discuss any questions you have with your health care provider. Document Revised: 06/18/2017 Document Reviewed: 06/29/2016 Elsevier Patient Education  2020 Elsevier Inc.  

## 2019-08-24 NOTE — Progress Notes (Signed)
  Subjective:    [name] - 44 y.o. male MRN [id number]  Date of birth: Aug 02, 1975  HPI  [name] is here for f/u chronic medical conditions.  HLD: Takes Atorvastatin 80 mg, Tricor 48 mg, and Vascepa 2g BID. Reports compliance with medications. No myalgias or RUQ pain.   Chronic HTN Disease Monitoring:  Home BP Monitoring - None  Chest pain- no  Dyspnea- no Headache - no  Medications: Imdur 60 mg, Losartan 100 mg, Metoprolol 25 mg BID  Compliance- no Lightheadedness- no  Edema- no    Health Maintenance:  There are no preventive care reminders to display for this patient.  -  reports that he has been smoking cigarettes. He has smoked for the past 30.00 years. He quit smokeless tobacco use about 25 years ago.  His smokeless tobacco use included snuff. - Review of Systems: Per HPI. - Past Medical History: Patient Active Problem List   Diagnosis Date Noted  . Degenerative disc disease at L5-S1 level 07/20/2019  . Chest pain with moderate risk of acute coronary syndrome 09/17/2013  . Dental caries 01/19/2013  . Angina pectoris (HCC) 12/07/2012  . Sciatica of right side 11/25/2012  . Obesity, Class II, BMI 35-39.9 03/18/2012  . Anxiety 03/18/2012  . HTN (hypertension) 02/09/2012  . CAD - CABG '09, OM3 stent March 2010, last cath 4/13- medical Rx 10/29/2011  . Hyperlipidemia, mixed 05/30/2008  . TOBACCO USE 05/30/2008   - Medications: reviewed and updated   Objective:   Physical Exam BP 97/66   Pulse 78   Temp (!) 97.3 F (36.3 C) (Temporal)   Resp 17   Ht 5\' 7"  (1.702 m)   Wt 251 lb (113.9 kg)   SpO2 95%   BMI 39.31 kg/m  Physical Exam  Constitutional: He is oriented to person, place, and time and well-developed, well-nourished, and in no distress. No distress.  HENT:  Head: Normocephalic and atraumatic.  Eyes: Conjunctivae and EOM are normal.  Cardiovascular: Normal rate, regular rhythm and normal heart sounds.  No murmur  heard. Pulmonary/Chest: Effort normal and breath sounds normal. No respiratory distress.  Musculoskeletal:        General: Normal range of motion.  Neurological: He is alert and oriented to person, place, and time.  Skin: Skin is warm and dry. He is not diaphoretic.  Psychiatric: Affect and judgment normal.      Assessment & Plan:    1. Prediabetes A1c 6.0% Jan 2020. Counseled on carb modified diet and exercise. Counseled on progression to DM has potential to have multi organ complications.  - Hemoglobin A1c  2. Essential hypertension BP slightly soft today but patient reports being asymptomatic. Continue current regimen. Recent BMET 08/07/19 without abnormalities.    3. Hyperlipidemia, mixed - icosapent Ethyl (VASCEPA) 1 g capsule; TAKE 2 CAPSULES BY MOUTH TWICE DAILY WITH MEALS  Dispense: 120 capsule; Refill: 0 - Lipid panel  4. TOBACCO USE Patient currently smokes 10 cigarettes per day down from 1.5  ppd. Spent 3 minutes counseling on dangers of tobacco use and benefits of quitting, offered pharmacological intervention to aid quitting and patient is trying to quit by slowly weaning.   5. Degenerative disc disease at L5-S1 level Referral for pain management physician is pending.      08/09/19, D.O. 08/24/2019, 9:21 AM Primary Care at [facility]

## 2019-08-25 LAB — LIPID PANEL
Chol/HDL Ratio: 3.6 ratio (ref 0.0–5.0)
Cholesterol, Total: 130 mg/dL (ref 100–199)
HDL: 36 mg/dL — ABNORMAL LOW (ref >39)
LDL Chol Calc (NIH): 62 mg/dL (ref 0–99)
Triglycerides: 190 mg/dL — ABNORMAL HIGH (ref 0–149)
VLDL Cholesterol Cal: 32 mg/dL (ref 5–40)

## 2019-08-25 LAB — HEMOGLOBIN A1C
Est. average glucose Bld gHb Est-mCnc: 128 mg/dL
Hgb A1c MFr Bld: 6.1 % — ABNORMAL HIGH (ref 4.8–5.6)

## 2019-09-21 DIAGNOSIS — G894 Chronic pain syndrome: Secondary | ICD-10-CM | POA: Diagnosis not present

## 2019-09-21 DIAGNOSIS — F17210 Nicotine dependence, cigarettes, uncomplicated: Secondary | ICD-10-CM | POA: Diagnosis not present

## 2019-09-21 DIAGNOSIS — Z79899 Other long term (current) drug therapy: Secondary | ICD-10-CM | POA: Diagnosis not present

## 2019-09-25 ENCOUNTER — Other Ambulatory Visit: Payer: Self-pay

## 2019-09-25 DIAGNOSIS — E782 Mixed hyperlipidemia: Secondary | ICD-10-CM

## 2019-09-25 MED ORDER — ICOSAPENT ETHYL 1 G PO CAPS: ORAL_CAPSULE | ORAL | 2 refills | Status: DC

## 2019-10-23 DIAGNOSIS — Z79899 Other long term (current) drug therapy: Secondary | ICD-10-CM | POA: Diagnosis not present

## 2019-10-23 DIAGNOSIS — F17210 Nicotine dependence, cigarettes, uncomplicated: Secondary | ICD-10-CM | POA: Diagnosis not present

## 2019-10-23 DIAGNOSIS — R209 Unspecified disturbances of skin sensation: Secondary | ICD-10-CM | POA: Diagnosis not present

## 2019-10-23 DIAGNOSIS — M79605 Pain in left leg: Secondary | ICD-10-CM | POA: Diagnosis not present

## 2019-10-23 DIAGNOSIS — E559 Vitamin D deficiency, unspecified: Secondary | ICD-10-CM | POA: Diagnosis not present

## 2019-10-23 DIAGNOSIS — M79604 Pain in right leg: Secondary | ICD-10-CM | POA: Diagnosis not present

## 2019-10-23 DIAGNOSIS — Z1159 Encounter for screening for other viral diseases: Secondary | ICD-10-CM | POA: Diagnosis not present

## 2019-10-23 DIAGNOSIS — G894 Chronic pain syndrome: Secondary | ICD-10-CM | POA: Diagnosis not present

## 2019-10-27 DIAGNOSIS — M79604 Pain in right leg: Secondary | ICD-10-CM | POA: Diagnosis not present

## 2019-10-27 DIAGNOSIS — M79605 Pain in left leg: Secondary | ICD-10-CM | POA: Diagnosis not present

## 2019-10-27 DIAGNOSIS — R209 Unspecified disturbances of skin sensation: Secondary | ICD-10-CM | POA: Diagnosis not present

## 2019-11-03 ENCOUNTER — Other Ambulatory Visit: Payer: Self-pay

## 2019-11-03 ENCOUNTER — Encounter (HOSPITAL_COMMUNITY): Payer: Self-pay

## 2019-11-03 ENCOUNTER — Emergency Department (HOSPITAL_COMMUNITY)
Admission: EM | Admit: 2019-11-03 | Discharge: 2019-11-03 | Disposition: A | Discharge status: Home / Self Care | Payer: Medicare Other | Attending: Emergency Medicine | Admitting: Emergency Medicine

## 2019-11-03 DIAGNOSIS — I252 Old myocardial infarction: Secondary | ICD-10-CM | POA: Diagnosis not present

## 2019-11-03 DIAGNOSIS — Z951 Presence of aortocoronary bypass graft: Secondary | ICD-10-CM | POA: Insufficient documentation

## 2019-11-03 DIAGNOSIS — R21 Rash and other nonspecific skin eruption: Secondary | ICD-10-CM | POA: Diagnosis present

## 2019-11-03 DIAGNOSIS — B354 Tinea corporis: Secondary | ICD-10-CM | POA: Insufficient documentation

## 2019-11-03 DIAGNOSIS — I509 Heart failure, unspecified: Secondary | ICD-10-CM | POA: Insufficient documentation

## 2019-11-03 DIAGNOSIS — I2510 Atherosclerotic heart disease of native coronary artery without angina pectoris: Secondary | ICD-10-CM | POA: Diagnosis not present

## 2019-11-03 DIAGNOSIS — I110 Hypertensive heart disease with heart failure: Secondary | ICD-10-CM | POA: Insufficient documentation

## 2019-11-03 DIAGNOSIS — F17210 Nicotine dependence, cigarettes, uncomplicated: Secondary | ICD-10-CM | POA: Diagnosis not present

## 2019-11-03 DIAGNOSIS — Z79899 Other long term (current) drug therapy: Secondary | ICD-10-CM | POA: Insufficient documentation

## 2019-11-03 DIAGNOSIS — Z7902 Long term (current) use of antithrombotics/antiplatelets: Secondary | ICD-10-CM | POA: Insufficient documentation

## 2019-11-03 MED ORDER — CLOTRIMAZOLE 1 % EX CREA: TOPICAL_CREAM | CUTANEOUS | 0 refills | Status: DC

## 2019-11-03 NOTE — ED Triage Notes (Signed)
Patient states he has been having bilateral shin pain and swelling x 2 weeks. Patient states worse today. Patient does have redness to the shins and weeping.

## 2019-11-03 NOTE — Discharge Instructions (Signed)
Use Clotrimazole cream twice a day for 2 weeks on the legs and arm If rash is not improving after 2 weeks please make a follow up appointment with your doctor

## 2019-11-03 NOTE — ED Provider Notes (Signed)
[facility]-EMERGENCY DEPT Provider Note   CSN: [id number] Arrival date & time: 11/03/19  1345     History Chief Complaint  Patient presents with  . Leg Pain  . Leg Swelling    [name] is a 44 y.o. male with multiple medical problems who presents with a rash and bilateral leg pain.  Patient states that about 2 weeks ago he noticed that his shins were hurting.  He developed redness to the shins bilaterally.  The rash is burning and itchy especially when he takes a hot shower.  He has been applying cocoa butter without relief.  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  He started to develop a rash on his left upper arm as well that is similar.  He denies fever, chills.  He started to develop bilateral leg swelling and feels pressure of the legs so decided to come to the ED. No chest pain or SOB.  HPI     Past Medical History:  Diagnosis Date  . Anxiety   . Arthritis   . CHF (congestive heart failure) (HCC)   . Complication of anesthesia    difficulty awakening following appendectomy   . Dizziness    with humidity   . History of laparoscopic appendectomy 09/18/2013  . Hx of CABG   . Hyperlipidemia   . Hypertension   . Kidney stones   . MYOCARDIAL INFARCTION 10/13/2008   Qualifier: Diagnosis of  By: Juanda Chance, MD, Johny Chess   . Numbness in right leg    pt relates to sciatica  . Right kidney stone 08/06/2014   Noted incidentally on CT January 2016; ~0.6 cm, non-obstructing   . Tobacco user     Patient Active Problem List   Diagnosis Date Noted  . Prediabetes 08/24/2019  . Degenerative disc disease at L5-S1 level 07/20/2019  . Chest pain with moderate risk of acute coronary syndrome 09/17/2013  . Dental caries 01/19/2013  . Angina pectoris (HCC) 12/07/2012  . Sciatica of right side 11/25/2012  . Obesity, Class II, BMI 35-39.9 03/18/2012  . Anxiety 03/18/2012  . HTN (hypertension) 02/09/2012  . CAD -  CABG '09, OM3 stent March 2010, last cath 4/13- medical Rx 10/29/2011  . Hyperlipidemia, mixed 05/30/2008  . TOBACCO USE 05/30/2008    Past Surgical History:  Procedure Laterality Date  . APPENDECTOMY    . CARDIAC CATHETERIZATION    . CORONARY ARTERY BYPASS GRAFT     status post diaphragmatic wall infarction Rx BMS RCA 03-11-08   . CORONARY STENT PLACEMENT    . CYSTOSCOPY WITH RETROGRADE PYELOGRAM, URETEROSCOPY AND STENT PLACEMENT Bilateral 03/16/2015   Procedure: CYSTOSCOPY WITH BILATERAL RETROGRADE PYELOGRAM, RIGHT URETEROSCOPY, STONE BASKETRY WITH LASER LITHOTRIPSY ON THE RIGHT,  AND BILATERAL STENT PLACEMENT;  Surgeon: Malen Gauze, MD;  Location: [facility];  Service: Urology;  Laterality: Bilateral;  . CYSTOSCOPY WITH RETROGRADE PYELOGRAM, URETEROSCOPY AND STENT PLACEMENT Left 04/02/2015   Procedure: CYSTOSCOPY WITH RETROGRADE PYELOGRAM, URETEROSCOPY REMOVAL BILATERAL STENTS;  Surgeon: Malen Gauze, MD;  Location: [facility];  Service: Urology;  Laterality: Left;  . HOLMIUM LASER APPLICATION Right 03/16/2015   Procedure: HOLMIUM LASER APPLICATION;  Surgeon: Malen Gauze, MD;  Location: [facility];  Service: Urology;  Laterality: Right;  . LAPAROSCOPIC APPENDECTOMY N/A 09/17/2013   Procedure: APPENDECTOMY LAPAROSCOPIC;  Surgeon: Shelly Rubenstein, MD;  Location: [facility] OR;  Service: General;  Laterality: N/A;  . LEFT HEART CATHETERIZATION  WITH CORONARY/GRAFT ANGIOGRAM N/A 10/19/2011   Procedure: LEFT HEART CATHETERIZATION WITH Beatrix Fetters;  Surgeon: Hillary Bow, MD;  Location: [facility] CATH LAB;  Service: Cardiovascular;  Laterality: N/A;  . ORTHOPEDIC SURGERY    . TONSILLECTOMY         Family History  Adopted: Yes  Problem Relation Age of Onset  . Cancer Mother   . Diabetes Mother   . Hyperlipidemia Mother   . Hypertension Mother   . Heart failure Mother        Died age 65  . Cancer Sister   . Diabetes Sister   . Hyperlipidemia Sister   . Hypertension Sister      Social History   Tobacco Use  . Smoking status: Current Every Day Smoker    Packs/day: 0.50    Years: 30.00    Pack years: 15.00    Types: Cigarettes  . Smokeless tobacco: Former Systems developer    Types: Snuff    Quit date: 07/20/1994  . Tobacco comment: 5-6 cigs/day  Substance Use Topics  . Alcohol use: Not Currently    Alcohol/week: 0.0 standard drinks  . Drug use: No    Home Medications Prior to Admission medications   Medication Sig Start Date End Date Taking? Authorizing Provider  atorvastatin (LIPITOR) 80 MG tablet TAKE 1 TABLET BY MOUTH ONCE DAILY AT 6 PM 06/20/19   Susy Frizzle, MD  baclofen (LIORESAL) 10 MG tablet Take 1 tablet (10 mg total) by mouth 3 (three) times daily. 08/18/19   Charlott Rakes, MD  clopidogrel (PLAVIX) 75 MG tablet TAKE 1 TABLET BY MOUTH ONCE DAILY IN THE MORNING 06/20/19   Susy Frizzle, MD  DULoxetine (CYMBALTA) 60 MG capsule Take 1 capsule (60 mg total) by mouth daily. 07/20/19   Charlott Rakes, MD  fenofibrate (TRICOR) 48 MG tablet Take 1 tablet (48 mg total) by mouth daily. 06/20/19   Susy Frizzle, MD  gabapentin (NEURONTIN) 300 MG capsule TAKE 1 CAPSULE BY MOUTH IN THE MORNING, TAKE 1 CAPSULE BY MOUTH IN THE EVENING AND TAKE 2 CAPSULES BY MOUTH AT BEDTIME 06/20/19   Susy Frizzle, MD  icosapent Ethyl (VASCEPA) 1 g capsule TAKE 2 CAPSULES BY MOUTH TWICE DAILY WITH MEALS 09/25/19   Nicolette Bang, DO  isosorbide mononitrate (IMDUR) 60 MG 24 hr tablet Take 1 tablet (60 mg total) by mouth daily. 06/20/19   Susy Frizzle, MD  losartan (COZAAR) 100 MG tablet Take 1 tablet (100 mg total) by mouth daily. 06/20/19   Susy Frizzle, MD  metoprolol tartrate (LOPRESSOR) 25 MG tablet Take 1 tablet (25 mg total) by mouth 2 (two) times daily. 06/20/19   Susy Frizzle, MD  Multiple Vitamin (MULTI-VITAMIN DAILY PO) Take 1 Dose by mouth daily. [employer] vitamin pack takes 1 packet by mouth daily    [provider]  nitroGLYCERIN  (NITROSTAT) 0.4 MG SL tablet Place 1 tablet (0.4 mg total) under the tongue every 5 (five) minutes as needed for chest pain. Patient not taking: Reported on 08/24/2019 07/20/19   Charlott Rakes, MD  oxyCODONE-acetaminophen (PERCOCET) 7.5-325 MG tablet Take 1 tablet by mouth every 4 (four) hours as needed for severe pain. Patient not taking: Reported on 08/07/2019 07/10/19   Susy Frizzle, MD  ranolazine (RANEXA) 500 MG 12 hr tablet Take 1 tablet (500 mg total) by mouth 2 (two) times daily. 06/20/19   Susy Frizzle, MD    Allergies    Celexa [citalopram] and Codeine  Review of Systems   Review of Systems  Constitutional: Negative for fever.  Respiratory: Negative for shortness of breath.   Cardiovascular: Positive for leg swelling. Negative for chest pain.  Skin: Positive for rash. Negative for wound.    Physical Exam Updated Vital Signs BP (!) 131/91 (BP Location: Right Arm)   Pulse 91   Temp 98 F (36.7 C) (Oral)   Resp 18   Ht 5\' 10"  (1.778 m)   Wt 99.8 kg   SpO2 96%   BMI 31.57 kg/m   Physical Exam Vitals and nursing note reviewed.  Constitutional:      General: He is not in acute distress.    Appearance: Normal appearance. He is well-developed. He is not ill-appearing.  HENT:     Head: Normocephalic and atraumatic.  Eyes:     General: No scleral icterus.       Right eye: No discharge.        Left eye: No discharge.     Conjunctiva/sclera: Conjunctivae normal.     Pupils: Pupils are equal, round, and reactive to light.  Cardiovascular:     Rate and Rhythm: Normal rate.  Pulmonary:     Effort: Pulmonary effort is normal. No respiratory distress.  Abdominal:     General: There is no distension.  Musculoskeletal:     Cervical back: Normal range of motion.  Skin:    General: Skin is warm and dry.     Findings: Rash (erythema and scaling rash of the bilateral lower shins with irregular borders. additionally there is an erythematous scaling rash on the left  upper arm) present.  Neurological:     Mental Status: He is alert and oriented to person, place, and time.  Psychiatric:        Behavior: Behavior normal.         ED Results / Procedures / Treatments   Labs (all labs ordered are listed, but only abnormal results are displayed) Labs Reviewed - No data to display  EKG None  Radiology No results found.  Procedures Procedures (including critical care time)  Medications Ordered in ED Medications - No data to display  ED Course  I have reviewed the triage vital signs and the nursing notes.  Pertinent labs & imaging results that were available during my care of the patient were reviewed by me and considered in my medical decision making (see chart for details).  44 year old male presents with a itching and burning rash on the bilateral shins and left upper arm that started a couple weeks ago.  He has been doing a lot of landscaping and working outside.  Suspect a fungal infection.  Less likely cellulitis since he has a rash in multiple areas.  Low suspicion for CHF exacerbation with reassuring vital signs and no chest pain or shortness of breath.  Will prescribe clotrimazole and advised follow-up with PCP if symptoms are not improving.   MDM Rules/Calculators/A&P                       Final Clinical Impression(s) / ED Diagnoses Final diagnoses:  Tinea corporis    Rx / DC Orders ED Discharge Orders    None       55, PA-C 11/03/19 1443    11/05/19, DO 11/03/19 1444

## 2019-11-07 DIAGNOSIS — B354 Tinea corporis: Secondary | ICD-10-CM | POA: Diagnosis not present

## 2019-11-10 ENCOUNTER — Other Ambulatory Visit: Payer: Self-pay | Admitting: Family Medicine

## 2019-11-22 DIAGNOSIS — Z79899 Other long term (current) drug therapy: Secondary | ICD-10-CM | POA: Diagnosis not present

## 2019-11-22 DIAGNOSIS — F17210 Nicotine dependence, cigarettes, uncomplicated: Secondary | ICD-10-CM | POA: Diagnosis not present

## 2019-11-22 DIAGNOSIS — G894 Chronic pain syndrome: Secondary | ICD-10-CM | POA: Diagnosis not present

## 2019-12-12 ENCOUNTER — Other Ambulatory Visit: Payer: Self-pay

## 2019-12-12 DIAGNOSIS — E782 Mixed hyperlipidemia: Secondary | ICD-10-CM

## 2019-12-12 MED ORDER — ICOSAPENT ETHYL 1 G PO CAPS: ORAL_CAPSULE | ORAL | 1 refills | Status: DC

## 2019-12-22 DIAGNOSIS — Z79899 Other long term (current) drug therapy: Secondary | ICD-10-CM | POA: Diagnosis not present

## 2019-12-22 DIAGNOSIS — Z1329 Encounter for screening for other suspected endocrine disorder: Secondary | ICD-10-CM | POA: Diagnosis not present

## 2019-12-22 DIAGNOSIS — F17210 Nicotine dependence, cigarettes, uncomplicated: Secondary | ICD-10-CM | POA: Diagnosis not present

## 2019-12-22 DIAGNOSIS — I10 Essential (primary) hypertension: Secondary | ICD-10-CM | POA: Diagnosis not present

## 2019-12-22 DIAGNOSIS — R0602 Shortness of breath: Secondary | ICD-10-CM | POA: Diagnosis not present

## 2019-12-22 DIAGNOSIS — E559 Vitamin D deficiency, unspecified: Secondary | ICD-10-CM | POA: Diagnosis not present

## 2019-12-22 DIAGNOSIS — Z0000 Encounter for general adult medical examination without abnormal findings: Secondary | ICD-10-CM | POA: Diagnosis not present

## 2019-12-22 DIAGNOSIS — Z113 Encounter for screening for infections with a predominantly sexual mode of transmission: Secondary | ICD-10-CM | POA: Diagnosis not present

## 2019-12-22 DIAGNOSIS — Z131 Encounter for screening for diabetes mellitus: Secondary | ICD-10-CM | POA: Diagnosis not present

## 2019-12-22 DIAGNOSIS — Z1159 Encounter for screening for other viral diseases: Secondary | ICD-10-CM | POA: Diagnosis not present

## 2019-12-22 DIAGNOSIS — Z114 Encounter for screening for human immunodeficiency virus [HIV]: Secondary | ICD-10-CM | POA: Diagnosis not present

## 2019-12-22 DIAGNOSIS — G894 Chronic pain syndrome: Secondary | ICD-10-CM | POA: Diagnosis not present

## 2020-02-06 ENCOUNTER — Other Ambulatory Visit: Payer: Self-pay

## 2020-02-06 ENCOUNTER — Emergency Department (HOSPITAL_COMMUNITY)
Admission: EM | Admit: 2020-02-06 | Discharge: 2020-02-06 | Disposition: A | Discharge status: Home / Self Care | Payer: Medicare Other | Attending: Emergency Medicine | Admitting: Emergency Medicine

## 2020-02-06 ENCOUNTER — Encounter (HOSPITAL_COMMUNITY): Payer: Self-pay

## 2020-02-06 DIAGNOSIS — B379 Candidiasis, unspecified: Secondary | ICD-10-CM | POA: Diagnosis not present

## 2020-02-06 DIAGNOSIS — A630 Anogenital (venereal) warts: Secondary | ICD-10-CM | POA: Diagnosis not present

## 2020-02-06 DIAGNOSIS — I509 Heart failure, unspecified: Secondary | ICD-10-CM | POA: Diagnosis not present

## 2020-02-06 DIAGNOSIS — R21 Rash and other nonspecific skin eruption: Secondary | ICD-10-CM | POA: Diagnosis present

## 2020-02-06 DIAGNOSIS — F17210 Nicotine dependence, cigarettes, uncomplicated: Secondary | ICD-10-CM | POA: Diagnosis not present

## 2020-02-06 DIAGNOSIS — I10 Essential (primary) hypertension: Secondary | ICD-10-CM | POA: Insufficient documentation

## 2020-02-06 DIAGNOSIS — Z79899 Other long term (current) drug therapy: Secondary | ICD-10-CM | POA: Insufficient documentation

## 2020-02-06 DIAGNOSIS — L290 Pruritus ani: Secondary | ICD-10-CM | POA: Diagnosis not present

## 2020-02-06 DIAGNOSIS — I110 Hypertensive heart disease with heart failure: Secondary | ICD-10-CM | POA: Diagnosis not present

## 2020-02-06 DIAGNOSIS — I25119 Atherosclerotic heart disease of native coronary artery with unspecified angina pectoris: Secondary | ICD-10-CM | POA: Insufficient documentation

## 2020-02-06 MED ORDER — NYSTATIN 100000 UNIT/GM EX CREA: TOPICAL_CREAM | CUTANEOUS | 0 refills | Status: DC

## 2020-02-06 NOTE — ED Notes (Signed)
Patient verbalizes understanding of discharge instructions. Opportunity for questioning and answers were provided. Pt discharged from ED. 

## 2020-02-06 NOTE — Discharge Instructions (Signed)
At this time there does not appear to be the presence of an emergent medical condition, however there is always the potential for conditions to change. Please read and follow the below instructions.  Please return to the Emergency Department immediately for any new or worsening symptoms. Please be sure to follow up with your Primary Care Provider within one week regarding your visit today; please call their office to schedule an appointment even if you are feeling better for a follow-up visit. Use the nystatin cream prescribed to you today to help with yeast infection.  Please see your primary care doctor next week for follow-up appointment and recheck of your rash.  You may discuss treatment of warts with your primary care doctor at your follow-up visit.  You have also been given a referral to [facility] surgery for possible management of your warts if your primary care provider is unable to treat the warts.  Get help right away if and return to the emergency department if: Your itching gets worse. You have a fever. You have redness, swelling, or pain in the itchy area. You have fluid, blood, or pus coming from the itchy area. You have redness, swelling, or pain in the area of the treated skin. You feel sick. You feel lumps in the area around your genitals or anus. You have bleeding in the area around your genitals or anus. You have any new/concerning or worsening of symptoms  Please read the additional information packets attached to your discharge summary.  Do not take your medicine if  develop an itchy rash, swelling in your mouth or lips, or difficulty breathing; call 911 and seek immediate emergency medical attention if this occurs.  You may review your lab tests and imaging results in their entirety on your MyChart account.  Please discuss all results of fully with your primary care provider and other specialist at your follow-up visit.  Note: Portions of this text may have been  transcribed using voice recognition software. Every effort was made to ensure accuracy; however, inadvertent computerized transcription errors may still be present.

## 2020-02-06 NOTE — ED Provider Notes (Signed)
[name] Provider Note   CSN: [id number] Arrival date & time: 02/06/20  8527     History No chief complaint on file.   [name] is a 44 y.o. male history of CHF, CAD, hypertension, hyperlipidemia, kidney stones, tobacco use, sciatica.  Patient presents today for concern of a rash between his buttocks.  He reports a burning itching sensation near his rectum in his gluteal fold for the past 3 weeks reports sensation is constant mild intensity nonradiating no clear aggravating or alleviating factors he has been using Lotrimin OTC without relief of symptoms.  He reports that he had this problem several years ago and was prescribed an antifungal with improvement of his symptoms.  Denies fever/chills, abdominal pain, nausea/vomiting, diarrhea, hematochezia, melena, testicular pain/swelling or any additional concerns.  HPI     Past Medical History:  Diagnosis Date  . Anxiety   . Arthritis   . CHF (congestive heart failure) (HCC)   . Complication of anesthesia    difficulty awakening following appendectomy   . Dizziness    with humidity   . History of laparoscopic appendectomy 09/18/2013  . Hx of CABG   . Hyperlipidemia   . Hypertension   . Kidney stones   . MYOCARDIAL INFARCTION 10/13/2008   Qualifier: Diagnosis of  By: David Chance, MD, [name]   . Numbness in right leg    pt relates to sciatica  . Right kidney stone 08/06/2014   Noted incidentally on CT January 2016; ~0.6 cm, non-obstructing   . Tobacco user     Patient Active Problem List   Diagnosis Date Noted  . Prediabetes 08/24/2019  . Degenerative disc disease at L5-S1 level 07/20/2019  . Chest pain with moderate risk of acute coronary syndrome 09/17/2013  . Dental caries 01/19/2013  . Angina pectoris (HCC) 12/07/2012  . Sciatica of right side 11/25/2012  . Obesity, Class II, BMI 35-39.9 03/18/2012  . Anxiety 03/18/2012  . HTN (hypertension) 02/09/2012  . CAD  - CABG '09, OM3 stent March 2010, last cath 4/13- medical Rx 10/29/2011  . Hyperlipidemia, mixed 05/30/2008  . TOBACCO USE 05/30/2008    Past Surgical History:  Procedure Laterality Date  . APPENDECTOMY    . CARDIAC CATHETERIZATION    . CORONARY ARTERY BYPASS GRAFT     status post diaphragmatic wall infarction Rx BMS RCA 03-11-08   . CORONARY STENT PLACEMENT    . CYSTOSCOPY WITH RETROGRADE PYELOGRAM, URETEROSCOPY AND STENT PLACEMENT Bilateral 03/16/2015   Procedure: CYSTOSCOPY WITH BILATERAL RETROGRADE PYELOGRAM, RIGHT URETEROSCOPY, STONE BASKETRY WITH LASER LITHOTRIPSY ON THE RIGHT,  AND BILATERAL STENT PLACEMENT;  Surgeon: David Gauze, MD;  Location: [facility];  Service: Urology;  Laterality: Bilateral;  . CYSTOSCOPY WITH RETROGRADE PYELOGRAM, URETEROSCOPY AND STENT PLACEMENT Left 04/02/2015   Procedure: CYSTOSCOPY WITH RETROGRADE PYELOGRAM, URETEROSCOPY REMOVAL BILATERAL STENTS;  Surgeon: David Gauze, MD;  Location: [facility];  Service: Urology;  Laterality: Left;  . HOLMIUM LASER APPLICATION Right 03/16/2015   Procedure: HOLMIUM LASER APPLICATION;  Surgeon: David Gauze, MD;  Location: [facility];  Service: Urology;  Laterality: Right;  . LAPAROSCOPIC APPENDECTOMY N/A 09/17/2013   Procedure: APPENDECTOMY LAPAROSCOPIC;  Surgeon: David Rubenstein, MD;  Location: [facility];  Service: General;  Laterality: N/A;  . LEFT HEART CATHETERIZATION WITH CORONARY/GRAFT ANGIOGRAM N/A 10/19/2011   Procedure: LEFT HEART CATHETERIZATION WITH [name];  Surgeon: David Abraham, MD;  Location: [facility] CATH [facility];  Service: Cardiovascular;  Laterality: N/A;  .  ORTHOPEDIC SURGERY    . TONSILLECTOMY         Family History  Adopted: Yes  Problem Relation Age of Onset  . Cancer Mother   . Diabetes Mother   . Hyperlipidemia Mother   . Hypertension Mother   . Heart failure Mother        Died age 31  . Cancer Sister   . Diabetes Sister   . Hyperlipidemia Sister   . Hypertension Sister      Social History   Tobacco Use  . Smoking status: Current Every Day Smoker    Packs/day: 0.50    Years: 30.00    Pack years: 15.00    Types: Cigarettes  . Smokeless tobacco: Former Neurosurgeon    Types: Snuff    Quit date: 07/20/1994  . Tobacco comment: 5-6 cigs/day  Vaping Use  . Vaping Use: Never used  Substance Use Topics  . Alcohol use: Not Currently    Alcohol/week: 0.0 standard drinks  . Drug use: No    Home Medications Prior to Admission medications   Medication Sig Start Date End Date Taking? Authorizing Provider  atorvastatin (LIPITOR) 80 MG tablet TAKE 1 TABLET BY MOUTH ONCE DAILY AT 6 PM 06/20/19   David Brooks, MD  baclofen (LIORESAL) 10 MG tablet TAKE 1 TABLET THREE TIMES DAILY 11/15/19   David Market, DO  clopidogrel (PLAVIX) 75 MG tablet TAKE 1 TABLET BY MOUTH ONCE DAILY IN THE MORNING 06/20/19   David Brooks, MD  clotrimazole (LOTRIMIN) 1 % cream Apply to affected area 2 times daily 11/03/19   David Born, PA-C  DULoxetine (CYMBALTA) 60 MG capsule Take 1 capsule (60 mg total) by mouth daily. 07/20/19   David Register, MD  fenofibrate (TRICOR) 48 MG tablet Take 1 tablet (48 mg total) by mouth daily. 06/20/19   David Brooks, MD  gabapentin (NEURONTIN) 300 MG capsule TAKE 1 CAPSULE BY MOUTH IN THE MORNING, TAKE 1 CAPSULE BY MOUTH IN THE EVENING AND TAKE 2 CAPSULES BY MOUTH AT BEDTIME 06/20/19   David Brooks, MD  icosapent Ethyl (VASCEPA) 1 g capsule TAKE 2 CAPSULES BY MOUTH TWICE DAILY WITH MEALS 12/12/19   David Market, DO  isosorbide mononitrate (IMDUR) 60 MG 24 hr tablet Take 1 tablet (60 mg total) by mouth daily. 06/20/19   David Brooks, MD  losartan (COZAAR) 100 MG tablet Take 1 tablet (100 mg total) by mouth daily. 06/20/19   David Brooks, MD  metoprolol tartrate (LOPRESSOR) 25 MG tablet Take 1 tablet (25 mg total) by mouth 2 (two) times daily. 06/20/19   David Brooks, MD  Multiple Vitamin (MULTI-VITAMIN DAILY  PO) Take 1 Dose by mouth daily. [employer] vitamin pack takes 1 packet by mouth daily    [provider]  nitroGLYCERIN (NITROSTAT) 0.4 MG SL tablet Place 1 tablet (0.4 mg total) under the tongue every 5 (five) minutes as needed for chest pain. Patient not taking: Reported on 08/24/2019 07/20/19   David Register, MD  nystatin cream (MYCOSTATIN) Apply to affected area 2 times daily 02/06/20   Bill Salinas, PA-C  oxyCODONE-acetaminophen (PERCOCET) 7.5-325 MG tablet Take 1 tablet by mouth every 4 (four) hours as needed for severe pain. Patient not taking: Reported on 08/07/2019 07/10/19   David Brooks, MD  ranolazine (RANEXA) 500 MG 12 hr tablet Take 1 tablet (500 mg total) by mouth 2 (two) times daily. 06/20/19   David Brooks, MD  Allergies    Celexa [citalopram] and Codeine  Review of Systems   Review of Systems  Constitutional: Negative.  Negative for chills and fever.  Gastrointestinal: Negative.  Negative for abdominal pain, anal bleeding, diarrhea, nausea and vomiting.  Genitourinary: Negative.  Negative for dysuria, hematuria, scrotal swelling and testicular pain.  Skin: Positive for rash.    Physical Exam Updated Vital Signs BP 114/79   Pulse 81   Temp 98.5 F (36.9 C) (Oral)   Resp 18   Ht 5\' 10"  (1.778 m)   Wt 108.9 kg   SpO2 98%   BMI 34.44 kg/m   Physical Exam Constitutional:      General: He is not in acute distress.    Appearance: Normal appearance. He is well-developed. He is obese. He is not ill-appearing or diaphoretic.  HENT:     Head: Normocephalic and atraumatic.  Eyes:     General: Vision grossly intact. Gaze aligned appropriately.     Pupils: Pupils are equal, round, and reactive to light.  Neck:     Trachea: Trachea and phonation normal.  Pulmonary:     Effort: Pulmonary effort is normal. No respiratory distress.  Abdominal:     General: There is no distension.     Palpations: Abdomen is soft.     Tenderness: There is no abdominal  tenderness. There is no guarding or rebound.  Genitourinary:    Comments: Rectal examination [facility].  Large amount of perianal warts present, no bleeding or drainage.  Small amount of white buildup present between some of the superior warts.  No induration or fluctuance.  Internal examination deferred by patient.  Penile/testicular examination deferred by patient. Musculoskeletal:        General: Normal range of motion.     Cervical back: Normal range of motion.  Skin:    General: Skin is warm and dry.  Neurological:     Mental Status: He is alert.     GCS: GCS eye subscore is 4. GCS verbal subscore is 5. GCS motor subscore is 6.     Comments: Speech is clear and goal oriented, follows commands Major Cranial nerves without deficit, no facial droop Moves extremities without ataxia, coordination intact  Psychiatric:        Behavior: Behavior normal.     ED Results / Procedures / Treatments   Labs (all labs ordered are listed, but only abnormal results are displayed) Labs Reviewed - No data to display  EKG None  Radiology No results found.  Procedures Procedures (including critical care time)  Medications Ordered in ED Medications - No data to display  ED Course  I have reviewed the triage vital signs and the nursing notes.  Pertinent labs & imaging results that were available during my care of the patient were reviewed by me and considered in my medical decision making (see chart for details).    MDM Rules/Calculators/A&P                         Additional History Obtained: 1. Nursing notes from this visit. -------------- 44 year old male arrives well-appearing no acute distress with itchy rash of his perianal area for 3 weeks.  Physical examination shows perianal warts with what appears to be white plaques consistent with small amount of candidiasis.  There is no evidence of cellulitis, abscess, fistula or other emergent processes.  Patient is  well-appearing and in no acute distress no negation for blood work or  imaging at this time. He has been having regular bowel movements without pain or bleeding.  Plan of care is to prescribe nystatin cream and have him follow-up with his primary care doctor this week for recheck.  He was also given referral to general surgery if his primary care doctor is unable to treat the perianal warts.  Additionally patient defers STD testing today reports he is not sexually active, suspect these warts have been present for many years, he will follow-up with his primary care provider.  At this time there does not appear to be any evidence of an acute emergency medical condition and the patient appears stable for discharge with appropriate outpatient follow up. Diagnosis was discussed with patient who verbalizes understanding of care plan and is agreeable to discharge. I have discussed return precautions with patient who verbalizes understanding. Patient encouraged to follow-up with their PCP. All questions answered.   Note: Portions of this report may have been transcribed using voice recognition software. Every effort was made to ensure accuracy; however, inadvertent computerized transcription errors may still be present. Final Clinical Impression(s) / ED Diagnoses Final diagnoses:  Perianal condyloma acuminatum  Candidiasis  Anal pruritus    Rx / DC Orders ED Discharge Orders         Ordered    nystatin cream (MYCOSTATIN)     Discontinue  Reprint     02/06/20 1243           Bill Salinas, PA-C 02/06/20 1249    Derwood Kaplan, MD 02/14/20 [phone]

## 2020-02-06 NOTE — ED Triage Notes (Signed)
Pt reports yeast infection between his buttocks, ongoing for 3 weeks, using OTC medications without relief.

## 2020-02-16 DIAGNOSIS — Z79899 Other long term (current) drug therapy: Secondary | ICD-10-CM | POA: Diagnosis not present

## 2020-02-16 DIAGNOSIS — G8929 Other chronic pain: Secondary | ICD-10-CM | POA: Diagnosis not present

## 2020-02-16 DIAGNOSIS — M545 Low back pain: Secondary | ICD-10-CM | POA: Diagnosis not present

## 2020-02-16 DIAGNOSIS — G894 Chronic pain syndrome: Secondary | ICD-10-CM | POA: Diagnosis not present

## 2020-02-22 ENCOUNTER — Telehealth: Payer: Medicare Other | Admitting: Internal Medicine

## 2020-03-18 DIAGNOSIS — G894 Chronic pain syndrome: Secondary | ICD-10-CM | POA: Diagnosis not present

## 2020-03-18 DIAGNOSIS — Z79899 Other long term (current) drug therapy: Secondary | ICD-10-CM | POA: Diagnosis not present

## 2020-03-18 DIAGNOSIS — M545 Low back pain: Secondary | ICD-10-CM | POA: Diagnosis not present

## 2020-03-18 DIAGNOSIS — G8929 Other chronic pain: Secondary | ICD-10-CM | POA: Diagnosis not present

## 2020-03-18 DIAGNOSIS — R7303 Prediabetes: Secondary | ICD-10-CM | POA: Diagnosis not present

## 2020-03-27 ENCOUNTER — Other Ambulatory Visit: Payer: Self-pay | Admitting: Family Medicine

## 2020-04-18 DIAGNOSIS — R7303 Prediabetes: Secondary | ICD-10-CM | POA: Diagnosis not present

## 2020-04-18 DIAGNOSIS — Z760 Encounter for issue of repeat prescription: Secondary | ICD-10-CM | POA: Diagnosis not present

## 2020-04-18 DIAGNOSIS — G8929 Other chronic pain: Secondary | ICD-10-CM | POA: Diagnosis not present

## 2020-04-18 DIAGNOSIS — Z79899 Other long term (current) drug therapy: Secondary | ICD-10-CM | POA: Diagnosis not present

## 2020-04-18 DIAGNOSIS — M545 Low back pain: Secondary | ICD-10-CM | POA: Diagnosis not present

## 2020-04-19 DIAGNOSIS — Z23 Encounter for immunization: Secondary | ICD-10-CM | POA: Diagnosis not present

## 2020-05-20 DIAGNOSIS — I10 Essential (primary) hypertension: Secondary | ICD-10-CM | POA: Diagnosis not present

## 2020-05-20 DIAGNOSIS — R7303 Prediabetes: Secondary | ICD-10-CM | POA: Diagnosis not present

## 2020-05-20 DIAGNOSIS — M545 Low back pain, unspecified: Secondary | ICD-10-CM | POA: Diagnosis not present

## 2020-05-20 DIAGNOSIS — Z79899 Other long term (current) drug therapy: Secondary | ICD-10-CM | POA: Diagnosis not present

## 2020-05-20 DIAGNOSIS — G8929 Other chronic pain: Secondary | ICD-10-CM | POA: Diagnosis not present

## 2020-05-20 DIAGNOSIS — E559 Vitamin D deficiency, unspecified: Secondary | ICD-10-CM | POA: Diagnosis not present

## 2020-06-19 DIAGNOSIS — Z79899 Other long term (current) drug therapy: Secondary | ICD-10-CM | POA: Diagnosis not present

## 2020-06-19 DIAGNOSIS — G8929 Other chronic pain: Secondary | ICD-10-CM | POA: Diagnosis not present

## 2020-06-19 DIAGNOSIS — M545 Low back pain, unspecified: Secondary | ICD-10-CM | POA: Diagnosis not present

## 2020-07-31 ENCOUNTER — Other Ambulatory Visit: Payer: Self-pay | Admitting: Family Medicine

## 2021-04-04 IMAGING — MR MRI LUMBAR SPINE WITHOUT CONTRAST
4 of 5 series · 24 of 48 positions shown · non-contrast
Comparison: Prior radiograph from 10/11/2018 as well as previous
MRI from 07/05/2015.

CLINICAL DATA: Initial evaluation for chronic low back pain with
left leg pain, weakness, and numbness.

EXAM:
MRI LUMBAR SPINE WITHOUT CONTRAST
TECHNIQUE: Multiplanar, multisequence MR imaging of the lumbar spine was
performed. No intravenous contrast was administered.

[Series 5: T2 · sagittal · 4.0mm · 0.73mm/px · 6 of 13 slices shown (1 of 2)]
[im 1/13]
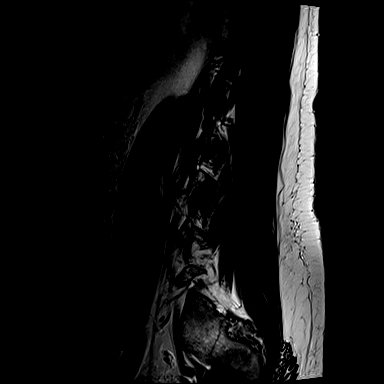
[im 3/13]
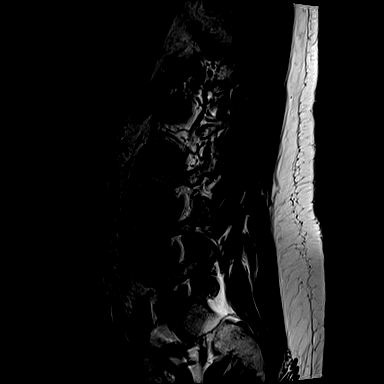
[im 5/13]
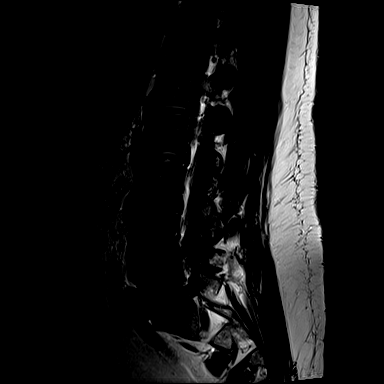
[im 8/13]
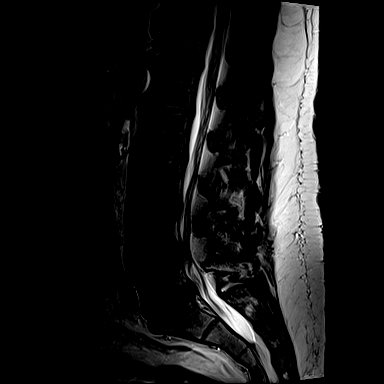
[im 10/13]
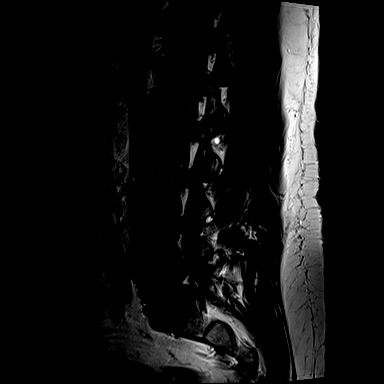
[im 13/13]
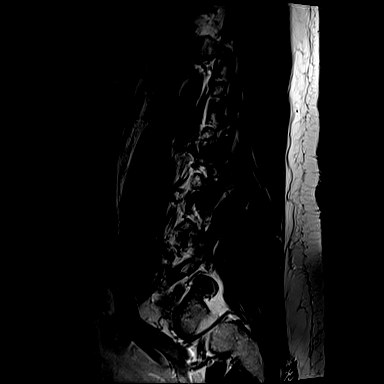

[Series 6: T1 · sagittal · 4.0mm · 0.88mm/px · 5 of 13 slices shown (1 of 2)]
[im 1/13]
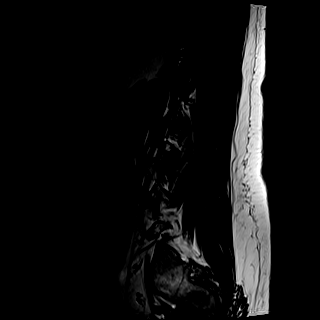
[im 4/13]
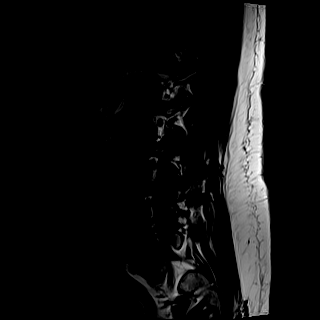
[im 7/13]
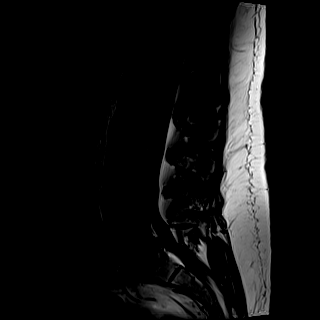
[im 10/13]
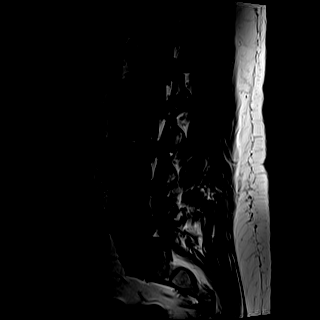
[im 13/13]
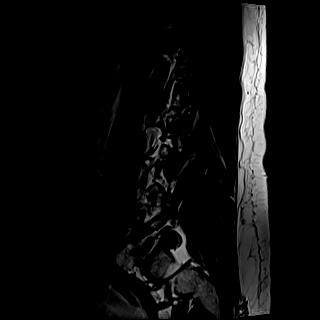

[Series 10: T1 · axial · 4.0mm · 0.35mm/px · z∈[-66,+109]mm · 3 of 42 slices shown (2 of 2)]
[im 6/42]
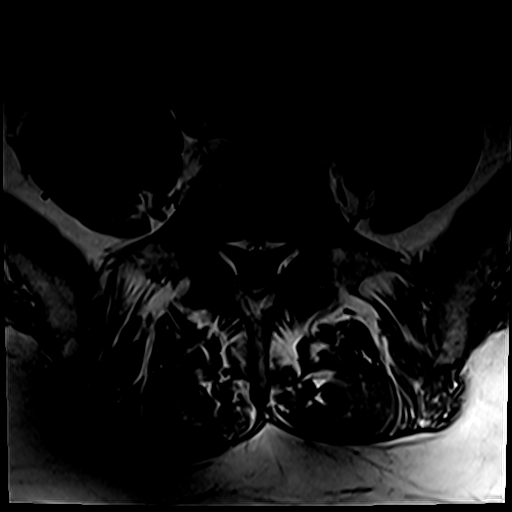
[im 22/42]
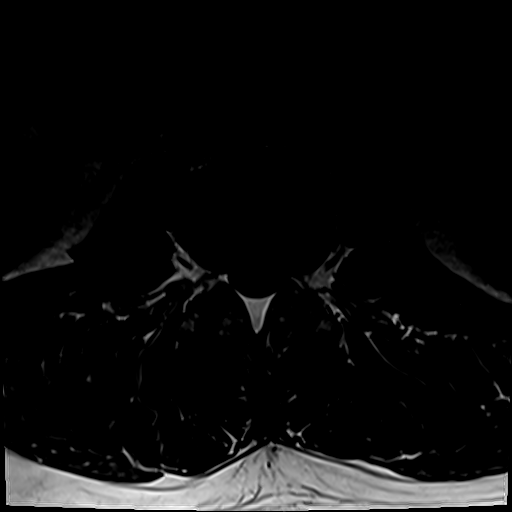
[im 36/42]
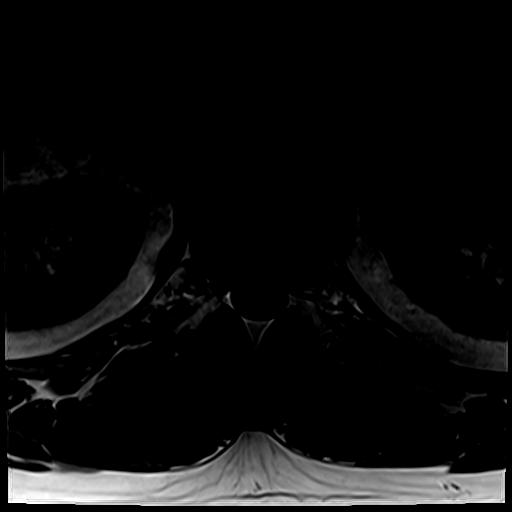

[Series 13: T2 · axial · 4.0mm · 0.35mm/px · z∈[-81,+147]mm · 10 of 42 slices shown (2 of 2)]
[im 3/42]
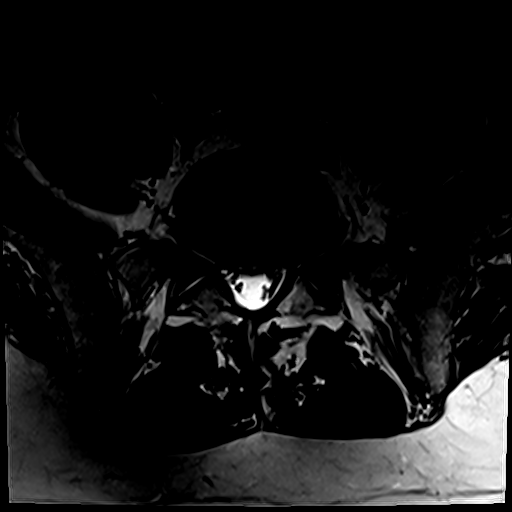
[im 6/42]
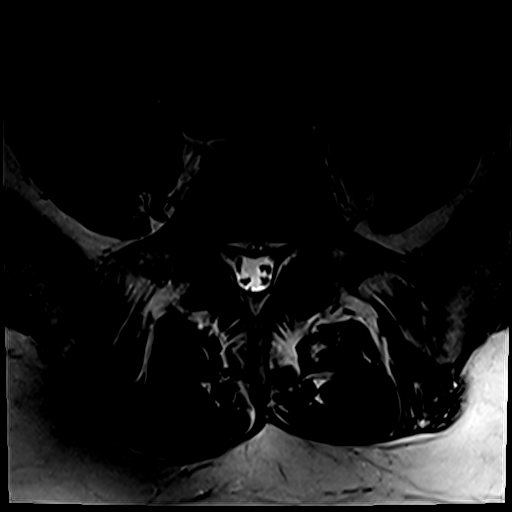
[im 9/42]
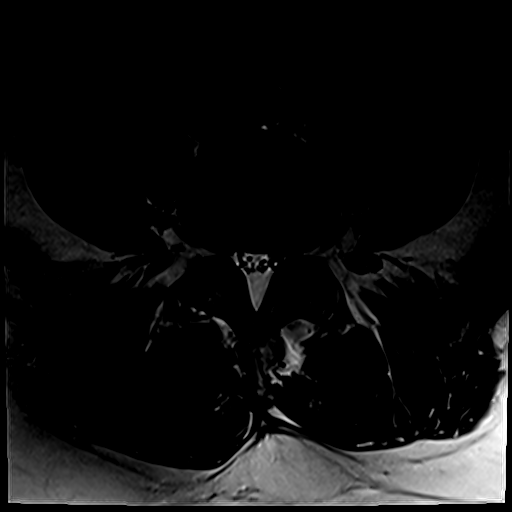
[im 14/42]
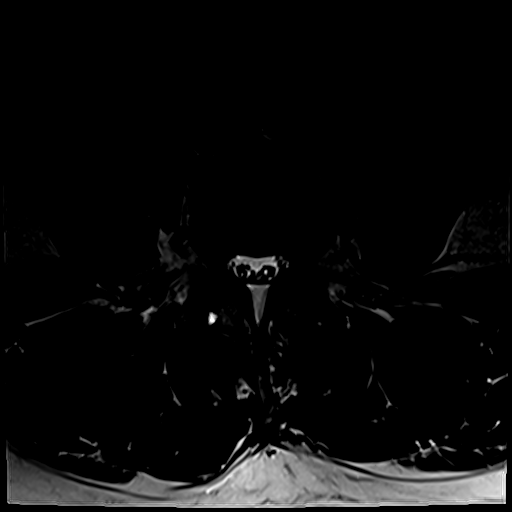
[im 20/42]
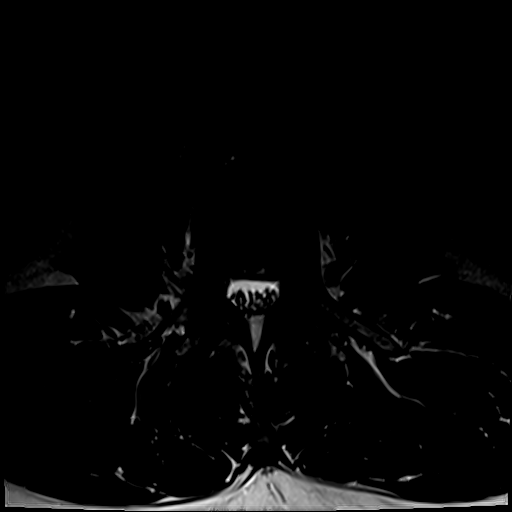
[im 22/42]
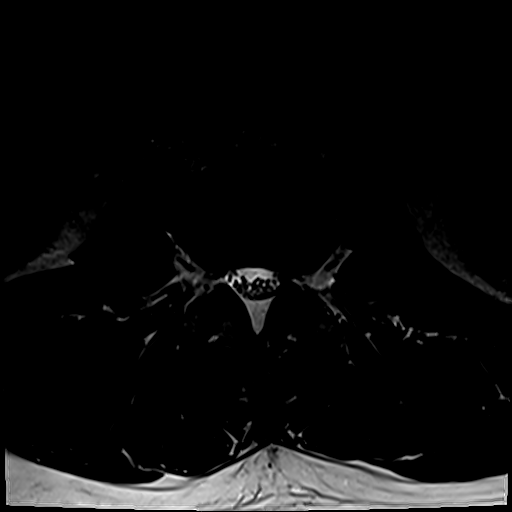
[im 25/42]
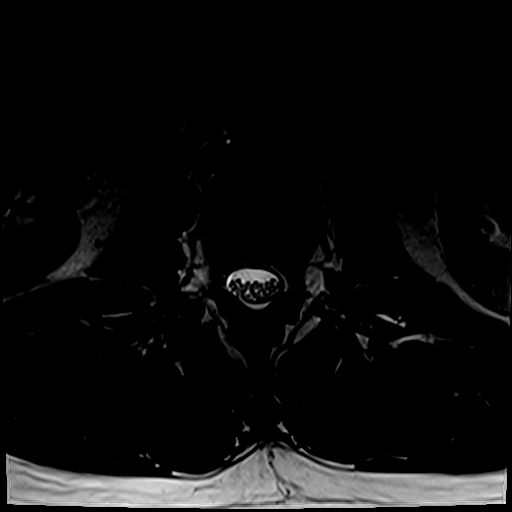
[im 31/42]
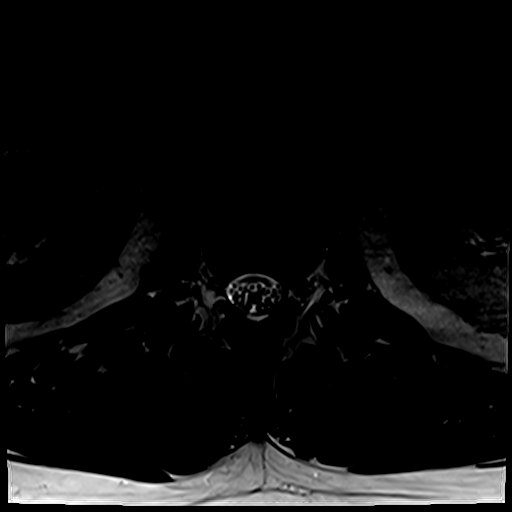
[im 36/42]
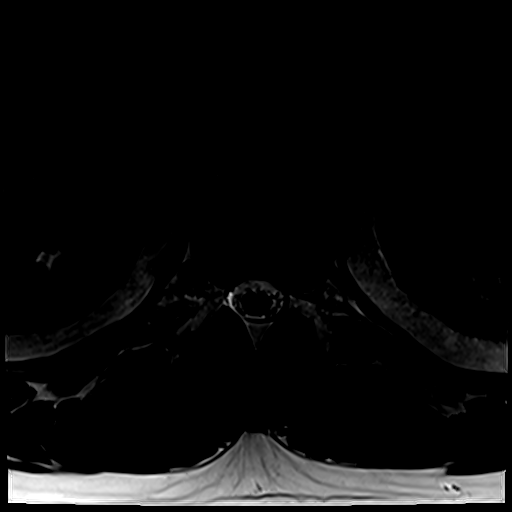
[im 42/42]
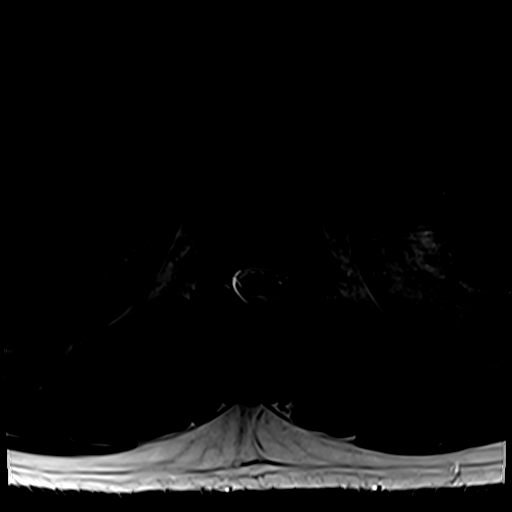

[24 of 48 positions shown; findings below may reference images not displayed]

FINDINGS: Segmentation: Standard. Lowest well-formed disc labeled the L5-S1
level.

Alignment: Physiologic with preservation of the normal lumbar
lordosis. No listhesis.

Vertebrae: Vertebral body heights maintained without evidence for
acute or chronic fracture. Bone marrow signal intensity within
normal limits. No discrete or worrisome osseous lesions. No abnormal
marrow edema.

Conus medullaris and cauda equina: Conus extends to the L1 level.
Conus and cauda equina appear normal.

Paraspinal and other soft tissues: Paraspinous soft tissues within
normal limits. Visualized visceral structures unremarkable.

Disc levels:

L1-2:  Unremarkable.

L2-3:  Unremarkable.

L3-4: Mild dorsal epidural lipomatosis. Mild facet hypertrophy. No
canal or foraminal stenosis.

L4-5: Negative interspace. Dorsal epidural lipomatosis. Mild to
moderate facet and ligament flavum hypertrophy. Resultant mild canal
with left lateral recess narrowing. Mild bilateral L4 foraminal
stenosis.

L5-S1: Chronic intervertebral disc space narrowing with diffuse disc
bulge and disc desiccation. Mild reactive endplate changes.
Broad-based central disc protrusion indents the ventral thecal sac,
encroaching upon the lateral recesses bilaterally. Associated
annular fissure. Protruding disc contacts the descending S1 nerve
root as it courses through the lateral recesses, similar to
previous. Mild epidural lipomatosis and facet hypertrophy. Resultant
mild to moderate lateral recess narrowing. Central canal remains
patent. Moderate bilateral L5 foraminal stenosis.
IMPRESSION: 1. Shallow broad-based posterior disc protrusion at L5-S1 with
resultant mild to moderate lateral recess narrowing, potentially
affecting either of the descending S1 nerve roots.
2. Moderate bilateral L5 foraminal stenosis related to disc bulge,
reactive endplate changes, and facet hypertrophy.
3. Multifactorial degenerative changes at L4-5 with resultant mild
canal with left lateral recess narrowing, with mild bilateral L4
foraminal stenosis.

## 2021-07-26 ENCOUNTER — Other Ambulatory Visit: Payer: Self-pay | Admitting: Family Medicine

## 2022-05-13 ENCOUNTER — Other Ambulatory Visit: Payer: Self-pay

## 2022-05-13 ENCOUNTER — Emergency Department (HOSPITAL_COMMUNITY)
Admission: EM | Admit: 2022-05-13 | Discharge: 2022-05-13 | Discharge status: Against Medical Advice | Payer: Medicare HMO | Attending: Emergency Medicine | Admitting: Emergency Medicine

## 2022-05-13 ENCOUNTER — Emergency Department (HOSPITAL_COMMUNITY): Payer: Medicare HMO

## 2022-05-13 DIAGNOSIS — R079 Chest pain, unspecified: Secondary | ICD-10-CM | POA: Diagnosis not present

## 2022-05-13 DIAGNOSIS — R9431 Abnormal electrocardiogram [ECG] [EKG]: Secondary | ICD-10-CM | POA: Diagnosis not present

## 2022-05-13 DIAGNOSIS — Z79899 Other long term (current) drug therapy: Secondary | ICD-10-CM | POA: Insufficient documentation

## 2022-05-13 DIAGNOSIS — Z951 Presence of aortocoronary bypass graft: Secondary | ICD-10-CM | POA: Diagnosis not present

## 2022-05-13 DIAGNOSIS — I2510 Atherosclerotic heart disease of native coronary artery without angina pectoris: Secondary | ICD-10-CM | POA: Insufficient documentation

## 2022-05-13 DIAGNOSIS — Z5321 Procedure and treatment not carried out due to patient leaving prior to being seen by health care provider: Secondary | ICD-10-CM | POA: Insufficient documentation

## 2022-05-13 DIAGNOSIS — T402X1A Poisoning by other opioids, accidental (unintentional), initial encounter: Secondary | ICD-10-CM | POA: Diagnosis present

## 2022-05-13 DIAGNOSIS — I10 Essential (primary) hypertension: Secondary | ICD-10-CM | POA: Insufficient documentation

## 2022-05-13 DIAGNOSIS — M545 Low back pain, unspecified: Secondary | ICD-10-CM | POA: Insufficient documentation

## 2022-05-13 DIAGNOSIS — R000 Tachycardia, unspecified: Secondary | ICD-10-CM | POA: Insufficient documentation

## 2022-05-13 DIAGNOSIS — Z5329 Procedure and treatment not carried out because of patient's decision for other reasons: Secondary | ICD-10-CM

## 2022-05-13 DIAGNOSIS — R7989 Other specified abnormal findings of blood chemistry: Secondary | ICD-10-CM | POA: Insufficient documentation

## 2022-05-13 DIAGNOSIS — R464 Slowness and poor responsiveness: Secondary | ICD-10-CM | POA: Diagnosis not present

## 2022-05-13 DIAGNOSIS — Z720 Tobacco use: Secondary | ICD-10-CM | POA: Insufficient documentation

## 2022-05-13 LAB — CBC WITH DIFFERENTIAL/PLATELET
Abs Immature Granulocytes: 0.03 10*3/uL (ref 0.00–0.07)
Basophils Absolute: 0 10*3/uL (ref 0.0–0.1)
Basophils Relative: 0 %
Eosinophils Absolute: 0.1 10*3/uL (ref 0.0–0.5)
Eosinophils Relative: 1 %
HCT: 43.5 % (ref 39.0–52.0)
Hemoglobin: 14.6 g/dL (ref 13.0–17.0)
Immature Granulocytes: 0 %
Lymphocytes Relative: 18 %
Lymphs Abs: 1.9 10*3/uL (ref 0.7–4.0)
MCH: 31.5 pg (ref 26.0–34.0)
MCHC: 33.6 g/dL (ref 30.0–36.0)
MCV: 93.8 fL (ref 80.0–100.0)
Monocytes Absolute: 0.6 10*3/uL (ref 0.1–1.0)
Monocytes Relative: 6 %
Neutro Abs: 8.2 10*3/uL — ABNORMAL HIGH (ref 1.7–7.7)
Neutrophils Relative %: 75 %
Platelets: 387 10*3/uL (ref 150–400)
RBC: 4.64 MIL/uL (ref 4.22–5.81)
RDW: 14.6 % (ref 11.5–15.5)
WBC: 10.9 10*3/uL — ABNORMAL HIGH (ref 4.0–10.5)
nRBC: 0 % (ref 0.0–0.2)

## 2022-05-13 LAB — COMPREHENSIVE METABOLIC PANEL
ALT: 13 U/L (ref 0–44)
AST: 22 U/L (ref 15–41)
Albumin: 4.1 g/dL (ref 3.5–5.0)
Alkaline Phosphatase: 46 U/L (ref 38–126)
Anion gap: 13 (ref 5–15)
BUN: 6 mg/dL (ref 6–20)
CO2: 20 mmol/L — ABNORMAL LOW (ref 22–32)
Calcium: 9.4 mg/dL (ref 8.9–10.3)
Chloride: 106 mmol/L (ref 98–111)
Creatinine, Ser: 1.02 mg/dL (ref 0.61–1.24)
GFR, Estimated: >60 mL/min (ref >60)
Glucose, Bld: 197 mg/dL — ABNORMAL HIGH (ref 70–99)
Potassium: 3.7 mmol/L (ref 3.5–5.1)
Sodium: 139 mmol/L (ref 135–145)
Total Bilirubin: 0.8 mg/dL (ref 0.3–1.2)
Total Protein: 6.8 g/dL (ref 6.5–8.1)

## 2022-05-13 LAB — TROPONIN I (HIGH SENSITIVITY)
Troponin I (High Sensitivity): 23 ng/L — ABNORMAL HIGH (ref <18)
Troponin I (High Sensitivity): 173 ng/L (ref <18)

## 2022-05-13 MED ORDER — LACTATED RINGERS IV BOLUS
1000.0000 mL | Freq: Once | INTRAVENOUS | Status: AC
  Administered 2022-05-13: 1000 mL via INTRAVENOUS

## 2022-05-13 MED ORDER — NALOXONE HCL 4 MG/0.1ML NA LIQD: NASAL | 0 refills | Status: DC

## 2022-05-13 NOTE — ED Provider Notes (Signed)
[facility] EMERGENCY DEPARTMENT Provider Note   CSN: [id number] Arrival date & time: 05/13/22  1516     History  Chief Complaint  Patient presents with   Chest Pain   Drug Overdose    [name] is a 46 y.o. male.  Patient is a 46 year old male with a past medical history of CAD status post CABG and stent placement, tobacco use, hypertension, hyperlipidemia and chronic back pain on Percocet presenting to the emergency department with chest pain and accidental overdose.  The patient states that he was working in his kitchen today when he started to have some low back pain.  He states that he took about 5 of his Percocets and 3 ibuprofens and next thing he knew he woke up in the ambulance.  Medics state that they were called for unresponsiveness in he was unresponsive with pinpoint pupils on their arrival and woke up after Narcan.  Patient states that his overdose was unintentional and has no SI.  Patient also endorses having chest pain.  He states that he gets near daily chest pain for the last several weeks.  He denies any shortness of breath, lightheadedness or dizziness.  He states that he has not seen a cardiologist for at least the last 2 years.  The history is provided by the patient and the EMS personnel.  Chest Pain Drug Overdose Associated symptoms include chest pain.       Home Medications Prior to Admission medications   Medication Sig Start Date End Date Taking? Authorizing Provider  naloxone [facility]) nasal spray 4 mg/0.1 mL Spray 1 squirt in each nostril for signs of opioid overdose including unresponsiveness and decreased breathing 05/13/22  Yes Kingsley, Turkey K, DO  atorvastatin (LIPITOR) 80 MG tablet TAKE 1 TABLET BY MOUTH ONCE DAILY AT 6 PM 07/31/20   Donita Brooks, MD  baclofen (LIORESAL) 10 MG tablet TAKE 1 TABLET THREE TIMES DAILY 11/15/19   Arvilla Market, MD  clopidogrel (PLAVIX) 75 MG tablet TAKE 1 TABLET BY MOUTH ONCE  DAILY IN THE MORNING 07/31/20   Donita Brooks, MD  clotrimazole (LOTRIMIN) 1 % cream Apply to affected area 2 times daily 11/03/19   Bethel Born, PA-C  DULoxetine (CYMBALTA) 60 MG capsule Take 1 capsule (60 mg total) by mouth daily. 07/20/19   Hoy Register, MD  fenofibrate (TRICOR) 48 MG tablet Take 1 tablet (48 mg total) by mouth daily. 06/20/19   Donita Brooks, MD  gabapentin (NEURONTIN) 300 MG capsule TAKE 1 CAPSULE BY MOUTH IN THE MORNING, TAKE 1 CAPSULE BY MOUTH IN THE EVENING AND TAKE 2 CAPSULES BY MOUTH AT BEDTIME 06/20/19   Donita Brooks, MD  icosapent Ethyl (VASCEPA) 1 g capsule TAKE 2 CAPSULES BY MOUTH TWICE DAILY WITH MEALS 12/12/19   Arvilla Market, MD  isosorbide mononitrate (IMDUR) 60 MG 24 hr tablet Take 1 tablet (60 mg total) by mouth daily. 06/20/19   Donita Brooks, MD  losartan (COZAAR) 100 MG tablet TAKE 1 TABLET (100 MG TOTAL) BY MOUTH DAILY. 07/31/20   Donita Brooks, MD  metoprolol tartrate (LOPRESSOR) 25 MG tablet Take 1 tablet (25 mg total) by mouth 2 (two) times daily. 06/20/19   Donita Brooks, MD  Multiple Vitamin (MULTI-VITAMIN DAILY PO) Take 1 Dose by mouth daily. [employer] vitamin pack takes 1 packet by mouth daily    [provider]  nitroGLYCERIN (NITROSTAT) 0.4 MG SL tablet Place 1 tablet (0.4 mg total) under  the tongue every 5 (five) minutes as needed for chest pain. Patient not taking: Reported on 08/24/2019 07/20/19   Charlott Rakes, MD  nystatin cream (MYCOSTATIN) Apply to affected area 2 times daily 02/06/20   Deliah Boston, PA-C  oxyCODONE-acetaminophen (PERCOCET) 10-325 MG tablet Take 1 tablet by mouth every 8 (eight) hours as needed. 12/22/19   [provider]  ranolazine (RANEXA) 500 MG 12 hr tablet Take 1 tablet (500 mg total) by mouth 2 (two) times daily. 06/20/19   Susy Frizzle, MD  Vitamin D, Ergocalciferol, (DRISDOL) 1.25 MG (50000 UNIT) CAPS capsule Take 50,000 Units by mouth once a week. 01/08/20    [provider]      Allergies    Celexa [citalopram] and Codeine    Review of Systems   Review of Systems  Cardiovascular:  Positive for chest pain.    Physical Exam Updated Vital Signs BP 118/77   Pulse 92   Temp 99.3 F (37.4 C)   Resp 16   SpO2 94%  Physical Exam Vitals and nursing note reviewed.  Constitutional:      General: He is not in acute distress.    Appearance: He is well-developed.  HENT:     Head: Normocephalic and atraumatic.  Eyes:     Pupils: Pupils are equal, round, and reactive to light.  Cardiovascular:     Rate and Rhythm: Regular rhythm. Tachycardia present.     Pulses:          Radial pulses are 2+ on the right side and 2+ on the left side.     Heart sounds: Normal heart sounds.  Pulmonary:     Effort: Pulmonary effort is normal.     Breath sounds: Normal breath sounds.  Abdominal:     Palpations: Abdomen is soft.     Tenderness: There is no abdominal tenderness.  Musculoskeletal:        General: Normal range of motion.     Cervical back: Normal range of motion and neck supple.     Right lower leg: No edema.     Left lower leg: No edema.  Skin:    General: Skin is warm and dry.  Neurological:     General: No focal deficit present.     Mental Status: He is alert and oriented to person, place, and time.  Psychiatric:        Mood and Affect: Mood normal.        Behavior: Behavior normal.     ED Results / Procedures / Treatments   Labs (all labs ordered are listed, but only abnormal results are displayed) Labs Reviewed  CBC WITH DIFFERENTIAL/PLATELET - Abnormal; Notable for the following components:      Result Value   WBC 10.9 (*)    Neutro Abs 8.2 (*)    All other components within normal limits  COMPREHENSIVE METABOLIC PANEL - Abnormal; Notable for the following components:   CO2 20 (*)    Glucose, Bld 197 (*)    All other components within normal limits  TROPONIN I (HIGH SENSITIVITY) - Abnormal; Notable for the  following components:   Troponin I (High Sensitivity) 23 (*)    All other components within normal limits  TROPONIN I (HIGH SENSITIVITY) - Abnormal; Notable for the following components:   Troponin I (High Sensitivity) 173 (*)    All other components within normal limits  RAPID URINE DRUG SCREEN, HOSP PERFORMED    EKG EKG Interpretation  Date/Time:  Wednesday May 13 2022 17:50:45 EDT Ventricular Rate:  88 PR Interval:  180 QRS Duration: 94 QT Interval:  366 QTC Calculation: 443 R Axis:   16 Text Interpretation: Sinus rhythm Left atrial enlargement Abnormal R-wave progression, early transition Inferior infarct, old Anterior ST depressions now resolved Confirmed by Elayne Snare ([id number]) on 05/13/2022 7:16:28 PM  Radiology DG Chest 1 View  Result Date: 05/13/2022 CLINICAL DATA:  Drug overdose.  Chest pain. EXAM: CHEST  1 VIEW COMPARISON:  05/01/2017 FINDINGS: Artifact overlies the chest. Previous median sternotomy and CABG. The lungs are clear. The vascularity is normal. No effusions. No edema. IMPRESSION: No active disease. Previous CABG. Electronically Signed   By: Paulina Fusi M.D.   On: 05/13/2022 15:50    Procedures Procedures    Medications Ordered in ED Medications  lactated ringers bolus 1,000 mL (1,000 mLs Intravenous New Bag/Given 05/13/22 1749)    ED Course/ Medical Decision Making/ A&P Clinical Course as of 05/13/22 1918  Wed May 13, 2022  1738 Upon reassessment, the patient reports his chest pain has resolved.  Due to patient's elevated troponin with significant cardiac risk factors he was recommended admission for observation but states he would prefer to be discharged.  He is agreeable for repeat troponin and disposition decision will be read determined upon reassessment after repeat troponin. [VK]  1913 Upon reassessment, patient's troponin has increased to 170 from the 20s.  The patient was recommended admission for his uptrending troponin in the setting  of chest pain and significant cardiac risk factors. The patient has decided to leave against medical advice.  The patient had a normal mental status examination and understands his condition and the risks of leaving including hear attack leading to permanent disability and death. The patient has had an opportunity to ask questions about his medical condition. The patient has been informed that he may return for care at any time and has been referred to his   Physician and has been given cardiology referral. The patient's family member was present for the conversation.  [VK]    Clinical Course User Index [VK] Rexford Maus, DO           HEART Score: 6                Medical Decision Making This patient presents to the ED with chief complaint(s) of unintentional overdose and chest pain with pertinent past medical history of CAD status post CABG, chronic back pain on Percocet which further complicates the presenting complaint. The complaint involves an extensive differential diagnosis and also carries with it a high risk of complications and morbidity.    The differential diagnosis includes opioid overdose, ACS, arrhythmia, anemia, electrolyte abnormality, pneumonia, pneumothorax, pulmonary edema, pleural effusion  Additional history obtained: Additional history obtained from family and EMS  Records reviewed Care Everywhere/External Records  ED Course and Reassessment: Patient was initially called as a prehospital STEMI due to ST depressions anteriorly and possible inferior ST elevations.  Cardiology reviewed the EKG and determined it did not meet STEMI criteria and was more likely rate related ST depressions.  The patient was awake and alert upon his arrival.  He will be monitored on pulse oximetry for possible respiratory depression as well as have work-up for cause of his chest pain.  Independent labs interpretation:  The following labs were independently interpreted: Increasing  elevated troponin otherwise within normal range  Independent visualization of imaging: - I independently visualized the following imaging with scope of interpretation  limited to determining acute life threatening conditions related to emergency care: Chest x-ray, which revealed acute disease  Consultation: - Consulted or discussed management/test interpretation w/ external professional: N/A  Consideration for admission or further workup: Patient was recommended admission for further work-up and evaluation however chose to leave AGAINST MEDICAL ADVICE Social Determinants of health: N/A    Amount and/or Complexity of Data Reviewed Labs: ordered. Radiology: ordered.          Final Clinical Impression(s) / ED Diagnoses Final diagnoses:  Left against medical advice  Opioid overdose, accidental or unintentional, initial encounter (HCC)  Chest pain, unspecified type  Elevated troponin    Rx / DC Orders ED Discharge Orders          Ordered    Ambulatory referral to Cardiology       Comments: If you have not heard from the Cardiology office within the next 72 hours please call [phone].   05/13/22 1915    naloxone [facility]) nasal spray 4 mg/0.1 mL        05/13/22 1917              Rexford Maus, DO 05/13/22 1918

## 2022-05-13 NOTE — ED Notes (Signed)
Pt reports taking 5 tablets of 10mg  hydrocodone due to chronic lower back pain. Pt denies SI attempt.

## 2022-05-13 NOTE — Progress Notes (Signed)
Interventional cardiology note:  Called to emergency department due to possible inferior STEMI..  The patient is a 46 year old with a history of substance abuse and a history of multivessel CABG.  Last cardiac catheterization in 2013 demonstrated patent LIMA to LAD and all vein grafts were occluded.  He does have a stent in the right coronary artery and moderate to severe disease of the R PLV branch.  Per EMS the patient was found unresponsive.  He received multiple doses of Narcan.  EKG in the field demonstrated sinus rhythm with inferior Q waves and ST elevations.  On arrival here the patient reported that he had his chronic level of chest pain but was not in extremis this was not increased versus his normal level.  Repeat EKG demonstrated no inferior ST elevations.  Q waves inferiorly were present.  Based on review of this data we have elected to defer emergency coronary angiography at this time.

## 2022-05-13 NOTE — Discharge Instructions (Addendum)
You were seen in the emergency department for your accidental opioid overdose and your chest pain.  You were given Narcan by the medics and did not require any additional Narcan here in the emergency department.  I have given you prescription that you should carry on you at all times for any signs of accidental opioid overdose in the future.  We also evaluated you for your chest pain and you did have an elevated heart enzyme that was increasing concern for stress or mild heart attack and we recommended that you stay in the hospital for further work-up, however you chose to leave [place].  You are at risk of heart attack that could lead to permanent disability and death and we recommend that you follow-up with your primary doctor or cardiology within the next 24 hours to have your symptoms rechecked.  You can return to the emergency department if you change your mind for any reason or if you have any other new or concerning symptoms.

## 2022-05-13 NOTE — ED Triage Notes (Signed)
Pt [facility] from homefor unresponsive/drug overdose. Pt reports taking 5 tablets of hydrocodone, upon ems arrival pt unresponsive. Given 2mg  narcan, HR 150-190 w/ ST elevation. Pt reports chest tightness, says he has this every day 8/10. EMS gave 324mg  ASA and 1 SL nitro. 160/82, 124HR, cbg 139, 96% RA. 18g LAC

## 2022-05-13 NOTE — ED Notes (Signed)
Code STEMI activated prior to arrival, cancelled upon pt arrival.

## 2023-06-27 ENCOUNTER — Emergency Department (HOSPITAL_COMMUNITY)
Admission: EM | Admit: 2023-06-27 | Discharge: 2023-06-28 | Disposition: A | Discharge status: Other Acute Inpatient Hospital | Payer: Medicare HMO | Attending: Emergency Medicine | Admitting: Emergency Medicine

## 2023-06-27 DIAGNOSIS — F1010 Alcohol abuse, uncomplicated: Secondary | ICD-10-CM | POA: Diagnosis not present

## 2023-06-27 DIAGNOSIS — F331 Major depressive disorder, recurrent, moderate: Secondary | ICD-10-CM | POA: Diagnosis not present

## 2023-06-27 DIAGNOSIS — F32A Depression, unspecified: Secondary | ICD-10-CM | POA: Diagnosis present

## 2023-06-27 DIAGNOSIS — F29 Unspecified psychosis not due to a substance or known physiological condition: Secondary | ICD-10-CM | POA: Diagnosis not present

## 2023-06-27 DIAGNOSIS — Z79899 Other long term (current) drug therapy: Secondary | ICD-10-CM | POA: Insufficient documentation

## 2023-06-27 DIAGNOSIS — R45851 Suicidal ideations: Secondary | ICD-10-CM | POA: Diagnosis not present

## 2023-06-27 DIAGNOSIS — Y907 Blood alcohol level of 200-239 mg/100 ml: Secondary | ICD-10-CM | POA: Insufficient documentation

## 2023-06-28 ENCOUNTER — Encounter (HOSPITAL_COMMUNITY): Payer: Self-pay | Admitting: *Deleted

## 2023-06-28 ENCOUNTER — Other Ambulatory Visit: Payer: Self-pay

## 2023-06-28 ENCOUNTER — Encounter (HOSPITAL_COMMUNITY): Payer: Self-pay | Admitting: Nurse Practitioner

## 2023-06-28 ENCOUNTER — Inpatient Hospital Stay (HOSPITAL_COMMUNITY)
Admission: AD | Admit: 2023-06-28 | Discharge: 2023-07-03 | DRG: 885 | Disposition: A | Discharge status: Home / Self Care | Payer: Medicare HMO | Source: Intra-hospital | Attending: Psychiatry | Admitting: Psychiatry

## 2023-06-28 DIAGNOSIS — Z955 Presence of coronary angioplasty implant and graft: Secondary | ICD-10-CM | POA: Diagnosis not present

## 2023-06-28 DIAGNOSIS — R45851 Suicidal ideations: Secondary | ICD-10-CM | POA: Diagnosis present

## 2023-06-28 DIAGNOSIS — I10 Essential (primary) hypertension: Secondary | ICD-10-CM | POA: Diagnosis present

## 2023-06-28 DIAGNOSIS — M5431 Sciatica, right side: Secondary | ICD-10-CM | POA: Diagnosis present

## 2023-06-28 DIAGNOSIS — Z818 Family history of other mental and behavioral disorders: Secondary | ICD-10-CM | POA: Diagnosis not present

## 2023-06-28 DIAGNOSIS — F1020 Alcohol dependence, uncomplicated: Secondary | ICD-10-CM | POA: Diagnosis present

## 2023-06-28 DIAGNOSIS — F333 Major depressive disorder, recurrent, severe with psychotic symptoms: Secondary | ICD-10-CM | POA: Diagnosis present

## 2023-06-28 DIAGNOSIS — K029 Dental caries, unspecified: Secondary | ICD-10-CM | POA: Diagnosis present

## 2023-06-28 DIAGNOSIS — G8929 Other chronic pain: Secondary | ICD-10-CM | POA: Diagnosis present

## 2023-06-28 DIAGNOSIS — F411 Generalized anxiety disorder: Secondary | ICD-10-CM | POA: Diagnosis present

## 2023-06-28 DIAGNOSIS — I2510 Atherosclerotic heart disease of native coronary artery without angina pectoris: Secondary | ICD-10-CM | POA: Diagnosis present

## 2023-06-28 DIAGNOSIS — F17210 Nicotine dependence, cigarettes, uncomplicated: Secondary | ICD-10-CM | POA: Diagnosis present

## 2023-06-28 DIAGNOSIS — I252 Old myocardial infarction: Secondary | ICD-10-CM

## 2023-06-28 DIAGNOSIS — F1220 Cannabis dependence, uncomplicated: Secondary | ICD-10-CM | POA: Diagnosis present

## 2023-06-28 DIAGNOSIS — Z23 Encounter for immunization: Secondary | ICD-10-CM

## 2023-06-28 DIAGNOSIS — Y907 Blood alcohol level of 200-239 mg/100 ml: Secondary | ICD-10-CM | POA: Diagnosis present

## 2023-06-28 DIAGNOSIS — F32A Depression, unspecified: Secondary | ICD-10-CM | POA: Diagnosis not present

## 2023-06-28 DIAGNOSIS — F1010 Alcohol abuse, uncomplicated: Secondary | ICD-10-CM | POA: Insufficient documentation

## 2023-06-28 DIAGNOSIS — Z833 Family history of diabetes mellitus: Secondary | ICD-10-CM

## 2023-06-28 DIAGNOSIS — Z5941 Food insecurity: Secondary | ICD-10-CM

## 2023-06-28 DIAGNOSIS — F10239 Alcohol dependence with withdrawal, unspecified: Secondary | ICD-10-CM | POA: Diagnosis present

## 2023-06-28 DIAGNOSIS — Z7985 Long-term (current) use of injectable non-insulin antidiabetic drugs: Secondary | ICD-10-CM | POA: Diagnosis not present

## 2023-06-28 DIAGNOSIS — Z8249 Family history of ischemic heart disease and other diseases of the circulatory system: Secondary | ICD-10-CM

## 2023-06-28 DIAGNOSIS — Z7902 Long term (current) use of antithrombotics/antiplatelets: Secondary | ICD-10-CM | POA: Diagnosis not present

## 2023-06-28 DIAGNOSIS — Z91199 Patient's noncompliance with other medical treatment and regimen due to unspecified reason: Secondary | ICD-10-CM

## 2023-06-28 DIAGNOSIS — F329 Major depressive disorder, single episode, unspecified: Secondary | ICD-10-CM | POA: Diagnosis present

## 2023-06-28 DIAGNOSIS — F331 Major depressive disorder, recurrent, moderate: Secondary | ICD-10-CM | POA: Insufficient documentation

## 2023-06-28 DIAGNOSIS — E782 Mixed hyperlipidemia: Secondary | ICD-10-CM | POA: Diagnosis present

## 2023-06-28 DIAGNOSIS — Z83438 Family history of other disorder of lipoprotein metabolism and other lipidemia: Secondary | ICD-10-CM

## 2023-06-28 DIAGNOSIS — Z79899 Other long term (current) drug therapy: Secondary | ICD-10-CM

## 2023-06-28 DIAGNOSIS — Z7984 Long term (current) use of oral hypoglycemic drugs: Secondary | ICD-10-CM | POA: Diagnosis not present

## 2023-06-28 DIAGNOSIS — Z951 Presence of aortocoronary bypass graft: Secondary | ICD-10-CM

## 2023-06-28 DIAGNOSIS — F17200 Nicotine dependence, unspecified, uncomplicated: Secondary | ICD-10-CM | POA: Diagnosis present

## 2023-06-28 DIAGNOSIS — M51379 Other intervertebral disc degeneration, lumbosacral region without mention of lumbar back pain or lower extremity pain: Secondary | ICD-10-CM | POA: Diagnosis present

## 2023-06-28 DIAGNOSIS — G4700 Insomnia, unspecified: Secondary | ICD-10-CM | POA: Diagnosis present

## 2023-06-28 DIAGNOSIS — R7303 Prediabetes: Secondary | ICD-10-CM | POA: Diagnosis present

## 2023-06-28 DIAGNOSIS — F419 Anxiety disorder, unspecified: Secondary | ICD-10-CM | POA: Diagnosis present

## 2023-06-28 LAB — RAPID URINE DRUG SCREEN, HOSP PERFORMED
Amphetamines: NOT DETECTED
Barbiturates: NOT DETECTED
Benzodiazepines: NOT DETECTED
Cocaine: NOT DETECTED
Opiates: NOT DETECTED
Tetrahydrocannabinol: POSITIVE — AB

## 2023-06-28 LAB — CBC
HCT: 47 % (ref 39.0–52.0)
Hemoglobin: 15.6 g/dL (ref 13.0–17.0)
MCH: 31 pg (ref 26.0–34.0)
MCHC: 33.2 g/dL (ref 30.0–36.0)
MCV: 93.4 fL (ref 80.0–100.0)
Platelets: 354 10*3/uL (ref 150–400)
RBC: 5.03 MIL/uL (ref 4.22–5.81)
RDW: 15.5 % (ref 11.5–15.5)
WBC: 8.1 10*3/uL (ref 4.0–10.5)
nRBC: 0 % (ref 0.0–0.2)

## 2023-06-28 LAB — SALICYLATE LEVEL: Salicylate Lvl: <7 mg/dL — ABNORMAL LOW (ref 7.0–30.0)

## 2023-06-28 LAB — CBG MONITORING, ED: Glucose-Capillary: 96 mg/dL (ref 70–99)

## 2023-06-28 LAB — COMPREHENSIVE METABOLIC PANEL
ALT: 15 U/L (ref 0–44)
AST: 21 U/L (ref 15–41)
Albumin: 3.9 g/dL (ref 3.5–5.0)
Alkaline Phosphatase: 53 U/L (ref 38–126)
Anion gap: 10 (ref 5–15)
BUN: 8 mg/dL (ref 6–20)
CO2: 22 mmol/L (ref 22–32)
Calcium: 9.2 mg/dL (ref 8.9–10.3)
Chloride: 110 mmol/L (ref 98–111)
Creatinine, Ser: 0.81 mg/dL (ref 0.61–1.24)
GFR, Estimated: >60 mL/min (ref >60)
Glucose, Bld: 98 mg/dL (ref 70–99)
Potassium: 3.4 mmol/L — ABNORMAL LOW (ref 3.5–5.1)
Sodium: 142 mmol/L (ref 135–145)
Total Bilirubin: 0.6 mg/dL (ref <1.2)
Total Protein: 7.4 g/dL (ref 6.5–8.1)

## 2023-06-28 LAB — ACETAMINOPHEN LEVEL: Acetaminophen (Tylenol), Serum: <10 ug/mL — ABNORMAL LOW (ref 10–30)

## 2023-06-28 LAB — ETHANOL: Alcohol, Ethyl (B): 227 mg/dL — ABNORMAL HIGH (ref <10)

## 2023-06-28 MED ORDER — LORAZEPAM 2 MG/ML IJ SOLN: 0.0000 mg | Freq: Four times a day (QID) | INTRAMUSCULAR | Status: DC

## 2023-06-28 MED ORDER — ACETAMINOPHEN 325 MG PO TABS
650.0000 mg | ORAL_TABLET | ORAL | Status: DC | PRN
  Administered 2023-06-28 – 2023-07-02 (×12): 650 mg via ORAL
  Filled 2023-06-28 (×12): qty 2

## 2023-06-28 MED ORDER — RANOLAZINE ER 500 MG PO TB12
500.0000 mg | ORAL_TABLET | Freq: Two times a day (BID) | ORAL | Status: DC
  Administered 2023-06-28 (×2): 500 mg via ORAL
  Filled 2023-06-28 (×2): qty 1

## 2023-06-28 MED ORDER — NICOTINE 21 MG/24HR TD PT24
MEDICATED_PATCH | TRANSDERMAL | Status: AC
  Filled 2023-06-28: qty 1

## 2023-06-28 MED ORDER — ATORVASTATIN CALCIUM 80 MG PO TABS
80.0000 mg | ORAL_TABLET | Freq: Every day | ORAL | Status: DC
  Administered 2023-06-29 – 2023-07-03 (×5): 80 mg via ORAL
  Filled 2023-06-28 (×6): qty 1

## 2023-06-28 MED ORDER — GABAPENTIN 300 MG PO CAPS
300.0000 mg | ORAL_CAPSULE | Freq: Every day | ORAL | Status: DC
  Administered 2023-06-29 – 2023-06-30 (×2): 300 mg via ORAL
  Filled 2023-06-28 (×5): qty 1

## 2023-06-28 MED ORDER — METOPROLOL TARTRATE 25 MG PO TABS
25.0000 mg | ORAL_TABLET | Freq: Two times a day (BID) | ORAL | Status: DC
  Administered 2023-06-28 – 2023-07-03 (×10): 25 mg via ORAL
  Filled 2023-06-28 (×13): qty 1

## 2023-06-28 MED ORDER — ZIPRASIDONE MESYLATE 20 MG IM SOLR
20.0000 mg | Freq: Once | INTRAMUSCULAR | Status: AC
  Administered 2023-06-28: 20 mg via INTRAMUSCULAR
  Filled 2023-06-28: qty 20

## 2023-06-28 MED ORDER — THIAMINE HCL 100 MG/ML IJ SOLN: 100.0000 mg | Freq: Every day | INTRAMUSCULAR | Status: DC

## 2023-06-28 MED ORDER — VITAMIN B-1 100 MG PO TABS
100.0000 mg | ORAL_TABLET | Freq: Every day | ORAL | Status: DC
  Administered 2023-06-29: 100 mg via ORAL
  Filled 2023-06-28 (×2): qty 1

## 2023-06-28 MED ORDER — LORAZEPAM 2 MG/ML IJ SOLN: 0.0000 mg | Freq: Two times a day (BID) | INTRAMUSCULAR | Status: DC

## 2023-06-28 MED ORDER — LORAZEPAM 1 MG PO TABS: 0.0000 mg | ORAL_TABLET | Freq: Two times a day (BID) | ORAL | Status: DC

## 2023-06-28 MED ORDER — ISOSORBIDE MONONITRATE ER 60 MG PO TB24
60.0000 mg | ORAL_TABLET | Freq: Every day | ORAL | Status: DC
  Administered 2023-06-29 – 2023-07-03 (×5): 60 mg via ORAL
  Filled 2023-06-28 (×6): qty 1

## 2023-06-28 MED ORDER — BACLOFEN 10 MG PO TABS
10.0000 mg | ORAL_TABLET | Freq: Three times a day (TID) | ORAL | Status: DC
  Administered 2023-06-28 – 2023-07-03 (×14): 10 mg via ORAL
  Filled 2023-06-28 (×18): qty 1

## 2023-06-28 MED ORDER — CLOPIDOGREL BISULFATE 75 MG PO TABS
75.0000 mg | ORAL_TABLET | Freq: Every day | ORAL | Status: DC
  Administered 2023-06-29 – 2023-07-03 (×5): 75 mg via ORAL
  Filled 2023-06-28 (×6): qty 1

## 2023-06-28 MED ORDER — FENOFIBRATE 54 MG PO TABS
54.0000 mg | ORAL_TABLET | Freq: Every day | ORAL | Status: DC
Start: 2023-06-28 — End: 2023-06-28
  Administered 2023-06-28: 54 mg via ORAL
  Filled 2023-06-28: qty 1

## 2023-06-28 MED ORDER — NICOTINE 21 MG/24HR TD PT24: 21.0000 mg | MEDICATED_PATCH | Freq: Every day | TRANSDERMAL | Status: DC

## 2023-06-28 MED ORDER — MAGNESIUM HYDROXIDE 400 MG/5ML PO SUSP: 30.0000 mL | Freq: Every day | ORAL | Status: DC | PRN

## 2023-06-28 MED ORDER — OLANZAPINE 10 MG IM SOLR: 10.0000 mg | Freq: Three times a day (TID) | INTRAMUSCULAR | Status: DC | PRN

## 2023-06-28 MED ORDER — GABAPENTIN 300 MG PO CAPS: 600.0000 mg | ORAL_CAPSULE | Freq: Every day | ORAL | Status: DC

## 2023-06-28 MED ORDER — OXYCODONE-ACETAMINOPHEN 5-325 MG PO TABS: 2.0000 | ORAL_TABLET | Freq: Three times a day (TID) | ORAL | Status: DC | PRN

## 2023-06-28 MED ORDER — NICOTINE 21 MG/24HR TD PT24
21.0000 mg | MEDICATED_PATCH | Freq: Every day | TRANSDERMAL | Status: DC
  Filled 2023-06-28: qty 1

## 2023-06-28 MED ORDER — ISOSORBIDE MONONITRATE ER 30 MG PO TB24
60.0000 mg | ORAL_TABLET | Freq: Every day | ORAL | Status: DC
  Administered 2023-06-28: 60 mg via ORAL
  Filled 2023-06-28: qty 2

## 2023-06-28 MED ORDER — GABAPENTIN 300 MG PO CAPS
300.0000 mg | ORAL_CAPSULE | Freq: Every day | ORAL | Status: DC
  Administered 2023-06-28: 300 mg via ORAL
  Filled 2023-06-28: qty 1

## 2023-06-28 MED ORDER — OLANZAPINE 5 MG PO TBDP: 5.0000 mg | ORAL_TABLET | Freq: Three times a day (TID) | ORAL | Status: DC | PRN

## 2023-06-28 MED ORDER — DULOXETINE HCL 60 MG PO CPEP
60.0000 mg | ORAL_CAPSULE | Freq: Every day | ORAL | Status: DC
  Administered 2023-06-29 – 2023-06-30 (×2): 60 mg via ORAL
  Filled 2023-06-28 (×4): qty 1

## 2023-06-28 MED ORDER — RANOLAZINE ER 500 MG PO TB12
500.0000 mg | ORAL_TABLET | Freq: Two times a day (BID) | ORAL | Status: DC
  Administered 2023-06-28 – 2023-07-03 (×10): 500 mg via ORAL
  Filled 2023-06-28 (×13): qty 1

## 2023-06-28 MED ORDER — ALUM & MAG HYDROXIDE-SIMETH 200-200-20 MG/5ML PO SUSP
30.0000 mL | ORAL | Status: DC | PRN
  Administered 2023-06-29 – 2023-07-02 (×5): 30 mL via ORAL
  Filled 2023-06-28 (×5): qty 30

## 2023-06-28 MED ORDER — ONDANSETRON HCL 4 MG PO TABS
4.0000 mg | ORAL_TABLET | Freq: Three times a day (TID) | ORAL | Status: DC | PRN
  Administered 2023-06-28 – 2023-07-03 (×8): 4 mg via ORAL
  Filled 2023-06-28 (×8): qty 1

## 2023-06-28 MED ORDER — DULOXETINE HCL 30 MG PO CPEP
60.0000 mg | ORAL_CAPSULE | Freq: Every day | ORAL | Status: DC
  Administered 2023-06-28: 60 mg via ORAL
  Filled 2023-06-28: qty 2

## 2023-06-28 MED ORDER — INFLUENZA VIRUS VACC SPLIT PF (FLUZONE) 0.5 ML IM SUSY
0.5000 mL | PREFILLED_SYRINGE | INTRAMUSCULAR | Status: AC
  Administered 2023-06-29: 0.5 mL via INTRAMUSCULAR
  Filled 2023-06-28: qty 0.5

## 2023-06-28 MED ORDER — ACETAMINOPHEN 325 MG PO TABS: 650.0000 mg | ORAL_TABLET | ORAL | Status: DC | PRN

## 2023-06-28 MED ORDER — LORAZEPAM 1 MG PO TABS
0.0000 mg | ORAL_TABLET | Freq: Four times a day (QID) | ORAL | Status: DC
  Administered 2023-06-28 (×2): 1 mg via ORAL
  Filled 2023-06-28: qty 2
  Filled 2023-06-28: qty 1

## 2023-06-28 MED ORDER — ATORVASTATIN CALCIUM 40 MG PO TABS
80.0000 mg | ORAL_TABLET | Freq: Every day | ORAL | Status: DC
  Administered 2023-06-28: 80 mg via ORAL
  Filled 2023-06-28: qty 2

## 2023-06-28 MED ORDER — CLOPIDOGREL BISULFATE 75 MG PO TABS
75.0000 mg | ORAL_TABLET | Freq: Every day | ORAL | Status: DC
  Administered 2023-06-28: 75 mg via ORAL
  Filled 2023-06-28: qty 1

## 2023-06-28 MED ORDER — FENOFIBRATE 54 MG PO TABS
54.0000 mg | ORAL_TABLET | Freq: Every day | ORAL | Status: DC
  Administered 2023-06-29 – 2023-07-03 (×5): 54 mg via ORAL
  Filled 2023-06-28 (×6): qty 1

## 2023-06-28 MED ORDER — LOSARTAN POTASSIUM 50 MG PO TABS
100.0000 mg | ORAL_TABLET | Freq: Every day | ORAL | Status: DC
  Administered 2023-06-29 – 2023-07-03 (×5): 100 mg via ORAL
  Filled 2023-06-28 (×6): qty 2

## 2023-06-28 MED ORDER — LOSARTAN POTASSIUM 50 MG PO TABS: 100.0000 mg | ORAL_TABLET | Freq: Every day | ORAL | Status: DC

## 2023-06-28 MED ORDER — OLANZAPINE 10 MG IM SOLR: 5.0000 mg | Freq: Three times a day (TID) | INTRAMUSCULAR | Status: DC | PRN

## 2023-06-28 MED ORDER — STERILE WATER FOR INJECTION IJ SOLN
INTRAMUSCULAR | Status: AC
  Administered 2023-06-28: 1.2 mL
  Filled 2023-06-28: qty 10

## 2023-06-28 MED ORDER — METOPROLOL TARTRATE 25 MG PO TABS
25.0000 mg | ORAL_TABLET | Freq: Two times a day (BID) | ORAL | Status: DC
  Administered 2023-06-28 (×2): 25 mg via ORAL
  Filled 2023-06-28 (×2): qty 1

## 2023-06-28 MED ORDER — NICOTINE 21 MG/24HR TD PT24
21.0000 mg | MEDICATED_PATCH | Freq: Every day | TRANSDERMAL | Status: DC
  Administered 2023-06-28 – 2023-07-03 (×6): 21 mg via TRANSDERMAL
  Filled 2023-06-28 (×6): qty 1

## 2023-06-28 MED ORDER — LOSARTAN POTASSIUM 50 MG PO TABS
100.0000 mg | ORAL_TABLET | Freq: Every day | ORAL | Status: DC
  Administered 2023-06-28: 100 mg via ORAL
  Filled 2023-06-28: qty 2

## 2023-06-28 MED ORDER — LORAZEPAM 1 MG PO TABS
0.0000 mg | ORAL_TABLET | Freq: Four times a day (QID) | ORAL | Status: DC
  Administered 2023-06-28 – 2023-06-29 (×3): 1 mg via ORAL
  Filled 2023-06-28 (×3): qty 1

## 2023-06-28 MED ORDER — OXYCODONE-ACETAMINOPHEN 5-325 MG PO TABS
2.0000 | ORAL_TABLET | Freq: Three times a day (TID) | ORAL | Status: DC | PRN
  Administered 2023-06-28 – 2023-07-03 (×9): 2 via ORAL
  Filled 2023-06-28 (×9): qty 2

## 2023-06-28 MED ORDER — GABAPENTIN 300 MG PO CAPS
600.0000 mg | ORAL_CAPSULE | Freq: Every day | ORAL | Status: DC
  Administered 2023-06-28 – 2023-07-02 (×5): 600 mg via ORAL
  Filled 2023-06-28 (×8): qty 2

## 2023-06-28 MED ORDER — BACLOFEN 10 MG PO TABS
10.0000 mg | ORAL_TABLET | Freq: Three times a day (TID) | ORAL | Status: DC
  Administered 2023-06-28: 10 mg via ORAL
  Filled 2023-06-28: qty 1

## 2023-06-28 MED ORDER — THIAMINE MONONITRATE 100 MG PO TABS
100.0000 mg | ORAL_TABLET | Freq: Every day | ORAL | Status: DC
  Administered 2023-06-28: 100 mg via ORAL
  Filled 2023-06-28: qty 1

## 2023-06-28 NOTE — Progress Notes (Signed)
Pt has been accepted tom [facility] on 12/09/204 Bed 303-2 assignment:   Pt meets inpatient criteria per: Vernard Gambles NP  Attending Physician will be: Phineas Inches, MD   Report can be called to: Adult unit: [phone]  Pt can arrive after Pre-admit   Care Team Notified: [facility] ACDanika Victory Dakin RN, Debbe Odea NP, Roddie Mc RN, Rozell Searing RN, Iseley Pulliam RN       [place] Errika Narvaiz LCSW-A   06/28/2023 10:17 AM

## 2023-06-28 NOTE — ED Notes (Signed)
Pt tearful on arrival to hallway bed.   Dressed out in hospital provided wine colored scrubs.   Has a homemade ribbon necklace that remains in place. Says for religious reasons can not be removed. Pt continues to say I would never hang myself anyway's my plan is to blow my brains out.   Repeatedly apologetic for crying. Unwilling to specify needs but says he wants to seek help before he does something dumb again.

## 2023-06-28 NOTE — Consult Note (Addendum)
BH ED ASSESSMENT   Reason for Consult:  SI Referring Physician:  Pollina Patient Identification: [name] MRN:  [id number] ED Chief Complaint: MDD (major depressive disorder), recurrent episode, moderate (HCC)  Diagnosis:  Principal Problem:   MDD (major depressive disorder), recurrent episode, moderate (HCC) Active Problems:   Alcohol abuse   ED Assessment Time Calculation: Start Time: 0900 Stop Time: 0940 Total Time in Minutes (Assessment Completion): 40   HPI:   [name] is a 47 y.o. male patient with concerns of depression. Patient reports that he does have a history, was hospitalized many years ago. Patient reports that he does not know what has gone wrong, but he "needs help". Patient suicidal.   Subjective:   Pt seen at [facility] for face to face psychiatric evaluation.  Patient presented last night with BAL of 227 and UDS positive for marijuana.  While intoxicated patient did appear disorganized, making threatening suicidal statements, verbally aggressive, and did attempt to elope from the ED.  Patient was placed under involuntary commitment.  Today patient is calm and pleasant, engages willingly in conversation. He does mention feeling suicidal over the past few weeks, he does mention having life stressors but states "I just can't talk about those right now." He does mention a history of depression, he received IP treatment many years ago. He has not been seeing a therapist or a psychiatrist regularly.   He does endorse passive suicidal ideations. Denies any specific intent or plan but states "it probably wouldn't be hard to come up with one." He denies HI. Denies AVH. He does admit to drinking more frequently recently, has been drinking almost daily, and multiple beers per day. Pt states he is afraid to discharge home and would like to seek mental health treatment. Pt is agreeable wit inpatient psychiatric treatment recommendation.   Pt has been accepted to [facility]. Pt  will transfer today.   Past Psychiatric History:  MDD, alcohol use  Risk to Self or Others: Is the patient at risk to self? Yes Has the patient been a risk to self in the past 6 months? No Has the patient been a risk to self within the distant past? Yes Is the patient a risk to others? No Has the patient been a risk to others in the past 6 months? No Has the patient been a risk to others within the distant past? No  [place] Scale:  Flowsheet Row ED from 06/27/2023 in [facility] Emergency Department at [facility] ED from 05/13/2022 in [facility] Emergency Department at [facility]  C-SSRS RISK CATEGORY High Risk No Risk        Substance Abuse:   alcohol use, marijuana use  Past Medical History:  Past Medical History:  Diagnosis Date   Anxiety    Arthritis    CHF (congestive heart failure) (HCC)    Complication of anesthesia    difficulty awakening following appendectomy    Dizziness    with humidity    History of laparoscopic appendectomy 09/18/2013   Hx of CABG    Hyperlipidemia    Hypertension    Kidney stones    MYOCARDIAL INFARCTION 10/13/2008   Qualifier: Diagnosis of  By: Juanda Chance, MD, Johny Chess    Numbness in right leg    pt relates to sciatica   Right kidney stone 08/06/2014   Noted incidentally on CT January 2016; ~0.6 cm, non-obstructing    Tobacco user     Past Surgical History:  Procedure Laterality Date   APPENDECTOMY     CARDIAC CATHETERIZATION     CORONARY ARTERY BYPASS GRAFT     status post diaphragmatic wall infarction Rx BMS RCA 03-11-08    CORONARY STENT PLACEMENT     CYSTOSCOPY WITH RETROGRADE PYELOGRAM, URETEROSCOPY AND STENT PLACEMENT Bilateral 03/16/2015   Procedure: CYSTOSCOPY WITH BILATERAL RETROGRADE PYELOGRAM, RIGHT URETEROSCOPY, STONE BASKETRY WITH LASER LITHOTRIPSY ON THE RIGHT,  AND BILATERAL STENT PLACEMENT;  Surgeon: Malen Gauze, MD;  Location: [facility] ORS;  Service: Urology;  Laterality: Bilateral;    CYSTOSCOPY WITH RETROGRADE PYELOGRAM, URETEROSCOPY AND STENT PLACEMENT Left 04/02/2015   Procedure: CYSTOSCOPY WITH RETROGRADE PYELOGRAM, URETEROSCOPY REMOVAL BILATERAL STENTS;  Surgeon: Malen Gauze, MD;  Location: [facility];  Service: Urology;  Laterality: Left;   HOLMIUM LASER APPLICATION Right 03/16/2015   Procedure: HOLMIUM LASER APPLICATION;  Surgeon: Malen Gauze, MD;  Location: [facility];  Service: Urology;  Laterality: Right;   LAPAROSCOPIC APPENDECTOMY N/A 09/17/2013   Procedure: APPENDECTOMY LAPAROSCOPIC;  Surgeon: Shelly Rubenstein, MD;  Location: [facility] OR;  Service: General;  Laterality: N/A;   LEFT HEART CATHETERIZATION WITH CORONARY/GRAFT ANGIOGRAM N/A 10/19/2011   Procedure: LEFT HEART CATHETERIZATION WITH Isabel Caprice;  Surgeon: Herby Abraham, MD;  Location: [facility] CATH LAB;  Service: Cardiovascular;  Laterality: N/A;   ORTHOPEDIC SURGERY     TONSILLECTOMY     Family History:  Family History  Adopted: Yes  Problem Relation Age of Onset   Cancer Mother    Diabetes Mother    Hyperlipidemia Mother    Hypertension Mother    Heart failure Mother        Died age 56   Cancer Sister    Diabetes Sister    Hyperlipidemia Sister    Hypertension Sister    Family Psychiatric  History: unknown Social History:  Social History   Substance and Sexual Activity  Alcohol Use Yes     Social History   Substance and Sexual Activity  Drug Use Yes   Types: Marijuana    Social History   Socioeconomic History   Marital status: Divorced    Spouse name: Not on file   Number of children: 4   Years of education: Not on file   Highest education level: Not on file  Occupational History   Not on file  Tobacco Use   Smoking status: Every Day    Current packs/day: 0.50    Average packs/day: 0.5 packs/day for 30.0 years (15.0 ttl pk-yrs)    Types: Cigarettes   Smokeless tobacco: Former    Types: Snuff    Quit date: 07/20/1994   Tobacco comments:    5-6 cigs/day  Vaping  Use   Vaping status: Never Used  Substance and Sexual Activity   Alcohol use: Yes   Drug use: Yes    Types: Marijuana   Sexual activity: Not Currently  Other Topics Concern   Not on file  Social History Narrative   Lives with girlfriend and two children.  He has four children.     Social Determinants of Health   Financial Resource Strain: Not on file  Food Insecurity: Not on file  Transportation Needs: Not on file  Physical Activity: Not on file  Stress: Not on file  Social Connections: Not on file   Additional Social History:    Allergies:   Allergies  Allergen Reactions   Celexa [Citalopram] Itching and Rash   Codeine Itching    Labs:  Results for orders placed  or performed during the hospital encounter of 06/27/23 (from the past 48 hour(s))  CBG monitoring, ED     Status: None   Collection Time: 06/28/23 12:22 AM  Result Value Ref Range   Glucose-Capillary 96 70 - 99 mg/dL    Comment: Glucose reference range applies only to samples taken after fasting for at least 8 hours.   Comment 1 Notify RN    Comment 2 Document in Chart   Rapid urine drug screen (hospital performed)     Status: Abnormal   Collection Time: 06/28/23 12:24 AM  Result Value Ref Range   Opiates NONE DETECTED NONE DETECTED   Cocaine NONE DETECTED NONE DETECTED   Benzodiazepines NONE DETECTED NONE DETECTED   Amphetamines NONE DETECTED NONE DETECTED   Tetrahydrocannabinol POSITIVE (A) NONE DETECTED   Barbiturates NONE DETECTED NONE DETECTED    Comment: (NOTE) DRUG SCREEN FOR MEDICAL PURPOSES ONLY.  IF CONFIRMATION IS NEEDED FOR ANY PURPOSE, NOTIFY LAB WITHIN 5 DAYS.  LOWEST DETECTABLE LIMITS FOR URINE DRUG SCREEN Drug Class                     Cutoff (ng/mL) Amphetamine and metabolites    1000 Barbiturate and metabolites    200 Benzodiazepine                 200 Opiates and metabolites        300 Cocaine and metabolites        300 THC                            50 Performed at [facility] Lab, [address] [place], [place] [address]   Comprehensive metabolic panel     Status: Abnormal   Collection Time: 06/28/23 12:33 AM  Result Value Ref Range   Sodium 142 135 - 145 mmol/L   Potassium 3.4 (L) 3.5 - 5.1 mmol/L   Chloride 110 98 - 111 mmol/L   CO2 22 22 - 32 mmol/L   Glucose, Bld 98 70 - 99 mg/dL    Comment: Glucose reference range applies only to samples taken after fasting for at least 8 hours.   BUN 8 6 - 20 mg/dL   Creatinine, Ser 1.91 0.61 - 1.24 mg/dL   Calcium 9.2 8.9 - 47.8 mg/dL   Total Protein 7.4 6.5 - 8.1 g/dL   Albumin 3.9 3.5 - 5.0 g/dL   AST 21 15 - 41 U/L   ALT 15 0 - 44 U/L   Alkaline Phosphatase 53 38 - 126 U/L   Total Bilirubin 0.6 <1.2 mg/dL   GFR, Estimated >29 >56 mL/min    Comment: (NOTE) Calculated using the CKD-EPI Creatinine Equation (2021)    Anion gap 10 5 - 15    Comment: Performed at [facility], [address] [place], [place] [address]  Ethanol     Status: Abnormal   Collection Time: 06/28/23 12:33 AM  Result Value Ref Range   Alcohol, Ethyl (B) 227 (H) <10 mg/dL    Comment: (NOTE) Lowest detectable limit for serum alcohol is 10 mg/dL.  For medical purposes only. Performed at [facility], [address] [place], [place] [address]   Salicylate level     Status: Abnormal   Collection Time: 06/28/23 12:33 AM  Result Value Ref Range   Salicylate Lvl <7.0 (L) 7.0 - 30.0 mg/dL    Comment: Performed at [facility], [address] [place], [place] [address]  Acetaminophen level     Status: Abnormal   Collection Time: 06/28/23 12:33 AM  Result Value Ref Range   Acetaminophen (Tylenol), Serum <10 (L) 10 - 30 ug/mL    Comment: (NOTE) Therapeutic concentrations vary significantly. A range of 10-30 ug/mL  may be an effective concentration for many patients. However, some  are best treated at concentrations outside of this range. Acetaminophen concentrations >150 ug/mL at 4 hours after ingestion   and >50 ug/mL at 12 hours after ingestion are often associated with  toxic reactions.  Performed at [facility], [address] [place], [place] [address]   cbc     Status: None   Collection Time: 06/28/23 12:33 AM  Result Value Ref Range   WBC 8.1 4.0 - 10.5 K/uL   RBC 5.03 4.22 - 5.81 MIL/uL   Hemoglobin 15.6 13.0 - 17.0 g/dL   HCT 54.2 70.6 - 23.7 %   MCV 93.4 80.0 - 100.0 fL   MCH 31.0 26.0 - 34.0 pg   MCHC 33.2 30.0 - 36.0 g/dL   RDW 62.8 31.5 - 17.6 %   Platelets 354 150 - 400 K/uL   nRBC 0.0 0.0 - 0.2 %    Comment: Performed at [facility] Lab, [address] [place], [place] [address]    Current Facility-Administered Medications  Medication Dose Route Frequency Provider Last Rate Last Admin   acetaminophen (TYLENOL) tablet 650 mg  650 mg Oral Q4H PRN Pollina, Canary Brim, MD       atorvastatin (LIPITOR) tablet 80 mg  80 mg Oral Daily Gilda Crease, MD   80 mg at 06/28/23 0959   baclofen (LIORESAL) tablet 10 mg  10 mg Oral TID Gilda Crease, MD   10 mg at 06/28/23 1000   clopidogrel (PLAVIX) tablet 75 mg  75 mg Oral Daily Gilda Crease, MD   75 mg at 06/28/23 1000   DULoxetine (CYMBALTA) DR capsule 60 mg  60 mg Oral Daily Gilda Crease, MD   60 mg at 06/28/23 7106   fenofibrate tablet 54 mg  54 mg Oral Daily Gilda Crease, MD   54 mg at 06/28/23 1000   gabapentin (NEURONTIN) capsule 300 mg  300 mg Oral Q0600 Gilda Crease, MD   300 mg at 06/28/23 1006   gabapentin (NEURONTIN) capsule 600 mg  600 mg Oral QHS Pollina, Canary Brim, MD       isosorbide mononitrate (IMDUR) 24 hr tablet 60 mg  60 mg Oral Daily Pollina, Canary Brim, MD   60 mg at 06/28/23 1000   LORazepam (ATIVAN) injection 0-4 mg  0-4 mg Intravenous Q6H Pollina, Canary Brim, MD       Or   LORazepam (ATIVAN) tablet 0-4 mg  0-4 mg Oral Q6H Pollina, Canary Brim, MD   1 mg at 06/28/23 0248   [START ON 06/30/2023] LORazepam (ATIVAN)  injection 0-4 mg  0-4 mg Intravenous Q12H Gilda Crease, MD       Or   Melene Muller ON 06/30/2023] LORazepam (ATIVAN) tablet 0-4 mg  0-4 mg Oral Q12H Pollina, Canary Brim, MD       losartan (COZAAR) tablet 100 mg  100 mg Oral Daily Gilda Crease, MD   100 mg at 06/28/23 1000   metoprolol tartrate (LOPRESSOR) tablet 25 mg  25 mg Oral BID Gilda Crease, MD   25 mg at 06/28/23 1000  nicotine (NICODERM CQ - dosed in mg/24 hours) patch 21 mg  21 mg Transdermal Daily Pollina, Canary Brim, MD       oxyCODONE-acetaminophen (PERCOCET/ROXICET) 5-325 MG per tablet 2 tablet  2 tablet Oral Q8H PRN Pollina, Canary Brim, MD       ranolazine (RANEXA) 12 hr tablet 500 mg  500 mg Oral BID Gilda Crease, MD   500 mg at 06/28/23 1027   thiamine (VITAMIN B1) tablet 100 mg  100 mg Oral Daily Gilda Crease, MD   100 mg at 06/28/23 1000   Or   thiamine (VITAMIN B1) injection 100 mg  100 mg Intravenous Daily Pollina, Canary Brim, MD       Current Outpatient Medications  Medication Sig Dispense Refill   atorvastatin (LIPITOR) 80 MG tablet TAKE 1 TABLET BY MOUTH ONCE DAILY AT 6 PM (Patient not taking: Reported on 06/28/2023) 90 tablet 2   baclofen (LIORESAL) 10 MG tablet TAKE 1 TABLET THREE TIMES DAILY 270 tablet 0   clopidogrel (PLAVIX) 75 MG tablet TAKE 1 TABLET BY MOUTH ONCE DAILY IN THE MORNING 90 tablet 2   DULoxetine (CYMBALTA) 60 MG capsule Take 1 capsule (60 mg total) by mouth daily. (Patient not taking: Reported on 06/28/2023) 30 capsule 3   fenofibrate (TRICOR) 48 MG tablet Take 1 tablet (48 mg total) by mouth daily. (Patient not taking: Reported on 06/28/2023) 90 tablet 2   gabapentin (NEURONTIN) 300 MG capsule TAKE 1 CAPSULE BY MOUTH IN THE MORNING, TAKE 1 CAPSULE BY MOUTH IN THE EVENING AND TAKE 2 CAPSULES BY MOUTH AT BEDTIME (Patient not taking: Reported on 06/28/2023) 360 capsule 2   icosapent Ethyl (VASCEPA) 1 g capsule TAKE 2 CAPSULES BY MOUTH TWICE DAILY WITH  MEALS (Patient not taking: Reported on 06/28/2023) 360 capsule 1   isosorbide mononitrate (IMDUR) 60 MG 24 hr tablet Take 1 tablet (60 mg total) by mouth daily. (Patient not taking: Reported on 06/28/2023) 90 tablet 2   losartan (COZAAR) 100 MG tablet TAKE 1 TABLET (100 MG TOTAL) BY MOUTH DAILY. (Patient not taking: Reported on 06/28/2023) 90 tablet 2   metoprolol tartrate (LOPRESSOR) 25 MG tablet Take 1 tablet (25 mg total) by mouth 2 (two) times daily. (Patient not taking: Reported on 06/28/2023) 180 tablet 2   Multiple Vitamin (MULTI-VITAMIN DAILY PO) Take 1 Dose by mouth daily. [employer] vitamin pack takes 1 packet by mouth daily (Patient not taking: Reported on 06/28/2023)     naloxone (NARCAN) nasal spray 4 mg/0.1 mL Spray 1 squirt in each nostril for signs of opioid overdose including unresponsiveness and decreased breathing 1 each 0   nitroGLYCERIN (NITROSTAT) 0.4 MG SL tablet Place 1 tablet (0.4 mg total) under the tongue every 5 (five) minutes as needed for chest pain. 25 tablet 6   nystatin cream (MYCOSTATIN) Apply to affected area 2 times daily 15 g 0   oxyCODONE-acetaminophen (PERCOCET) 10-325 MG tablet Take 1 tablet by mouth every 8 (eight) hours as needed.     ranolazine (RANEXA) 500 MG 12 hr tablet Take 1 tablet (500 mg total) by mouth 2 (two) times daily. (Patient not taking: Reported on 06/28/2023) 180 tablet 2   Vitamin D, Ergocalciferol, (DRISDOL) 1.25 MG (50000 UNIT) CAPS capsule Take 50,000 Units by mouth once a week. (Patient not taking: Reported on 06/28/2023)      Musculoskeletal: Strength & Muscle Tone: within normal limits Gait & Station: normal Patient leans: N/A   Psychiatric Specialty Exam: Presentation  General Appearance:  Appropriate for Environment  Eye Contact: Good  Speech: Clear and Coherent  Speech Volume: Normal  Handedness:No data recorded  Mood and Affect  Mood: Depressed; Hopeless  Affect: Congruent   Thought Process  Thought  Processes: Coherent  Descriptions of Associations:Intact  Orientation:Full (Time, Place and Person)  Thought Content:WDL  History of Schizophrenia/Schizoaffective disorder:No data recorded Duration of Psychotic Symptoms:No data recorded Hallucinations:Hallucinations: None  Ideas of Reference:None  Suicidal Thoughts:Suicidal Thoughts: Yes, Passive SI Passive Intent and/or Plan: Without Intent; Without Plan  Homicidal Thoughts:Homicidal Thoughts: No   Sensorium  Memory: Immediate Fair; Recent Fair  Judgment: Fair  Insight: Fair   Art therapist  Concentration: Fair  Attention Span: Fair  Recall: Fair  Fund of Knowledge: Good  Language: Good   Psychomotor Activity  Psychomotor Activity: Psychomotor Activity: Normal   Assets  Assets: Desire for Improvement; Physical Health; Leisure Time; Communication Skills    Sleep  Sleep: Sleep: Fair   Physical Exam: Physical Exam Neurological:     Mental Status: He is alert and oriented to person, place, and time.  Psychiatric:        Attention and Perception: Attention normal.        Mood and Affect: Mood is depressed.        Speech: Speech normal.        Behavior: Behavior is cooperative.        Thought Content: Thought content includes suicidal ideation.    Review of Systems  Psychiatric/Behavioral:  Positive for depression, substance abuse and suicidal ideas.   All other systems reviewed and are negative.  Blood pressure 126/87, pulse 91, temperature 97.7 F (36.5 C), temperature source Oral, resp. rate 18, height 5\' 10"  (1.778 m), weight 108.9 kg, SpO2 98%. Body mass index is 34.45 kg/m.  Medical Decision Making: Recommend inpatient psychiatric treatment. Pt is currently voluntary, and agreeable with plan. Continue home medications.     Disposition:  transfer to [facility] for IP treatment  Eligha Bridegroom, NP 06/28/2023 10:18 AM

## 2023-06-28 NOTE — ED Notes (Signed)
Pt was d/c with v/s within the hr but transferring him out of the system is held up while getting the order needed to transfer his IVC.

## 2023-06-28 NOTE — ED Notes (Signed)
Pts IVC papers, belongings & all other required paperwork has been given to [facility] upon their arrival to transport him to [facility].

## 2023-06-28 NOTE — ED Notes (Signed)
Pt belongings placed in locker 1, security called to wand the patient. All personal belongings have been inventoried.

## 2023-06-28 NOTE — ED Triage Notes (Signed)
The pt wants some help with life he reports that he is si and hi  he says he is leaving then decides to stay crying something about his daddy dying he reports that stopped taking all his meds months ago including his diabetic meds

## 2023-06-28 NOTE — ED Notes (Signed)
Pt sister/POA called for a update, number in the chart.

## 2023-06-28 NOTE — BH Assessment (Signed)
TTS clinician attempted to complete assessment. Per Dorene Sorrow, RN, patient received ativan and geodon, currently asleep. TTS consult to be completed at later time.

## 2023-06-28 NOTE — ED Notes (Signed)
The pt reports that he had his last alcohol  about 4 hours ago

## 2023-06-28 NOTE — ED Provider Notes (Signed)
46 year old male who presented to the emergency department with suicidal ideations.  Was seen by psychiatry who recommended that transport be arranged for him to go to [facility].  He was under IVC and was verbally aggressive and walking about the emergency department.  Was accepted to [facility] by Dr. Sherron Flemings.  Appears that patient was transported to [facility].  Unfortunately, I was not notified prior to his discharge and did not get to evaluate the patient but do feel that this is the correct disposition for the patient.  Dispo tab updated.   Rondel Baton, MD 06/28/23 [phone]

## 2023-06-28 NOTE — ED Notes (Addendum)
Envelope #[id number] has been submitted successfully  CASE NUMBER WRITTEN IN BECAUSE MAGISTRATE LEFT IT OFF FINAL PAPERWORK [id number]

## 2023-06-28 NOTE — ED Notes (Addendum)
Pt verbally agressive and upset that he is in the hallway. Pt elopes throughout the ED ambulance entrance and makes it to the road. Encouraged back to ED by security / [employer].   NO safety sitter present. Consulted with department lead for sitter.

## 2023-06-28 NOTE — [facility] Group Notes (Signed)
[facility] Group Notes:  (Nursing/MHT/Case Management/Adjunct)  Date:  06/28/2023  Time: 2000  Type of Therapy:   Alcoholics Anonymous Meeting  Participation Level:  Active  Participation Quality:  Appropriate and Attentive  Affect:  Depressed  Cognitive:  Alert  Insight:  Improving  Engagement in Group:  Developing/Improving  Modes of Intervention:  Clarification, Education, and Support  Summary of Progress/Problems:  [name] 06/28/2023, 9:24 PM

## 2023-06-28 NOTE — Plan of Care (Signed)
Nurse discussed coping skills with patient.  

## 2023-06-28 NOTE — ED Notes (Signed)
Attempted TTS evaluation. Pt too sleepy to communicate effectively. Marland Kitchen

## 2023-06-28 NOTE — Progress Notes (Signed)
Pt admitted IVC to [facility] inpatient adult unit.  Pt stated he remembers being drunk but nothing more.  He stated he would need to speak to his son to find out what happened.  He denies knowledge of why he was admitted.  Pt denied SI, HI.  Positive for AVH.  Pt states he sees people dressed in "old timey clothes".  He reports hearing voices telling he at times he is doing good/great and at other times he is not good.  Pt reported his father passed ~ 2 months ago and he is currently sleeping on the couch at his step-mother's home.  Pt oriented to unit.  Pt safe on unit.

## 2023-06-28 NOTE — ED Notes (Signed)
Pt again, verbally aggressive and elopes ED through ambulance bay. Encouraged to return to room with no physical contact made.   Pt alert, oriented and ambulatory. MD consulted for IM medication assistance.   Pt explained that he is IVC.

## 2023-06-28 NOTE — ED Provider Notes (Signed)
[facility] [facility]ERGENC[facility] DEPARTMENT AT [facility] Provider Note   CSN: [id number] Arrival date & time: 06/27/23  2343     History  Chief Complaint  Patient presents with   Psychiatric Evaluation    [name] is a 47 y.o. male.  Patient presents to the emergency department with concerns of depression.  Patient reports that he does have a history, was hospitalized many years ago.  Patient reports that he does not know what has gone wrong, but he "needs help".  Patient suicidal.       Home Medications Prior to Admission medications   Medication Sig Start Date End Date Taking? Authorizing Provider  atorvastatin (LIPITOR) 80 MG tablet TAKE 1 TABLET BY MOUTH ONCE DAILY AT 6 PM 07/31/20   Donita Brooks, MD  baclofen (LIORESAL) 10 MG tablet TAKE 1 TABLET THREE TIMES DAILY 11/15/19   Arvilla Market, MD  clopidogrel (PLAVIX) 75 MG tablet TAKE 1 TABLET BY MOUTH ONCE DAILY IN THE MORNING 07/31/20   Donita Brooks, MD  clotrimazole (LOTRIMIN) 1 % cream Apply to affected area 2 times daily 11/03/19   Bethel Born, PA-C  DULoxetine (CYMBALTA) 60 MG capsule Take 1 capsule (60 mg total) by mouth daily. 07/20/19   Hoy Register, MD  fenofibrate (TRICOR) 48 MG tablet Take 1 tablet (48 mg total) by mouth daily. 06/20/19   Donita Brooks, MD  gabapentin (NEURONTIN) 300 MG capsule TAKE 1 CAPSULE BY MOUTH IN THE MORNING, TAKE 1 CAPSULE BY MOUTH IN THE EVENING AND TAKE 2 CAPSULES BY MOUTH AT BEDTIME 06/20/19   Donita Brooks, MD  icosapent Ethyl (VASCEPA) 1 g capsule TAKE 2 CAPSULES BY MOUTH TWICE DAILY WITH MEALS 12/12/19   Arvilla Market, MD  isosorbide mononitrate (IMDUR) 60 MG 24 hr tablet Take 1 tablet (60 mg total) by mouth daily. 06/20/19   Donita Brooks, MD  losartan (COZAAR) 100 MG tablet TAKE 1 TABLET (100 MG TOTAL) BY MOUTH DAILY. 07/31/20   Donita Brooks, MD  metoprolol tartrate (LOPRESSOR) 25 MG tablet Take 1 tablet (25 mg total) by  mouth 2 (two) times daily. 06/20/19   Donita Brooks, MD  Multiple Vitamin (MULTI-VITAMIN DAILY PO) Take 1 Dose by mouth daily. [employer] vitamin pack takes 1 packet by mouth daily    [provider]  naloxone [facility]) nasal spray 4 mg/0.1 mL Spray 1 squirt in each nostril for signs of opioid overdose including unresponsiveness and decreased breathing 05/13/22   Elayne Snare K, DO  nitroGLYCERIN (NITROSTAT) 0.4 MG SL tablet Place 1 tablet (0.4 mg total) under the tongue every 5 (five) minutes as needed for chest pain. Patient not taking: Reported on 08/24/2019 07/20/19   Hoy Register, MD  nystatin cream (MYCOSTATIN) Apply to affected area 2 times daily 02/06/20   Bill Salinas, PA-C  oxyCODONE-acetaminophen (PERCOCET) 10-325 MG tablet Take 1 tablet by mouth every 8 (eight) hours as needed. 12/22/19   [provider]  ranolazine (RANEXA) 500 MG 12 hr tablet Take 1 tablet (500 mg total) by mouth 2 (two) times daily. 06/20/19   Donita Brooks, MD  Vitamin D, Ergocalciferol, (DRISDOL) 1.25 MG (50000 UNIT) CAPS capsule Take 50,000 Units by mouth once a week. 01/08/20   [provider]      Allergies    Celexa [citalopram] and Codeine    Review of Systems   Review of Systems  Physical Exam Updated Vital Signs BP (!) 177/106 (BP Location:  Right Arm)   Pulse (!) 104   Temp (!) 97.5 F (36.4 C) (Oral)   Resp 20   Ht 5\' 10"  (1.778 m)   Wt 108.9 kg   SpO2 98%   BMI 34.45 kg/m  Physical Exam Vitals and nursing note reviewed.  Constitutional:      General: He is not in acute distress.    Appearance: He is well-developed.  HENT:     Head: Normocephalic and atraumatic.     Mouth/Throat:     Mouth: Mucous membranes are moist.  Eyes:     General: Vision grossly intact. Gaze aligned appropriately.     Extraocular Movements: Extraocular movements intact.     Conjunctiva/sclera: Conjunctivae normal.  Cardiovascular:     Rate and Rhythm: Normal rate and regular  rhythm.     Pulses: Normal pulses.     Heart sounds: Normal heart sounds, S1 normal and S2 normal. No murmur heard.    No friction rub. No gallop.  Pulmonary:     Effort: Pulmonary effort is normal. No respiratory distress.     Breath sounds: Normal breath sounds.  Abdominal:     Palpations: Abdomen is soft.     Tenderness: There is no abdominal tenderness. There is no guarding or rebound.     Hernia: No hernia is present.  Musculoskeletal:        General: No swelling.     Cervical back: Full passive range of motion without pain, normal range of motion and neck supple. No pain with movement, spinous process tenderness or muscular tenderness. Normal range of motion.     Right lower leg: No edema.     Left lower leg: No edema.  Skin:    General: Skin is warm and dry.     Capillary Refill: Capillary refill takes less than 2 seconds.     Findings: No ecchymosis, erythema, lesion or wound.  Neurological:     Mental Status: He is alert and oriented to person, place, and time.     GCS: GCS eye subscore is 4. GCS verbal subscore is 5. GCS motor subscore is 6.     Cranial Nerves: Cranial nerves 2-12 are intact.     Sensory: Sensation is intact.     Motor: Motor function is intact. No weakness or abnormal muscle tone.     Coordination: Coordination is intact.  Psychiatric:        Mood and Affect: Mood is depressed. Affect is tearful.        Speech: Speech normal.        Behavior: Behavior normal.        Thought Content: Thought content includes suicidal ideation.     ED Results / Procedures / Treatments   Labs (all labs ordered are listed, but only abnormal results are displayed) Labs Reviewed  COMPREHENSIVE METABOLIC PANEL - Abnormal; Notable for the following components:      Result Value   Potassium 3.4 (*)    All other components within normal limits  ETHANOL - Abnormal; Notable for the following components:   Alcohol, Ethyl (B) 227 (*)    All other components within normal  limits  SALICYLATE LEVEL - Abnormal; Notable for the following components:   Salicylate Lvl <7.0 (*)    All other components within normal limits  ACETAMINOPHEN LEVEL - Abnormal; Notable for the following components:   Acetaminophen (Tylenol), Serum <10 (*)    All other components within normal limits  CBC  RAPID URINE DRUG  SCREEN, HOSP PERFORMED  CBG MONITORING, ED    EKG None  Radiology No results found.  Procedures Procedures    Medications Ordered in ED Medications - No data to display  ED Course/ Medical Decision Making/ A&P                                 Medical Decision Making Amount and/or Complexity of Data Reviewed Labs: ordered.   Patient with history of depression, presents with worsening depression, suicidal ideation.  Patient will require behavioral health evaluation for possible placement.  Medically clear for treatment.        Final Clinical Impression(s) / ED Diagnoses Final diagnoses:  Suicidal ideation    Rx / DC Orders ED Discharge Orders     None         Nora Rooke, Canary Brim, MD 06/28/23 [phone]

## 2023-06-29 DIAGNOSIS — F1220 Cannabis dependence, uncomplicated: Secondary | ICD-10-CM | POA: Diagnosis present

## 2023-06-29 DIAGNOSIS — F1020 Alcohol dependence, uncomplicated: Secondary | ICD-10-CM | POA: Diagnosis present

## 2023-06-29 DIAGNOSIS — F333 Major depressive disorder, recurrent, severe with psychotic symptoms: Secondary | ICD-10-CM | POA: Diagnosis not present

## 2023-06-29 MED ORDER — HYDROXYZINE HCL 25 MG PO TABS
25.0000 mg | ORAL_TABLET | Freq: Four times a day (QID) | ORAL | Status: AC | PRN
Start: 2023-06-29 — End: 2023-07-02
  Administered 2023-06-29 – 2023-07-01 (×3): 25 mg via ORAL
  Filled 2023-06-29 (×4): qty 1

## 2023-06-29 MED ORDER — LORAZEPAM 1 MG PO TABS
1.0000 mg | ORAL_TABLET | Freq: Two times a day (BID) | ORAL | Status: AC
  Administered 2023-07-01 – 2023-07-02 (×2): 1 mg via ORAL
  Filled 2023-06-29 (×2): qty 1

## 2023-06-29 MED ORDER — LORAZEPAM 2 MG/ML IJ SOLN
2.0000 mg | Freq: Once | INTRAMUSCULAR | Status: AC
  Administered 2023-06-29: 2 mg via INTRAMUSCULAR
  Filled 2023-06-29: qty 1

## 2023-06-29 MED ORDER — LORAZEPAM 1 MG PO TABS
1.0000 mg | ORAL_TABLET | Freq: Four times a day (QID) | ORAL | Status: AC
  Administered 2023-06-29 – 2023-06-30 (×3): 1 mg via ORAL
  Filled 2023-06-29 (×2): qty 1

## 2023-06-29 MED ORDER — LORAZEPAM 1 MG PO TABS
1.0000 mg | ORAL_TABLET | Freq: Every day | ORAL | Status: AC
  Administered 2023-07-03: 1 mg via ORAL
  Filled 2023-06-29: qty 1

## 2023-06-29 MED ORDER — LOPERAMIDE HCL 2 MG PO CAPS: 2.0000 mg | ORAL_CAPSULE | ORAL | Status: AC | PRN

## 2023-06-29 MED ORDER — LORAZEPAM 1 MG PO TABS
1.0000 mg | ORAL_TABLET | Freq: Three times a day (TID) | ORAL | Status: AC
  Administered 2023-06-30 – 2023-07-01 (×3): 1 mg via ORAL
  Filled 2023-06-29 (×3): qty 1

## 2023-06-29 MED ORDER — VALACYCLOVIR HCL 500 MG PO TABS
1000.0000 mg | ORAL_TABLET | Freq: Two times a day (BID) | ORAL | Status: DC
  Administered 2023-06-29 – 2023-07-03 (×9): 1000 mg via ORAL
  Filled 2023-06-29 (×11): qty 2

## 2023-06-29 MED ORDER — ADULT MULTIVITAMIN W/MINERALS CH
1.0000 | ORAL_TABLET | Freq: Every day | ORAL | Status: DC
  Administered 2023-06-29 – 2023-07-03 (×5): 1 via ORAL
  Filled 2023-06-29 (×7): qty 1

## 2023-06-29 MED ORDER — LORAZEPAM 1 MG PO TABS
1.0000 mg | ORAL_TABLET | Freq: Four times a day (QID) | ORAL | Status: AC | PRN
  Filled 2023-06-29: qty 1

## 2023-06-29 MED ORDER — THIAMINE HCL 100 MG/ML IJ SOLN
100.0000 mg | Freq: Once | INTRAMUSCULAR | Status: AC
  Administered 2023-06-29: 100 mg via INTRAMUSCULAR
  Filled 2023-06-29: qty 2

## 2023-06-29 MED ORDER — VITAMIN B-1 100 MG PO TABS
100.0000 mg | ORAL_TABLET | Freq: Every day | ORAL | Status: DC
  Administered 2023-06-30 – 2023-07-03 (×4): 100 mg via ORAL
  Filled 2023-06-29 (×5): qty 1

## 2023-06-29 NOTE — Plan of Care (Signed)
  Problem: Education: Goal: Emotional status will improve Outcome: Not Progressing Goal: Mental status will improve Outcome: Not Progressing   

## 2023-06-29 NOTE — Progress Notes (Signed)
Pt reported pain, discomfort, and swelling on right hand. Right hand does have a bruise. Pt verbalized that he does not remember how he got the bruise and does not recall if he had a fall prior to admission. Assessed pt's hand, pt was able to move fingers and capillary refill was less than 3 seconds. Provided pt with cold pack and PRN pain medication.    06/28/23 2030  Psych Admission Type (Psych Patients Only)  Admission Status Involuntary  Psychosocial Assessment  Patient Complaints Anxiety;Worrying  Eye Contact Fair  Facial Expression Flat  Affect Anxious;Depressed  Speech Logical/coherent  Interaction Assertive;Childlike  Motor Activity Fidgety  Appearance/Hygiene In scrubs  Behavior Characteristics Cooperative  Mood Depressed;Anxious  Thought Process  Coherency WDL  Content WDL  Delusions None reported or observed  Perception WDL  Hallucination None reported or observed  Judgment Limited  Confusion Mild  Danger to Self  Current suicidal ideation? Denies  Agreement Not to Harm Self Yes  Description of Agreement verbal  Danger to Others  Danger to Others None reported or observed

## 2023-06-29 NOTE — Group Note (Signed)
Date:  06/29/2023 Time:  4:37 AM  Group Topic/Focus:  Wrap-Up Group:   The focus of this group is to help patients review their daily goal of treatment and discuss progress on daily workbooks.    Participation Level:  Did Not Attend  Participation Quality:   N/A.  Affect:   N/A  Cognitive:   N/A  Insight: None  Engagement in Group:   N/A  Modes of Intervention:   N/A  Additional Comments:  Patient did not attend wrap up group.  Kennieth Francois 06/29/2023, 4:37 AM
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
## 2023-06-29 NOTE — Plan of Care (Signed)
  Problem: Activity: Goal: Interest or engagement in activities will improve Outcome: Progressing   Problem: Safety: Goal: Periods of time without injury will increase Outcome: Progressing   Problem: Activity: Goal: Sleeping patterns will improve Outcome: Not Progressing

## 2023-06-29 NOTE — Group Note (Signed)
Recreation Therapy Group Note   Group Topic:Animal Assisted Therapy   Group Date: 06/29/2023 Start Time: 0950 End Time: 1030 Facilitators: Kanita Delage-McCall, LRT,CTRS Location: [address] Dayroom   Animal-Assisted Activity (AAA) Program Checklist/Progress Notes Patient Eligibility Criteria Checklist & Daily Group note for Rec Tx Intervention  AAA/T Program Assumption of Risk Form signed by Patient/ or Parent Legal Guardian Yes  Patient understands his/her participation is voluntary Yes  Education: Charity fundraiser, Appropriate Animal Interaction   Education Outcome: Acknowledges education.    Affect/Mood: N/A   Participation Level: Did not attend    Clinical Observations/Individualized Feedback:     Plan: Continue to engage patient in RT group sessions 2-3x/week.   [name], LRT,CTRS 06/29/2023 12:44 PM

## 2023-06-29 NOTE — Progress Notes (Signed)
   06/29/23 0600  15 Minute Checks  Location Bedroom  Visual Appearance Calm  Behavior Composed  Sleep (Behavioral Health Patients Only)  Calculate sleep? (Click Yes once per 24 hr at 0600 safety check) Yes  Documented sleep last 24 hours 5

## 2023-06-29 NOTE — H&P (Signed)
Psychiatric Admission Assessment Adult  Patient Identification: [name] MRN:  [id number] Date of Evaluation:  06/29/2023 Chief Complaint:  MDD (major depressive disorder) [F32.9] Principal Diagnosis: MDD (major depressive disorder), recurrent, severe, with psychosis (HCC) Diagnosis:  Principal Problem:   MDD (major depressive disorder), recurrent, severe, with psychosis (HCC) Active Problems:   Hyperlipidemia, mixed   TOBACCO USE   CAD - CABG '09, OM3 stent March 2010, last cath 4/13- medical Rx   HTN (hypertension)   Anxiety   Sciatica of right side   Dental caries   Degenerative disc disease at L5-S1 level   Prediabetes   Severe alcohol dependence (HCC)   Severe tetrahydrocannabinol (THC) dependence (HCC)  CC:"I made a remark that I'm dying any way, I might as well just speed it up. There is a million ways I could do it, but I don't. My son took the guns and put them up. I think about it, but I don't have the heart to do  it."  HPI: The patient is a 47 year old Caucasian male with prior mental health diagnoses of MDD & GAD who initially presented to the [facility] ER on 12/8 with complaints of SI & HI, asking for help before he does something "dumb", & stated: "my plan is to blow my brains out" as per ER documentation. While in the ER, pt was verbally aggressive, eloped and made it through the ambulance bay to the road prior to being brought back into the ER. BAL was 227, Tox screen positive for THC. He was IVC'd  & transferred to [facility] on 12/9 for treatment and stabilization of his mental status.  Assessment & ROS: During encounter with patient, he minimizes his symptoms of depression, makes statements as above, states that he "was just talking to be talking." He however appears with flushed skin, he has difficulty sitting still & frequently changes spots, he complains of the light being too bright, states that he is craving alcohol, reports being fearful that his body is going  to go into withdrawal if he does not get an alcoholic drink, states that he has been drinking alcohol since he was 47 yrs old, states that he does not remember being without it for more than a few days, states that he drinks half a gallon of whiskey daily, states that on halloween, he actually drank two gallons of whiskey. He denies blackouts, denies any history of seizures related to alcohol. Pt verbalizes feeling very anxious, observed to have fine tremors to b/l upper extremities.   He shares that prior to the presentation at the [facility] [place], he thought that he was having a heart attack, and his son and his GF convinced him to get in the car so that they can take him to the ER for a work up.  He reports that it turns out that he was just having a panic attack.  He reports that he is too fearful to harm himself, states that he made statements stating that he is dying, and that he might as well speed it up, but that he had no intent or plan to harm himself.  He however states that he has guns at home, but that his son took them and locked them up.  Patient reports depressive symptoms which have been persistent, and ongoing for at least the past few months, worsening in the weeks leading to this hospitalization.  He reports insomnia, anhedonia, feelings of guilt, decreased energy levels, poor concentration, decreased appetite levels, psychomotor retardation,  poor motivation, as well as feelings of hopelessness, helplessness, worthlessness, which worsened in the days leading to hospitalization.  He reports feeling guilty about his alcohol use, especially drinking while driving, and his fears of hurting other people while intoxicated.  He shares that he had DUIs in the past, states that the last 1 was at least 15 years ago, but states that all of them have been cleared off, and that he no longer drinks and drives.  He also reports guilt regarding "everything", then states by recounting the birth of 2 of his  children 1 day apart by 2 different women, at 2 different hospitals, which send his life in a downward spiral.    Patient recounts risk taking behaviors, talks about having a "threesome" with a friend and the a partner during which he sustained a herpes Simplex infection, states that he is currently having a flare, and asking for treatment. He also reports feeling guilty regarding this.  Other than the drinking and driving, and having unprotected sex, he denies other risk-taking behaviors.  Denies other manic type symptoms.  Patient reports anxiety symptoms, such as excessive worry, feeling restless, feeling on edge, easily fatigued, muscle tension, insomnia, poor concentration levels, which have been ongoing and persistent for at least the past 6 months.  He denies social phobia, denies having special phobias.  Denies OCD type symptoms.  Denies eating disorders.  Denies symptoms significant for borderline personality disorders or other personality disorders.  Denies self-injurious behaviors. Patient reports a history of being bullied while in middle school, but denies physical or sexual abuse in the past or recently.  He denies that the bullying has impacted him in any way, and denies PTSD type symptoms.    He reports panic attacks which occur at least once per week, states that they consist of chest tightness, a choking sensation, rapid heartbeat, chills, fear of dying, trembling, and fear that he is going crazy.  He reports psychosis, states that he has had auditory hallucinations since childhood, states he hears "a bunch of junk", which he elaborates further as "mumbling and whispers", which happens all the time and are persistent.  He reports occasional visual hallucinations consisting of "figures and outlines."  He denies any history of paranoia, and denies first rank symptoms in the past or recently.  Pt with flat affect and depressed mood, attention to personal hygiene and grooming is fair, eye  contact is good, speech is clear & coherent. Thought contents are organized and logical, and pt currently denies SI/HI/AVH or paranoia. There is no evidence of delusional thoughts.  He states that the last time that he had auditory hallucinations was earlier today morning.  Mode of transport to Hospital: [facility] Current Outpatient (Home) Medication List: Not taking any medications at this time for mental health reasons.  Patient reports that he takes Lipitor, baclofen, Plavix, gabapentin, Imdur, losartan, Cozaar, metoprolol, Ranexa which are all prescribed by his primary care provider.  States that he has not followed up with his cardiologist for a while now, education provided on the need to follow up with his cardiologist after discharge, patient verbalizes understanding.  POA/Legal Guardian: Patient is his own guardian  Past Psychiatric Hx: Previous Psych Diagnoses: MDD Prior inpatient treatment: As per chart review patient was admitted to this hospital on 03/31/2007.  At that time he was brought in by his girlfriend requesting detox for alcohol use because he was binge drinking. Current/prior outpatient treatment: None Prior rehab hx: Denies ever been to rehab,  states that he is not interested in going this time Psychotherapy hx: None History of suicide attempts: Denies History of homicide or aggression: Denies Psychiatric medication history: Reports being on duloxetine in the past, states it caused stomach upset, and he stopped taking it. Psychiatric medication compliance history: Noncompliance Neuromodulation history: None Current Psychiatrist: None at this time Current therapist: None at this time  Substance Abuse Hx: Alcohol: EtOH every day since age 20 years old, currently drinks at least have a gallon of whiskey daily.  Last drink was a few hours prior to presenting to the ED Tobacco: Daily more than a pack a day Illicit drugs: THC "as much as I can get".  He states that he uses it to  help with his stomach pain, and for back pain.  Reports using cocaine, acid, and mushrooms in his teenage years, but none currently. Rx drug abuse: Denies Rehab hx: Denies  Past Medical History: Medical Diagnoses: Hypertension, history of MI, Home Rx: Multiple cardiac meds as listed above. Prior Hosp: For medical reasons as per patient Prior Surgeries/Trauma: As per chart review patient had appendectomy in 2015, has had a CABG, has had a cystoscopy plasty with retrograde pyelogram, ureteroscopy removal of b/l stents in 2016.  Head trauma, LOC, concussions, seizures: Denies seizures, but has had head trauma and concussions while playing high school football. Allergies: Codeine, and Celexa cause rash LMP: N/A Contraception: Denies use ZOX:WRUEA, Joselyn Glassman, NP PCP - General, Nurse Practitioner Kathleene Hazel, MD PCP - Cardiology, Cardiology  Family History: Medical: Cardiac problems in the family, including hypertension, and CAD.  Also reports a history of cancers in his family. Psych: MDD, GAD, schizophrenia in grandmother, whom he states was seeing a psychiatrist and taking medications for mental health reasons until her death.  Reports that her sister was IVC'd, but he is unsure what mental health conditions she has.  Unsure what medications she takes. Psych Rx: Unsure SA/HA: Yes as per patient.  Reports that paternal uncle committed suicide via heroin overdose, and that he had to help remove him from where he was, denies that this bothered him.  He adds that at the time it bothered him but he has since learned to live with it.  He shares that he was 47 years old at the time. Substance use family hx: Multiple as per patient, does not elaborate.  Social History: Patient reports that he was born and raised in [place], [place].  He has 4 children, he states that they are 43, 21, 28, and 47 years old.  He shares that he is divorced, resides with his son who is 52 years old.  Son is  supportive.  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  Sexual orientation is heterosexual. Peer Group: None Housing: Owns his own home Finances: Not a stressor as per patient Legal: No legal issues at this time, has clear the DUIs. Military: No  Associated Signs/Symptoms: Depression Symptoms:  depressed mood, anhedonia, insomnia, psychomotor retardation, fatigue, feelings of worthlessness/guilt, difficulty concentrating, hopelessness, recurrent thoughts of death, suicidal thoughts with specific plan, anxiety, panic attacks, loss of energy/fatigue, disturbed sleep, decreased appetite, (Hypo) Manic Symptoms:  Distractibility, Impulsivity, Anxiety Symptoms:  Excessive Worry, Panic Symptoms, Psychotic Symptoms:  Hallucinations: Auditory PTSD Symptoms: NA Total Time spent with patient: 1.5 hours Is the patient at risk to self? Yes.    Has the patient been a risk to self in the past 6 months? Yes.  Has the patient been a risk to self within the distant past? Yes.    Is the patient a risk to others? Yes.    Has the patient been a risk to others in the past 6 months? Yes.    Has the patient been a risk to others within the distant past? Yes.     The long history of irresponsible use of alcohol renders patient at the risk to self and others, because he has a history of drinking and driving and could potentially harm himself or other people.  [place] Scale:  Flowsheet Row Ad[facility] (Current) [facility] from [facility]27/2023 in [facility] Department at [facility] ED from 05/13/2022 in [facility] Emergency Department at [facility]  C-SSRS RISK CATEGORY No Risk High Risk No Risk      Alcohol Screening: 1. How often do you have a drink containing alcohol?: 4 or more times a week 2. How many drinks containing alcohol do you have on a  typical day when you are drinking?: 10 or more 3. How often do you have six or more drinks on one occasion?: Daily or almost daily AUDIT-C Score: 12 4. How often during the last year have you found that you were not able to stop drinking once you had started?: Weekly 5. How often during the last year have you failed to do what was normally expected from you because of drinking?: Never 6. How often during the last year have you needed a first drink in the morning to get yourself going after a heavy drinking session?: Monthly 7. How often during the last year have you had a feeling of guilt of remorse after drinking?: Less than monthly 8. How often during the last year have you been unable to remember what happened the night before because you had been drinking?: Daily or almost daily 9. Have you or someone else been injured as a result of your drinking?: No 10. Has a relative or friend or a doctor or another health worker been concerned about your drinking or suggested you cut down?: Yes, but not in the last year Alcohol Use Disorder Identification Test Final Score (AUDIT): 24 Alcohol Brief Interventions/Follow-up: Alcohol education/Brief advice Substance Abuse History in the last 12 months:  Yes.   Consequences of Substance Abuse: Withdrawal Symptoms:   Cramps Diaphoresis Diarrhea Headaches Tremors Previous Psychotropic Medications: Yes  Psychological Evaluations: No  Past Medical History:  Past Medical History:  Diagnosis Date   Anxiety    Arthritis    CHF (congestive heart failure) (HCC)    Complication of anesthesia    difficulty awakening following appendectomy    Dizziness    with humidity    History of laparoscopic appendectomy 09/18/2013   Hx of CABG    Hyperlipidemia    Hypertension    Kidney stones    MYOCARDIAL INFARCTION 10/13/2008   Qualifier: Diagnosis of  By: Juanda Chance, MD, Johny Chess    Numbness in right leg    pt relates to sciatica   Right kidney stone  08/06/2014   Noted incidentally on CT January 2016; ~0.6 cm, non-obstructing    Tobacco user     Past Surgical History:  Procedure Laterality Date   APPENDECTOMY     CARDIAC CATHETERIZATION     CORONARY ARTERY BYPASS GRAFT     status post diaphragmatic wall infarction Rx BMS RCA 03-11-08    CORONARY STENT PLACEMENT  CYSTOSCOPY WITH RETROGRADE PYELOGRAM, URETEROSCOPY AND STENT PLACEMENT Bilateral 03/16/2015   Procedure: CYSTOSCOPY WITH BILATERAL RETROGRADE PYELOGRAM, RIGHT URETEROSCOPY, STONE BASKETRY WITH LASER LITHOTRIPSY ON THE RIGHT,  AND BILATERAL STENT PLACEMENT;  Surgeon: Malen Gauze, MD;  Location: [facility];  Service: Urology;  Laterality: Bilateral;   CYSTOSCOPY WITH RETROGRADE PYELOGRAM, URETEROSCOPY AND STENT PLACEMENT Left 04/02/2015   Procedure: CYSTOSCOPY WITH RETROGRADE PYELOGRAM, URETEROSCOPY REMOVAL BILATERAL STENTS;  Surgeon: Malen Gauze, MD;  Location: [facility];  Service: Urology;  Laterality: Left;   HOLMIUM LASER APPLICATION Right 03/16/2015   Procedure: HOLMIUM LASER APPLICATION;  Surgeon: Malen Gauze, MD;  Location: [facility];  Service: Urology;  Laterality: Right;   LAPAROSCOPIC APPENDECTOMY N/A 09/17/2013   Procedure: APPENDECTOMY LAPAROSCOPIC;  Surgeon: Shelly Rubenstein, MD;  Location: [facility] OR;  Service: General;  Laterality: N/A;   LEFT HEART CATHETERIZATION WITH CORONARY/GRAFT ANGIOGRAM N/A 10/19/2011   Procedure: LEFT HEART CATHETERIZATION WITH Isabel Caprice;  Surgeon: Herby Abraham, MD;  Location: [facility] CATH LAB;  Service: Cardiovascular;  Laterality: N/A;   ORTHOPEDIC SURGERY     TONSILLECTOMY     Family History:  Family History  Adopted: Yes  Problem Relation Age of Onset   Cancer Mother    Diabetes Mother    Hyperlipidemia Mother    Hypertension Mother    Heart failure Mother        Died age 47   Cancer Sister    Diabetes Sister    Hyperlipidemia Sister    Hypertension Sister    Family Psychiatric  History: Yes- See above   Tobacco Screening:  Social History   Tobacco Use  Smoking Status Every Day   Current packs/day: 0.50   Average packs/day: 0.5 packs/day for 35.0 years (17.5 ttl pk-yrs)   Types: Cigarettes  Smokeless Tobacco Former   Types: Snuff   Quit date: 07/20/1994  Tobacco Comments   5-6 cigs/day    BH Tobacco Counseling     Are you interested in Tobacco Cessation Medications?  Yes, implement Nicotene Replacement Protocol Counseled patient on smoking cessation:  Yes Reason Tobacco Screening Not Completed: No value filed.       Social History:  Social History   Substance and Sexual Activity  Alcohol Use Yes     Social History   Substance and Sexual Activity  Drug Use Yes   Types: Marijuana    Additional Social History: Marital status: Divorced Divorced, when?: "divorced over 15 years" What types of issues is patient dealing with in the relationship?: "just regular and bills create tension" Does patient have children?: Yes How many children?: 4 How is patient's relationship with their children?: "i haven't talked to my daughter since she was 1and my baby boy i talk to him often, one of my sons live with me we get along well, my other son i speak with him often not to bad."     Allergies:   Allergies  Allergen Reactions   Celexa [Citalopram] Itching and Rash   Codeine Itching   Lab Results:  Results for orders placed or performed during the hospital encounter of 06/27/23 (from the past 48 hour(s))  CBG monitoring, ED     Status: None   Collection Time: 06/28/23 12:22 AM  Result Value Ref Range   Glucose-Capillary 96 70 - 99 mg/dL    Comment: Glucose reference range applies only to samples taken after fasting for at least 8 hours.   Comment 1 Notify RN    Comment 2 Document  in Chart   Rapid urine drug screen (hospital performed)     Status: Abnormal   Collection Time: 06/28/23 12:24 AM  Result Value Ref Range   Opiates NONE DETECTED NONE DETECTED   Cocaine NONE DETECTED  NONE DETECTED   Benzodiazepines NONE DETECTED NONE DETECTED   Amphetamines NONE DETECTED NONE DETECTED   Tetrahydrocannabinol POSITIVE (A) NONE DETECTED   Barbiturates NONE DETECTED NONE DETECTED    Comment: (NOTE) DRUG SCREEN FOR MEDICAL PURPOSES ONLY.  IF CONFIRMATION IS NEEDED FOR ANY PURPOSE, NOTIFY LAB WITHIN 5 DAYS.  LOWEST DETECTABLE LIMITS FOR URINE DRUG SCREEN Drug Class                     Cutoff (ng/mL) Amphetamine and metabolites    1000 Barbiturate and metabolites    200 Benzodiazepine                 200 Opiates and metabolites        300 Cocaine and metabolites        300 THC                            50 Performed at [facility] Lab, [address], [place], [place] [address]   Comprehensive metabolic panel     Status: Abnormal   Collection Time: 06/28/23 12:33 AM  Result Value Ref Range   Sodium 142 135 - 145 mmol/L   Potassium 3.4 (L) 3.5 - 5.1 mmol/L   Chloride 110 98 - 111 mmol/L   CO2 22 22 - 32 mmol/L   Glucose, Bld 98 70 - 99 mg/dL    Comment: Glucose reference range applies only to samples taken after fasting for at least 8 hours.   BUN 8 6 - 20 mg/dL   Creatinine, Ser 6.04 0.61 - 1.24 mg/dL   Calcium 9.2 8.9 - 54.0 mg/dL   Total Protein 7.4 6.5 - 8.1 g/dL   Albumin 3.9 3.5 - 5.0 g/dL   AST 21 15 - 41 U/L   ALT 15 0 - 44 U/L   Alkaline Phosphatase 53 38 - 126 U/L   Total Bilirubin 0.6 <1.2 mg/dL   GFR, Estimated >98 >11 mL/min    Comment: (NOTE) Calculated using the CKD-EPI Creatinine Equation (2021)    Anion gap 10 5 - 15    Comment: Performed at [facility] Lab, [address], [place], [place] [address]  Ethanol     Status: Abnormal   Collection Time: 06/28/23 12:33 AM  Result Value Ref Range   Alcohol, Ethyl (B) 227 (H) <10 mg/dL    Comment: (NOTE) Lowest detectable limit for serum alcohol is 10 mg/dL.  For medical purposes only. Performed at [facility], [address], [place], [place] [address]   Salicylate level      Status: Abnormal   Collection Time: 06/28/23 12:33 AM  Result Value Ref Range   Salicylate Lvl <7.0 (L) 7.0 - 30.0 mg/dL    Comment: Performed at [facility] Lab, [address] [place], [place] [address]  Acetaminophen level     Status: Abnormal   Collection Time: 06/28/23 12:33 AM  Result Value Ref Range   Acetaminophen (Tylenol), Serum <10 (L) 10 - 30 ug/mL    Comment: (NOTE) Therapeutic concentrations vary significantly. A range of 10-30 ug/mL  may be an effective concentration for many patients. However, some  are best treated at concentrations  outside of this range. Acetaminophen concentrations >150 ug/mL at 4 hours after ingestion  and >50 ug/mL at 12 hours after ingestion are often associated with  toxic reactions.  Performed at [facility] Lab, [address] [place], [place] [address]   cbc     Status: None   Collection Time: 06/28/23 12:33 AM  Result Value Ref Range   WBC 8.1 4.0 - 10.5 K/uL   RBC 5.03 4.22 - 5.81 MIL/uL   Hemoglobin 15.6 13.0 - 17.0 g/dL   HCT 19.1 47.8 - 29.5 %   MCV 93.4 80.0 - 100.0 fL   MCH 31.0 26.0 - 34.0 pg   MCHC 33.2 30.0 - 36.0 g/dL   RDW 62.1 30.8 - 65.7 %   Platelets 354 150 - 400 K/uL   nRBC 0.0 0.0 - 0.2 %    Comment: Performed at [facility] Lab, [address] [place], [place] [address]    Blood Alcohol level:  Lab Results  Component Value Date   ETH 227 (H) 06/28/2023   ETH (H) 10/26/2007    250        LOWEST DETECTABLE LIMIT FOR SERUM ALCOHOL IS 11 mg/dL FOR MEDICAL PURPOSES ONLY   Metabolic Disorder Labs:  Lab Results  Component Value Date   HGBA1C 6.1 (H) 08/24/2019   MPG 126 07/25/2018   MPG 125.5 05/02/2017   No results found for: "PROLACTIN" Lab Results  Component Value Date   CHOL 130 08/24/2019   TRIG 190 (H) 08/24/2019   HDL 36 (L) 08/24/2019   CHOLHDL 3.6 08/24/2019   VLDL 67 (H) 05/12/2016   LDLCALC 62 08/24/2019   LDLCALC  07/25/2018     Comment:     . LDL cholesterol not  calculated. Triglyceride levels greater than 400 mg/dL invalidate calculated LDL results. . Reference range: <100 . Desirable range <100 mg/dL for primary prevention;   <70 mg/dL for patients with CHD or diabetic patients  with > or = 2 CHD risk factors. Marland Kitchen LDL-C is now calculated using the Martin-Hopkins  calculation, which is a validated novel method providing  better accuracy than the Friedewald equation in the  estimation of LDL-C.  Horald Pollen et al. Lenox Ahr. 2952;841(32): 2061-2068  (http://education.[employer].com/faq/FAQ164)    Current Medications: Current Facility-Administered Medications  Medication Dose Route Frequency Provider Last Rate Last Admin   acetaminophen (TYLENOL) tablet 650 mg  650 mg Oral Q4H PRN Eligha Bridegroom, NP   650 mg at 06/29/23 1304   alum & mag hydroxide-simeth (MAALOX/MYLANTA) 200-200-20 MG/5ML suspension 30 mL  30 mL Oral Q4H PRN Eligha Bridegroom, NP   30 mL at 06/29/23 0923   atorvastatin (LIPITOR) tablet 80 mg  80 mg Oral Daily Eligha Bridegroom, NP   80 mg at 06/29/23 4401   baclofen (LIORESAL) tablet 10 mg  10 mg Oral TID Eligha Bridegroom, NP   10 mg at 06/29/23 1220   clopidogrel (PLAVIX) tablet 75 mg  75 mg Oral Daily Eligha Bridegroom, NP   75 mg at 06/29/23 0810   DULoxetine (CYMBALTA) DR capsule 60 mg  60 mg Oral Daily Eligha Bridegroom, NP   60 mg at 06/29/23 0810   fenofibrate tablet 54 mg  54 mg Oral Daily Eligha Bridegroom, NP   54 mg at 06/29/23 0810   gabapentin (NEURONTIN) capsule 300 mg  300 mg Oral Q0600 Eligha Bridegroom, NP   300 mg at 06/29/23 0272   gabapentin (NEURONTIN) capsule 600 mg  600 mg Oral QHS Eligha Bridegroom,  NP   600 mg at 06/28/23 2139   hydrOXYzine (ATARAX) tablet 25 mg  25 mg Oral Q6H PRN Starleen Blue, NP       isosorbide mononitrate (IMDUR) 24 hr tablet 60 mg  60 mg Oral Daily Eligha Bridegroom, NP   60 mg at 06/29/23 1610   loperamide (IMODIUM) capsule 2-4 mg  2-4 mg Oral PRN Starleen Blue, NP       LORazepam  (ATIVAN) tablet 1 mg  1 mg Oral Q6H PRN Starleen Blue, NP       LORazepam (ATIVAN) tablet 1 mg  1 mg Oral QID Starleen Blue, NP       Followed by   Melene Muller ON 06/30/2023] LORazepam (ATIVAN) tablet 1 mg  1 mg Oral TID Starleen Blue, NP       Followed by   Melene Muller ON 07/01/2023] LORazepam (ATIVAN) tablet 1 mg  1 mg Oral BID Starleen Blue, NP       Followed by   Melene Muller ON 07/03/2023] LORazepam (ATIVAN) tablet 1 mg  1 mg Oral Daily Baker Moronta, NP       losartan (COZAAR) tablet 100 mg  100 mg Oral Daily Massengill, Harrold Donath, MD   100 mg at 06/29/23 9604   magnesium hydroxide (MILK OF MAGNESIA) suspension 30 mL  30 mL Oral Daily PRN Eligha Bridegroom, NP       metoprolol tartrate (LOPRESSOR) tablet 25 mg  25 mg Oral BID Eligha Bridegroom, NP   25 mg at 06/29/23 0810   multivitamin with minerals tablet 1 tablet  1 tablet Oral Daily Starleen Blue, NP   1 tablet at 06/29/23 1211   nicotine (NICODERM CQ - dosed in mg/24 hours) patch 21 mg  21 mg Transdermal Daily Starleen Blue, NP   21 mg at 06/29/23 0813   OLANZapine (ZYPREXA) injection 10 mg  10 mg Intramuscular TID PRN Eligha Bridegroom, NP       OLANZapine (ZYPREXA) injection 5 mg  5 mg Intramuscular TID PRN Eligha Bridegroom, NP       OLANZapine zydis (ZYPREXA) disintegrating tablet 5 mg  5 mg Oral TID PRN Eligha Bridegroom, NP       ondansetron (ZOFRAN) tablet 4 mg  4 mg Oral Q8H PRN Sindy Guadeloupe, NP   4 mg at 06/29/23 5409   oxyCODONE-acetaminophen (PERCOCET/ROXICET) 5-325 MG per tablet 2 tablet  2 tablet Oral Q8H PRN Eligha Bridegroom, NP   2 tablet at 06/29/23 8119   ranolazine (RANEXA) 12 hr tablet 500 mg  500 mg Oral BID Eligha Bridegroom, NP   500 mg at 06/29/23 0811   [START ON 06/30/2023] thiamine (Vitamin B-1) tablet 100 mg  100 mg Oral Daily Starleen Blue, NP       valACYclovir (VALTREX) tablet 1,000 mg  1,000 mg Oral BID Starleen Blue, NP   1,000 mg at 06/29/23 1219   PTA Medications: Medications Prior to Admission  Medication Sig  Dispense Refill Last Dose   atorvastatin (LIPITOR) 80 MG tablet TAKE 1 TABLET BY MOUTH ONCE DAILY AT 6 PM 90 tablet 2 Past Month   baclofen (LIORESAL) 10 MG tablet TAKE 1 TABLET THREE TIMES DAILY 270 tablet 0 Past Month   clopidogrel (PLAVIX) 75 MG tablet TAKE 1 TABLET BY MOUTH ONCE DAILY IN THE MORNING 90 tablet 2 Past Month   DULoxetine (CYMBALTA) 60 MG capsule Take 1 capsule (60 mg total) by mouth daily. 30 capsule 3 Past Month   fenofibrate (TRICOR) 48 MG tablet Take 1 tablet (48 mg  total) by mouth daily. 90 tablet 2 Past Month   gabapentin (NEURONTIN) 300 MG capsule TAKE 1 CAPSULE BY MOUTH IN THE MORNING, TAKE 1 CAPSULE BY MOUTH IN THE EVENING AND TAKE 2 CAPSULES BY MOUTH AT BEDTIME 360 capsule 2 Past Month   isosorbide mononitrate (IMDUR) 60 MG 24 hr tablet Take 1 tablet (60 mg total) by mouth daily. 90 tablet 2 Past Month   losartan (COZAAR) 100 MG tablet TAKE 1 TABLET (100 MG TOTAL) BY MOUTH DAILY. 90 tablet 2 Past Month   metFORMIN (GLUCOPHAGE) 1000 MG tablet Take 500 mg by mouth 2 (two) times daily with a meal.   Past Month   metoprolol tartrate (LOPRESSOR) 25 MG tablet Take 1 tablet (25 mg total) by mouth 2 (two) times daily. 180 tablet 2 Past Month   MOUNJARO 2.5 MG/0.5ML Pen Inject 2.5 mg into the skin once a week.   Past Month   Multiple Vitamin (MULTI-VITAMIN DAILY PO) Take 1 Dose by mouth daily. [employer] vitamin pack takes 1 packet by mouth daily   Past Month   naloxone (NARCAN) nasal spray 4 mg/0.1 mL Spray 1 squirt in each nostril for signs of opioid overdose including unresponsiveness and decreased breathing 1 each 0 Past Month   nitroGLYCERIN (NITROSTAT) 0.4 MG SL tablet Place 1 tablet (0.4 mg total) under the tongue every 5 (five) minutes as needed for chest pain. 25 tablet 6 Past Month   ranolazine (RANEXA) 500 MG 12 hr tablet Take 1 tablet (500 mg total) by mouth 2 (two) times daily. 180 tablet 2 Past Month   oxyCODONE-acetaminophen (PERCOCET) 10-325 MG tablet Take 1 tablet by mouth  every 8 (eight) hours as needed.      Musculoskeletal: Strength & Muscle Tone: within normal limits Gait & Station: normal Patient leans: N/A  Psychiatric Specialty Exam:  Presentation  General Appearance:  Disheveled  Eye Contact: Fair  Speech: Clear and Coherent  Speech Volume: Normal  Handedness:Right   Mood and Affect  Mood: Depressed; Anxious  Affect: Congruent   Thought Process  Thought Processes: Coherent  Duration of Psychotic Symptoms: >2 months  Past Diagnosis of Schizophrenia or Psychoactive disorder: No data recorded Descriptions of Associations:Intact  Orientation:Full (Time, Place and Person)  Thought Content:Logical  Hallucinations:Hallucinations: None  Ideas of Reference:None  Suicidal Thoughts:Suicidal Thoughts: No SI Passive Intent and/or Plan: Without Intent; Without Plan  Homicidal Thoughts:Homicidal Thoughts: No   Sensorium  Memory: Recent Poor  Judgment: Poor  Insight: Poor   Executive Functions  Concentration: Fair  Attention Span: Fair  Recall: Poor  Fund of Knowledge: Poor  Language: Fair   Lexicographer Activity: Psychomotor Activity: Normal   Assets  Assets: Resilience; Housing   Sleep  Sleep: Sleep: Poor    Physical Exam: Physical Exam HENT:     Nose: Nose normal. No congestion.  Eyes:     Pupils: Pupils are equal, round, and reactive to light.  Pulmonary:     Effort: Pulmonary effort is normal.  Musculoskeletal:        General: Normal range of motion.     Cervical back: Normal range of motion.  Neurological:     General: No focal deficit present.     Mental Status: He is alert and oriented to person, place, and time.    Review of Systems  Eyes: Negative.   Respiratory: Negative.    Gastrointestinal: Negative.   Genitourinary: Negative.   Musculoskeletal: Negative.   Neurological: Negative.   Psychiatric/Behavioral:  Positive for  depression,  hallucinations and substance abuse. Negative for memory loss and suicidal ideas. The patient is nervous/anxious and has insomnia.   All other systems reviewed and are negative.  Blood pressure 112/81, pulse 79, temperature 98.6 F (37 C), temperature source Oral, resp. rate 18, height 5\' 10"  (1.778 m), weight 83.8 kg, SpO2 98%. Body mass index is 26.52 kg/m.  Treatment Plan Summary: Daily contact with patient to assess and evaluate symptoms and progress in treatment and Medication management  Safety and Monitoring: Voluntary admission to inpatient psychiatric unit for safety, stabilization and treatment Daily contact with patient to assess and evaluate symptoms and progress in treatment Patient's case to be discussed in multi-disciplinary team meeting Observation Level : q15 minute checks Vital signs: q12 hours Precautions: Safety  Long Term Goal(s): Improvement in symptoms so as ready for discharge  Short Term Goals: Ability to identify changes in lifestyle to reduce recurrence of condition will improve, Ability to verbalize feelings will improve, Ability to disclose and discuss suicidal ideas, Ability to demonstrate self-control will improve, Ability to identify and develop effective coping behaviors will improve, Ability to maintain clinical measurements within normal limits will improve, Compliance with prescribed medications will improve, and Ability to identify triggers associated with substance abuse/mental health issues will improve  Diagnoses Principal Problem:   MDD (major depressive disorder), recurrent, severe, with psychosis (HCC) Active Problems:   Hyperlipidemia, mixed   TOBACCO USE   CAD - CABG '09, OM3 stent March 2010, last cath 4/13- medical Rx   HTN (hypertension)   Anxiety   Sciatica of right side   Dental caries   Degenerative disc disease at L5-S1 level   Prediabetes   Severe alcohol dependence (HCC)   Severe tetrahydrocannabinol (THC) dependence  (HCC)  Medications -Continue Cymbalta 60 mg daily for MDD/GAD/chronic pain -Continue gabapentin 300 mg daily for EtOH use/GAD/neuropathic pain -Continue gabapentin 600 mg nightly for EtOH use/GAD/neuropathic pain -Start Ativan detox taper for alcohol use disorder-See the [facility] for details  Continue the following medical medications -Atorvastatin 80 mg for hyperlipidemia -Fenofibrate 54 mg daily -Plavix 75 mg daily -Lopressor 25 mg twice daily for hypertension -Ranexa 500 mg twice daily -Start Valtrex 1000 mg twice daily for genital herpes infection  PRNS -Continue oxycodone-acetaminophen 5/325 2 tabs as needed every 8 hours for pain (home medication) -Continue agitation protocol medications: Ativan/Benadryl/Zyprexa as needed as per the [facility] -Continue hydroxyzine 25 mg 3 times daily as needed for anxiety -Continue Tylenol 650 mg every 6 hours PRN for mild pain -Continue Maalox 30 mg every 4 hrs PRN for indigestion -Continue Milk of Magnesia as needed every 6 hrs for constipation  Provided with education on all medications including rationales, benefits, and possible side effects, agreeable to trials of all medications.  Labs reviewed: Ordered lipid panel, hemoglobin A1c, TSH, vitamin D, B12, repeat BMP to check potassium level since he was 3.4 yesterday.  QTc from 2 days ago is 444.  Discharge Planning: Social work and case management to assist with discharge planning and identification of hospital follow-up needs prior to discharge Estimated LOS: 5-7 days Discharge Concerns: Need to establish a safety plan; Medication compliance and effectiveness Discharge Goals: Return home with outpatient referrals for mental health follow-up including medication management/psychotherapy  I certify that inpatient services furnished can reasonably be expected to improve the patient's condition.    Starleen Blue, NP 12/10/20241:17 PM

## 2023-06-29 NOTE — [facility] Suicide Risk Assessment (Signed)
Suicide Risk Assessment  Admission Assessment    [facility] Admission Suicide Risk Assessment   Nursing information obtained from:  Patient Demographic factors:  Male, Caucasian, Low socioeconomic status, Access to firearms Current Mental Status:  NA Loss Factors:  Loss of significant relationship, Financial problems / change in socioeconomic status (father passed ~ 2 months ago) Historical Factors:  Family history of mental illness or substance abuse Risk Reduction Factors:  NA  Total Time spent with patient: 45 minutes Principal Problem: MDD (major depressive disorder), recurrent, severe, with psychosis (HCC) Diagnosis:  Principal Problem:   MDD (major depressive disorder), recurrent, severe, with psychosis (HCC) Active Problems:   Hyperlipidemia, mixed   TOBACCO USE   CAD - CABG '09, OM3 stent March 2010, last cath 4/13- medical Rx   HTN (hypertension)   Anxiety   Sciatica of right side   Dental caries   Degenerative disc disease at L5-S1 level   Prediabetes   Severe alcohol dependence (HCC)   Severe tetrahydrocannabinol (THC) dependence (HCC)  Subjective Data: "I made a remark that I'm dying any way, I might as well just speed it up. There is a million ways I could do it, but I don't. My son took the guns and put them up. I think about it, but I don't have the heart to do it."   Continued Clinical Symptoms: depressed mood, anhedonia, insomnia, psychomotor retardation, fatigue, feelings of worthlessness/guilt, difficulty concentrating, hopelessness, recurrent thoughts of death, suicidal thoughts with specific plan, anxiety, panic attacks, loss of energy/fatigue, disturbed sleep, decreased appetite, (Hypo) Manic Symptoms:  Distractibility, Impulsivity, Anxiety Symptoms:  Excessive Worry, Panic Symptoms, Psychotic Symptoms:  Hallucinations: Auditory  Alcohol Use Disorder Identification Test Final Score (AUDIT): 24 The "Alcohol Use Disorders Identification Test",  Guidelines for Use in Primary Care, Second Edition.  World Science writer [facility]). Score between 0-7:  no or low risk or alcohol related problems. Score between 8-15:  moderate risk of alcohol related problems. Score between 16-19:  high risk of alcohol related problems. Score 20 or above:  warrants further diagnostic evaluation for alcohol dependence and treatment.  CLINICAL FACTORS:   Depression:   Anhedonia Hopelessness Impulsivity Insomnia Severe More than one psychiatric diagnosis Previous Psychiatric Diagnoses and Treatments  Musculoskeletal: Strength & Muscle Tone: within normal limits Gait & Station: normal Patient leans: N/A  Psychiatric Specialty Exam:  Presentation  General Appearance:  Disheveled  Eye Contact: Fair  Speech: Clear and Coherent  Speech Volume: Normal  Handedness: Right   Mood and Affect  Mood: Depressed; Anxious  Affect: Congruent   Thought Process  Thought Processes: Coherent  Descriptions of Associations:Intact  Orientation:Full (Time, Place and Person)  Thought Content:Logical  History of Schizophrenia/Schizoaffective disorder:No data recorded Duration of Psychotic Symptoms:No data recorded Hallucinations:Hallucinations: None  Ideas of Reference:None  Suicidal Thoughts:Suicidal Thoughts: No SI Passive Intent and/or Plan: Without Intent; Without Plan  Homicidal Thoughts:Homicidal Thoughts: No   Sensorium  Memory: Recent Poor  Judgment: Poor  Insight: Poor   Executive Functions  Concentration: Fair  Attention Span: Fair  Recall: Poor  Fund of Knowledge: Poor  Language: Fair   Lexicographer Activity: Psychomotor Activity: Normal   Assets  Assets: Resilience; Housing   Sleep  Sleep: Sleep: Poor    Physical Exam: Physical Exam Vitals and nursing note reviewed.  Constitutional:      Appearance: Normal appearance.  Neurological:     Mental Status: He is  alert.    Review of Systems  Psychiatric/Behavioral:  Positive for  depression, hallucinations and substance abuse. Negative for memory loss and suicidal ideas. The patient is nervous/anxious and has insomnia.   All other systems reviewed and are negative.  Blood pressure 112/81, pulse 79, temperature 98.6 F (37 C), temperature source Oral, resp. rate 18, height 5\' 10"  (1.778 m), weight 83.8 kg, SpO2 98%. Body mass index is 26.52 kg/m.   COGNITIVE FEATURES THAT CONTRIBUTE TO RISK:  None    SUICIDE RISK:   Severe:  Frequent, intense, and enduring suicidal ideation, specific plan, no subjective intent, but some objective markers of intent (i.e., choice of lethal method), the method is accessible, some limited preparatory behavior, evidence of impaired self-control, severe dysphoria/symptomatology, multiple risk factors present, and few if any protective factors, particularly a lack of social support.  PLAN OF CARE: See H & P  I certify that inpatient services furnished can reasonably be expected to improve the patient's condition.   Starleen Blue, NP 06/29/2023, 2:14 PM

## 2023-06-29 NOTE — [facility] Group Notes (Signed)
Psychoeducational Group Note  Date:  06/29/2023 Time:  8:30  Group Topic/Focus:  Wrap-Up Group:   The focus of this group is to help patients review their daily goal of treatment and discuss progress on daily workbooks.  Participation Level: Did Not Attend  Participation Quality:  Not Applicable  Affect:  Not Applicable  Cognitive:  Not Applicable  Insight:  Not Applicable  Engagement in Group: Not Applicable  Additional Comments:  Pt wasn't in wrap -up  group due to being in bed asleep.  Tomeka Kantner, Sharen Counter 06/29/2023, 10:20 PM

## 2023-06-30 ENCOUNTER — Encounter (HOSPITAL_COMMUNITY): Payer: Self-pay

## 2023-06-30 DIAGNOSIS — F333 Major depressive disorder, recurrent, severe with psychotic symptoms: Secondary | ICD-10-CM

## 2023-06-30 LAB — BASIC METABOLIC PANEL
Anion gap: 7 (ref 5–15)
BUN: 12 mg/dL (ref 6–20)
CO2: 23 mmol/L (ref 22–32)
Calcium: 8.8 mg/dL — ABNORMAL LOW (ref 8.9–10.3)
Chloride: 103 mmol/L (ref 98–111)
Creatinine, Ser: 0.93 mg/dL (ref 0.61–1.24)
GFR, Estimated: >60 mL/min (ref >60)
Glucose, Bld: 117 mg/dL — ABNORMAL HIGH (ref 70–99)
Potassium: 4.4 mmol/L (ref 3.5–5.1)
Sodium: 133 mmol/L — ABNORMAL LOW (ref 135–145)

## 2023-06-30 LAB — TSH: TSH: 2.905 u[IU]/mL (ref 0.350–4.500)

## 2023-06-30 LAB — VITAMIN D 25 HYDROXY (VIT D DEFICIENCY, FRACTURES): Vit D, 25-Hydroxy: 42.33 ng/mL (ref 30–100)

## 2023-06-30 LAB — HEMOGLOBIN A1C
Hgb A1c MFr Bld: 5.5 % (ref 4.8–5.6)
Mean Plasma Glucose: 111.15 mg/dL

## 2023-06-30 LAB — LIPID PANEL
Cholesterol: 134 mg/dL (ref 0–200)
HDL: 52 mg/dL (ref >40)
LDL Cholesterol: 30 mg/dL (ref 0–99)
Total CHOL/HDL Ratio: 2.6 {ratio}
Triglycerides: 260 mg/dL — ABNORMAL HIGH (ref <150)
VLDL: 52 mg/dL — ABNORMAL HIGH (ref 0–40)

## 2023-06-30 LAB — VITAMIN B12: Vitamin B-12: 309 pg/mL (ref 180–914)

## 2023-06-30 LAB — FOLATE: Folate: 6.5 ng/mL (ref >5.9)

## 2023-06-30 MED ORDER — DULOXETINE HCL 60 MG PO CPEP
90.0000 mg | ORAL_CAPSULE | Freq: Every day | ORAL | Status: DC
  Administered 2023-07-01 – 2023-07-03 (×3): 90 mg via ORAL
  Filled 2023-06-30 (×4): qty 1

## 2023-06-30 MED ORDER — GABAPENTIN 300 MG PO CAPS
300.0000 mg | ORAL_CAPSULE | Freq: Two times a day (BID) | ORAL | Status: DC
  Administered 2023-06-30 – 2023-07-03 (×6): 300 mg via ORAL
  Filled 2023-06-30 (×9): qty 1

## 2023-06-30 MED ORDER — TRAZODONE HCL 50 MG PO TABS
50.0000 mg | ORAL_TABLET | Freq: Every day | ORAL | Status: DC
  Administered 2023-06-30 – 2023-07-02 (×3): 50 mg via ORAL
  Filled 2023-06-30 (×5): qty 1

## 2023-06-30 MED ORDER — NICOTINE POLACRILEX 2 MG MT GUM
2.0000 mg | CHEWING_GUM | OROMUCOSAL | Status: DC | PRN
  Administered 2023-06-30 – 2023-07-03 (×7): 2 mg via ORAL
  Filled 2023-06-30 (×3): qty 1

## 2023-06-30 NOTE — Progress Notes (Signed)
   06/30/23 1100  Psych Admission Type (Psych Patients Only)  Admission Status Involuntary  Psychosocial Assessment  Patient Complaints Anxiety;Substance abuse;Depression  Eye Contact Fair  Facial Expression Animated  Affect Anxious;Depressed  Speech Logical/coherent  Interaction Assertive;Childlike;Needy  Motor Activity Fidgety  Appearance/Hygiene In scrubs  Behavior Characteristics Cooperative  Mood Depressed;Anxious  Thought Process  Coherency WDL  Content WDL  Delusions None reported or observed  Perception WDL  Hallucination None reported or observed  Judgment Limited  Confusion None  Danger to Self  Current suicidal ideation? Denies  Agreement Not to Harm Self Yes  Description of Agreement verbal  Danger to Others  Danger to Others None reported or observed

## 2023-06-30 NOTE — Plan of Care (Signed)
  Problem: Education: Goal: Emotional status will improve Outcome: Progressing Goal: Mental status will improve Outcome: Progressing Goal: Verbalization of understanding the information provided will improve Outcome: Progressing   Problem: Activity: Goal: Interest or engagement in activities will improve Outcome: Progressing   

## 2023-06-30 NOTE — Group Note (Signed)
LCSW Group Therapy Note   Group Date: 06/30/2023 Start Time: 1100 End Time: 1200   .[facility] LCSW Group Therapy Note  Date/Time:    Type of Therapy and Topic:  Group Therapy:  Healthy and Unhealthy Supports  Participation Level:  Did Not Attend   Description of Group:  Patients in this group were introduced to the idea of adding a variety of healthy supports to address the various needs in their lives, especially in reference to their plans and focus for the new year.  Patients discussed what additional healthy supports could be helpful in their recovery and wellness after discharge in order to prevent future hospitalizations.   An emphasis was placed on using counselor, doctor, therapy groups, 12-step groups, and problem-specific support groups to expand supports.    Therapeutic Goals:   1)  discuss importance of adding supports to stay well once out of the hospital  2)  compare healthy versus unhealthy supports and identify some examples of each  3)  generate ideas and descriptions of healthy supports that can be added  4)  offer mutual support about how to address unhealthy supports  5)  encourage active participation in and adherence to discharge plan    Summary of Patient Progress:  The patient was invited, did not attend   Therapeutic Modalities:   Motivational Interviewing Brief Solution-Focused Therapy  Steffanie Dunn, LCSWA 06/30/2023  9:59 AM

## 2023-06-30 NOTE — Progress Notes (Signed)
[facility] MD Progress Note  06/30/2023 1:30 PM [name]  MRN:  [id number]  HPI: The patient is a 47 year old Caucasian male with prior mental health diagnoses of MDD & GAD who initially presented to the [facility] ER on 12/8 with complaints of SI & HI, asking for help before he does something "dumb", & stated: "my plan is to blow my brains out" as per ER documentation. While in the ER, pt was verbally aggressive, eloped and made it through the ambulance bay to the road prior to being brought back into the ER. BAL was 227, Tox screen positive for THC. He was IVC'd  & transferred to [facility] on 12/9 for treatment and stabilization of his mental status.   24-hour chart review: SBP today in the 140s, DBP in the 80s.  The rest of vital signs WNL.  Presented with anxiety for the past 24 hours as per nursing documentation, has denied SI/HI/AVH in the past 24 hours.  Slept 7.75 hours per nursing documentation, attending unit group sessions per nursing documentation, no behavioral episodes in the past 24 hours PRNs consisted of oxycodone, Nicorette gum.  Patient assessment: On assessment today, the pt reports that their mood is still depressed, and rates depression today as 5, with 10 being worst. Reports that anxiety is significantly high, rates anxiety as a 10, with 10 being worst. Sleep is very poor as per patient, states that he only slept for 2 to 3 hours even though staff documented 7.75.  He is educated to let staff know if he is not sleeping at night, that way he can get some more medications for sleep. Appetite is Fair Concentration is poor   Energy level is poor  Denies suicidal thoughts. Denies suicidal intent and plan.  Denies having any HI.  Denies having psychotic symptoms.   Denies having side effects to current psychiatric medications.  Patient reports that he is tolerating current medication regimen well, denies medication related side effects.  Thinks daytime anxiety is remaining  significantly high, we will add 300 mg of gabapentin in the afternoons at 2 PM.  Also adding trazodone 50 mg nightly for sleep.  We will continue other medications the same with no changes, and continue to monitor symptoms.  Patient asking for Ativan for discharge, but writer educated him today that Ativan is on a taper for alcohol detoxification, and that he will not be discharging on Ativan.  He continues to be adamant that he wants to go home on Ativan, and education again reiterated on the fact that we will not discharge him on this medication due to its habit-forming nature.  Projected discharge date for patient will be sometimes this weekend, but he continues to require continuous hospitalization due to continuous reports of anxiety and depressive symptoms.  Patient continues to state that he is not interested in rehab currently.  Principal Problem: MDD (major depressive disorder), recurrent, severe, with psychosis (HCC) Diagnosis: Principal Problem:   MDD (major depressive disorder), recurrent, severe, with psychosis (HCC) Active Problems:   Hyperlipidemia, mixed   TOBACCO USE   CAD - CABG '09, OM3 stent March 2010, last cath 4/13- medical Rx   HTN (hypertension)   Anxiety   Sciatica of right side   Dental caries   Degenerative disc disease at L5-S1 level   Prediabetes   Severe alcohol dependence (HCC)   Severe tetrahydrocannabinol (THC) dependence (HCC)  Total Time spent with patient: 45 minutes  Past Psychiatric History: See H & P  Past  Medical History:  Past Medical History:  Diagnosis Date   Anxiety    Arthritis    CHF (congestive heart failure) (HCC)    Complication of anesthesia    difficulty awakening following appendectomy    Dizziness    with humidity    History of laparoscopic appendectomy 09/18/2013   Hx of CABG    Hyperlipidemia    Hypertension    Kidney stones    MYOCARDIAL INFARCTION 10/13/2008   Qualifier: Diagnosis of  By: Juanda Chance, MD, Johny Chess     Numbness in right leg    pt relates to sciatica   Right kidney stone 08/06/2014   Noted incidentally on CT January 2016; ~0.6 cm, non-obstructing    Tobacco user     Past Surgical History:  Procedure Laterality Date   APPENDECTOMY     CARDIAC CATHETERIZATION     CORONARY ARTERY BYPASS GRAFT     status post diaphragmatic wall infarction Rx BMS RCA 03-11-08    CORONARY STENT PLACEMENT     CYSTOSCOPY WITH RETROGRADE PYELOGRAM, URETEROSCOPY AND STENT PLACEMENT Bilateral 03/16/2015   Procedure: CYSTOSCOPY WITH BILATERAL RETROGRADE PYELOGRAM, RIGHT URETEROSCOPY, STONE BASKETRY WITH LASER LITHOTRIPSY ON THE RIGHT,  AND BILATERAL STENT PLACEMENT;  Surgeon: Malen Gauze, MD;  Location: [facility];  Service: Urology;  Laterality: Bilateral;   CYSTOSCOPY WITH RETROGRADE PYELOGRAM, URETEROSCOPY AND STENT PLACEMENT Left 04/02/2015   Procedure: CYSTOSCOPY WITH RETROGRADE PYELOGRAM, URETEROSCOPY REMOVAL BILATERAL STENTS;  Surgeon: Malen Gauze, MD;  Location: [facility];  Service: Urology;  Laterality: Left;   HOLMIUM LASER APPLICATION Right 03/16/2015   Procedure: HOLMIUM LASER APPLICATION;  Surgeon: Malen Gauze, MD;  Location: [facility];  Service: Urology;  Laterality: Right;   LAPAROSCOPIC APPENDECTOMY N/A 09/17/2013   Procedure: APPENDECTOMY LAPAROSCOPIC;  Surgeon: Shelly Rubenstein, MD;  Location: [facility] OR;  Service: General;  Laterality: N/A;   LEFT HEART CATHETERIZATION WITH CORONARY/GRAFT ANGIOGRAM N/A 10/19/2011   Procedure: LEFT HEART CATHETERIZATION WITH Isabel Caprice;  Surgeon: Herby Abraham, MD;  Location: [facility] CATH LAB;  Service: Cardiovascular;  Laterality: N/A;   ORTHOPEDIC SURGERY     TONSILLECTOMY     Family History:  Family History  Adopted: Yes  Problem Relation Age of Onset   Cancer Mother    Diabetes Mother    Hyperlipidemia Mother    Hypertension Mother    Heart failure Mother        Died age 47   Cancer Sister    Diabetes Sister    Hyperlipidemia Sister     Hypertension Sister    Family Psychiatric  History: See H & P Social History:  Social History   Substance and Sexual Activity  Alcohol Use Yes     Social History   Substance and Sexual Activity  Drug Use Yes   Types: Marijuana    Social History   Socioeconomic History   Marital status: Divorced    Spouse name: Not on file   Number of children: 4   Years of education: Not on file   Highest education level: Not on file  Occupational History   Not on file  Tobacco Use   Smoking status: Every Day    Current packs/day: 0.50    Average packs/day: 0.5 packs/day for 35.0 years (17.5 ttl pk-yrs)    Types: Cigarettes   Smokeless tobacco: Former    Types: Snuff    Quit date: 07/20/1994   Tobacco comments:    5-6 cigs/day  Vaping Use  Vaping status: Never Used  Substance and Sexual Activity   Alcohol use: Yes   Drug use: Yes    Types: Marijuana   Sexual activity: Not Currently  Other Topics Concern   Not on file  Social History Narrative   Lives with girlfriend and two children.  He has four children.     Social Determinants of Health   Financial Resource Strain: Not on file  Food Insecurity: Food Insecurity Present (06/28/2023)   Hunger Vital Sign    Worried About Running Out of Food in the Last Year: Often true    Ran Out of Food in the Last Year: Often true  Transportation Needs: No Transportation Needs (06/28/2023)   PRAPARE - Administrator, Civil Service (Medical): No    Lack of Transportation (Non-Medical): No  Physical Activity: Not on file  Stress: Not on file  Social Connections: Not on file    Sleep: Poor  Appetite:  Fair  Current Medications: Current Facility-Administered Medications  Medication Dose Route Frequency Provider Last Rate Last Admin   acetaminophen (TYLENOL) tablet 650 mg  650 mg Oral Q4H PRN Eligha Bridegroom, NP   650 mg at 06/30/23 0449   alum & mag hydroxide-simeth (MAALOX/MYLANTA) 200-200-20 MG/5ML suspension 30 mL  30 mL  Oral Q4H PRN Eligha Bridegroom, NP   30 mL at 06/29/23 2110   atorvastatin (LIPITOR) tablet 80 mg  80 mg Oral Daily Eligha Bridegroom, NP   80 mg at 06/30/23 0801   baclofen (LIORESAL) tablet 10 mg  10 mg Oral TID Eligha Bridegroom, NP   10 mg at 06/30/23 1158   clopidogrel (PLAVIX) tablet 75 mg  75 mg Oral Daily Eligha Bridegroom, NP   75 mg at 06/30/23 0801   [START ON 07/01/2023] DULoxetine (CYMBALTA) DR capsule 90 mg  90 mg Oral Daily Golda Acre, MD       fenofibrate tablet 54 mg  54 mg Oral Daily Eligha Bridegroom, NP   54 mg at 06/30/23 0802   gabapentin (NEURONTIN) capsule 300 mg  300 mg Oral BID Starleen Blue, NP       gabapentin (NEURONTIN) capsule 600 mg  600 mg Oral QHS Eligha Bridegroom, NP   600 mg at 06/29/23 2109   hydrOXYzine (ATARAX) tablet 25 mg  25 mg Oral Q6H PRN Starleen Blue, NP   25 mg at 06/30/23 1117   isosorbide mononitrate (IMDUR) 24 hr tablet 60 mg  60 mg Oral Daily Eligha Bridegroom, NP   60 mg at 06/30/23 0801   loperamide (IMODIUM) capsule 2-4 mg  2-4 mg Oral PRN Starleen Blue, NP       LORazepam (ATIVAN) tablet 1 mg  1 mg Oral Q6H PRN Starleen Blue, NP       LORazepam (ATIVAN) tablet 1 mg  1 mg Oral TID Starleen Blue, NP   1 mg at 06/30/23 1200   Followed by   Melene Muller ON 07/01/2023] LORazepam (ATIVAN) tablet 1 mg  1 mg Oral BID Starleen Blue, NP       Followed by   Melene Muller ON 07/03/2023] LORazepam (ATIVAN) tablet 1 mg  1 mg Oral Daily Helana Macbride, NP       losartan (COZAAR) tablet 100 mg  100 mg Oral Daily Massengill, Nathan, MD   100 mg at 06/30/23 0802   magnesium hydroxide (MILK OF MAGNESIA) suspension 30 mL  30 mL Oral Daily PRN Eligha Bridegroom, NP       metoprolol tartrate (LOPRESSOR) tablet 25 mg  25 mg Oral BID Eligha Bridegroom, NP   25 mg at 06/30/23 0802   multivitamin with minerals tablet 1 tablet  1 tablet Oral Daily Starleen Blue, NP   1 tablet at 06/30/23 0802   nicotine (NICODERM CQ - dosed in mg/24 hours) patch 21 mg  21 mg Transdermal Daily  Starleen Blue, NP   21 mg at 06/30/23 4696   nicotine polacrilex (NICORETTE) gum 2 mg  2 mg Oral PRN Starleen Blue, NP   2 mg at 06/30/23 0940   OLANZapine (ZYPREXA) injection 10 mg  10 mg Intramuscular TID PRN Eligha Bridegroom, NP       OLANZapine (ZYPREXA) injection 5 mg  5 mg Intramuscular TID PRN Eligha Bridegroom, NP       OLANZapine zydis (ZYPREXA) disintegrating tablet 5 mg  5 mg Oral TID PRN Eligha Bridegroom, NP       ondansetron (ZOFRAN) tablet 4 mg  4 mg Oral Q8H PRN Sindy Guadeloupe, NP   4 mg at 06/30/23 0816   oxyCODONE-acetaminophen (PERCOCET/ROXICET) 5-325 MG per tablet 2 tablet  2 tablet Oral Q8H PRN Eligha Bridegroom, NP   2 tablet at 06/30/23 0008   ranolazine (RANEXA) 12 hr tablet 500 mg  500 mg Oral BID Eligha Bridegroom, NP   500 mg at 06/30/23 0802   thiamine (Vitamin B-1) tablet 100 mg  100 mg Oral Daily Starleen Blue, NP   100 mg at 06/30/23 0801   traZODone (DESYREL) tablet 50 mg  50 mg Oral QHS Starleen Blue, NP       valACYclovir (VALTREX) tablet 1,000 mg  1,000 mg Oral BID Starleen Blue, NP   1,000 mg at 06/30/23 0801    Lab Results: No results found for this or any previous visit (from the past 48 hour(s)).  Blood Alcohol level:  Lab Results  Component Value Date   ETH 227 (H) 06/28/2023   ETH (H) 10/26/2007    250        LOWEST DETECTABLE LIMIT FOR SERUM ALCOHOL IS 11 mg/dL FOR MEDICAL PURPOSES ONLY    Metabolic Disorder Labs: Lab Results  Component Value Date   HGBA1C 6.1 (H) 08/24/2019   MPG 126 07/25/2018   MPG 125.5 05/02/2017   No results found for: "PROLACTIN" Lab Results  Component Value Date   CHOL 130 08/24/2019   TRIG 190 (H) 08/24/2019   HDL 36 (L) 08/24/2019   CHOLHDL 3.6 08/24/2019   VLDL 67 (H) 05/12/2016   LDLCALC 62 08/24/2019   LDLCALC  07/25/2018     Comment:     . LDL cholesterol not calculated. Triglyceride levels greater than 400 mg/dL invalidate calculated LDL results. . Reference range: <100 . Desirable range <100  mg/dL for primary prevention;   <70 mg/dL for patients with CHD or diabetic patients  with > or = 2 CHD risk factors. Marland Kitchen LDL-C is now calculated using the Martin-Hopkins  calculation, which is a validated novel method providing  better accuracy than the Friedewald equation in the  estimation of LDL-C.  Horald Pollen et al. Lenox Ahr. 2952;841(32): 2061-2068  (http://education.[employer].com/faq/FAQ164)     Physical Findings: AIMS:  , ,  ,  ,    CIWA:  CIWA-Ar Total: 3 COWS:     Musculoskeletal: Strength & Muscle Tone: within normal limits Gait & Station: normal Patient leans: N/A  Psychiatric Specialty Exam:  Presentation  General Appearance:  Disheveled  Eye Contact: Good  Speech: Clear and Coherent; Normal Rate  Speech Volume: Normal  Handedness: Right  Mood and Affect  Mood: Anxious; Depressed  Affect: Congruent   Thought Process  Thought Processes: Coherent  Descriptions of Associations:Intact  Orientation:Full (Time, Place and Person)  Thought Content:Logical; WDL  History of Schizophrenia/Schizoaffective disorder:No data recorded Duration of Psychotic Symptoms:No data recorded Hallucinations:Hallucinations: None  Ideas of Reference:None  Suicidal Thoughts:Suicidal Thoughts: No  Homicidal Thoughts:Homicidal Thoughts: No   Sensorium  Memory: Recent Poor  Judgment: Fair  Insight: Fair   Art therapist  Concentration: Good  Attention Span: Good  Recall: Good  Fund of Knowledge: Fair  Language: Good   Psychomotor Activity  Psychomotor Activity: Psychomotor Activity: Normal   Assets  Assets: Desire for Improvement; Communication Skills; Resilience; Housing   Sleep  Sleep: Sleep: Poor    Physical Exam: Physical Exam Vitals and nursing note reviewed.    Review of Systems  Psychiatric/Behavioral:  Positive for depression and substance abuse. Negative for hallucinations, memory loss and suicidal  ideas. The patient is nervous/anxious and has insomnia.   All other systems reviewed and are negative.  Blood pressure (!) 146/88, pulse 76, temperature 98.6 F (37 C), temperature source Oral, resp. rate 18, height 5\' 10"  (1.778 m), weight 83.8 kg, SpO2 100%. Body mass index is 26.52 kg/m.  Treatment Plan Summary: Daily contact with patient to assess and evaluate symptoms and progress in treatment and Medication management   Safety and Monitoring: Voluntary admission to inpatient psychiatric unit for safety, stabilization and treatment Daily contact with patient to assess and evaluate symptoms and progress in treatment Patient's case to be discussed in multi-disciplinary team meeting Observation Level : q15 minute checks Vital signs: q12 hours Precautions: Safety   Long Term Goal(s): Improvement in symptoms so as ready for discharge   Short Term Goals: Ability to identify changes in lifestyle to reduce recurrence of condition will improve, Ability to verbalize feelings will improve, Ability to disclose and discuss suicidal ideas, Ability to demonstrate self-control will improve, Ability to identify and develop effective coping behaviors will improve, Ability to maintain clinical measurements within normal limits will improve, Compliance with prescribed medications will improve, and Ability to identify triggers associated with substance abuse/mental health issues will improve   Diagnoses Principal Problem:   MDD (major depressive disorder), recurrent, severe, with psychosis (HCC) Active Problems:   Hyperlipidemia, mixed   TOBACCO USE   CAD - CABG '09, OM3 stent March 2010, last cath 4/13- medical Rx   HTN (hypertension)   Anxiety   Sciatica of right side   Dental caries   Degenerative disc disease at L5-S1 level   Prediabetes   Severe alcohol dependence (HCC)   Severe tetrahydrocannabinol (THC) dependence (HCC)   Medications -Cymbalta 60 mg daily for MDD/GAD/chronic  pain -Increase gabapentin to 300 mg BID instead of daily for EtOH use/GAD/neuropathic pain -Continue gabapentin 600 mg nightly for EtOH use/GAD/neuropathic pain -Continue Ativan detox taper for alcohol use disorder-See the MAR for details -Start Trazodone 50 mg nightly for sleep   Continue the following medical medications -Atorvastatin 80 mg for hyperlipidemia -Fenofibrate 54 mg daily -Plavix 75 mg daily -Lopressor 25 mg twice daily for hypertension -Ranexa 500 mg twice daily -Continue Valtrex 1000 mg twice daily for genital herpes infection   PRNS -Continue oxycodone-acetaminophen 5/325 2 tabs as needed every 8 hours for pain (home medication) -Continue agitation protocol medications: Ativan/Benadryl/Zyprexa as needed as per the [facility] -Continue hydroxyzine 25 mg 3 times daily as needed for anxiety -Continue Tylenol 650 mg every 6 hours PRN for mild pain -Continue Maalox 30 mg  every 4 hrs PRN for indigestion -Continue Milk of Magnesia as needed every 6 hrs for constipation   Provided with education on all medications including rationales, benefits, and possible side effects, agreeable to trials of all medications.   Labs reviewed: Ordered lipid panel, hemoglobin A1c, TSH, vitamin D, B12, repeat BMP to check potassium level since he was 3.4 yesterday.  QTc from 2 days ago is 444.-The above labs ordered yesterday, and still pending.   Discharge Planning: Social work and case management to assist with discharge planning and identification of hospital follow-up needs prior to discharge Estimated LOS: 5-7 days Discharge Concerns: Need to establish a safety plan; Medication compliance and effectiveness Discharge Goals: Return home with outpatient referrals for mental health follow-up including medication management/psychotherapy   I certify that inpatient services furnished can reasonably be expected to improve the patient's condition.    Starleen Blue, NP 06/30/2023, 1:30 PM

## 2023-06-30 NOTE — [facility] Group Notes (Signed)

## 2023-06-30 NOTE — BH IP Treatment Plan (Signed)
Interdisciplinary Treatment and Diagnostic Plan Update  06/30/2023 Time of Session: 10:30AM [name] MRN: [id number]  Principal Diagnosis: MDD (major depressive disorder), recurrent, severe, with psychosis (HCC)  Secondary Diagnoses: Principal Problem:   MDD (major depressive disorder), recurrent, severe, with psychosis (HCC) Active Problems:   Hyperlipidemia, mixed   TOBACCO USE   CAD - CABG '09, OM3 stent March 2010, last cath 4/13- medical Rx   HTN (hypertension)   Anxiety   Sciatica of right side   Dental caries   Degenerative disc disease at L5-S1 level   Prediabetes   Severe alcohol dependence (HCC)   Severe tetrahydrocannabinol (THC) dependence (HCC)   Current Medications:  Current Facility-Administered Medications  Medication Dose Route Frequency Provider Last Rate Last Admin   acetaminophen (TYLENOL) tablet 650 mg  650 mg Oral Q4H PRN Eligha Bridegroom, NP   650 mg at 06/30/23 0449   alum & mag hydroxide-simeth (MAALOX/MYLANTA) 200-200-20 MG/5ML suspension 30 mL  30 mL Oral Q4H PRN Eligha Bridegroom, NP   30 mL at 06/29/23 2110   atorvastatin (LIPITOR) tablet 80 mg  80 mg Oral Daily Eligha Bridegroom, NP   80 mg at 06/30/23 0801   baclofen (LIORESAL) tablet 10 mg  10 mg Oral TID Eligha Bridegroom, NP   10 mg at 06/30/23 1158   clopidogrel (PLAVIX) tablet 75 mg  75 mg Oral Daily Eligha Bridegroom, NP   75 mg at 06/30/23 0801   [START ON 07/01/2023] DULoxetine (CYMBALTA) DR capsule 90 mg  90 mg Oral Daily Golda Acre, MD       fenofibrate tablet 54 mg  54 mg Oral Daily Eligha Bridegroom, NP   54 mg at 06/30/23 0802   gabapentin (NEURONTIN) capsule 300 mg  300 mg Oral BID Starleen Blue, NP       gabapentin (NEURONTIN) capsule 600 mg  600 mg Oral QHS Eligha Bridegroom, NP   600 mg at 06/29/23 2109   hydrOXYzine (ATARAX) tablet 25 mg  25 mg Oral Q6H PRN Starleen Blue, NP   25 mg at 06/30/23 1117   isosorbide mononitrate (IMDUR) 24 hr tablet 60 mg  60 mg Oral Daily  Eligha Bridegroom, NP   60 mg at 06/30/23 0801   loperamide (IMODIUM) capsule 2-4 mg  2-4 mg Oral PRN Starleen Blue, NP       LORazepam (ATIVAN) tablet 1 mg  1 mg Oral Q6H PRN Starleen Blue, NP       LORazepam (ATIVAN) tablet 1 mg  1 mg Oral TID Starleen Blue, NP   1 mg at 06/30/23 1200   Followed by   Melene Muller ON 07/01/2023] LORazepam (ATIVAN) tablet 1 mg  1 mg Oral BID Starleen Blue, NP       Followed by   Melene Muller ON 07/03/2023] LORazepam (ATIVAN) tablet 1 mg  1 mg Oral Daily Nkwenti, Doris, NP       losartan (COZAAR) tablet 100 mg  100 mg Oral Daily Massengill, Nathan, MD   100 mg at 06/30/23 0802   magnesium hydroxide (MILK OF MAGNESIA) suspension 30 mL  30 mL Oral Daily PRN Eligha Bridegroom, NP       metoprolol tartrate (LOPRESSOR) tablet 25 mg  25 mg Oral BID Eligha Bridegroom, NP   25 mg at 06/30/23 0802   multivitamin with minerals tablet 1 tablet  1 tablet Oral Daily Starleen Blue, NP   1 tablet at 06/30/23 0802   nicotine (NICODERM CQ - dosed in mg/24 hours) patch 21 mg  21  mg Transdermal Daily Starleen Blue, NP   21 mg at 06/30/23 1610   nicotine polacrilex (NICORETTE) gum 2 mg  2 mg Oral PRN Starleen Blue, NP   2 mg at 06/30/23 0940   OLANZapine (ZYPREXA) injection 10 mg  10 mg Intramuscular TID PRN Eligha Bridegroom, NP       OLANZapine (ZYPREXA) injection 5 mg  5 mg Intramuscular TID PRN Eligha Bridegroom, NP       OLANZapine zydis (ZYPREXA) disintegrating tablet 5 mg  5 mg Oral TID PRN Eligha Bridegroom, NP       ondansetron (ZOFRAN) tablet 4 mg  4 mg Oral Q8H PRN Sindy Guadeloupe, NP   4 mg at 06/30/23 0816   oxyCODONE-acetaminophen (PERCOCET/ROXICET) 5-325 MG per tablet 2 tablet  2 tablet Oral Q8H PRN Eligha Bridegroom, NP   2 tablet at 06/30/23 0008   ranolazine (RANEXA) 12 hr tablet 500 mg  500 mg Oral BID Eligha Bridegroom, NP   500 mg at 06/30/23 0802   thiamine (Vitamin B-1) tablet 100 mg  100 mg Oral Daily Starleen Blue, NP   100 mg at 06/30/23 0801   valACYclovir (VALTREX)  tablet 1,000 mg  1,000 mg Oral BID Starleen Blue, NP   1,000 mg at 06/30/23 0801   PTA Medications: Medications Prior to Admission  Medication Sig Dispense Refill Last Dose   atorvastatin (LIPITOR) 80 MG tablet TAKE 1 TABLET BY MOUTH ONCE DAILY AT 6 PM 90 tablet 2 Past Month   baclofen (LIORESAL) 10 MG tablet TAKE 1 TABLET THREE TIMES DAILY 270 tablet 0 Past Month   clopidogrel (PLAVIX) 75 MG tablet TAKE 1 TABLET BY MOUTH ONCE DAILY IN THE MORNING 90 tablet 2 Past Month   DULoxetine (CYMBALTA) 60 MG capsule Take 1 capsule (60 mg total) by mouth daily. 30 capsule 3 Past Month   fenofibrate (TRICOR) 48 MG tablet Take 1 tablet (48 mg total) by mouth daily. 90 tablet 2 Past Month   gabapentin (NEURONTIN) 300 MG capsule TAKE 1 CAPSULE BY MOUTH IN THE MORNING, TAKE 1 CAPSULE BY MOUTH IN THE EVENING AND TAKE 2 CAPSULES BY MOUTH AT BEDTIME 360 capsule 2 Past Month   isosorbide mononitrate (IMDUR) 60 MG 24 hr tablet Take 1 tablet (60 mg total) by mouth daily. 90 tablet 2 Past Month   losartan (COZAAR) 100 MG tablet TAKE 1 TABLET (100 MG TOTAL) BY MOUTH DAILY. 90 tablet 2 Past Month   metFORMIN (GLUCOPHAGE) 1000 MG tablet Take 500 mg by mouth 2 (two) times daily with a meal.   Past Month   metoprolol tartrate (LOPRESSOR) 25 MG tablet Take 1 tablet (25 mg total) by mouth 2 (two) times daily. 180 tablet 2 Past Month   MOUNJARO 2.5 MG/0.5ML Pen Inject 2.5 mg into the skin once a week.   Past Month   Multiple Vitamin (MULTI-VITAMIN DAILY PO) Take 1 Dose by mouth daily. [employer] vitamin pack takes 1 packet by mouth daily   Past Month   naloxone (NARCAN) nasal spray 4 mg/0.1 mL Spray 1 squirt in each nostril for signs of opioid overdose including unresponsiveness and decreased breathing 1 each 0 Past Month   nitroGLYCERIN (NITROSTAT) 0.4 MG SL tablet Place 1 tablet (0.4 mg total) under the tongue every 5 (five) minutes as needed for chest pain. 25 tablet 6 Past Month   ranolazine (RANEXA) 500 MG 12 hr tablet Take 1  tablet (500 mg total) by mouth 2 (two) times daily. 180 tablet 2 Past Month  oxyCODONE-acetaminophen (PERCOCET) 10-325 MG tablet Take 1 tablet by mouth every 8 (eight) hours as needed.       Patient Stressors:    Patient Strengths:    Treatment Modalities: Medication Management, Group therapy, Case management,  1 to 1 session with clinician, Psychoeducation, Recreational therapy.   Physician Treatment Plan for Primary Diagnosis: MDD (major depressive disorder), recurrent, severe, with psychosis (HCC) Long Term Goal(s): Improvement in symptoms so as ready for discharge   Short Term Goals: Ability to identify changes in lifestyle to reduce recurrence of condition will improve Ability to verbalize feelings will improve Ability to disclose and discuss suicidal ideas Ability to demonstrate self-control will improve Ability to identify and develop effective coping behaviors will improve Ability to maintain clinical measurements within normal limits will improve Compliance with prescribed medications will improve Ability to identify triggers associated with substance abuse/mental health issues will improve  Medication Management: Evaluate patient's response, side effects, and tolerance of medication regimen.  Therapeutic Interventions: 1 to 1 sessions, Unit Group sessions and Medication administration.  Evaluation of Outcomes: Not Progressing  Physician Treatment Plan for Secondary Diagnosis: Principal Problem:   MDD (major depressive disorder), recurrent, severe, with psychosis (HCC) Active Problems:   Hyperlipidemia, mixed   TOBACCO USE   CAD - CABG '09, OM3 stent March 2010, last cath 4/13- medical Rx   HTN (hypertension)   Anxiety   Sciatica of right side   Dental caries   Degenerative disc disease at L5-S1 level   Prediabetes   Severe alcohol dependence (HCC)   Severe tetrahydrocannabinol (THC) dependence (HCC)  Long Term Goal(s): Improvement in symptoms so as ready for  discharge   Short Term Goals: Ability to identify changes in lifestyle to reduce recurrence of condition will improve Ability to verbalize feelings will improve Ability to disclose and discuss suicidal ideas Ability to demonstrate self-control will improve Ability to identify and develop effective coping behaviors will improve Ability to maintain clinical measurements within normal limits will improve Compliance with prescribed medications will improve Ability to identify triggers associated with substance abuse/mental health issues will improve     Medication Management: Evaluate patient's response, side effects, and tolerance of medication regimen.  Therapeutic Interventions: 1 to 1 sessions, Unit Group sessions and Medication administration.  Evaluation of Outcomes: Not Progressing   RN Treatment Plan for Primary Diagnosis: MDD (major depressive disorder), recurrent, severe, with psychosis (HCC) Long Term Goal(s): Knowledge of disease and therapeutic regimen to maintain health will improve  Short Term Goals: Ability to remain free from injury will improve, Ability to verbalize frustration and anger appropriately will improve, Ability to demonstrate self-control, Ability to participate in decision making will improve, Ability to verbalize feelings will improve, Ability to disclose and discuss suicidal ideas, Ability to identify and develop effective coping behaviors will improve, and Compliance with prescribed medications will improve  Medication Management: RN will administer medications as ordered by provider, will assess and evaluate patient's response and provide education to patient for prescribed medication. RN will report any adverse and/or side effects to prescribing provider.  Therapeutic Interventions: 1 on 1 counseling sessions, Psychoeducation, Medication administration, Evaluate responses to treatment, Monitor vital signs and CBGs as ordered, Perform/monitor CIWA, COWS, AIMS  and Fall Risk screenings as ordered, Perform wound care treatments as ordered.  Evaluation of Outcomes: Not Progressing   LCSW Treatment Plan for Primary Diagnosis: MDD (major depressive disorder), recurrent, severe, with psychosis (HCC) Long Term Goal(s): Safe transition to appropriate next level of care at discharge, Engage  patient in therapeutic group addressing interpersonal concerns.  Short Term Goals: Engage patient in aftercare planning with referrals and resources, Increase social support, Increase ability to appropriately verbalize feelings, Increase emotional regulation, Facilitate acceptance of mental health diagnosis and concerns, Facilitate patient progression through stages of change regarding substance use diagnoses and concerns, Identify triggers associated with mental health/substance abuse issues, and Increase skills for wellness and recovery  Therapeutic Interventions: Assess for all discharge needs, 1 to 1 time with Social worker, Explore available resources and support systems, Assess for adequacy in community support network, Educate family and significant other(s) on suicide prevention, Complete Psychosocial Assessment, Interpersonal group therapy.  Evaluation of Outcomes: Not Progressing   Progress in Treatment: Attending groups: Yes. Participating in groups: Yes. Taking medication as prescribed: Yes. Toleration medication: Yes. Family/Significant other contact made: No, will contact:  Mehul Plotnick [phone] Patient understands diagnosis: Yes. Discussing patient identified problems/goals with staff: Yes. Medical problems stabilized or resolved: Yes. Denies suicidal/homicidal ideation: Yes. Issues/concerns per patient self-inventory: No.  New problem(s) identified: No, Describe:  none reported  New Short Term/Long Term Goal(s): detox, medication management for mood stabilization; elimination of SI thoughts; development of comprehensive mental wellness/sobriety  plan   Patient Goals:  "Get discharged and get on depression medications"  Discharge Plan or Barriers: Patient recently admitted. CSW will continue to follow and assess for appropriate referrals and possible discharge planning.    Reason for Continuation of Hospitalization: Depression Medication stabilization Suicidal ideation Withdrawal symptoms  Estimated Length of Stay: 5-7 days  Last 3 [place] Suicide Severity Risk Score: Flowsheet Row Ad[facility] (Current) [facility] from [facility]27/2023 in [facility] ED from 05/13/2022 in [facility] Emergency Department at [place] [facility]  C-SSRS RISK CATEGORY No Risk High Risk No Risk       Last [facility] 2/9 Scores:    07/20/2019   10:25 AM 06/07/2017   10:16 AM 01/10/2015    8:42 AM  Depression screen PHQ 2/9  Decreased Interest 0 1 0  Down, Depressed, Hopeless 1 1 0  PHQ - 2 Score 1 2 0  Altered sleeping  2   Tired, decreased energy  0   Change in appetite  1   Feeling bad or failure about yourself   0   Trouble concentrating  1   Moving slowly or fidgety/restless  0   Suicidal thoughts  0   PHQ-9 Score  6   Difficult doing work/chores  Somewhat difficult     Scribe for Treatment Team: Kathi Der, LCSWA 06/30/2023 1:25 PM

## 2023-06-30 NOTE — Group Note (Signed)
Date:  06/30/2023 Time:  10:21 AM  Group Topic/Focus:  Goals Group:   The focus of this group is to help patients establish daily goals to achieve during treatment and discuss how the patient can incorporate goal setting into their daily lives to aide in recovery. Orientation:   The focus of this group is to educate the patient on the purpose and policies of crisis stabilization and provide a format to answer questions about their admission.  The group details unit policies and expectations of patients while admitted.    Participation Level:  Minimal  Participation Quality:  Appropriate  Affect:  Appropriate  Cognitive:  Appropriate  Insight: Appropriate  Engagement in Group:  Limited  Modes of Intervention:  Discussion, Orientation, and Rapport Building  Additional Comments:   Pt attended the Orientation/Goals group with limited engagement. Pt did not identify a personal goal.  [name] 06/30/2023, 10:21 AM

## 2023-06-30 NOTE — Progress Notes (Signed)
   06/29/23 2109  Psych Admission Type (Psych Patients Only)  Admission Status Involuntary  Psychosocial Assessment  Patient Complaints Anxiety;Depression  Eye Contact Fair  Facial Expression Flat  Affect Anxious;Depressed  Speech Logical/coherent  Interaction Assertive;Childlike  Motor Activity Fidgety  Appearance/Hygiene In scrubs  Behavior Characteristics Cooperative  Mood Depressed;Anxious  Thought Process  Coherency WDL  Content WDL  Delusions None reported or observed  Perception WDL  Hallucination None reported or observed  Judgment Limited  Confusion Mild  Danger to Self  Current suicidal ideation? Denies  Agreement Not to Harm Self Yes  Description of Agreement verbal  Danger to Others  Danger to Others None reported or observed

## 2023-06-30 NOTE — Group Note (Signed)
Recreation Therapy Group Note   Group Topic:Team Building  Group Date: 06/30/2023 Start Time: 0930 End Time: 1000 Facilitators: Harlowe Dowler-McCall, LRT,CTRS Location: [address] Dayroom   Group Topic: Communication, Team Building, Problem Solving  Goal Area(s) Addresses:  Patient will effectively work with peer towards shared goal.  Patient will identify skills used to make activity successful.  Patient will identify how skills used during activity can be used to reach post d/c goals.   Intervention: STEM Activity  Group Description: Stage manager. In teams of 3-5, patients were given 12 plastic drinking straws and an equal length of masking tape. Using the materials provided, patients were asked to build a landing pad to catch a golf ball dropped from approximately 5 feet in the air. All materials were required to be used by the team in their design. LRT facilitated post-activity discussion.  Education: Pharmacist, community, Scientist, physiological, Discharge Planning   Education Outcome: Acknowledges education/In group clarification offered/Needs additional education.    Affect/Mood: Appropriate   Participation Level: Active   Participation Quality: Independent   Behavior: Cooperative and On-looking   Speech/Thought Process: Focused   Insight: Good   Judgement: Good   Modes of Intervention: STEM Activity   Patient Response to Interventions:  Receptive   Education Outcome:  In group clarification offered    Clinical Observations/Individualized Feedback: Pt was bright and attentive. Pt was observant at beginning of group. Pt later got involved in helping one of the teams make adjustments to their landing pad.     Plan: Continue to engage patient in RT group sessions 2-3x/week.   Emlyn Maves-McCall, LRT,CTRS  06/30/2023 1:21 PM

## 2023-06-30 NOTE — Progress Notes (Signed)
   06/30/23 0558  15 Minute Checks  Location Bedroom  Visual Appearance Calm  Behavior Sleeping  Sleep (Behavioral Health Patients Only)  Calculate sleep? (Click Yes once per 24 hr at 0600 safety check) Yes  Documented sleep last 24 hours 7.75

## 2023-06-30 NOTE — [facility] Group Notes (Signed)
Adult Psychoeducational Group Note  Date:  06/30/2023 Time:  10:45 PM  Group Topic/Focus:  Wrap-Up Group:   The focus of this group is to help patients review their daily goal of treatment and discuss progress on daily workbooks.  Participation Level:  Active  Participation Quality:  Attentive  Affect:  Appropriate  Cognitive:  Alert  Insight: Improving  Engagement in Group:  Engaged  Modes of Intervention:  Discussion  Additional Comments:  Pt attended and participated in NA group.  Maura Crandall Cassandra 06/30/2023, 10:45 PM

## 2023-06-30 NOTE — Plan of Care (Signed)
  Problem: Activity: Goal: Interest or engagement in activities will improve Outcome: Progressing   Problem: Health Behavior/Discharge Planning: Goal: Compliance with treatment plan for underlying cause of condition will improve Outcome: Progressing   Problem: Safety: Goal: Periods of time without injury will increase Outcome: Progressing   

## 2023-07-01 DIAGNOSIS — F333 Major depressive disorder, recurrent, severe with psychotic symptoms: Secondary | ICD-10-CM | POA: Diagnosis not present

## 2023-07-01 MED ORDER — BENZOCAINE 10 % MT GEL
Freq: Three times a day (TID) | OROMUCOSAL | Status: DC | PRN
  Administered 2023-07-02: 1 via OROMUCOSAL
  Filled 2023-07-01: qty 9

## 2023-07-01 MED ORDER — NALTREXONE HCL 50 MG PO TABS
50.0000 mg | ORAL_TABLET | Freq: Every day | ORAL | Status: DC
  Administered 2023-07-01 – 2023-07-03 (×3): 50 mg via ORAL
  Filled 2023-07-01 (×5): qty 1

## 2023-07-01 NOTE — Group Note (Signed)
LCSW Group Therapy Note  Group Date: 07/01/2023 Start Time: 1100 End Time: 1200   Type of Therapy and Topic:  Group Therapy - Healthy vs Unhealthy Coping Skills  Participation Level:  Active   Description of Group The focus of this group was to determine what unhealthy coping techniques typically are used by group members and what healthy coping techniques would be helpful in coping with various problems. Patients were guided in becoming aware of the differences between healthy and unhealthy coping techniques. Patients were asked to identify 2-3 healthy coping skills they would like to learn to use more effectively.  Therapeutic Goals Patients learned that coping is what human beings do all day long to deal with various situations in their lives Patients defined and discussed healthy vs unhealthy coping techniques Patients identified their preferred coping techniques and identified whether these were healthy or unhealthy Patients determined 2-3 healthy coping skills they would like to become more familiar with and use more often. Patients provided support and ideas to each other   Summary of Patient Progress:  Latravious attended and participated most of group. Patient demonstrated positive insight into the subject matter, was respectful of peers, and participated throughout the entire session.   Therapeutic Modalities Cognitive Behavioral Therapy Motivational Interviewing  Kathi Der, LCSWA 07/01/2023  3:06 PM

## 2023-07-01 NOTE — Group Note (Signed)
Date:  07/01/2023 Time:  5:15 AM  Group Topic/Focus:  Wrap-Up Group:   The focus of this group is to help patients review their daily goal of treatment and discuss progress on daily workbooks.    Participation Level:  Did Not Attend  Participation Quality:   n/a  Affect:   n/a  Cognitive:   n/a  Insight: None  Engagement in Group:   n/a  Modes of Intervention:   n/a  Additional Comments:  Patient did not attend wrap up group.   Kennieth Francois 07/01/2023, 5:15 AM

## 2023-07-01 NOTE — Progress Notes (Signed)
   07/01/23 1000  Psych Admission Type (Psych Patients Only)  Admission Status Involuntary  Psychosocial Assessment  Patient Complaints Substance abuse;Depression  Eye Contact Fair  Facial Expression Animated  Affect Anxious;Depressed  Speech Logical/coherent  Interaction Assertive;Childlike;Needy  Motor Activity Fidgety  Appearance/Hygiene Unremarkable  Behavior Characteristics Cooperative  Mood Anxious  Thought Process  Coherency WDL  Content WDL  Delusions None reported or observed  Perception WDL  Hallucination None reported or observed  Judgment Limited  Confusion None  Danger to Self  Current suicidal ideation? Denies  Agreement Not to Harm Self Yes  Description of Agreement verbal  Danger to Others  Danger to Others None reported or observed

## 2023-07-01 NOTE — [facility] Group Notes (Signed)
[facility] Group Notes:  (Nursing/MHT/Case Management/Adjunct)  Adult Psychoeducational Group Note  Date:  07/01/2023 Time:  9:29 AM  Group Topic/Focus:  Goals Group:   The focus of this group is to help patients establish daily goals to achieve during treatment and discuss how the patient can incorporate goal setting into their daily lives to aide in recovery. Orientation:   The focus of this group is to educate the patient on the purpose and policies of crisis stabilization and provide a format to answer questions about their admission.  The group details unit policies and expectations of patients while admitted.  Participation Level:  Did Not Attend    Additional Comments:  Did not attend  Lucilla Edin 07/01/2023, 9:29 AM

## 2023-07-01 NOTE — Plan of Care (Signed)
  Problem: Education: Goal: Knowledge of [place] [facility] Education information/materials will improve Outcome: Progressing Goal: Mental status will improve Outcome: Progressing   Problem: Activity: Goal: Interest or engagement in activities will improve Outcome: Progressing Goal: Sleeping patterns will improve Outcome: Progressing

## 2023-07-01 NOTE — Progress Notes (Signed)
[facility] MD Progress Note  07/01/2023 11:37 AM [name]  MRN:  [id number]  HPI: The patient is a 47 year old Caucasian male with prior mental health diagnoses of MDD & GAD who initially presented to the [facility] ER on 12/8 with complaints of SI & HI, asking for help before he does something "dumb", & stated: "my plan is to blow my brains out" as per ER documentation. While in the ER, pt was verbally aggressive, eloped and made it through the ambulance bay to the road prior to being brought back into the ER. BAL was 227, Tox screen positive for THC. He was IVC'd  & transferred to [facility] on 12/9 for treatment and stabilization of his mental status.   24-hour chart review: SBP today in the 137, DBP in the 75.  The rest of vital signs WNL.  Presented with anxiety for the past 24 hours as per nursing documentation, has denied SI/HI.  Patient endorsing AH of hearing voices this morning telling him to get out and run and also to "just go", with visual hallucination of seeing some black dots.  Slept 7.00 hours per nursing documentation, attending unit group sessions per nursing documentation, no behavioral episodes in the past 24 hours PRNs consisted of oxycodone, Nicorette gum.  Today's assessment notes: Patient reports that he is mood continues to be depressed, and rates depression today as 5, with 10 being worst.  Cymbalta increased from 60 mg to 90 mg p.o. daily for depression, GAD, and chronic pain.  Patient is in agreement with medication adjustment.  He reports that anxiety is significantly high, rates anxiety as a 10, with 10 being worst.  He continues on gabapentin 300 mg p.o. twice daily and 600 mg p.o. at bedtime.  We will continue to monitor patient's therapeutic effectiveness of gabapentin for anxiety.  As per nursing staff report, patient's sleep hygiene has improved, he slept for 7 hours last night with the help of trazodone and feeling restful.  Patient is concerned today about the altercation  he had with the police officers prior to admission.  Emotional support provided to the patient, for ongoing stressors.  Report marked pain Orajel ordered p.o. 3 times daily.  Patient informed nursing staff that he needs [facility] for his weight loss program, made patient aware that if he brings his Greggory Keen to the hospital from home, that it would be administered to him, since this is not on formulary for the hospital.  Initiated naltrexone 50 mg p.o. daily for substance use disorder.  Patient continues on his psychotropic medications and medications for other medical problems without adverse effect.  Expected date of discharge on 07/04/2023. Appetite is fair Concentration is improving Energy level is adequate Denies suicidal thoughts. Denies suicidal intent and plan.  Denies having any HI.  However endorses AH and VH as indicated above.   Principal Problem: MDD (major depressive disorder), recurrent, severe, with psychosis (HCC) Diagnosis: Principal Problem:   MDD (major depressive disorder), recurrent, severe, with psychosis (HCC) Active Problems:   Hyperlipidemia, mixed   TOBACCO USE   CAD - CABG '09, OM3 stent March 2010, last cath 4/13- medical Rx   HTN (hypertension)   Anxiety   Sciatica of right side   Dental caries   Degenerative disc disease at L5-S1 level   Prediabetes   Severe alcohol dependence (HCC)   Severe tetrahydrocannabinol (THC) dependence (HCC)  Total Time spent with patient: 45 minutes  Past Psychiatric History: See H & P  Past Medical History:  Past Medical History:  Diagnosis Date   Anxiety    Arthritis    CHF (congestive heart failure) (HCC)    Complication of anesthesia    difficulty awakening following appendectomy    Dizziness    with humidity    History of laparoscopic appendectomy 09/18/2013   Hx of CABG    Hyperlipidemia    Hypertension    Kidney stones    MYOCARDIAL INFARCTION 10/13/2008   Qualifier: Diagnosis of  By: Juanda Chance, MD, Johny Chess    Numbness in right leg    pt relates to sciatica   Right kidney stone 08/06/2014   Noted incidentally on CT January 2016; ~0.6 cm, non-obstructing    Tobacco user     Past Surgical History:  Procedure Laterality Date   APPENDECTOMY     CARDIAC CATHETERIZATION     CORONARY ARTERY BYPASS GRAFT     status post diaphragmatic wall infarction Rx BMS RCA 03-11-08    CORONARY STENT PLACEMENT     CYSTOSCOPY WITH RETROGRADE PYELOGRAM, URETEROSCOPY AND STENT PLACEMENT Bilateral 03/16/2015   Procedure: CYSTOSCOPY WITH BILATERAL RETROGRADE PYELOGRAM, RIGHT URETEROSCOPY, STONE BASKETRY WITH LASER LITHOTRIPSY ON THE RIGHT,  AND BILATERAL STENT PLACEMENT;  Surgeon: Malen Gauze, MD;  Location: [facility];  Service: Urology;  Laterality: Bilateral;   CYSTOSCOPY WITH RETROGRADE PYELOGRAM, URETEROSCOPY AND STENT PLACEMENT Left 04/02/2015   Procedure: CYSTOSCOPY WITH RETROGRADE PYELOGRAM, URETEROSCOPY REMOVAL BILATERAL STENTS;  Surgeon: Malen Gauze, MD;  Location: [facility];  Service: Urology;  Laterality: Left;   HOLMIUM LASER APPLICATION Right 03/16/2015   Procedure: HOLMIUM LASER APPLICATION;  Surgeon: Malen Gauze, MD;  Location: [facility];  Service: Urology;  Laterality: Right;   LAPAROSCOPIC APPENDECTOMY N/A 09/17/2013   Procedure: APPENDECTOMY LAPAROSCOPIC;  Surgeon: Shelly Rubenstein, MD;  Location: [facility] OR;  Service: General;  Laterality: N/A;   LEFT HEART CATHETERIZATION WITH CORONARY/GRAFT ANGIOGRAM N/A 10/19/2011   Procedure: LEFT HEART CATHETERIZATION WITH Isabel Caprice;  Surgeon: Herby Abraham, MD;  Location: [facility] CATH LAB;  Service: Cardiovascular;  Laterality: N/A;   ORTHOPEDIC SURGERY     TONSILLECTOMY     Family History:  Family History  Adopted: Yes  Problem Relation Age of Onset   Cancer Mother    Diabetes Mother    Hyperlipidemia Mother    Hypertension Mother    Heart failure Mother        Died age 62   Cancer Sister    Diabetes Sister    Hyperlipidemia  Sister    Hypertension Sister    Family Psychiatric  History: See H & P Social History:  Social History   Substance and Sexual Activity  Alcohol Use Yes     Social History   Substance and Sexual Activity  Drug Use Yes   Types: Marijuana    Social History   Socioeconomic History   Marital status: Divorced    Spouse name: Not on file   Number of children: 4   Years of education: Not on file   Highest education level: Not on file  Occupational History   Not on file  Tobacco Use   Smoking status: Every Day    Current packs/day: 0.50    Average packs/day: 0.5 packs/day for 35.0 years (17.5 ttl pk-yrs)    Types: Cigarettes   Smokeless tobacco: Former    Types: Snuff    Quit date: 07/20/1994   Tobacco comments:    5-6 cigs/day  Vaping Use   Vaping status:  Never Used  Substance and Sexual Activity   Alcohol use: Yes   Drug use: Yes    Types: Marijuana   Sexual activity: Not Currently  Other Topics Concern   Not on file  Social History Narrative   Lives with girlfriend and two children.  He has four children.     Social Drivers of Corporate investment banker Strain: Not on file  Food Insecurity: Food Insecurity Present (06/28/2023)   Hunger Vital Sign    Worried About Running Out of Food in the Last Year: Often true    Ran Out of Food in the Last Year: Often true  Transportation Needs: No Transportation Needs (06/28/2023)   PRAPARE - Administrator, Civil Service (Medical): No    Lack of Transportation (Non-Medical): No  Physical Activity: Not on file  Stress: Not on file  Social Connections: Not on file    Sleep: Poor  Appetite:  Fair  Current Medications: Current Facility-Administered Medications  Medication Dose Route Frequency Provider Last Rate Last Admin   acetaminophen (TYLENOL) tablet 650 mg  650 mg Oral Q4H PRN Eligha Bridegroom, NP   650 mg at 07/01/23 1042   alum & mag hydroxide-simeth (MAALOX/MYLANTA) 200-200-20 MG/5ML suspension 30 mL   30 mL Oral Q4H PRN Eligha Bridegroom, NP   30 mL at 06/30/23 1904   atorvastatin (LIPITOR) tablet 80 mg  80 mg Oral Daily Eligha Bridegroom, NP   80 mg at 07/01/23 0739   baclofen (LIORESAL) tablet 10 mg  10 mg Oral TID Eligha Bridegroom, NP   10 mg at 07/01/23 1610   clopidogrel (PLAVIX) tablet 75 mg  75 mg Oral Daily Eligha Bridegroom, NP   75 mg at 07/01/23 0738   DULoxetine (CYMBALTA) DR capsule 90 mg  90 mg Oral Daily NtuenJesusita Oka, FNP   90 mg at 07/01/23 0738   fenofibrate tablet 54 mg  54 mg Oral Daily Eligha Bridegroom, NP   54 mg at 07/01/23 9604   gabapentin (NEURONTIN) capsule 300 mg  300 mg Oral BID Starleen Blue, NP   300 mg at 07/01/23 5409   gabapentin (NEURONTIN) capsule 600 mg  600 mg Oral QHS Eligha Bridegroom, NP   600 mg at 06/30/23 2110   hydrOXYzine (ATARAX) tablet 25 mg  25 mg Oral Q6H PRN Starleen Blue, NP   25 mg at 06/30/23 1117   isosorbide mononitrate (IMDUR) 24 hr tablet 60 mg  60 mg Oral Daily Eligha Bridegroom, NP   60 mg at 07/01/23 8119   loperamide (IMODIUM) capsule 2-4 mg  2-4 mg Oral PRN Starleen Blue, NP       LORazepam (ATIVAN) tablet 1 mg  1 mg Oral Q6H PRN Starleen Blue, NP       LORazepam (ATIVAN) tablet 1 mg  1 mg Oral BID Starleen Blue, NP       Followed by   Melene Muller ON 07/03/2023] LORazepam (ATIVAN) tablet 1 mg  1 mg Oral Daily Nkwenti, Doris, NP       losartan (COZAAR) tablet 100 mg  100 mg Oral Daily Massengill, Nathan, MD   100 mg at 07/01/23 0739   magnesium hydroxide (MILK OF MAGNESIA) suspension 30 mL  30 mL Oral Daily PRN Eligha Bridegroom, NP       metoprolol tartrate (LOPRESSOR) tablet 25 mg  25 mg Oral BID Eligha Bridegroom, NP   25 mg at 07/01/23 0738   multivitamin with minerals tablet 1 tablet  1 tablet Oral Daily  Starleen Blue, NP   1 tablet at 07/01/23 1610   nicotine (NICODERM CQ - dosed in mg/24 hours) patch 21 mg  21 mg Transdermal Daily Starleen Blue, NP   21 mg at 07/01/23 9604   nicotine polacrilex (NICORETTE) gum 2 mg  2 mg Oral PRN  Starleen Blue, NP   2 mg at 07/01/23 0950   OLANZapine (ZYPREXA) injection 10 mg  10 mg Intramuscular TID PRN Eligha Bridegroom, NP       OLANZapine (ZYPREXA) injection 5 mg  5 mg Intramuscular TID PRN Eligha Bridegroom, NP       OLANZapine zydis (ZYPREXA) disintegrating tablet 5 mg  5 mg Oral TID PRN Eligha Bridegroom, NP       ondansetron (ZOFRAN) tablet 4 mg  4 mg Oral Q8H PRN Sindy Guadeloupe, NP   4 mg at 06/30/23 1652   oxyCODONE-acetaminophen (PERCOCET/ROXICET) 5-325 MG per tablet 2 tablet  2 tablet Oral Q8H PRN Eligha Bridegroom, NP   2 tablet at 07/01/23 5409   ranolazine (RANEXA) 12 hr tablet 500 mg  500 mg Oral BID Eligha Bridegroom, NP   500 mg at 07/01/23 8119   thiamine (Vitamin B-1) tablet 100 mg  100 mg Oral Daily Starleen Blue, NP   100 mg at 07/01/23 1478   traZODone (DESYREL) tablet 50 mg  50 mg Oral QHS Starleen Blue, NP   50 mg at 06/30/23 2113   valACYclovir (VALTREX) tablet 1,000 mg  1,000 mg Oral BID Starleen Blue, NP   1,000 mg at 07/01/23 2956    Lab Results:  Results for orders placed or performed during the hospital encounter of 06/28/23 (from the past 48 hours)  Lipid panel     Status: Abnormal   Collection Time: 06/30/23  7:35 PM  Result Value Ref Range   Cholesterol 134 0 - 200 mg/dL   Triglycerides 213 (H) <150 mg/dL   HDL 52 >08 mg/dL   Total CHOL/HDL Ratio 2.6 RATIO   VLDL 52 (H) 0 - 40 mg/dL   LDL Cholesterol 30 0 - 99 mg/dL    Comment:        Total Cholesterol/HDL:CHD Risk Coronary Heart Disease Risk Table                     Men   Women  1/2 Average Risk   3.4   3.3  Average Risk       5.0   4.4  2 X Average Risk   9.6   7.1  3 X Average Risk  23.4   11.0        Use the calculated Patient Ratio above and the CHD Risk Table to determine the patient's CHD Risk.        ATP III CLASSIFICATION (LDL):  <100     mg/dL   Optimal  657-846  mg/dL   Near or Above                    Optimal  130-159  mg/dL   Borderline  962-952  mg/dL   High  >841      mg/dL   Very High Performed at [facility], [address] [place], [place] [address]   Hemoglobin A1c     Status: None   Collection Time: 06/30/23  7:35 PM  Result Value Ref Range   Hgb A1c MFr Bld 5.5 4.8 - 5.6 %    Comment: (NOTE) Pre diabetes:  5.7%-6.4%  Diabetes:              >6.4%  Glycemic control for   <7.0% adults with diabetes    Mean Plasma Glucose 111.15 mg/dL    Comment: Performed at [facility], [address] [place], [place] [address]  TSH     Status: None   Collection Time: 06/30/23  7:36 PM  Result Value Ref Range   TSH 2.905 0.350 - 4.500 uIU/mL    Comment: Performed by a 3rd Generation assay with a functional sensitivity of <=0.01 uIU/mL. Performed at [facility], [address], [place], [place] [address]   Basic metabolic panel     Status: Abnormal   Collection Time: 06/30/23  7:36 PM  Result Value Ref Range   Sodium 133 (L) 135 - 145 mmol/L    Comment: DELTA CHECK NOTED   Potassium 4.4 3.5 - 5.1 mmol/L    Comment: DELTA CHECK NOTED   Chloride 103 98 - 111 mmol/L   CO2 23 22 - 32 mmol/L   Glucose, Bld 117 (H) 70 - 99 mg/dL    Comment: Glucose reference range applies only to samples taken after fasting for at least 8 hours.   BUN 12 6 - 20 mg/dL   Creatinine, Ser 2.13 0.61 - 1.24 mg/dL   Calcium 8.8 (L) 8.9 - 10.3 mg/dL   GFR, Estimated >08 >65 mL/min    Comment: (NOTE) Calculated using the CKD-EPI Creatinine Equation (2021)    Anion gap 7 5 - 15    Comment: Performed at [facility], [address], [place], [place] [address]  VITAMIN D 25 Hydroxy (Vit-D Deficiency, Fractures)     Status: None   Collection Time: 06/30/23  7:36 PM  Result Value Ref Range   Vit D, 25-Hydroxy 42.33 30 - 100 ng/mL    Comment: (NOTE) Vitamin D deficiency has been defined by the Institute of Medicine  and an Endocrine Society practice guideline as a level of serum 25-OH  vitamin D less than  20 ng/mL (1,2). The Endocrine Society went on to  further define vitamin D insufficiency as a level between 21 and 29  ng/mL (2).  1. IOM (Institute of Medicine). 2010. Dietary reference intakes for  calcium and D. [place]: [employer]. 2. Holick MF, Binkley [place], Bischoff-Ferrari HA, et al. Evaluation,  treatment, and prevention of vitamin D deficiency: an Endocrine  Society clinical practice guideline, JCEM. 2011 Jul; 96(7): 1911-30.  Performed at [facility], [address] [place], [place] [address]   Vitamin B12     Status: None   Collection Time: 06/30/23  7:36 PM  Result Value Ref Range   Vitamin B-12 309 180 - 914 pg/mL    Comment: (NOTE) This assay is not validated for testing neonatal or myeloproliferative syndrome specimens for Vitamin B12 levels. Performed at [facility], [address] [place], [place] [address]   Folate     Status: None   Collection Time: 06/30/23  7:36 PM  Result Value Ref Range   Folate 6.5 >5.9 ng/mL    Comment: Performed at [facility], [address] [place] [address] [place], [place] [address]    Blood Alcohol level:  Lab Results  Component Value Date   ETH 227 (H) 06/28/2023   ETH (H) 10/26/2007    250        LOWEST DETECTABLE LIMIT FOR SERUM ALCOHOL IS 11 mg/dL FOR  MEDICAL PURPOSES ONLY    Metabolic Disorder Labs: Lab Results  Component Value Date   HGBA1C 5.5 06/30/2023   MPG 111.15 06/30/2023   MPG 126 07/25/2018   No results found for: "PROLACTIN" Lab Results  Component Value Date   CHOL 134 06/30/2023   TRIG 260 (H) 06/30/2023   HDL 52 06/30/2023   CHOLHDL 2.6 06/30/2023   VLDL 52 (H) 06/30/2023   LDLCALC 30 06/30/2023   LDLCALC 62 08/24/2019   Physical Findings: AIMS:  , ,  ,  ,    CIWA:  CIWA-Ar Total: 2 COWS:     Musculoskeletal: Strength & Muscle Tone: within normal limits Gait & Station: normal Patient leans: N/A  Psychiatric Specialty  Exam:  Presentation  General Appearance:  Casual  Eye Contact: Good  Speech: Clear and Coherent  Speech Volume: Normal  Handedness: Right  Mood and Affect  Mood: Anxious; Depressed  Affect: Congruent  Thought Process  Thought Processes: Coherent  Descriptions of Associations:Intact  Orientation:Full (Time, Place and Person)  Thought Content:Logical  History of Schizophrenia/Schizoaffective disorder:No data recorded Duration of Psychotic Symptoms:No data recorded Hallucinations:Hallucinations: Auditory; Visual Description of Auditory Hallucinations: Hearing some voices telling him to get out and run and telling him to just go.  Reports he is not paying attention to these voices Description of Visual Hallucinations: Seeing some black dots  Ideas of Reference:None  Suicidal Thoughts:Suicidal Thoughts: No SI Passive Intent and/or Plan: -- (Denies)  Homicidal Thoughts:Homicidal Thoughts: No  Sensorium  Memory: Immediate Fair; Recent Fair  Judgment: Fair  Insight: Fair  Executive Functions  Concentration: Good  Attention Span: Good  Recall: Fair  Fund of Knowledge: Fair  Language: Good  Psychomotor Activity  Psychomotor Activity: Psychomotor Activity: Normal  Assets  Assets: Communication Skills; Desire for Improvement; Resilience  Sleep  Sleep: Sleep: Good Number of Hours of Sleep: 7  Physical Exam: Physical Exam Vitals and nursing note reviewed.  HENT:     Head: Normocephalic.     Nose: Nose normal.     Mouth/Throat:     Mouth: Mucous membranes are moist.     Pharynx: Oropharynx is clear.  Eyes:     Extraocular Movements: Extraocular movements intact.  Cardiovascular:     Rate and Rhythm: Normal rate.     Pulses: Normal pulses.     Comments: History of MI with coronary artery bypass grafting x 3 Pulmonary:     Effort: Pulmonary effort is normal.  Abdominal:     Comments: Deferred  Genitourinary:    Comments:  Deferred Musculoskeletal:        General: Normal range of motion.     Cervical back: Normal range of motion.  Skin:    General: Skin is warm.  Neurological:     General: No focal deficit present.     Mental Status: He is alert and oriented to person, place, and time.  Psychiatric:        Mood and Affect: Mood normal.        Behavior: Behavior normal.        Thought Content: Thought content normal.    Review of Systems  Constitutional:  Negative for chills and fever.  HENT:  Negative for sore throat.   Eyes:  Negative for blurred vision.  Respiratory:  Negative for cough, sputum production, shortness of breath and wheezing.   Cardiovascular:  Negative for chest pain and palpitations.       History of MI with coronary artery bypass grafting x 4  Gastrointestinal:  Negative for abdominal pain, constipation, diarrhea, heartburn, nausea and vomiting.  Genitourinary:  Negative for dysuria, frequency and urgency.  Musculoskeletal:  Negative for falls.  Skin:  Negative for itching and rash.  Neurological:  Negative for dizziness, tingling, tremors and headaches.  Endo/Heme/Allergies:        See allergy listing  Psychiatric/Behavioral:  Positive for depression and substance abuse. Negative for hallucinations, memory loss and suicidal ideas. The patient is nervous/anxious and has insomnia.   All other systems reviewed and are negative.  Blood pressure (!) 131/97, pulse 75, temperature 97.7 F (36.5 C), temperature source Oral, resp. rate 18, height 5\' 10"  (1.778 m), weight 83.8 kg, SpO2 100%. Body mass index is 26.52 kg/m.  Treatment Plan Summary: Daily contact with patient to assess and evaluate symptoms and progress in treatment and Medication management   Safety and Monitoring: Voluntary admission to inpatient psychiatric unit for safety, stabilization and treatment Daily contact with patient to assess and evaluate symptoms and progress in treatment Patient's case to be discussed  in multi-disciplinary team meeting Observation Level : q15 minute checks Vital signs: q12 hours Precautions: Safety   Long Term Goal(s): Improvement in symptoms so as ready for discharge   Short Term Goals: Ability to identify changes in lifestyle to reduce recurrence of condition will improve, Ability to verbalize feelings will improve, Ability to disclose and discuss suicidal ideas, Ability to demonstrate self-control will improve, Ability to identify and develop effective coping behaviors will improve, Ability to maintain clinical measurements within normal limits will improve, Compliance with prescribed medications will improve, and Ability to identify triggers associated with substance abuse/mental health issues will improve   Diagnoses Principal Problem:   MDD (major depressive disorder), recurrent, severe, with psychosis (HCC) Active Problems:   Hyperlipidemia, mixed   TOBACCO USE   CAD - CABG '09, OM3 stent March 2010, last cath 4/13- medical Rx   HTN (hypertension)   Anxiety   Sciatica of right side   Dental caries   Degenerative disc disease at L5-S1 level   Prediabetes   Severe alcohol dependence (HCC)   Severe tetrahydrocannabinol (THC) dependence (HCC)   Medications -Increase Cymbalta from 60 mg to 90 mg p.o. daily for MDD/GAD/chronic pain -Continue gabapentin 300 mg BID for EtOH use/GAD/neuropathic pain -Continue gabapentin 600 mg nightly for EtOH use/GAD/neuropathic pain -Continue Ativan detox taper for alcohol use disorder-See the [facility] for details -Continue trazodone 50 mg nightly for sleep   Continue the following medical medications -Atorvastatin 80 mg for hyperlipidemia -Fenofibrate 54 mg daily -Plavix 75 mg daily -Lopressor 25 mg twice daily for hypertension -Ranexa 500 mg twice daily -Continue Valtrex 1000 mg twice daily for genital herpes infection   PRNS -Continue oxycodone-acetaminophen 5/325 2 tabs as needed every 8 hours for pain (home  medication) -Continue agitation protocol medications: Ativan/Benadryl/Zyprexa as needed as per the [facility] -Continue hydroxyzine 25 mg 3 times daily as needed for anxiety -Continue Tylenol 650 mg every 6 hours PRN for mild pain -Continue Maalox 30 mg every 4 hrs PRN for indigestion -Continue Milk of Magnesia as needed every 6 hrs for constipation   Provided with education on all medications including rationales, benefits, and possible side effects, agreeable to trials of all medications.   Labs reviewed 07/01/2023: Ordered lipid panel: Triglyceride 260 elevated, VLDL 52 elevated, otherwise normal.  hemoglobin A1c: 5.5 normal, TSH: 2.905 normal, vitamin D: 42.33 normal, B12: 309 normal.  Repeat BMP: 4.4.  QTc from 2 days ago is 444.   Discharge  Planning: Social work and case management to assist with discharge planning and identification of hospital follow-up needs prior to discharge Estimated LOS: 5-7 days Discharge Concerns: Need to establish a safety plan; Medication compliance and effectiveness Discharge Goals: Return home with outpatient referrals for mental health follow-up including medication management/psychotherapy   I certify that inpatient services furnished can reasonably be expected to improve the patient's condition.    Cecilie Lowers, FNP 07/01/2023, 11:37 AM Patient ID: [name], male   DOB: 1975-10-21, 47 y.o.   MRN: [id number]

## 2023-07-02 DIAGNOSIS — F333 Major depressive disorder, recurrent, severe with psychotic symptoms: Secondary | ICD-10-CM | POA: Diagnosis not present

## 2023-07-02 NOTE — Progress Notes (Signed)
   07/02/23 1000  Psych Admission Type (Psych Patients Only)  Admission Status Involuntary  Psychosocial Assessment  Patient Complaints Substance abuse;Anxiety  Eye Contact Fair  Facial Expression Animated  Affect Anxious  Speech Logical/coherent  Interaction Assertive;Childlike  Motor Activity Fidgety  Appearance/Hygiene Unremarkable  Behavior Characteristics Cooperative  Mood Anxious  Thought Process  Coherency WDL  Content WDL  Delusions None reported or observed  Perception WDL  Hallucination None reported or observed  Judgment Limited  Confusion None  Danger to Self  Current suicidal ideation? Denies  Agreement Not to Harm Self Yes  Description of Agreement verbal  Danger to Others  Danger to Others None reported or observed   Patient alert and oriented. Patient denies SI, HI, AVH and endorses pain 9/10 in his back. Patient on CIWA protocol. PRN tylenol administered. Scheduled medications administered to patient, per provider orders. Support and encouragement provided. Routine safety checks conducted every 15 minutes. Patient verbally contracts for safety and remains safe on the unit.

## 2023-07-02 NOTE — Progress Notes (Signed)
   07/02/23 0531  15 Minute Checks  Location Bedroom  Visual Appearance Calm  Behavior Sleeping  Sleep (Behavioral Health Patients Only)  Calculate sleep? (Click Yes once per 24 hr at 0600 safety check) Yes  Documented sleep last 24 hours 9.25

## 2023-07-02 NOTE — Progress Notes (Addendum)
   07/01/23 2122  Psych Admission Type (Psych Patients Only)  Admission Status Involuntary  Psychosocial Assessment  Patient Complaints Substance abuse;Depression  Eye Contact Fair  Facial Expression Animated  Affect Anxious;Depressed  Speech Logical/coherent  Interaction Assertive;Childlike;Needy  Motor Activity Fidgety  Appearance/Hygiene Unremarkable  Behavior Characteristics Cooperative  Mood Anxious  Thought Process  Coherency WDL  Content WDL  Delusions None reported or observed  Perception WDL  Hallucination None reported or observed  Judgment Limited  Confusion None  Danger to Self  Current suicidal ideation? Denies  Danger to Others  Danger to Others None reported or observed   On assessment, patient presents as anxious and depressed with complaints of pain 10/10.  Patient reports he went to dinner and the gym today.  Per the patient, after gym, his pain started. Patient denies SI/HI/AVH.  Administered PRN Hydroxyzine & Oxycodone per [facility] per patient request.  The patient is safe on the unit with q15 minute safety checks.

## 2023-07-02 NOTE — [facility] Group Notes (Signed)
[facility] Group Notes:  (Nursing/MHT/Case Management/Adjunct)  Date:  07/02/2023  Time:  9:57 PM  Type of Therapy:   wrapup group   Participation Level:  Active  Participation Quality:  Appropriate and Sharing  Affect:  Appropriate  Cognitive:  Alert, Appropriate, and Oriented  Insight:  Appropriate and Good  Engagement in Group:  Engaged  Modes of Intervention:  Discussion  Summary of Progress/Problems: Pt. Shared how his day went well and he is looking forward being discharged soon.  Annell Greening [place] 07/02/2023, 9:57 PM

## 2023-07-02 NOTE — Plan of Care (Signed)

## 2023-07-02 NOTE — Care Management Important Message (Signed)
 Medicare IM printed and given to Derrell Lolling, LCSW to give to the patient.

## 2023-07-02 NOTE — Group Note (Signed)
Recreation Therapy Group Note   Group Topic:Stress Management  Group Date: 07/02/2023 Start Time: 0947 End Time: 1009 Facilitators: Jarmon Javid-McCall, LRT,CTRS Location: [address] Dayroom   Group Topic: Stress Management   Goal Area(s) Addresses:  Patient will actively participate in stress management techniques presented during session.  Patient will successfully identify benefit of practicing stress management post d/c.   Behavioral Response: Appropriate  Intervention: Relaxation exercise with ambient sound and script   Group Description: Guided Imagery. LRT provided education, instruction, and demonstration on practice of visualization via guided imagery. Patient was asked to participate in the technique introduced during session. LRT debriefed including topics of mindfulness, stress management and specific scenarios each patient could use these techniques. Patients were given suggestions of ways to access scripts post d/c and encouraged to explore Youtube and other apps available on smartphones, tablets, and computers.  Education: Stress Management, Discharge Planning.   Education Outcome: Acknowledges education   Affect/Mood: Appropriate   Participation Level: Engaged   Participation Quality: Independent   Behavior: Appropriate   Speech/Thought Process: Focused   Insight: Good   Judgement: Good   Modes of Intervention: Script   Patient Response to Interventions:  Engaged   Education Outcome:  In group clarification offered    Clinical Observations/Individualized Feedback: Patient actively engaged in technique introduced, expressed no concerns and demonstrated ability to practice skill independently post d/c.      Plan: Continue to engage patient in RT group sessions 2-3x/week.   Tamu Golz-McCall, LRT,CTRS  07/02/2023 12:06 PM

## 2023-07-02 NOTE — [facility] Group Notes (Signed)
Adult Psychoeducational Group Note  Date:  07/02/2023 Time:  10:28 AM  Group Topic/Focus:  Goals Group:   The focus of this group is to help patients establish daily goals to achieve during treatment and discuss how the patient can incorporate goal setting into their daily lives to aide in recovery.  Participation Level:  Active  Participation Quality:  Appropriate and Attentive  Affect:  Appropriate  Cognitive:  Alert and Appropriate  Insight: Appropriate  Engagement in Group:  Engaged  Modes of Intervention:  Exploration  Additional Comments:  Pt participated in group. Pt stated their goal is to leave and live his life. Pt did not identify or indorse signs of SI/HI    Burnett Sheng 07/02/2023, 10:28 AM

## 2023-07-02 NOTE — Plan of Care (Signed)
  Problem: Education: Goal: Emotional status will improve Outcome: Not Progressing   Problem: Activity: Goal: Interest or engagement in activities will improve Outcome: Not Progressing   Problem: Coping: Goal: Ability to demonstrate self-control will improve Outcome: Not Progressing

## 2023-07-02 NOTE — Progress Notes (Signed)
[facility] MD Progress Note  07/02/2023 4:36 PM [name]  MRN:  [id number]  HPI: The patient is a 47 year old Caucasian male with prior mental health diagnoses of MDD & GAD who initially presented to the [facility] ER on 12/8 with complaints of SI & HI, asking for help before he does something "dumb", & stated: "my plan is to blow my brains out" as per ER documentation. While in the ER, pt was verbally aggressive, eloped and made it through the ambulance bay to the road prior to being brought back into the ER. BAL was 227, Tox screen positive for THC. He was IVC'd  & transferred to [facility] on 12/9 for treatment and stabilization of his mental status.   24-hour chart review: SBP today in the 137, DBP in the 75.  The rest of vital signs WNL.  Presented with anxiety for the past 24 hours as per nursing documentation, has denied SI/HI.  Patient endorsing AH of hearing voices this morning telling him to get out and run and also to "just go", with visual hallucination of seeing some black dots.  Slept 7.00 hours per nursing documentation, attending unit group sessions per nursing documentation, no behavioral episodes in the past 24 hours PRNs consisted of oxycodone, Nicorette gum.  Daily notes: [name] is seen, chart reviewed. The chart findings discussed with the treatment team. He presents alert, oriented & aware of situation. He is visible on the unit, attending group sessions. He reports, "I'm pretty much doing okay today. I did not eat as much lunch as I thought I would do today. I was not that hungry. I think the combination medicines I'm currently taking is doing me well. This is the best I have ever felt in a long time. I would like to remain on these medicines even after I get discharged from this hospital. All the bad thoughts are gone from my head. I slept for about 6 hours last night. That is unusual for me because I do not sleep well at all. I feel good". Hakan currently denies any SIHI, AVH,  delusional thoughts or paranoia. He does not appear to be responding to any internal stimuli. He feels he is feeling as good as he can be at this point. Says he will be ready to get discharged at any time from now. There are no changes made on his current plan of care. Will continue as already in progress. Vital signs remain stable.  Principal Problem: MDD (major depressive disorder), recurrent, severe, with psychosis (HCC)  Diagnosis: Principal Problem:   MDD (major depressive disorder), recurrent, severe, with psychosis (HCC) Active Problems:   Hyperlipidemia, mixed   TOBACCO USE   CAD - CABG '09, OM3 stent March 2010, last cath 4/13- medical Rx   HTN (hypertension)   Anxiety   Sciatica of right side   Dental caries   Degenerative disc disease at L5-S1 level   Prediabetes   Severe alcohol dependence (HCC)   Severe tetrahydrocannabinol (THC) dependence (HCC)  Total Time spent with patient: 45 minutes  Past Psychiatric History: See H & P  Past Medical History:  Past Medical History:  Diagnosis Date   Anxiety    Arthritis    CHF (congestive heart failure) (HCC)    Complication of anesthesia    difficulty awakening following appendectomy    Dizziness    with humidity    History of laparoscopic appendectomy 09/18/2013   Hx of CABG    Hyperlipidemia    Hypertension  Kidney stones    MYOCARDIAL INFARCTION 10/13/2008   Qualifier: Diagnosis of  By: Juanda Chance, MD, Johny Chess    Numbness in right leg    pt relates to sciatica   Right kidney stone 08/06/2014   Noted incidentally on CT January 2016; ~0.6 cm, non-obstructing    Tobacco user     Past Surgical History:  Procedure Laterality Date   APPENDECTOMY     CARDIAC CATHETERIZATION     CORONARY ARTERY BYPASS GRAFT     status post diaphragmatic wall infarction Rx BMS RCA 03-11-08    CORONARY STENT PLACEMENT     CYSTOSCOPY WITH RETROGRADE PYELOGRAM, URETEROSCOPY AND STENT PLACEMENT Bilateral 03/16/2015   Procedure:  CYSTOSCOPY WITH BILATERAL RETROGRADE PYELOGRAM, RIGHT URETEROSCOPY, STONE BASKETRY WITH LASER LITHOTRIPSY ON THE RIGHT,  AND BILATERAL STENT PLACEMENT;  Surgeon: Malen Gauze, MD;  Location: [facility];  Service: Urology;  Laterality: Bilateral;   CYSTOSCOPY WITH RETROGRADE PYELOGRAM, URETEROSCOPY AND STENT PLACEMENT Left 04/02/2015   Procedure: CYSTOSCOPY WITH RETROGRADE PYELOGRAM, URETEROSCOPY REMOVAL BILATERAL STENTS;  Surgeon: Malen Gauze, MD;  Location: [facility];  Service: Urology;  Laterality: Left;   HOLMIUM LASER APPLICATION Right 03/16/2015   Procedure: HOLMIUM LASER APPLICATION;  Surgeon: Malen Gauze, MD;  Location: [facility];  Service: Urology;  Laterality: Right;   LAPAROSCOPIC APPENDECTOMY N/A 09/17/2013   Procedure: APPENDECTOMY LAPAROSCOPIC;  Surgeon: Shelly Rubenstein, MD;  Location: [facility];  Service: General;  Laterality: N/A;   LEFT HEART CATHETERIZATION WITH CORONARY/GRAFT ANGIOGRAM N/A 10/19/2011   Procedure: LEFT HEART CATHETERIZATION WITH Isabel Caprice;  Surgeon: Herby Abraham, MD;  Location: [facility] CATH LAB;  Service: Cardiovascular;  Laterality: N/A;   ORTHOPEDIC SURGERY     TONSILLECTOMY     Family History:  Family History  Adopted: Yes  Problem Relation Age of Onset   Cancer Mother    Diabetes Mother    Hyperlipidemia Mother    Hypertension Mother    Heart failure Mother        Died age 72   Cancer Sister    Diabetes Sister    Hyperlipidemia Sister    Hypertension Sister    Family Psychiatric  History: See H&P Social History:  Social History   Substance and Sexual Activity  Alcohol Use Yes     Social History   Substance and Sexual Activity  Drug Use Yes   Types: Marijuana    Social History   Socioeconomic History   Marital status: Divorced    Spouse name: Not on file   Number of children: 4   Years of education: Not on file   Highest education level: Not on file  Occupational History   Not on file  Tobacco Use   Smoking  status: Every Day    Current packs/day: 0.50    Average packs/day: 0.5 packs/day for 35.0 years (17.5 ttl pk-yrs)    Types: Cigarettes   Smokeless tobacco: Former    Types: Snuff    Quit date: 07/20/1994   Tobacco comments:    5-6 cigs/day  Vaping Use   Vaping status: Never Used  Substance and Sexual Activity   Alcohol use: Yes   Drug use: Yes    Types: Marijuana   Sexual activity: Not Currently  Other Topics Concern   Not on file  Social History Narrative   Lives with girlfriend and two children.  He has four children.     Social Drivers of Corporate investment banker Strain: Not on  file  Food Insecurity: Food Insecurity Present (06/28/2023)   Hunger Vital Sign    Worried About Running Out of Food in the Last Year: Often true    Ran Out of Food in the Last Year: Often true  Transportation Needs: No Transportation Needs (06/28/2023)   PRAPARE - Administrator, Civil Service (Medical): No    Lack of Transportation (Non-Medical): No  Physical Activity: Not on file  Stress: Not on file  Social Connections: Not on file    Sleep: Poor  Appetite:  Fair  Current Medications: Current Facility-Administered Medications  Medication Dose Route Frequency Provider Last Rate Last Admin   acetaminophen (TYLENOL) tablet 650 mg  650 mg Oral Q4H PRN Eligha Bridegroom, NP   650 mg at 07/02/23 1012   alum & mag hydroxide-simeth (MAALOX/MYLANTA) 200-200-20 MG/5ML suspension 30 mL  30 mL Oral Q4H PRN Eligha Bridegroom, NP   30 mL at 07/01/23 1634   atorvastatin (LIPITOR) tablet 80 mg  80 mg Oral Daily Eligha Bridegroom, NP   80 mg at 07/02/23 0757   baclofen (LIORESAL) tablet 10 mg  10 mg Oral TID Eligha Bridegroom, NP   10 mg at 07/02/23 1209   benzocaine (ORAJEL) 10 % mucosal gel   Mouth/Throat TID PRN Cecilie Lowers, FNP   Given at 07/02/23 1554   clopidogrel (PLAVIX) tablet 75 mg  75 mg Oral Daily Eligha Bridegroom, NP   75 mg at 07/02/23 0758   DULoxetine (CYMBALTA) DR capsule 90 mg   90 mg Oral Daily Ntuen, Jesusita Oka, FNP   90 mg at 07/02/23 0758   fenofibrate tablet 54 mg  54 mg Oral Daily Eligha Bridegroom, NP   54 mg at 07/02/23 0757   gabapentin (NEURONTIN) capsule 300 mg  300 mg Oral BID Starleen Blue, NP   300 mg at 07/02/23 1402   gabapentin (NEURONTIN) capsule 600 mg  600 mg Oral QHS Eligha Bridegroom, NP   600 mg at 07/01/23 2123   isosorbide mononitrate (IMDUR) 24 hr tablet 60 mg  60 mg Oral Daily Eligha Bridegroom, NP   60 mg at 07/02/23 0755   [START ON 07/03/2023] LORazepam (ATIVAN) tablet 1 mg  1 mg Oral Daily Starleen Blue, NP       losartan (COZAAR) tablet 100 mg  100 mg Oral Daily Massengill, Harrold Donath, MD   100 mg at 07/02/23 0755   magnesium hydroxide (MILK OF MAGNESIA) suspension 30 mL  30 mL Oral Daily PRN Eligha Bridegroom, NP       metoprolol tartrate (LOPRESSOR) tablet 25 mg  25 mg Oral BID Eligha Bridegroom, NP   25 mg at 07/02/23 0756   multivitamin with minerals tablet 1 tablet  1 tablet Oral Daily Starleen Blue, NP   1 tablet at 07/02/23 0758   naltrexone (DEPADE) tablet 50 mg  50 mg Oral Daily Ntuen, Tina C, FNP   50 mg at 07/02/23 0757   nicotine (NICODERM CQ - dosed in mg/24 hours) patch 21 mg  21 mg Transdermal Daily Starleen Blue, NP   21 mg at 07/02/23 1610   nicotine polacrilex (NICORETTE) gum 2 mg  2 mg Oral PRN Starleen Blue, NP   2 mg at 07/02/23 1402   OLANZapine (ZYPREXA) injection 10 mg  10 mg Intramuscular TID PRN Eligha Bridegroom, NP       OLANZapine (ZYPREXA) injection 5 mg  5 mg Intramuscular TID PRN Eligha Bridegroom, NP       OLANZapine zydis (ZYPREXA) disintegrating tablet  5 mg  5 mg Oral TID PRN Eligha Bridegroom, NP       ondansetron [facility]) tablet 4 mg  4 mg Oral Q8H PRN Sindy Guadeloupe, NP   4 mg at 07/02/23 1553   oxyCODONE-acetaminophen (PERCOCET/ROXICET) 5-325 MG per tablet 2 tablet  2 tablet Oral Q8H PRN Eligha Bridegroom, NP   2 tablet at 07/02/23 1408   ranolazine (RANEXA) 12 hr tablet 500 mg  500 mg Oral BID Eligha Bridegroom, NP    500 mg at 07/02/23 0756   thiamine (Vitamin B-1) tablet 100 mg  100 mg Oral Daily Starleen Blue, NP   100 mg at 07/02/23 0758   traZODone (DESYREL) tablet 50 mg  50 mg Oral QHS Starleen Blue, NP   50 mg at 07/01/23 2124   valACYclovir (VALTREX) tablet 1,000 mg  1,000 mg Oral BID Starleen Blue, NP   1,000 mg at 07/02/23 1610    Lab Results:  Results for orders placed or performed during the hospital encounter of 06/28/23 (from the past 48 hours)  Lipid panel     Status: Abnormal   Collection Time: 06/30/23  7:35 PM  Result Value Ref Range   Cholesterol 134 0 - 200 mg/dL   Triglycerides 960 (H) <150 mg/dL   HDL 52 >45 mg/dL   Total CHOL/HDL Ratio 2.6 RATIO   VLDL 52 (H) 0 - 40 mg/dL   LDL Cholesterol 30 0 - 99 mg/dL    Comment:        Total Cholesterol/HDL:CHD Risk Coronary Heart Disease Risk Table                     Men   Women  1/2 Average Risk   3.4   3.3  Average Risk       5.0   4.4  2 X Average Risk   9.6   7.1  3 X Average Risk  23.4   11.0        Use the calculated Patient Ratio above and the CHD Risk Table to determine the patient's CHD Risk.        ATP III CLASSIFICATION (LDL):  <100     mg/dL   Optimal  409-811  mg/dL   Near or Above                    Optimal  130-159  mg/dL   Borderline  914-782  mg/dL   High  >956     mg/dL   Very High Performed at [facility], [address] [place], [place] [address]   Hemoglobin A1c     Status: None   Collection Time: 06/30/23  7:35 PM  Result Value Ref Range   Hgb A1c MFr Bld 5.5 4.8 - 5.6 %    Comment: (NOTE) Pre diabetes:          5.7%-6.4%  Diabetes:              >6.4%  Glycemic control for   <7.0% adults with diabetes    Mean Plasma Glucose 111.15 mg/dL    Comment: Performed at [facility], [address] [place], [place] [address]  TSH     Status: None   Collection Time: 06/30/23  7:36 PM  Result Value Ref Range   TSH 2.905 0.350 - 4.500 uIU/mL    Comment: Performed by a  3rd Generation assay with a functional sensitivity of <=0.01 uIU/mL. Performed at [facility], [address], [place], [place] [address]   Basic metabolic panel     Status: Abnormal   Collection Time: 06/30/23  7:36 PM  Result Value Ref Range   Sodium 133 (L) 135 - 145 mmol/L    Comment: DELTA CHECK NOTED   Potassium 4.4 3.5 - 5.1 mmol/L    Comment: DELTA CHECK NOTED   Chloride 103 98 - 111 mmol/L   CO2 23 22 - 32 mmol/L   Glucose, Bld 117 (H) 70 - 99 mg/dL    Comment: Glucose reference range applies only to samples taken after fasting for at least 8 hours.   BUN 12 6 - 20 mg/dL   Creatinine, Ser 2.84 0.61 - 1.24 mg/dL   Calcium 8.8 (L) 8.9 - 10.3 mg/dL   GFR, Estimated >13 >24 mL/min    Comment: (NOTE) Calculated using the CKD-EPI Creatinine Equation (2021)    Anion gap 7 5 - 15    Comment: Performed at [facility], [address] [place], [place] [address]  VITAMIN D 25 Hydroxy (Vit-D Deficiency, Fractures)     Status: None   Collection Time: 06/30/23  7:36 PM  Result Value Ref Range   Vit D, 25-Hydroxy 42.33 30 - 100 ng/mL    Comment: (NOTE) Vitamin D deficiency has been defined by the Institute of Medicine  and an Endocrine Society practice guideline as a level of serum 25-OH  vitamin D less than 20 ng/mL (1,2). The Endocrine Society went on to  further define vitamin D insufficiency as a level between 21 and 29  ng/mL (2).  1. IOM (Institute of Medicine). 2010. Dietary reference intakes for  calcium and D. [place]: [employer]. 2. Holick MF, Binkley [place], Bischoff-Ferrari HA, et al. Evaluation,  treatment, and prevention of vitamin D deficiency: an Endocrine  Society clinical practice guideline, JCEM. 2011 Jul; 96(7): 1911-30.  Performed at [facility], [address], [place], [place] [address]   Vitamin B12     Status: None   Collection Time: 06/30/23  7:36 PM  Result Value Ref Range   Vitamin  B-12 309 180 - 914 pg/mL    Comment: (NOTE) This assay is not validated for testing neonatal or myeloproliferative syndrome specimens for Vitamin B12 levels. Performed at [facility], [address], [place], [place] [address]   Folate     Status: None   Collection Time: 06/30/23  7:36 PM  Result Value Ref Range   Folate 6.5 >5.9 ng/mL    Comment: Performed at [facility], [address] [place], [place] [address]    Blood Alcohol level:  Lab Results  Component Value Date   ETH 227 (H) 06/28/2023   ETH (H) 10/26/2007    250        LOWEST DETECTABLE LIMIT FOR SERUM ALCOHOL IS 11 mg/dL FOR MEDICAL PURPOSES ONLY    Metabolic Disorder Labs: Lab Results  Component Value Date   HGBA1C 5.5 06/30/2023   MPG 111.15 06/30/2023   MPG 126 07/25/2018   No results found for: "PROLACTIN" Lab Results  Component Value Date   CHOL 134 06/30/2023   TRIG 260 (H) 06/30/2023   HDL 52 06/30/2023   CHOLHDL 2.6 06/30/2023   VLDL 52 (H) 06/30/2023   LDLCALC 30 06/30/2023   LDLCALC 62 08/24/2019   Physical Findings: AIMS:  , ,  ,  ,    CIWA:  CIWA-Ar Total: 3 COWS:  Musculoskeletal: Strength & Muscle Tone: within normal limits Gait & Station: normal Patient leans: N/A  Psychiatric Specialty Exam:  Presentation  General Appearance:  Casual  Eye Contact: Good  Speech: Clear and Coherent  Speech Volume: Normal  Handedness: Right  Mood and Affect  Mood: Anxious; Depressed  Affect: Congruent  Thought Process  Thought Processes: Coherent  Descriptions of Associations:Intact  Orientation:Full (Time, Place and Person)  Thought Content:Logical  History of Schizophrenia/Schizoaffective disorder:No data recorded Duration of Psychotic Symptoms:No data recorded Hallucinations:Hallucinations: Auditory; Visual Description of Auditory Hallucinations: Hearing some voices telling him to get out and run and telling him to just  go.  Reports he is not paying attention to these voices Description of Visual Hallucinations: Seeing some black dots  Ideas of Reference:None  Suicidal Thoughts:Suicidal Thoughts: No SI Passive Intent and/or Plan: -- (Denies)  Homicidal Thoughts:Homicidal Thoughts: No  Sensorium  Memory: Immediate Fair; Recent Fair  Judgment: Fair  Insight: Fair  Executive Functions  Concentration: Good  Attention Span: Good  Recall: Fair  Fund of Knowledge: Fair  Language: Good  Psychomotor Activity  Psychomotor Activity: Psychomotor Activity: Normal  Assets  Assets: Communication Skills; Desire for Improvement; Resilience  Sleep  Sleep: Sleep: Good Number of Hours of Sleep: 7  Physical Exam: Physical Exam Vitals and nursing note reviewed.  HENT:     Head: Normocephalic.     Nose: Nose normal.     Mouth/Throat:     Mouth: Mucous membranes are moist.     Pharynx: Oropharynx is clear.  Eyes:     Extraocular Movements: Extraocular movements intact.  Cardiovascular:     Rate and Rhythm: Normal rate.     Pulses: Normal pulses.     Comments: History of MI with coronary artery bypass grafting x 3 Pulmonary:     Effort: Pulmonary effort is normal.  Abdominal:     Comments: Deferred  Genitourinary:    Comments: Deferred Musculoskeletal:        General: Normal range of motion.     Cervical back: Normal range of motion.  Skin:    General: Skin is warm.  Neurological:     General: No focal deficit present.     Mental Status: He is alert and oriented to person, place, and time.  Psychiatric:        Mood and Affect: Mood normal.        Behavior: Behavior normal.        Thought Content: Thought content normal.    Review of Systems  Constitutional:  Negative for chills and fever.  HENT:  Negative for sore throat.   Eyes:  Negative for blurred vision.  Respiratory:  Negative for cough, sputum production, shortness of breath and wheezing.   Cardiovascular:   Negative for chest pain and palpitations.       History of MI with coronary artery bypass grafting x 4  Gastrointestinal:  Negative for abdominal pain, constipation, diarrhea, heartburn, nausea and vomiting.  Genitourinary:  Negative for dysuria, frequency and urgency.  Musculoskeletal:  Negative for falls.  Skin:  Negative for itching and rash.  Neurological:  Negative for dizziness, tingling, tremors and headaches.  Endo/Heme/Allergies:        See allergy listing  Psychiatric/Behavioral:  Positive for depression and substance abuse. Negative for hallucinations, memory loss and suicidal ideas. The patient is nervous/anxious and has insomnia.   All other systems reviewed and are negative.  Blood pressure 115/71, pulse 86, temperature 98.3 F (36.8 C), temperature source Oral,  resp. rate 18, height 5\' 10"  (1.778 m), weight 83.8 kg, SpO2 97%. Body mass index is 26.52 kg/m.  Treatment Plan Summary: Daily contact with patient to assess and evaluate symptoms and progress in treatment and Medication management     Diagnoses Principal Problem:   MDD (major depressive disorder), recurrent, severe, with psychosis (HCC) Active Problems:   Hyperlipidemia, mixed   TOBACCO USE   CAD - CABG '09, OM3 stent March 2010, last cath 4/13- medical Rx   HTN (hypertension)   Anxiety   Sciatica of right side   Dental caries   Degenerative disc disease at L5-S1 level   Prediabetes   Severe alcohol dependence (HCC)   Severe tetrahydrocannabinol (THC) dependence (HCC)   Medications -Continue Cymbalta 90 mg po daily for depression, anxiety/chronic pain -Continue gabapentin 300 mg BID for withdrawal, anxiety/pain. -Continue gabapentin 600 mg po Q hs for withdrawal, anxiety/pain. -Continue the CIWA detox protocols for alcohol withdrawal management. -Continue trazodone 50 mg nightly for sleep   Continue the following medical medications -Atorvastatin 80 mg for hyperlipidemia -Fenofibrate 54 mg daily  for high cholesterol. -Plavix 75 mg daily for PAD. -Lopressor 25 mg twice daily for hypertension -Ranexa 500 mg twice daily for CP -Continue Valtrex 1000 mg twice daily for genital herpes infection   PRNS -Continue oxycodone-acetaminophen 5/325 2 tabs as needed every 8 hours for pain (home medication) -Continue agitation protocol medications: Ativan/Benadryl/Zyprexa as needed as per the [facility] -Continue hydroxyzine 25 mg 3 times daily as needed for anxiety -Continue Tylenol 650 mg every 6 hours PRN for mild pain -Continue Maalox 30 mg every 4 hrs PRN for indigestion -Continue Milk of Magnesia as needed every 6 hrs for constipation   Provided with education on all medications including rationales, benefits, and possible side effects, agreeable to trials of all medications.   Labs reviewed 07/01/2023: Ordered lipid panel: Triglyceride 260 elevated, VLDL 52 elevated, otherwise normal.  hemoglobin A1c: 5.5 normal, TSH: 2.905 normal, vitamin D: 42.33 normal, B12: 309 normal.  Repeat BMP: 4.4.  QTc from 2 days ago is 444.   Discharge Planning: Social work and case management to assist with discharge planning and identification of hospital follow-up needs prior to discharge Estimated LOS: 5-7 days Discharge Concerns: Need to establish a safety plan; Medication compliance and effectiveness Discharge Goals: Return home with outpatient referrals for mental health follow-up including medication management/psychotherapy   I certify that inpatient services furnished can reasonably be expected to improve the patient's condition.    Armandina Stammer, NP, pmhnp, fnp-bc. 07/02/2023, 4:36 PM Patient ID: [name], male   DOB: 1976-04-09, 47 y.o.   MRN: [id number] Patient ID: [name], male   DOB: 1975/07/27, 47 y.o.   MRN: [id number]

## 2023-07-03 ENCOUNTER — Encounter (HOSPITAL_COMMUNITY): Payer: Self-pay | Admitting: Nurse Practitioner

## 2023-07-03 DIAGNOSIS — F1020 Alcohol dependence, uncomplicated: Secondary | ICD-10-CM

## 2023-07-03 DIAGNOSIS — F329 Major depressive disorder, single episode, unspecified: Secondary | ICD-10-CM

## 2023-07-03 DIAGNOSIS — Z8659 Personal history of other mental and behavioral disorders: Secondary | ICD-10-CM

## 2023-07-03 DIAGNOSIS — F1994 Other psychoactive substance use, unspecified with psychoactive substance-induced mood disorder: Secondary | ICD-10-CM

## 2023-07-03 HISTORY — DX: Major depressive disorder, single episode, unspecified: F32.9

## 2023-07-03 HISTORY — DX: Alcohol dependence, uncomplicated: F10.20

## 2023-07-03 HISTORY — DX: Other psychoactive substance use, unspecified with psychoactive substance-induced mood disorder: F19.94

## 2023-07-03 HISTORY — DX: Personal history of other mental and behavioral disorders: Z86.59

## 2023-07-03 MED ORDER — CLOPIDOGREL BISULFATE 75 MG PO TABS: 75.0000 mg | ORAL_TABLET | Freq: Every day | ORAL | 0 refills | Status: AC

## 2023-07-03 MED ORDER — GABAPENTIN 300 MG PO CAPS: 300.0000 mg | ORAL_CAPSULE | Freq: Two times a day (BID) | ORAL | 0 refills | Status: DC

## 2023-07-03 MED ORDER — ATORVASTATIN CALCIUM 80 MG PO TABS: 80.0000 mg | ORAL_TABLET | Freq: Every day | ORAL | 0 refills | Status: AC

## 2023-07-03 MED ORDER — LOSARTAN POTASSIUM 100 MG PO TABS: 100.0000 mg | ORAL_TABLET | Freq: Every day | ORAL | 0 refills | Status: AC

## 2023-07-03 MED ORDER — ISOSORBIDE MONONITRATE ER 60 MG PO TB24: 60.0000 mg | ORAL_TABLET | Freq: Every day | ORAL | 0 refills | Status: AC

## 2023-07-03 MED ORDER — BACLOFEN 10 MG PO TABS: 10.0000 mg | ORAL_TABLET | Freq: Three times a day (TID) | ORAL | 0 refills | Status: AC

## 2023-07-03 MED ORDER — RANOLAZINE ER 500 MG PO TB12: 500.0000 mg | ORAL_TABLET | Freq: Two times a day (BID) | ORAL | 0 refills | Status: AC

## 2023-07-03 MED ORDER — METOPROLOL TARTRATE 25 MG PO TABS: 25.0000 mg | ORAL_TABLET | Freq: Two times a day (BID) | ORAL | 0 refills | Status: AC

## 2023-07-03 MED ORDER — BENZOCAINE 10 % MT GEL: Freq: Three times a day (TID) | OROMUCOSAL | 0 refills | Status: DC | PRN

## 2023-07-03 MED ORDER — VALACYCLOVIR HCL 1 G PO TABS: 1000.0000 mg | ORAL_TABLET | Freq: Two times a day (BID) | ORAL | 0 refills | Status: AC

## 2023-07-03 MED ORDER — DULOXETINE HCL 30 MG PO CPEP: 90.0000 mg | ORAL_CAPSULE | Freq: Every day | ORAL | 0 refills | Status: AC

## 2023-07-03 MED ORDER — FENOFIBRATE 48 MG PO TABS: 48.0000 mg | ORAL_TABLET | Freq: Every day | ORAL | 0 refills | Status: AC

## 2023-07-03 MED ORDER — NICOTINE 21 MG/24HR TD PT24: 21.0000 mg | MEDICATED_PATCH | Freq: Every day | TRANSDERMAL | 0 refills | Status: AC

## 2023-07-03 MED ORDER — OXYCODONE-ACETAMINOPHEN 10-325 MG PO TABS: 1.0000 | ORAL_TABLET | Freq: Three times a day (TID) | ORAL | 0 refills | Status: AC | PRN

## 2023-07-03 MED ORDER — GABAPENTIN 300 MG PO CAPS: 600.0000 mg | ORAL_CAPSULE | Freq: Every day | ORAL | 0 refills | Status: DC

## 2023-07-03 MED ORDER — NALTREXONE HCL 50 MG PO TABS: 50.0000 mg | ORAL_TABLET | Freq: Every day | ORAL | 0 refills | Status: AC

## 2023-07-03 MED ORDER — TRAZODONE HCL 50 MG PO TABS: 50.0000 mg | ORAL_TABLET | Freq: Every day | ORAL | 0 refills | Status: AC

## 2023-07-03 NOTE — Progress Notes (Signed)
  [facility] Adult Case Management Discharge Plan :  Will you be returning to the same living situation after discharge:  Yes,  lives with his middle son At discharge, do you have transportation home?: Yes,  CSW has provided taxi voucher Do you have the ability to pay for your medications: Yes,  has insurance  Release of information consent forms completed and emailed to Medical Records, then turned in to Medical Records by CSW.   Patient to Follow up at:  Follow-up Information     [facility] Go on 07/28/2023.   Why: You have an appointment for medication management services on 07/27/22 at 3:20 pm.. The appointment will be held in person, but you may call to switch to Virtual. Contact information: [address] [place] [address] [phone]         [facility]. Schedule an appointment as soon as possible for a visit.   Why: Please call this provider to personally schedule an appointment for therapy services. Contact information: [address] [place], [place] [address] [phone]         [facility] Follow up.   Why: You may call this provider to schedule an appointment for therapy services. Contact information: [address]  [address] [place] [address] [phone]                 Next level of care provider has access to [facility] Link:no  Safety Planning and Suicide Prevention discussed: Yes,  with Rockie Neighbours [phone]     Has patient been referred to the Quitline?: Patient refused referral for treatment  Patient has been referred for addiction treatment: Yes, referral information given but appointment not made [facility] and [facility], is also familiar with [facility] and may go there because it is close to the bus (list facility).  Lynnell Chad, LCSW 07/03/2023, 9:39 AM

## 2023-07-03 NOTE — Progress Notes (Signed)
Pt did not attend goals group. 

## 2023-07-03 NOTE — [facility] Suicide Risk Assessment (Signed)
[facility] Discharge Suicide Risk Assessment   Principal Problem: MDD (major depressive disorder), recurrent, severe, with psychosis (HCC)  Discharge Diagnoses: Principal Problem:   MDD (major depressive disorder), recurrent, severe, with psychosis (HCC) Active Problems:   Hyperlipidemia, mixed   TOBACCO USE   CAD - CABG '09, OM3 stent March 2010, last cath 4/13- medical Rx   HTN (hypertension)   Anxiety   Sciatica of right side   Dental caries   Degenerative disc disease at L5-S1 level   Prediabetes   Severe alcohol dependence (HCC)   Severe tetrahydrocannabinol (THC) dependence (HCC)      Total Time spent with patient: 40  Musculoskeletal: Strength & Muscle Tone: within normal limits Gait & Station: normal Patient leans: N/A   Psychiatric Specialty Exam:  Presentation  General Appearance: Appropriate for Environment  Eye Contact: Good  Speech: Clear and Coherent  Speech Volume: Normal  Handedness: Right   Mood and Affect  Mood: Euthymic; Anxious  Affect: Full Range   Thought Process  Thought Processes: Linear  Descriptions of Associations: Intact  Orientation: Full (Time, Place and Person)  Thought Content: Logical  History of Schizophrenia/Schizoaffective disorder: No data recorded Duration of Psychotic Symptoms: NA Hallucinations: Hallucinations: None  Ideas of Reference: None  Suicidal Thoughts: Suicidal Thoughts: No  Homicidal Thoughts: Homicidal Thoughts: No   Sensorium  Memory: Immediate Good; Recent Good  Judgment: Fair  Insight: Fair   Art therapist  Concentration: Good  Attention Span: Good  Recall: Good  Fund of Knowledge: Good  Language: Good   Psychomotor Activity  Psychomotor Activity: Psychomotor Activity: Normal   Assets  Assets: Communication Skills; Housing   Sleep  Sleep: Sleep: Good   Physical Exam: General: Sitting comfortably. NAD. HEENT: Normocephalic, atraumatic, MMM, EMOI Lungs: no  increased work of breathing noted Heart: no cyanosis Abdomen: Non distended Musculoskeletal: FROM. No obvious deformities Skin: Warm, dry, intact. No rashes noted Neuro: No obvious focal deficits.  Gait and station are normal  Review of Systems  Constitutional: Negative.   HENT: Negative.    Eyes: Negative.   Respiratory: Negative.    Cardiovascular: Negative.   Gastrointestinal: Negative.   Genitourinary: Negative.   Skin: Negative.   Neurological: Negative.   Psychiatric/Behavioral:  Negative   Mental Status Per Nursing Assessment: NA  Demographic Factors:  Male, Caucasian, Low socioeconomic status, Living alone, and Unemployed  Loss Factors: Decrease in vocational status and Decline in physical health  Historical Factors: Prior suicide attempts and Family history of mental illness or substance abuse  Risk Reduction Factors:   Sense of responsibility to family and Positive social support  Continued Clinical Symptoms:  Previous psychiatric diagnoses and treatments  Cognitive Features That Contribute To Risk:  None  Suicide Risk:  Minimal: No identifiable suicidal ideation.  Patients presenting with no risk factors but with morbid ruminations; may be classified as minimal risk based on the severity of the depressive symptoms.    Follow-up Information     [facility] Go on 07/28/2023.   Why: You have an appointment for medication management services on 07/27/22 at 3:20 pm.. The appointment will be held in person, but you may call to switch to Virtual. Contact information: [address] [place] [address] [phone]         [facility]. Schedule an appointment as soon as possible for a visit.   Why: Please call this provider to personally schedule an appointment for therapy services. Contact information: [address] [place], [place] [address] [phone]         [facility] Follow  up.   Why: You may call this provider to schedule an appointment for therapy services. Contact information: [address]  [address] [place] [address] [phone]                  Plan Of Care/Follow-up recommendations:  Activity: as tolerated  Diet: heart healthy  Other: -Follow-up with your outpatient psychiatric provider -instructions on appointment date, time, and address (location) are provided to you in discharge paperwork.  -Take your psychiatric medications as prescribed at discharge - instructions are provided to you in the discharge paperwork  -Follow-up with outpatient primary care doctor and other specialists -for management of preventative medicine and chronic medical issues  -Testing: Follow-up with outpatient provider for abnormal lab results: NA  -If you are prescribed an atypical antipsychotic medication, we recommend that your outpatient psychiatrist follow routine screening for side effects within 3 months of discharge, including monitoring: AIMS scale, height, weight, blood pressure, fasting lipid panel, HbA1c, and fasting blood sugar.   -Recommend total abstinence from alcohol, tobacco, and other illicit drug use at discharge.   -If your psychiatric symptoms recur, worsen, or if you have side effects to your psychiatric medications, call your outpatient psychiatric provider, 911, [phone] or go to the nearest emergency department.  -If suicidal thoughts occur, immediately call your outpatient psychiatric provider, 911, [phone] or go to the nearest emergency department.   Criss Alvine, MD 07/03/23 9:48 AM

## 2023-07-03 NOTE — Progress Notes (Signed)
Pt discharged at this time. Pt removed all belongings and verbalized understanding of medications and discharge instructions. Pt denies SI/HI/AVH and plans to attend meetings in the community as part of relapse prevention plan.

## 2023-07-03 NOTE — Plan of Care (Signed)
°  Problem: Education: °Goal: Emotional status will improve °Outcome: Progressing °Goal: Mental status will improve °Outcome: Progressing °  °Problem: Activity: °Goal: Interest or engagement in activities will improve °Outcome: Progressing °  °

## 2023-07-03 NOTE — Discharge Summary (Signed)
Physician Discharge Summary Note  Patient:  [name] is a 47 y.o. male  MRN:  [id number]  DOB:  [birth year]  Patient phone: [phone] (home)  Patient address:   [address] [place] [address]   Total Time spent with patient: 34 Minutes  Date of Admission:  06/28/2023  Date of Discharge: 07/03/23   Reason for Admission: Suicidal ideation, Alcohol Abuse, substance induced psychosis  Per NP Nkwenti, " The patient is a 47 year old Caucasian male with prior mental health diagnoses of MDD & GAD who initially presented to the [facility] ER on 12/8 with complaints of SI & HI, asking for help before he does something "dumb", & stated: "my plan is to blow my brains out" as per ER documentation. While in the ER, pt was verbally aggressive, eloped and made it through the ambulance bay to the road prior to being brought back into the ER. BAL was 227, Tox screen positive for THC. He was IVC'd  & transferred to [facility] on 12/9 for treatment and stabilization of his mental status.   Assessment & ROS: During encounter with patient, he minimizes his symptoms of depression, makes statements as above, states that he "was just talking to be talking." He however appears with flushed skin, he has difficulty sitting still & frequently changes spots, he complains of the light being too bright, states that he is craving alcohol, reports being fearful that his body is going to go into withdrawal if he does not get an alcoholic drink, states that he has been drinking alcohol since he was 47 yrs old, states that he does not remember being without it for more than a few days, states that he drinks half a gallon of whiskey daily, states that on halloween, he actually drank two gallons of whiskey. He denies blackouts, denies any history of seizures related to alcohol. Pt verbalizes feeling very anxious, observed to have fine tremors to b/l upper extremities.    He shares that prior to the presentation at the [facility]  ER, he thought that he was having a heart attack, and his son and his GF convinced him to get in the car so that they can take him to the ER for a work up.  He reports that it turns out that he was just having a panic attack.  He reports that he is too fearful to harm himself, states that he made statements stating that he is dying, and that he might as well speed it up, but that he had no intent or plan to harm himself.  He however states that he has guns at home, but that his son took them and locked them up.   Patient reports depressive symptoms which have been persistent, and ongoing for at least the past few months, worsening in the weeks leading to this hospitalization.  He reports insomnia, anhedonia, feelings of guilt, decreased energy levels, poor concentration, decreased appetite levels, psychomotor retardation, poor motivation, as well as feelings of hopelessness, helplessness, worthlessness, which worsened in the days leading to hospitalization.  He reports feeling guilty about his alcohol use, especially drinking while driving, and his fears of hurting other people while intoxicated.  He shares that he had DUIs in the past, states that the last 1 was at least 15 years ago, but states that all of them have been cleared off, and that he no longer drinks and drives.  He also reports guilt regarding "everything", then states by recounting the birth of 2  of his children 1 day apart by 2 different women, at 2 different hospitals, which send his life in a downward spiral.     Patient recounts risk taking behaviors, talks about having a "threesome" with a friend and the a partner during which he sustained a herpes Simplex infection, states that he is currently having a flare, and asking for treatment. He also reports feeling guilty regarding this.  Other than the drinking and driving, and having unprotected sex, he denies other risk-taking behaviors.  Denies other manic type symptoms.   Patient reports  anxiety symptoms, such as excessive worry, feeling restless, feeling on edge, easily fatigued, muscle tension, insomnia, poor concentration levels, which have been ongoing and persistent for at least the past 6 months.  He denies social phobia, denies having special phobias.  Denies OCD type symptoms.  Denies eating disorders.  Denies symptoms significant for borderline personality disorders or other personality disorders.  Denies self-injurious behaviors. Patient reports a history of being bullied while in middle school, but denies physical or sexual abuse in the past or recently.  He denies that the bullying has impacted him in any way, and denies PTSD type symptoms.     He reports panic attacks which occur at least once per week, states that they consist of chest tightness, a choking sensation, rapid heartbeat, chills, fear of dying, trembling, and fear that he is going crazy.   He reports psychosis, states that he has had auditory hallucinations since childhood, states he hears "a bunch of junk", which he elaborates further as "mumbling and whispers", which happens all the time and are persistent.  He reports occasional visual hallucinations consisting of "figures and outlines."  He denies any history of paranoia, and denies first rank symptoms in the past or recently.   Pt with flat affect and depressed mood, attention to personal hygiene and grooming is fair, eye contact is good, speech is clear & coherent. Thought contents are organized and logical, and pt currently denies SI/HI/AVH or paranoia. There is no evidence of delusional thoughts.  He states that the last time that he had auditory hallucinations was earlier today morning."    Principal Problem: MDD (major depressive disorder), recurrent, severe, with psychosis (HCC)  Discharge Diagnoses: Principal Problem:   MDD (major depressive disorder), recurrent, severe, with psychosis (HCC) Active Problems:   Hyperlipidemia, mixed   TOBACCO USE    CAD - CABG '09, OM3 stent March 2010, last cath 4/13- medical Rx   HTN (hypertension)   Anxiety   Sciatica of right side   Dental caries   Degenerative disc disease at L5-S1 level   Prediabetes   Severe alcohol dependence (HCC)   Severe tetrahydrocannabinol (THC) dependence (HCC)     Past Psychiatric (and medical) History: [name]  has a past medical history of Anxiety, Arthritis, CHF (congestive heart failure) (HCC), Complication of anesthesia, Dizziness, H/O psychiatric hospitalization (07/03/2023), History of laparoscopic appendectomy (09/18/2013), CABG, Hyperlipidemia, Hypertension, Kidney stones, Major depressive disorder (07/03/2023), MYOCARDIAL INFARCTION (10/13/2008), Numbness in right leg, Right kidney stone (08/06/2014), Severe alcohol use disorder (HCC) (07/03/2023), Substance induced mood disorder (HCC) (07/03/2023), and Tobacco user.   Previous Psych Diagnoses: MDD Prior inpatient treatment: As per chart review patient was admitted to this hospital on 03/31/2007.  At that time he was brought in by his girlfriend requesting detox for alcohol use because he was binge drinking. Current/prior outpatient treatment: None Prior rehab hx: Denies ever been to rehab, states that he is not interested in  going this time Psychotherapy hx: None History of suicide attempts: Denies History of homicide or aggression: Denies Psychiatric medication history: Reports being on duloxetine in the past, states it caused stomach upset, and he stopped taking it. Psychiatric medication compliance history: Noncompliance Neuromodulation history: None Current Psychiatrist: None at this time Current therapist: None at this time  Past Medical History:  Past Medical History:  Diagnosis Date   Anxiety    Arthritis    CHF (congestive heart failure) (HCC)    Complication of anesthesia    difficulty awakening following appendectomy    Dizziness    with humidity    H/O psychiatric  hospitalization 07/03/2023   History of laparoscopic appendectomy 09/18/2013   Hx of CABG    Hyperlipidemia    Hypertension    Kidney stones    Major depressive disorder 07/03/2023   MYOCARDIAL INFARCTION 10/13/2008   Qualifier: Diagnosis of  By: Juanda Chance, MD, Johny Chess    Numbness in right leg    pt relates to sciatica   Right kidney stone 08/06/2014   Noted incidentally on CT January 2016; ~0.6 cm, non-obstructing    Severe alcohol use disorder (HCC) 07/03/2023   Substance induced mood disorder (HCC) 07/03/2023   Tobacco user      Past Surgical History:  Procedure Laterality Date   APPENDECTOMY     CARDIAC CATHETERIZATION     CORONARY ARTERY BYPASS GRAFT     status post diaphragmatic wall infarction Rx BMS RCA 03-11-08    CORONARY STENT PLACEMENT     CYSTOSCOPY WITH RETROGRADE PYELOGRAM, URETEROSCOPY AND STENT PLACEMENT Bilateral 03/16/2015   Procedure: CYSTOSCOPY WITH BILATERAL RETROGRADE PYELOGRAM, RIGHT URETEROSCOPY, STONE BASKETRY WITH LASER LITHOTRIPSY ON THE RIGHT,  AND BILATERAL STENT PLACEMENT;  Surgeon: Malen Gauze, MD;  Location: [facility];  Service: Urology;  Laterality: Bilateral;   CYSTOSCOPY WITH RETROGRADE PYELOGRAM, URETEROSCOPY AND STENT PLACEMENT Left 04/02/2015   Procedure: CYSTOSCOPY WITH RETROGRADE PYELOGRAM, URETEROSCOPY REMOVAL BILATERAL STENTS;  Surgeon: Malen Gauze, MD;  Location: [facility];  Service: Urology;  Laterality: Left;   HOLMIUM LASER APPLICATION Right 03/16/2015   Procedure: HOLMIUM LASER APPLICATION;  Surgeon: Malen Gauze, MD;  Location: [facility];  Service: Urology;  Laterality: Right;   LAPAROSCOPIC APPENDECTOMY N/A 09/17/2013   Procedure: APPENDECTOMY LAPAROSCOPIC;  Surgeon: Shelly Rubenstein, MD;  Location: [facility] OR;  Service: General;  Laterality: N/A;   LEFT HEART CATHETERIZATION WITH CORONARY/GRAFT ANGIOGRAM N/A 10/19/2011   Procedure: LEFT HEART CATHETERIZATION WITH Isabel Caprice;  Surgeon: Herby Abraham, MD;   Location: [facility] CATH LAB;  Service: Cardiovascular;  Laterality: N/A;   ORTHOPEDIC SURGERY     TONSILLECTOMY       Family History:  Family History  Adopted: Yes  Problem Relation Age of Onset   Cancer Mother    Diabetes Mother    Hyperlipidemia Mother    Hypertension Mother    Heart failure Mother        Died age 3   Cancer Sister    Diabetes Sister    Hyperlipidemia Sister    Hypertension Sister      Family Psychiatric  History:  Medical: Cardiac problems in the family, including hypertension, and CAD.  Also reports a history of cancers in his family. Psych: MDD, GAD, schizophrenia in grandmother, whom he states was seeing a psychiatrist and taking medications for mental health reasons until her death.  Reports that her sister was IVC'd, but he is unsure what mental health conditions she has.  Unsure what medications she takes. Psych Rx: Unsure SA/HA: Yes as per patient.  Reports that paternal uncle committed suicide via heroin overdose, and that he had to help remove him from where he was, denies that this bothered him.  He adds that at the time it bothered him but he has since learned to live with it.  He shares that he was 47 years old at the time. Substance use family hx: Multiple as per patient, does not elaborate.  Social History:  Social History   Substance and Sexual Activity  Alcohol Use Yes     Social History   Substance and Sexual Activity  Drug Use Yes   Types: Marijuana     Social History   Socioeconomic History   Marital status: Divorced    Spouse name: Not on file   Number of children: 4   Years of education: Not on file   Highest education level: Not on file  Occupational History   Not on file  Tobacco Use   Smoking status: Every Day    Current packs/day: 0.50    Average packs/day: 0.5 packs/day for 35.0 years (17.5 ttl pk-yrs)    Types: Cigarettes   Smokeless tobacco: Former    Types: Snuff    Quit date: 07/20/1994   Tobacco comments:    5-6  cigs/day  Vaping Use   Vaping status: Never Used  Substance and Sexual Activity   Alcohol use: Yes   Drug use: Yes    Types: Marijuana   Sexual activity: Not Currently  Other Topics Concern   Not on file  Social History Narrative   Lives with girlfriend and two children.  He has four children.     Social Drivers of Corporate investment banker Strain: Not on file  Food Insecurity: Food Insecurity Present (06/28/2023)   Hunger Vital Sign    Worried About Running Out of Food in the Last Year: Often true    Ran Out of Food in the Last Year: Often true  Transportation Needs: No Transportation Needs (06/28/2023)   PRAPARE - Administrator, Civil Service (Medical): No    Lack of Transportation (Non-Medical): No  Physical Activity: Not on file  Stress: Not on file  Social Connections: Not on file   Patient reports that he was born and raised in [place], [place].  He has 4 children, he states that they are 31, 21, 21, and 47 years old.  He shares that he is divorced, resides with his son who is 2 years old.  Son is supportive.  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  Sexual orientation is heterosexual. Peer Group: None Housing: Owns his own home Finances: Not a stressor as per patient Legal: No legal issues at this time, has clear the DUIs. Military: No   Hospital Course:  During the patient's hospitalization, patient had extensive initial psychiatric evaluation, and follow-up psychiatric evaluations every day.  Psychiatric diagnoses provided upon initial assessment: MDD (major depressive disorder) [F32.9]   The patient was stabilized on a psychiatric medication regimen of Cymbalta which was increased from 60 to 90 mg during the admission, naltrexone 50 mg daily for alcohol use disorder, and trazodone 50 mg nightly for insomnia.  Ativan taper was prescribed for alcohol withdrawal.  The patient's home  medications for chronic medical conditions were continued, and he was prescribed these medications at discharge.  Patient's care was discussed during  the interdisciplinary team meeting every day during the hospitalization.  The patient denied having side effects to prescribed psychiatric medication.  Gradually, patient started adjusting to milieu. The patient was evaluated each day by a clinical provider to ascertain response to treatment. Improvement was noted by the patient's report of decreasing symptoms, improved sleep and appetite, affect, medication tolerance, behavior, and participation in unit programming.  Patient was asked each day to complete a self inventory noting mood, mental status, pain, new symptoms, anxiety and concerns.    Symptoms were reported as significantly decreased or resolved completely by discharge.   On day of discharge, the patient reports that their mood is stable. The patient denied having suicidal thoughts for more than 48 hours prior to discharge.  Patient denies having homicidal thoughts.  Patient denies having auditory hallucinations.  Patient denies any visual hallucinations or other symptoms of psychosis. The patient was motivated to continue taking medication with a goal of continued improvement in mental health.   The patient reports their target psychiatric symptoms of depression anxiety insomnia and alcohol withdrawal responded well to the psychiatric medications, and the patient reports overall benefit other psychiatric hospitalization. Supportive psychotherapy was provided to the patient. The patient also participated in regular group therapy while hospitalized. Coping skills, problem solving as well as relaxation therapies were also part of the unit programming.  Labs were reviewed with the patient, and abnormal results were discussed with the patient.  The patient is able to verbalize their individual safety plan to this provider.    Physical  Findings:  AIMS:  Facial and Oral Movements: None Muscles of Facial Expression: None Lips and Perioral Area: None Jaw: None Tongue: None,Extremity Movements Upper (arms, wrists, hands, fingers): None Lower (legs, knees, ankles, toes): None, Trunk Movements Neck, shoulders, hips: None, Global Judgements Severity of abnormal movements overall: None Incapacitation due to abnormal movements: None Patient's awareness of abnormal movements: No Awareness, Dental Status Current problems with teeth and/or dentures: No Does patient usually wear dentures: No Edentia: No   CIWA:   NA  COWS:  NA  Musculoskeletal: Strength & Muscle Tone: within normal limits Gait & Station: normal Patient leans: N/A    Psychiatric Specialty Exam:  Presentation  General Appearance: Appropriate for Environment  Eye Contact: Good  Speech: Clear and Coherent  Speech Volume: Normal  Handedness: Right   Mood and Affect  Mood: Euthymic; Anxious  Affect: Full Range   Thought Process  Thought Processes: Linear  Descriptions of Associations: Intact  Orientation: Full (Time, Place and Person)  Thought Content: Logical  History of Schizophrenia/Schizoaffective disorder: No data recorded Duration of Psychotic Symptoms: NA Hallucinations: Hallucinations: None  Ideas of Reference: None  Suicidal Thoughts: Suicidal Thoughts: No  Homicidal Thoughts: Homicidal Thoughts: No   Sensorium  Memory: Immediate Good; Recent Good  Judgment: Fair  Insight: Fair   Art therapist  Concentration: Good  Attention Span: Good  Recall: Good  Fund of Knowledge: Good  Language: Good   Psychomotor Activity  Psychomotor Activity: Psychomotor Activity: Normal   Assets  Assets: Communication Skills; Housing   Sleep  Sleep: Sleep: Good      Physical Exam: General: Sitting comfortably. NAD. HEENT: Normocephalic, atraumatic, MMM, EMOI Lungs: no increased work of breathing  noted Heart: no cyanosis Abdomen: Non distended Musculoskeletal: FROM. No obvious deformities Skin: Warm, dry, intact. No rashes noted Neuro: No obvious focal deficits.  Gait and station are normal  Review of Systems:  Constitutional: Negative.   HENT: Negative.  Eyes: Negative.   Respiratory: Negative.    Cardiovascular: Negative.   Gastrointestinal: Negative.   Genitourinary: Negative.   Skin: Negative.   Neurological: Negative.   Psychiatric/Behavioral:  Negative  Blood pressure 128/87, pulse 78, temperature 98.1 F (36.7 C), temperature source Oral, resp. rate 20, height 5\' 10"  (1.778 m), weight 83.8 kg, SpO2 100%. Body mass index is 26.52 kg/m.    Social History   Tobacco Use  Smoking Status Every Day   Current packs/day: 0.50   Average packs/day: 0.5 packs/day for 35.0 years (17.5 ttl pk-yrs)   Types: Cigarettes  Smokeless Tobacco Former   Types: Snuff   Quit date: 07/20/1994  Tobacco Comments   5-6 cigs/day     Tobacco Cessation:  A prescription for an FDA approved medication for tobacco cessation was prescribed at discharge   Blood Alcohol level:  Lab Results  Component Value Date   ETH 227 (H) 06/28/2023   ETH (H) 10/26/2007    250        LOWEST DETECTABLE LIMIT FOR SERUM ALCOHOL IS 11 mg/dL FOR MEDICAL PURPOSES ONLY    Metabolic Disorder Labs:  Lab Results  Component Value Date   HGBA1C 5.5 06/30/2023   MPG 111.15 06/30/2023   MPG 126 07/25/2018   No results found for: "PROLACTIN"  Lab Results  Component Value Date   CHOL 134 06/30/2023   TRIG 260 (H) 06/30/2023   HDL 52 06/30/2023   VLDL 52 (H) 06/30/2023   LDLCALC 30 06/30/2023   LDLCALC 62 08/24/2019      See Psychiatric Specialty Exam and Suicide Risk Assessment completed by Attending Physician prior to discharge.  Discharge destination: Home  Is patient on multiple antipsychotic therapies at discharge:  No  Has Patient had three or more failed trials of antipsychotic  monotherapy by history: NA Recommended Plan for Multiple Antipsychotic Therapies: NA   Discharge Instructions     Diet - low sodium heart healthy   Complete by: As directed    Increase activity slowly   Complete by: As directed         Allergies as of 07/03/2023       Reactions   Celexa [citalopram] Itching, Rash   Codeine Itching        Medication List     TAKE these medications      Indication  atorvastatin 80 MG tablet Commonly known as: LIPITOR Take 1 tablet (80 mg total) by mouth daily. Start taking on: July 04, 2023 What changed: See the new instructions.  Indication: Ischemic Heart Disease   baclofen 10 MG tablet Commonly known as: LIORESAL Take 1 tablet (10 mg total) by mouth 3 (three) times daily.  Indication: Muscle Spasticity   benzocaine 10 % mucosal gel Commonly known as: ORAJEL Use as directed in the mouth or throat 3 (three) times daily as needed for mouth pain.  Indication: oral pain   clopidogrel 75 MG tablet Commonly known as: PLAVIX Take 1 tablet (75 mg total) by mouth daily. Start taking on: July 04, 2023 What changed: when to take this  Indication: Ischemic Heart Disease   DULoxetine 30 MG capsule Commonly known as: Cymbalta Take 3 capsules (90 mg total) by mouth daily. Start taking on: July 04, 2023 What changed:  medication strength how much to take  Indication: Major Depressive Disorder, Musculoskeletal Pain   fenofibrate 48 MG tablet Commonly known as: TRICOR Take 1 tablet (48 mg total) by mouth daily.  Indication: Primary Hypercholesterolemia   gabapentin 300  MG capsule Commonly known as: NEURONTIN Take 1 capsule (300 mg total) by mouth 2 (two) times daily. What changed:  how much to take how to take this when to take this additional instructions  Indication: Abuse or Misuse of Alcohol, Diabetes with Nerve Disease, Generalized Anxiety Disorder   gabapentin 300 MG capsule Commonly known as:  NEURONTIN Take 2 capsules (600 mg total) by mouth at bedtime. What changed: You were already taking a medication with the same name, and this prescription was added. Make sure you understand how and when to take each.  Indication: Spinal Cord Injury   isosorbide mononitrate 60 MG 24 hr tablet Commonly known as: IMDUR Take 1 tablet (60 mg total) by mouth daily. Start taking on: July 04, 2023  Indication: Stable Angina Pectoris   losartan 100 MG tablet Commonly known as: COZAAR Take 1 tablet (100 mg total) by mouth daily.  Indication: High Blood Pressure   metFORMIN 1000 MG tablet Commonly known as: GLUCOPHAGE Take 500 mg by mouth 2 (two) times daily with a meal.  Indication: Type 2 Diabetes   metoprolol tartrate 25 MG tablet Commonly known as: LOPRESSOR Take 1 tablet (25 mg total) by mouth 2 (two) times daily.  Indication: High Blood Pressure   Mounjaro 2.5 MG/0.5ML Pen Generic drug: tirzepatide Inject 2.5 mg into the skin once a week.  Indication: Type 2 Diabetes   MULTI-VITAMIN DAILY PO Take 1 Dose by mouth daily. [employer] vitamin pack takes 1 packet by mouth daily  Indication: Nutrition   naloxone 4 MG/0.1ML Liqd nasal spray kit Commonly known as: NARCAN Spray 1 squirt in each nostril for signs of opioid overdose including unresponsiveness and decreased breathing  Indication: Opioid Overdose   naltrexone 50 MG tablet Commonly known as: DEPADE Take 1 tablet (50 mg total) by mouth daily. Start taking on: July 04, 2023  Indication: Abuse or Misuse of Alcohol   nicotine 21 mg/24hr patch Commonly known as: NICODERM CQ - dosed in mg/24 hours Place 1 patch (21 mg total) onto the skin daily. Start taking on: July 04, 2023  Indication: Nicotine Addiction   nitroGLYCERIN 0.4 MG SL tablet Commonly known as: NITROSTAT Place 1 tablet (0.4 mg total) under the tongue every 5 (five) minutes as needed for chest pain.  Indication: Acute Angina Pectoris    oxyCODONE-acetaminophen 10-325 MG tablet Commonly known as: PERCOCET Take 1 tablet by mouth every 8 (eight) hours as needed for up to 7 days.  Indication: Pain   ranolazine 500 MG 12 hr tablet Commonly known as: RANEXA Take 1 tablet (500 mg total) by mouth 2 (two) times daily.  Indication: Stable Angina Pectoris   traZODone 50 MG tablet Commonly known as: DESYREL Take 1 tablet (50 mg total) by mouth at bedtime for 14 days.  Indication: Trouble Sleeping   valACYclovir 1000 MG tablet Commonly known as: VALTREX Take 1 tablet (1,000 mg total) by mouth 2 (two) times daily for 5 days.  Indication: Herpes Simplex Infection          Follow-up Information     [facility] Go on 07/28/2023.   Why: You have an appointment for medication management services on 07/27/22 at 3:20 pm.. The appointment will be held in person, but you may call to switch to Virtual. Contact information: [address] [place] [address] [phone]         [facility]. Schedule an appointment as soon as possible for a visit.  Why: Please call this provider to personally schedule an appointment for therapy services. Contact information: [address] [place], [place] [address] [phone]         [facility] Follow up.   Why: You may call this provider to schedule an appointment for therapy services. Contact information: [address]  [address] [place] [address] [phone]                    Follow-up recommendations:  - It is recommended to the patient to continue psychiatric medications as prescribed, after discharge from the hospital.   - It is recommended to the patient to follow up with your outpatient psychiatric provider and PCP. - It was discussed with the patient, the impact of alcohol, drugs, tobacco have been there overall psychiatric and medical wellbeing, and total abstinence from substance  use was recommended the patient. - Prescriptions provided or sent directly to preferred pharmacy at discharge. Patient agreeable to plan. Given opportunity to ask questions. Appears to feel comfortable with discharge.   - In the event of worsening symptoms, the patient is instructed to call the crisis hotline, 911 and or go to the nearest ED for appropriate evaluation and treatment of symptoms. To follow-up with primary care provider for other medical issues, concerns and or health care needs - Patient was discharged home with a plan to follow up as noted below.   Comments:  NA  Signed: Criss Alvine, MD 07/03/23 11:36 AM

## 2023-07-03 NOTE — Progress Notes (Signed)
   07/03/23 0556  15 Minute Checks  Location Bedroom  Visual Appearance Calm  Behavior Sleeping  Sleep (Behavioral Health Patients Only)  Calculate sleep? (Click Yes once per 24 hr at 0600 safety check) Yes  Documented sleep last 24 hours 9.25

## 2023-07-03 NOTE — [facility] Suicide Risk Assessment (Signed)
[facility] INPATIENT:  Family/Significant Other Suicide Prevention Education  Suicide Prevention Education:  Education Completed; Kathleene Hazel [phone],  (name of family member/significant other) has been identified by the patient as the family member/significant other with whom the patient will be residing, and identified as the person(s) who will aid the patient in the event of a mental health crisis (suicidal ideations/suicide attempt).    Warden Fillers is patient's HCPOA, has known him since early childhood.  She states that if he would agree to pain management, she believes he would no longer be just waiting to die.  He somehow believes that he deserves the pain he is in from his terminal disease.  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  He has no access to guns.  With written consent from the patient, the family member/significant other has been provided the following suicide prevention education, prior to the and/or following the discharge of the patient.  The suicide prevention education provided includes the following: Suicide risk factors Suicide prevention and interventions National Suicide Hotline telephone number [facility] assessment telephone number [facility] Emergency Assistance 911 [facility] and/or Residential Mobile Crisis Unit telephone number  Request made of family/significant other to: Remove weapons (e.g., guns, rifles, knives), all items previously/currently identified as safety concern.   Remove drugs/medications (over-the-counter, prescriptions, illicit drugs), all items previously/currently identified as a safety concern.  The family member/significant other verbalizes understanding of the suicide prevention education information provided.  The family member/significant other agrees to remove the items of safety concern  listed above.  Carloyn Jaeger Grossman-Orr 07/03/2023, 9:32 AM

## 2023-07-03 NOTE — Progress Notes (Signed)
   07/02/23 2100  Psych Admission Type (Psych Patients Only)  Admission Status Involuntary  Psychosocial Assessment  Patient Complaints Substance abuse;Anxiety  Eye Contact Fair  Facial Expression Animated  Affect Anxious;Depressed  Speech Logical/coherent  Interaction Assertive;Childlike;Needy  Motor Activity Fidgety  Appearance/Hygiene Unremarkable  Behavior Characteristics Cooperative  Mood Anxious  Thought Process  Coherency WDL  Content WDL  Delusions None reported or observed  Perception WDL  Hallucination None reported or observed  Judgment Limited  Confusion None  Danger to Self  Current suicidal ideation? Denies  Danger to Others  Danger to Others None reported or observed

## 2023-07-03 NOTE — Progress Notes (Signed)
   07/03/23 0941  AMBULATORY ONLY: Non-Emergency Transportation Screening  1.  Is the Patient in the clinic and in need of immediate transportation? Yes  SDOH Interventions  Transportation Interventions Taxi Voucher Given   Taxi voucher provided to [address] [place] [address]  Ambrose Mantle, LCSW 07/03/2023, 9:43 AM

## 2023-07-04 LAB — VITAMIN B1: Vitamin B1 (Thiamine): 320.4 nmol/L — ABNORMAL HIGH (ref 66.5–200.0)

## 2023-08-24 ENCOUNTER — Telehealth: Payer: Self-pay | Admitting: Cardiovascular Disease

## 2023-08-24 NOTE — Telephone Encounter (Signed)
    Primary Cardiologist:Christopher Verlin, MD  Chart reviewed as part of pre-operative protocol coverage. Because of [name]'s past medical history and time since last visit, he/she will require a follow-up visit in order to better assess preoperative cardiovascular risk.  Pre-op covering staff: - Please schedule in person office appointment and call patient to inform them. - Please contact requesting surgeon's office via preferred method (i.e, phone, fax) to inform them of need for appointment prior to surgery.  If applicable, this message will also be routed to pharmacy pool and/or primary cardiologist for input on holding anticoagulant/antiplatelet agent as requested below so that this information is available at time of patient's appointment.   Josefa CHRISTELLA Beauvais, NP  08/24/2023, 11:45 AM

## 2023-08-24 NOTE — Telephone Encounter (Signed)
FAX # FOR REQUESTING OFFICE IS: FAX # [phone]

## 2023-08-24 NOTE — Telephone Encounter (Signed)
Pt has NEW PT APPT 09/16/23 with Dr. Clifton James.

## 2023-08-24 NOTE — Telephone Encounter (Signed)
   Pre-operative Risk Assessment    Patient Name: [name]  DOB: 1975-12-06 MRN: [id number]   Date of last office visit: 02/18/2018 Date of next office visit: [id number]  Request for Surgical Clearance    Procedure:  Dental Extraction - Amount of Teeth to be Pulled:  All top teeth removed (around 19)  Date of Surgery:  Clearance TBD                                Surgeon:  Rojelio Daring Surgeon's Group or Practice Name:  [facility] Phone number:  [phone] Fax number:  N/A   Type of Clearance Requested:   - Pharmacy:  Hold Clopidogrel  (Plavix ) 5 days prior   Type of Anesthesia:  Not Indicated   Additional requests/questions:  Please fax a copy of medical clearance to the surgeon's office.  Bonney Pastor Pough   08/24/2023, 11:14 AM

## 2023-09-16 ENCOUNTER — Ambulatory Visit: Payer: Medicare HMO | Admitting: Cardiovascular Disease

## 2023-10-04 ENCOUNTER — Ambulatory Visit: Admitting: Cardiovascular Disease

## 2023-12-06 ENCOUNTER — Ambulatory Visit: Admitting: Cardiovascular Disease

## 2023-12-11 ENCOUNTER — Encounter (HOSPITAL_COMMUNITY): Payer: Self-pay

## 2023-12-11 ENCOUNTER — Inpatient Hospital Stay (HOSPITAL_COMMUNITY)
Admission: EM | Admit: 2023-12-11 | Discharge: 2023-12-16 | DRG: 418 | Discharge status: Against Medical Advice | Attending: Cardiology | Admitting: Cardiology

## 2023-12-11 ENCOUNTER — Other Ambulatory Visit: Payer: Self-pay

## 2023-12-11 ENCOUNTER — Emergency Department (HOSPITAL_COMMUNITY)

## 2023-12-11 DIAGNOSIS — R7989 Other specified abnormal findings of blood chemistry: Secondary | ICD-10-CM | POA: Diagnosis not present

## 2023-12-11 DIAGNOSIS — Z7985 Long-term (current) use of injectable non-insulin antidiabetic drugs: Secondary | ICD-10-CM | POA: Diagnosis not present

## 2023-12-11 DIAGNOSIS — I110 Hypertensive heart disease with heart failure: Secondary | ICD-10-CM | POA: Diagnosis present

## 2023-12-11 DIAGNOSIS — Z5941 Food insecurity: Secondary | ICD-10-CM | POA: Diagnosis not present

## 2023-12-11 DIAGNOSIS — Z951 Presence of aortocoronary bypass graft: Secondary | ICD-10-CM

## 2023-12-11 DIAGNOSIS — Z885 Allergy status to narcotic agent status: Secondary | ICD-10-CM

## 2023-12-11 DIAGNOSIS — I25110 Atherosclerotic heart disease of native coronary artery with unstable angina pectoris: Secondary | ICD-10-CM | POA: Diagnosis present

## 2023-12-11 DIAGNOSIS — Z79899 Other long term (current) drug therapy: Secondary | ICD-10-CM

## 2023-12-11 DIAGNOSIS — I200 Unstable angina: Principal | ICD-10-CM | POA: Diagnosis present

## 2023-12-11 DIAGNOSIS — K819 Cholecystitis, unspecified: Secondary | ICD-10-CM | POA: Diagnosis not present

## 2023-12-11 DIAGNOSIS — I10 Essential (primary) hypertension: Secondary | ICD-10-CM | POA: Diagnosis not present

## 2023-12-11 DIAGNOSIS — Z8249 Family history of ischemic heart disease and other diseases of the circulatory system: Secondary | ICD-10-CM | POA: Diagnosis not present

## 2023-12-11 DIAGNOSIS — Z87442 Personal history of urinary calculi: Secondary | ICD-10-CM

## 2023-12-11 DIAGNOSIS — E119 Type 2 diabetes mellitus without complications: Secondary | ICD-10-CM | POA: Diagnosis present

## 2023-12-11 DIAGNOSIS — R7401 Elevation of levels of liver transaminase levels: Secondary | ICD-10-CM | POA: Diagnosis not present

## 2023-12-11 DIAGNOSIS — R1013 Epigastric pain: Secondary | ICD-10-CM | POA: Diagnosis not present

## 2023-12-11 DIAGNOSIS — I2510 Atherosclerotic heart disease of native coronary artery without angina pectoris: Secondary | ICD-10-CM | POA: Diagnosis not present

## 2023-12-11 DIAGNOSIS — F17210 Nicotine dependence, cigarettes, uncomplicated: Secondary | ICD-10-CM | POA: Diagnosis present

## 2023-12-11 DIAGNOSIS — Z888 Allergy status to other drugs, medicaments and biological substances status: Secondary | ICD-10-CM | POA: Diagnosis not present

## 2023-12-11 DIAGNOSIS — I252 Old myocardial infarction: Secondary | ICD-10-CM | POA: Diagnosis not present

## 2023-12-11 DIAGNOSIS — R933 Abnormal findings on diagnostic imaging of other parts of digestive tract: Secondary | ICD-10-CM | POA: Diagnosis not present

## 2023-12-11 DIAGNOSIS — E876 Hypokalemia: Secondary | ICD-10-CM | POA: Diagnosis present

## 2023-12-11 DIAGNOSIS — K810 Acute cholecystitis: Secondary | ICD-10-CM | POA: Diagnosis present

## 2023-12-11 DIAGNOSIS — Z66 Do not resuscitate: Secondary | ICD-10-CM | POA: Diagnosis present

## 2023-12-11 DIAGNOSIS — Z7984 Long term (current) use of oral hypoglycemic drugs: Secondary | ICD-10-CM

## 2023-12-11 DIAGNOSIS — F32A Depression, unspecified: Secondary | ICD-10-CM | POA: Diagnosis present

## 2023-12-11 DIAGNOSIS — R079 Chest pain, unspecified: Secondary | ICD-10-CM | POA: Diagnosis present

## 2023-12-11 DIAGNOSIS — Z7902 Long term (current) use of antithrombotics/antiplatelets: Secondary | ICD-10-CM

## 2023-12-11 DIAGNOSIS — Z833 Family history of diabetes mellitus: Secondary | ICD-10-CM

## 2023-12-11 DIAGNOSIS — Z955 Presence of coronary angioplasty implant and graft: Secondary | ICD-10-CM

## 2023-12-11 DIAGNOSIS — I509 Heart failure, unspecified: Secondary | ICD-10-CM | POA: Diagnosis not present

## 2023-12-11 DIAGNOSIS — I5020 Unspecified systolic (congestive) heart failure: Secondary | ICD-10-CM | POA: Diagnosis present

## 2023-12-11 DIAGNOSIS — I959 Hypotension, unspecified: Secondary | ICD-10-CM | POA: Diagnosis not present

## 2023-12-11 DIAGNOSIS — R000 Tachycardia, unspecified: Secondary | ICD-10-CM | POA: Diagnosis not present

## 2023-12-11 DIAGNOSIS — Z9049 Acquired absence of other specified parts of digestive tract: Secondary | ICD-10-CM | POA: Diagnosis not present

## 2023-12-11 DIAGNOSIS — E785 Hyperlipidemia, unspecified: Secondary | ICD-10-CM | POA: Diagnosis present

## 2023-12-11 HISTORY — DX: Type 2 diabetes mellitus without complications: E11.9

## 2023-12-11 LAB — CBC
HCT: 38.2 % — ABNORMAL LOW (ref 39.0–52.0)
Hemoglobin: 12.8 g/dL — ABNORMAL LOW (ref 13.0–17.0)
MCH: 31.6 pg (ref 26.0–34.0)
MCHC: 33.5 g/dL (ref 30.0–36.0)
MCV: 94.3 fL (ref 80.0–100.0)
Platelets: 306 10*3/uL (ref 150–400)
RBC: 4.05 MIL/uL — ABNORMAL LOW (ref 4.22–5.81)
RDW: 14.8 % (ref 11.5–15.5)
WBC: 8 10*3/uL (ref 4.0–10.5)
nRBC: 0 % (ref 0.0–0.2)

## 2023-12-11 LAB — TROPONIN I (HIGH SENSITIVITY)
Troponin I (High Sensitivity): 8 ng/L (ref <18)
Troponin I (High Sensitivity): 7 ng/L (ref <18)

## 2023-12-11 LAB — BASIC METABOLIC PANEL WITH GFR
Anion gap: 10 (ref 5–15)
BUN: 14 mg/dL (ref 6–20)
CO2: 23 mmol/L (ref 22–32)
Calcium: 9.2 mg/dL (ref 8.9–10.3)
Chloride: 103 mmol/L (ref 98–111)
Creatinine, Ser: 1.07 mg/dL (ref 0.61–1.24)
GFR, Estimated: >60 mL/min (ref >60)
Glucose, Bld: 124 mg/dL — ABNORMAL HIGH (ref 70–99)
Potassium: 3.9 mmol/L (ref 3.5–5.1)
Sodium: 136 mmol/L (ref 135–145)

## 2023-12-11 LAB — MAGNESIUM: Magnesium: 1.2 mg/dL — ABNORMAL LOW (ref 1.7–2.4)

## 2023-12-11 LAB — BRAIN NATRIURETIC PEPTIDE: B Natriuretic Peptide: 36.8 pg/mL (ref 0.0–100.0)

## 2023-12-11 MED ORDER — GABAPENTIN 300 MG PO CAPS: 300.0000 mg | ORAL_CAPSULE | Freq: Two times a day (BID) | ORAL | Status: DC

## 2023-12-11 MED ORDER — MORPHINE SULFATE (PF) 2 MG/ML IV SOLN
2.0000 mg | INTRAVENOUS | Status: DC | PRN
  Administered 2023-12-11: 2 mg via INTRAVENOUS
  Filled 2023-12-11: qty 1

## 2023-12-11 MED ORDER — ASPIRIN 325 MG PO TABS: 325.0000 mg | ORAL_TABLET | Freq: Once | ORAL | Status: DC

## 2023-12-11 MED ORDER — HEPARIN (PORCINE) 25000 UT/250ML-% IV SOLN
1200.0000 [IU]/h | INTRAVENOUS | Status: DC
  Administered 2023-12-11 – 2023-12-12 (×2): 1000 [IU]/h via INTRAVENOUS
  Filled 2023-12-11 (×2): qty 250

## 2023-12-11 MED ORDER — METOPROLOL TARTRATE 25 MG PO TABS
25.0000 mg | ORAL_TABLET | Freq: Two times a day (BID) | ORAL | Status: DC
  Filled 2023-12-11 (×2): qty 1

## 2023-12-11 MED ORDER — TRAZODONE HCL 50 MG PO TABS
50.0000 mg | ORAL_TABLET | Freq: Every day | ORAL | Status: DC
  Administered 2023-12-11 – 2023-12-15 (×5): 50 mg via ORAL
  Filled 2023-12-11 (×5): qty 1

## 2023-12-11 MED ORDER — ATORVASTATIN CALCIUM 40 MG PO TABS: 40.0000 mg | ORAL_TABLET | Freq: Every day | ORAL | Status: DC

## 2023-12-11 MED ORDER — FENTANYL CITRATE PF 50 MCG/ML IJ SOSY
50.0000 ug | PREFILLED_SYRINGE | Freq: Once | INTRAMUSCULAR | Status: AC
  Administered 2023-12-11: 50 ug via INTRAVENOUS
  Filled 2023-12-11: qty 1

## 2023-12-11 MED ORDER — HEPARIN BOLUS VIA INFUSION
4000.0000 [IU] | Freq: Once | INTRAVENOUS | Status: AC
  Administered 2023-12-11: 4000 [IU] via INTRAVENOUS
  Filled 2023-12-11: qty 4000

## 2023-12-11 MED ORDER — RANOLAZINE ER 500 MG PO TB12
500.0000 mg | ORAL_TABLET | Freq: Two times a day (BID) | ORAL | Status: DC
  Administered 2023-12-12 – 2023-12-16 (×8): 500 mg via ORAL
  Filled 2023-12-11 (×9): qty 1

## 2023-12-11 MED ORDER — ATORVASTATIN CALCIUM 80 MG PO TABS
80.0000 mg | ORAL_TABLET | Freq: Every day | ORAL | Status: DC
  Administered 2023-12-12 – 2023-12-13 (×2): 80 mg via ORAL
  Filled 2023-12-11 (×2): qty 1

## 2023-12-11 MED ORDER — LOSARTAN POTASSIUM 50 MG PO TABS
100.0000 mg | ORAL_TABLET | Freq: Every day | ORAL | Status: DC
  Filled 2023-12-11: qty 2

## 2023-12-11 MED ORDER — NITROGLYCERIN IN D5W 200-5 MCG/ML-% IV SOLN
0.0000 ug/min | INTRAVENOUS | Status: DC
  Administered 2023-12-11 – 2023-12-13 (×2): 10 ug/min via INTRAVENOUS
  Filled 2023-12-11 (×2): qty 250

## 2023-12-11 MED ORDER — MAGNESIUM SULFATE 2 GM/50ML IV SOLN
2.0000 g | Freq: Once | INTRAVENOUS | Status: AC
  Administered 2023-12-11: 2 g via INTRAVENOUS
  Filled 2023-12-11: qty 50

## 2023-12-11 MED ORDER — NITROGLYCERIN 2 % TD OINT
0.5000 [in_us] | TOPICAL_OINTMENT | Freq: Once | TRANSDERMAL | Status: AC
  Administered 2023-12-11: 0.5 [in_us] via TOPICAL
  Filled 2023-12-11: qty 1

## 2023-12-11 MED ORDER — ONDANSETRON HCL 4 MG/2ML IJ SOLN
INTRAMUSCULAR | Status: AC
  Administered 2023-12-11: 4 mg
  Filled 2023-12-11: qty 2

## 2023-12-11 MED ORDER — NICOTINE 21 MG/24HR TD PT24
21.0000 mg | MEDICATED_PATCH | Freq: Every day | TRANSDERMAL | Status: DC
  Administered 2023-12-12 – 2023-12-16 (×5): 21 mg via TRANSDERMAL
  Filled 2023-12-11 (×5): qty 1

## 2023-12-11 MED ORDER — DULOXETINE HCL 60 MG PO CPEP
90.0000 mg | ORAL_CAPSULE | Freq: Every day | ORAL | Status: DC
  Administered 2023-12-12 – 2023-12-16 (×4): 90 mg via ORAL
  Filled 2023-12-11 (×4): qty 1

## 2023-12-11 MED ORDER — ONDANSETRON HCL 4 MG/2ML IJ SOLN
4.0000 mg | Freq: Once | INTRAMUSCULAR | Status: AC
  Administered 2023-12-11: 4 mg via INTRAVENOUS
  Filled 2023-12-11: qty 2

## 2023-12-11 NOTE — Progress Notes (Signed)
 PHARMACY - ANTICOAGULATION CONSULT NOTE  Pharmacy Consult for heparin  Indication: chest pain/ACS  Allergies  Allergen Reactions   Celexa  [Citalopram ] Itching and Rash   Codeine Itching    Patient Measurements: Height: 5\' 10"  (177.8 cm) Weight: 83 kg (182 lb 15.7 oz) (Wt from 06/2023) IBW/kg (Calculated) : 73 HEPARIN  DW (KG): 83  Vital Signs: Temp: 97.4 F (36.3 C) (05/24 1949) Temp Source: Oral (05/24 1949) BP: 105/69 (05/24 1949) Pulse Rate: 94 (05/24 1949)  Labs: Recent Labs    12/11/23 1957  HGB 12.8*  HCT 38.2*  PLT 306  CREATININE 1.07  TROPONINIHS 8    Estimated Creatinine Clearance: 88.1 mL/min (by C-G formula based on SCr of 1.07 mg/dL).  Assessment: 66 yoM presented to ED with chest pain. PMH includes multivessel CABG cardiac catheterization 2013. Pharmacy consulted to dose heparin  for ACS  -No oral AC -Hgb 12.8, plts WNL -Trops 8  Goal of Therapy:  Heparin  level 0.3-0.7 units/ml Monitor platelets by anticoagulation protocol: Yes   Plan:  Give 4000 units bolus x 1 Start heparin  infusion at 1000 units/hr Check anti-Xa level in 6 hours and daily while on heparin  Continue to monitor H&H and platelets  Young Hensen, PharmD, BCPS Clinical Pharmacist 12/11/2023 10:46 PM

## 2023-12-11 NOTE — ED Triage Notes (Signed)
 Patient BIB [facility] from home due to chest pain. Patient states this started 3 hours ago. Patient took 324 aspirin  and 2 nitroglycerin  prior to EMS arrival. Patient has hx of quad bypass, unknown year. Patient states crushing pain central chest non radiating. Patient received 300mL NS, 8mg  of morphine , and 4mg  zofran  en route. A&Ox4.

## 2023-12-11 NOTE — ED Provider Notes (Signed)
 [place] EMERGENCY DEPARTMENT AT [facility] Provider Note   CSN: [id number] Arrival date & time: 12/11/23  1943     History  Chief Complaint  Patient presents with   Chest Pain    [name] is a 48 y.o. male.  48 year old male with past medical history significant for coronary bypass done over 15 years ago presents today for concern of chest pain that started 3 hours ago.  Rates it as severe with some nausea, denies lightheadedness, palpitations, radiation of the pain.  He states he was laying down watching TV when this started.  He states this does feel like his previous MI.  He has not seen his cardiologist since 2012.  Follows with [facility].  The history is provided by the patient. No language interpreter was used.       Home Medications Prior to Admission medications   Medication Sig Start Date End Date Taking? Authorizing Provider  atorvastatin  (LIPITOR ) 80 MG tablet Take 1 tablet (80 mg total) by mouth daily. 07/04/23 08/03/23  Timmothy Foots, MD  benzocaine  (ORAJEL) 10 % mucosal gel Use as directed in the mouth or throat 3 (three) times daily as needed for mouth pain. 07/03/23   Timmothy Foots, MD  DULoxetine  (CYMBALTA ) 30 MG capsule Take 3 capsules (90 mg total) by mouth daily. 07/04/23   Timmothy Foots, MD  fenofibrate  (TRICOR ) 48 MG tablet Take 1 tablet (48 mg total) by mouth daily. 07/03/23   Timmothy Foots, MD  gabapentin  (NEURONTIN ) 300 MG capsule Take 1 capsule (300 mg total) by mouth 2 (two) times daily. 07/03/23   Timmothy Foots, MD  gabapentin  (NEURONTIN ) 300 MG capsule Take 2 capsules (600 mg total) by mouth at bedtime. 07/03/23 08/02/23  Timmothy Foots, MD  isosorbide  mononitrate (IMDUR ) 60 MG 24 hr tablet Take 1 tablet (60 mg total) by mouth daily. 07/04/23   Timmothy Foots, MD  losartan  (COZAAR ) 100 MG tablet Take 1 tablet (100 mg total) by mouth daily. 07/03/23 08/02/23  Timmothy Foots, MD  metFORMIN (GLUCOPHAGE) 1000 MG  tablet Take 500 mg by mouth 2 (two) times daily with a meal. 05/15/23   [provider]  metoprolol  tartrate (LOPRESSOR ) 25 MG tablet Take 1 tablet (25 mg total) by mouth 2 (two) times daily. 07/03/23   Timmothy Foots, MD  MOUNJARO 2.5 MG/0.5ML Pen Inject 2.5 mg into the skin once a week. 06/19/23   [provider]  Multiple Vitamin (MULTI-VITAMIN DAILY PO) Take 1 Dose by mouth daily. [employer] vitamin pack takes 1 packet by mouth daily    [provider]  naloxone  (NARCAN ) nasal spray 4 mg/0.1 mL Spray 1 squirt in each nostril for signs of opioid overdose including unresponsiveness and decreased breathing 05/13/22   Kingsley, Victoria K, DO  naltrexone  (DEPADE) 50 MG tablet Take 1 tablet (50 mg total) by mouth daily. 07/04/23   Timmothy Foots, MD  nicotine  (NICODERM CQ  - DOSED IN MG/24 HOURS) 21 mg/24hr patch Place 1 patch (21 mg total) onto the skin daily. 07/04/23   Timmothy Foots, MD  nitroGLYCERIN  (NITROSTAT ) 0.4 MG SL tablet Place 1 tablet (0.4 mg total) under the tongue every 5 (five) minutes as needed for chest pain. 07/20/19   Newlin, Enobong, MD  ranolazine  (RANEXA ) 500 MG 12 hr tablet Take 1 tablet (500 mg total) by mouth 2 (two) times daily. 07/03/23 08/02/23  Timmothy Foots, MD  traZODone  (DESYREL ) 50 MG tablet  Take 1 tablet (50 mg total) by mouth at bedtime for 14 days. 07/03/23 07/17/23  Timmothy Foots, MD      Allergies    Celexa  [citalopram ] and Codeine    Review of Systems   Review of Systems  Constitutional:  Negative for chills and fever.  Respiratory:  Negative for shortness of breath.   Cardiovascular:  Positive for chest pain.  Gastrointestinal:  Positive for nausea. Negative for vomiting.  Neurological:  Negative for light-headedness.  All other systems reviewed and are negative.   Physical Exam Updated Vital Signs BP 105/69   Pulse 94   Temp (!) 97.4 F (36.3 C) (Oral)   Resp (!) 22   Ht 5\' 10"  (1.778 m)   SpO2 100%   BMI 26.52 kg/m   Physical Exam Vitals and nursing note reviewed.  Constitutional:      General: He is not in acute distress.    Appearance: Normal appearance. He is not ill-appearing.  HENT:     Head: Normocephalic and atraumatic.     Nose: Nose normal.  Eyes:     Conjunctiva/sclera: Conjunctivae normal.  Cardiovascular:     Rate and Rhythm: Normal rate and regular rhythm.  Pulmonary:     Effort: Pulmonary effort is normal. No respiratory distress.     Breath sounds: Normal breath sounds. No wheezing.  Musculoskeletal:        General: No deformity. Normal range of motion.     Cervical back: Normal range of motion.     Right lower leg: No edema.     Left lower leg: No edema.  Skin:    Findings: No rash.  Neurological:     Mental Status: He is alert.     ED Results / Procedures / Treatments   Labs (all labs ordered are listed, but only abnormal results are displayed) Labs Reviewed  BASIC METABOLIC PANEL WITH GFR - Abnormal; Notable for the following components:      Result Value   Glucose, Bld 124 (*)    All other components within normal limits  CBC - Abnormal; Notable for the following components:   RBC 4.05 (*)    Hemoglobin 12.8 (*)    HCT 38.2 (*)    All other components within normal limits  MAGNESIUM  - Abnormal; Notable for the following components:   Magnesium  1.2 (*)    All other components within normal limits  BRAIN NATRIURETIC PEPTIDE  TROPONIN I (HIGH SENSITIVITY)    EKG EKG Interpretation Date/Time:  Saturday Dec 11 2023 19:52:54 EDT Ventricular Rate:  97 PR Interval:  153 QRS Duration:  103 QT Interval:  431 QTC Calculation: 548 R Axis:   26  Text Interpretation: Ectopic atrial rhythm Abnormal R-wave progression, early transition Inferior infarct, old Abnormal lateral Q waves Prolonged QT interval No significant change since last tracing Confirmed by Celesta Coke ([id number]) on 12/11/2023 7:57:20 PM  Radiology DG Chest Portable 1 View Result Date:  12/11/2023 CLINICAL DATA:  Chest pain with cardiac history, vomiting EXAM: PORTABLE CHEST 1 VIEW COMPARISON:  05/13/2022 FINDINGS: Sternotomy and CABG. Stable cardiomediastinal silhouette. No focal consolidation, pleural effusion, or pneumothorax. No displaced rib fractures. IMPRESSION: No active disease. Electronically Signed   By: Rozell Cornet M.D.   On: 12/11/2023 20:29    Procedures .Critical Care  Performed by: Lucina Sabal, PA-C Authorized by: Lucina Sabal, PA-C   Critical care provider statement:    Critical care time (minutes):  30   Critical care was necessary to  treat or prevent imminent or life-threatening deterioration of the following conditions: unstable angina.   Critical care was time spent personally by me on the following activities:  Development of treatment plan with patient or surrogate, discussions with consultants, evaluation of patient's response to treatment, examination of patient, ordering and review of laboratory studies, ordering and review of radiographic studies, ordering and performing treatments and interventions, pulse oximetry, re-evaluation of patient's condition and review of old charts   Care discussed with: admitting provider       Medications Ordered in ED Medications  nitroGLYCERIN  50 mg in dextrose  5 % 250 mL (0.2 mg/mL) infusion (has no administration in time range)  ondansetron  (ZOFRAN ) injection 4 mg (has no administration in time range)  nitroGLYCERIN  (NITROGLYN) 2 % ointment 0.5 inch (0.5 inches Topical Given 12/11/23 2008)  fentaNYL  (SUBLIMAZE ) injection 50 mcg (50 mcg Intravenous Given 12/11/23 2008)  ondansetron  (ZOFRAN ) 4 MG/2ML injection (4 mg  Given 12/11/23 2010)    ED Course/ Medical Decision Making/ A&P                                 Medical Decision Making Amount and/or Complexity of Data Reviewed Labs: ordered. Radiology: ordered.  Risk Prescription drug management. Decision regarding hospitalization.   Medical Decision  Making / ED Course   This patient presents to the ED for concern of chest pain, this involves an extensive number of treatment options, and is a complaint that carries with it a high risk of complications and morbidity.  The differential diagnosis includes ACS, PE, pneumonia, MSK pain, dissection  MDM: 48 year old male presents today for concern of chest pain.  History of CABG but does not follow with cardiology.  Pain started 3 hours ago.  Similar to previous MI.  Does not radiate. Elevated heart score.  CBC without leukocytosis.  Anemia 12.8.  No acute concern.  BMP with glucose 124 otherwise without acute concern.  BNP of 36.  Magnesium  1.2.  Repletion ordered.  Troponin negative.  Chest x-ray without acute cardiopulmonary process.  EKG without acute ischemic change.  Sublingual nitro, Nitropaste, fentanyl  unsuccessful at pain control.  He did have soft blood pressure initially which is why fentanyl  was chosen over morphine .  Ultimately he required nitro drip which alleviated his pain.  Consulted cardiology who evaluated patient and decided to admit to cardiology service.   Additional history obtained: -Additional history obtained from chart review -External records from outside source obtained and reviewed including: Chart review including previous notes, labs, imaging, consultation notes   Lab Tests: -I ordered, reviewed, and interpreted labs.   The pertinent results include:   Labs Reviewed  BASIC METABOLIC PANEL WITH GFR - Abnormal; Notable for the following components:      Result Value   Glucose, Bld 124 (*)    All other components within normal limits  CBC - Abnormal; Notable for the following components:   RBC 4.05 (*)    Hemoglobin 12.8 (*)    HCT 38.2 (*)    All other components within normal limits  MAGNESIUM  - Abnormal; Notable for the following components:   Magnesium  1.2 (*)    All other components within normal limits  BRAIN NATRIURETIC PEPTIDE  RAPID URINE  DRUG SCREEN, HOSP PERFORMED  CBC  HEPARIN  LEVEL (UNFRACTIONATED)  TROPONIN I (HIGH SENSITIVITY)  TROPONIN I (HIGH SENSITIVITY)      EKG  EKG Interpretation Date/Time:  Saturday Dec 11 2023  19:52:54 EDT Ventricular Rate:  97 PR Interval:  153 QRS Duration:  103 QT Interval:  431 QTC Calculation: 548 R Axis:   26  Text Interpretation: Ectopic atrial rhythm Abnormal R-wave progression, early transition Inferior infarct, old Abnormal lateral Q waves Prolonged QT interval No significant change since last tracing Confirmed by Celesta Coke ([id number]) on 12/11/2023 7:57:20 PM         Imaging Studies ordered: I ordered imaging studies including chest x-ray I independently visualized and interpreted imaging. I agree with the radiologist interpretation   Medicines ordered and prescription drug management: Meds ordered this encounter  Medications   nitroGLYCERIN  (NITROGLYN) 2 % ointment 0.5 inch   fentaNYL  (SUBLIMAZE ) injection 50 mcg   ondansetron  (ZOFRAN ) 4 MG/2ML injection    [place], [place] S: cabinet override   nitroGLYCERIN  50 mg in dextrose  5 % 250 mL (0.2 mg/mL) infusion   ondansetron  (ZOFRAN ) injection 4 mg   magnesium  sulfate IVPB 2 g 50 mL   DISCONTD: aspirin  tablet 325 mg   DISCONTD: atorvastatin  (LIPITOR ) tablet 40 mg   atorvastatin  (LIPITOR ) tablet 80 mg   DULoxetine  (CYMBALTA ) DR capsule 90 mg   DISCONTD: gabapentin  (NEURONTIN ) capsule 300 mg   losartan  (COZAAR ) tablet 100 mg   metoprolol  tartrate (LOPRESSOR ) tablet 25 mg   nicotine  (NICODERM CQ  - dosed in mg/24 hours) patch 21 mg   ranolazine  (RANEXA ) 12 hr tablet 500 mg   traZODone  (DESYREL ) tablet 50 mg   heparin  bolus via infusion 4,000 Units   heparin  ADULT infusion 100 units/mL (25000 units/250mL)   morphine  (PF) 2 MG/ML injection 2 mg    -I have reviewed the patients home medicines and have made adjustments as needed  Critical interventions Nitro drip, cardiology consult  Consultations  Obtained: I requested consultation with the cardiology consult,  and discussed lab and imaging findings as well as pertinent plan - they recommend: As above  Reevaluation: After the interventions noted above, I reevaluated the patient and found that they have :improved  Co morbidities that complicate the patient evaluation  Past Medical History:  Diagnosis Date   Anxiety    Arthritis    CHF (congestive heart failure) (HCC)    Complication of anesthesia    difficulty awakening following appendectomy    Dizziness    with humidity    H/O psychiatric hospitalization 07/03/2023   History of laparoscopic appendectomy 09/18/2013   Hx of CABG    Hyperlipidemia    Hypertension    Kidney stones    Major depressive disorder 07/03/2023   MYOCARDIAL INFARCTION 10/13/2008   Qualifier: Diagnosis of  By: Grandville Lax, MD, Elridge Haller    Numbness in right leg    pt relates to sciatica   Right kidney stone 08/06/2014   Noted incidentally on CT January 2016; ~0.6 cm, non-obstructing    Severe alcohol use disorder (HCC) 07/03/2023   Substance induced mood disorder (HCC) 07/03/2023   Tobacco user       Dispostion: Discussed with cardiology who will evaluate patient for admission.   Final Clinical Impression(s) / ED Diagnoses Final diagnoses:  Unstable angina [facility])    Rx / DC Orders ED Discharge Orders     None         Lucina Sabal, PA-C 12/11/23 2317    Kingsley, Victoria K, DO 12/11/23 2346

## 2023-12-11 NOTE — H&P (Addendum)
 Cardiology Admission History and Physical:   Patient ID: DWON SKY MRN: [id number]; DOB: Nov 29, 1975   Admission date: 12/11/2023  Primary Care Provider: Forest Idol, NP Primary Cardiologist: Antoinette Batman, MD  Primary Electrophysiologist:  None   Chief Complaint:  Chest Pain   Patient Profile:   [name] is a 48 y.o. male with a history of multivessel CABG cardiac catheterization 2013 with patent LIMA to LAD with all vein grafts occluded, polysubstance use  History of Present Illness:   Mr. Bellard states three hours prior to his arrival in the ED he developed substernal chest pain. His pain with without radiation and no known aggravating factors. He has not had chest pain for over a year and denies chest pain leading up to this admission. Rated the pain upon presentation 10/10.   When he arrived to the ED, he was given 50 mcg of fentanyl  and 2 tablets of SLG without alleviation. However, after he was placed on nitroglycerin , he had moderate relief of his symptoms. He quit smoking last week. He rarely drinks ETOH. He smokes the occasional marijuana, however he smokes it rarely. He does not use other illicit substances.   Past Medical History:  Diagnosis Date   Anxiety    Arthritis    CHF (congestive heart failure) (HCC)    Complication of anesthesia    difficulty awakening following appendectomy    Dizziness    with humidity    H/O psychiatric hospitalization 07/03/2023   History of laparoscopic appendectomy 09/18/2013   Hx of CABG    Hyperlipidemia    Hypertension    Kidney stones    Major depressive disorder 07/03/2023   MYOCARDIAL INFARCTION 10/13/2008   Qualifier: Diagnosis of  By: Grandville Lax, MD, Elridge Haller    Numbness in right leg    pt relates to sciatica   Right kidney stone 08/06/2014   Noted incidentally on CT January 2016; ~0.6 cm, non-obstructing    Severe alcohol use disorder (HCC) 07/03/2023   Substance induced mood disorder (HCC)  07/03/2023   Tobacco user     Past Surgical History:  Procedure Laterality Date   APPENDECTOMY     CARDIAC CATHETERIZATION     CORONARY ARTERY BYPASS GRAFT     status post diaphragmatic wall infarction Rx BMS RCA 03-11-08    CORONARY STENT PLACEMENT     CYSTOSCOPY WITH RETROGRADE PYELOGRAM, URETEROSCOPY AND STENT PLACEMENT Bilateral 03/16/2015   Procedure: CYSTOSCOPY WITH BILATERAL RETROGRADE PYELOGRAM, RIGHT URETEROSCOPY, STONE BASKETRY WITH LASER LITHOTRIPSY ON THE RIGHT,  AND BILATERAL STENT PLACEMENT;  Surgeon: Marco Severs, MD;  Location: [facility];  Service: Urology;  Laterality: Bilateral;   CYSTOSCOPY WITH RETROGRADE PYELOGRAM, URETEROSCOPY AND STENT PLACEMENT Left 04/02/2015   Procedure: CYSTOSCOPY WITH RETROGRADE PYELOGRAM, URETEROSCOPY REMOVAL BILATERAL STENTS;  Surgeon: Marco Severs, MD;  Location: [facility];  Service: Urology;  Laterality: Left;   HOLMIUM LASER APPLICATION Right 03/16/2015   Procedure: HOLMIUM LASER APPLICATION;  Surgeon: Marco Severs, MD;  Location: [facility];  Service: Urology;  Laterality: Right;   LAPAROSCOPIC APPENDECTOMY N/A 09/17/2013   Procedure: APPENDECTOMY LAPAROSCOPIC;  Surgeon: Rogena Class, MD;  Location: [facility];  Service: General;  Laterality: N/A;   LEFT HEART CATHETERIZATION WITH CORONARY/GRAFT ANGIOGRAM N/A 10/19/2011   Procedure: LEFT HEART CATHETERIZATION WITH Estella Helling;  Surgeon: Kristopher Pheasant, MD;  Location: [facility] CATH [facility];  Service: Cardiovascular;  Laterality: N/A;   ORTHOPEDIC SURGERY     TONSILLECTOMY  Medications Prior to Admission: Prior to Admission medications   Medication Sig Start Date End Date Taking? Authorizing Provider  atorvastatin  (LIPITOR ) 80 MG tablet Take 1 tablet (80 mg total) by mouth daily. 07/04/23 08/03/23  Timmothy Foots, MD  benzocaine  (ORAJEL) 10 % mucosal gel Use as directed in the mouth or throat 3 (three) times daily as needed for mouth pain. 07/03/23   Timmothy Foots, MD   DULoxetine  (CYMBALTA ) 30 MG capsule Take 3 capsules (90 mg total) by mouth daily. 07/04/23   Timmothy Foots, MD  fenofibrate  (TRICOR ) 48 MG tablet Take 1 tablet (48 mg total) by mouth daily. 07/03/23   Timmothy Foots, MD  gabapentin  (NEURONTIN ) 300 MG capsule Take 1 capsule (300 mg total) by mouth 2 (two) times daily. 07/03/23   Timmothy Foots, MD  gabapentin  (NEURONTIN ) 300 MG capsule Take 2 capsules (600 mg total) by mouth at bedtime. 07/03/23 08/02/23  Timmothy Foots, MD  isosorbide  mononitrate (IMDUR ) 60 MG 24 hr tablet Take 1 tablet (60 mg total) by mouth daily. 07/04/23   Timmothy Foots, MD  losartan  (COZAAR ) 100 MG tablet Take 1 tablet (100 mg total) by mouth daily. 07/03/23 08/02/23  Timmothy Foots, MD  metFORMIN (GLUCOPHAGE) 1000 MG tablet Take 500 mg by mouth 2 (two) times daily with a meal. 05/15/23   [provider]  metoprolol  tartrate (LOPRESSOR ) 25 MG tablet Take 1 tablet (25 mg total) by mouth 2 (two) times daily. 07/03/23   Timmothy Foots, MD  MOUNJARO 2.5 MG/0.5ML Pen Inject 2.5 mg into the skin once a week. 06/19/23   [provider]  Multiple Vitamin (MULTI-VITAMIN DAILY PO) Take 1 Dose by mouth daily. [employer] vitamin pack takes 1 packet by mouth daily    [provider]  naloxone  (NARCAN ) nasal spray 4 mg/0.1 mL Spray 1 squirt in each nostril for signs of opioid overdose including unresponsiveness and decreased breathing 05/13/22   Nora Beal, Victoria K, DO  naltrexone  (DEPADE) 50 MG tablet Take 1 tablet (50 mg total) by mouth daily. 07/04/23   Timmothy Foots, MD  nicotine  (NICODERM CQ  - DOSED IN MG/24 HOURS) 21 mg/24hr patch Place 1 patch (21 mg total) onto the skin daily. 07/04/23   Timmothy Foots, MD  nitroGLYCERIN  (NITROSTAT ) 0.4 MG SL tablet Place 1 tablet (0.4 mg total) under the tongue every 5 (five) minutes as needed for chest pain. 07/20/19   Newlin, Enobong, MD  ranolazine  (RANEXA ) 500 MG 12 hr tablet Take 1 tablet (500 mg total) by mouth 2  (two) times daily. 07/03/23 08/02/23  Timmothy Foots, MD  traZODone  (DESYREL ) 50 MG tablet Take 1 tablet (50 mg total) by mouth at bedtime for 14 days. 07/03/23 07/17/23  Timmothy Foots, MD     Allergies:    Allergies  Allergen Reactions   Celexa  [Citalopram ] Itching and Rash   Codeine Itching    Social History:   Social History   Socioeconomic History   Marital status: Divorced    Spouse name: Not on file   Number of children: 4   Years of education: Not on file   Highest education level: Not on file  Occupational History   Not on file  Tobacco Use   Smoking status: Every Day    Current packs/day: 0.50    Average packs/day: 0.5 packs/day for 35.0 years (17.5 ttl pk-yrs)    Types: Cigarettes   Smokeless tobacco: Former    Types: Snuff  Quit date: 07/20/1994   Tobacco comments:    5-6 cigs/day  Vaping Use   Vaping status: Never Used  Substance and Sexual Activity   Alcohol use: Yes   Drug use: Yes    Types: Marijuana   Sexual activity: Not Currently  Other Topics Concern   Not on file  Social History Narrative   Lives with girlfriend and two children.  He has four children.     Social Drivers of Corporate investment banker Strain: Not on file  Food Insecurity: Food Insecurity Present (06/28/2023)   Hunger Vital Sign    Worried About Running Out of Food in the Last Year: Often true    Ran Out of Food in the Last Year: Often true  Transportation Needs: No Transportation Needs (06/28/2023)   PRAPARE - Administrator, Civil Service (Medical): No    Lack of Transportation (Non-Medical): No  Physical Activity: Not on file  Stress: Not on file  Social Connections: Not on file  Intimate Partner Violence: Not At Risk (06/28/2023)   Humiliation, Afraid, Rape, and Kick questionnaire    Fear of Current or Ex-Partner: No    Emotionally Abused: No    Physically Abused: No    Sexually Abused: No    Family History:   The patient's family history includes  Cancer in his mother and sister; Diabetes in his mother and sister; Heart failure in his mother; Hyperlipidemia in his mother and sister; Hypertension in his mother and sister. He was adopted.    Review of Systems: [y] = yes, [ ]  = no    General: Weight gain [ ] ; Weight loss [ ] ; Anorexia [ ] ; Fatigue [ ] ; Fever [ ] ; Chills [ ] ; Weakness [ ]   Cardiac: Chest pain/pressure [ y]; Resting SOB [ ] ; Exertional SOB [ ] ; Orthopnea [ ] ; Pedal Edema [ ] ; Palpitations [ ] ; Syncope [ ] ; Presyncope [ ] ; Paroxysmal nocturnal dyspnea[ ]   Pulmonary: Cough [ ] ; Wheezing[ ] ; Hemoptysis[ ] ; Sputum [ ] ; Snoring [ ]   GI: Vomiting[ ] ; Dysphagia[ ] ; Melena[ ] ; Hematochezia [ ] ; Heartburn[ ] ; Abdominal pain [ ] ; Constipation [ ] ; Diarrhea [ ] ; BRBPR [ ]   GU: Hematuria[ ] ; Dysuria [ ] ; Nocturia[ ]   Vascular: Pain in legs with walking [ ] ; Pain in feet with lying flat [ ] ; Non-healing sores [ ] ; Stroke [ ] ; TIA [ ] ; Slurred speech [ ] ;  Neuro: Headaches[ ] ; Vertigo[ ] ; Seizures[ ] ; Paresthesias[ ] ;Blurred vision [ ] ; Diplopia [ ] ; Vision changes [ ]   Ortho/Skin: Arthritis [ ] ; Joint pain [ ] ; Muscle pain [ ] ; Joint swelling [ ] ; Back Pain [ ] ; Rash [ ]   Psych: Depression[ ] ; Anxiety[ ]   Heme: Bleeding problems [ ] ; Clotting disorders [ ] ; Anemia [ ]   Endocrine: Diabetes [ ] ; Thyroid  dysfunction[ ]   Physical Exam/Data:   Vitals:   12/11/23 1945 12/11/23 1949 12/11/23 1950  BP:  105/69   Pulse:  94   Resp:  (!) 22   Temp:  (!) 97.4 F (36.3 C)   TempSrc:  Oral   SpO2: 100% 100%   Height:   5\' 10"  (1.778 m)    Intake/Output Summary (Last 24 hours) at 12/11/2023 2203 Last data filed at 12/11/2023 2203 Gross per 24 hour  Intake 54.07 ml  Output --  Net 54.07 ml   There were no vitals filed for this visit. Body mass index is 26.52 kg/m.   General:  Well nourished, well  developed, in no acute distress HEENT: normal Lymph: no adenopathy Neck: no JVD Endocrine:  No thyromegaly Cardiac:  normal S1, S2; RRR;  no murmur  Lungs:  clear to auscultation bilaterally, no wheezing, rhonchi or rales  Abd: soft, nontender, no hepatomegaly  Ext: no edema Musculoskeletal:  No deformities, BUE and BLE strength normal and equal Skin: warm and dry  Neuro:  CNs 2-12 intact, no focal abnormalities noted Psych:  Normal affect    EKG: EKG 12/11/2023 19:52 Ectopic atrial rhythm at a heart rate of 97 bpm. Normal axis. Prolonged QTc. Prior inferior and lateral infarct. Right atrial enlargement.  No T wave inversions no acute ST segment abnormalities. Personally reviewed.   Relevant CV Studies:  2013 Coronary Angiogram 1.  Preserved overall LV function 2.  History of continued tobacco use 3.  Patent IMA to the LAD with a kink that does not appear flow limiting 4.  Patent stent to the OM3 5.  Severe disease of the smaller ramus which has been previously bypassed with an occluded graft 6.  Disease of the PLA 1. Laboratory Data:  Chemistry Recent Labs  Lab 12/11/23 1957  NA 136  K 3.9  CL 103  CO2 23  GLUCOSE 124*  BUN 14  CREATININE 1.07  CALCIUM  9.2  GFRNONAA >60  ANIONGAP 10    No results for input(s): "PROT", "ALBUMIN", "AST", "ALT", "ALKPHOS", "BILITOT" in the last 168 hours. Hematology Recent Labs  Lab 12/11/23 1957  WBC 8.0  RBC 4.05*  HGB 12.8*  HCT 38.2*  MCV 94.3  MCH 31.6  MCHC 33.5  RDW 14.8  PLT 306   Cardiac EnzymesNo results for input(s): "TROPONINI" in the last 168 hours. No results for input(s): "TROPIPOC" in the last 168 hours.  BNP Recent Labs  Lab 12/11/23 1957  BNP 36.8    DDimer No results for input(s): "DDIMER" in the last 168 hours.  Radiology/Studies:  DG Chest Portable 1 View Result Date: 12/11/2023 CLINICAL DATA:  Chest pain with cardiac history, vomiting EXAM: PORTABLE CHEST 1 VIEW COMPARISON:  05/13/2022 FINDINGS: Sternotomy and CABG. Stable cardiomediastinal silhouette. No focal consolidation, pleural effusion, or pneumothorax. No displaced rib  fractures. IMPRESSION: No active disease. Electronically Signed   By: Rozell Cornet M.D.   On: 12/11/2023 20:29    Assessment and Plan:   Mr. Eichenberger is a 48 year old with a history of CAD s/p multivessel CABG who presents to the ED for chest pain. He has a history of occlusion of his SVG and a kink in his LIMA, but with patent flow when imaged last in 2013. He does not have troponin elevation, rather his symptoms in the setting of his history are consistent with unstable angina.   Unstable Angina  - Nitroglycerin  infusion, up titrate to pain free.  - Heparin  gtt with bolus  -ASA -Atorvastatin  40 mg  - Continue metoprolol  25 mg  2. Depression  -Continue duloxetine  30 mg   3. Hypertension  - Continue losartan    4. Diabetes  - Hold mounjaro 2.5   5. Smoking Cessation  - Continue nicotine  patch   Severity of Illness: The appropriate patient status for this patient is INPATIENT. Inpatient status is judged to be reasonable and necessary in order to provide the required intensity of service to ensure the patient's safety. The patient's presenting symptoms, physical exam findings, and initial radiographic and laboratory data in the context of their chronic comorbidities is felt to place them at high risk for further clinical  deterioration. Furthermore, it is not anticipated that the patient will be medically stable for discharge from the hospital within 2 midnights of admission.   * I certify that at the point of admission it is my clinical judgment that the patient will require inpatient hospital care spanning beyond 2 midnights from the point of admission due to high intensity of service, high risk for further deterioration and high frequency of surveillance required.*   For questions or updates, please contact [facility] Please consult [email or web address] for contact info under        Signed, Renelda Carry, MD  12/11/2023 10:03 PM

## 2023-12-12 DIAGNOSIS — I200 Unstable angina: Secondary | ICD-10-CM | POA: Diagnosis not present

## 2023-12-12 DIAGNOSIS — I10 Essential (primary) hypertension: Secondary | ICD-10-CM | POA: Diagnosis not present

## 2023-12-12 DIAGNOSIS — I959 Hypotension, unspecified: Secondary | ICD-10-CM

## 2023-12-12 LAB — HEPATIC FUNCTION PANEL
ALT: 242 U/L — ABNORMAL HIGH (ref 0–44)
AST: 265 U/L — ABNORMAL HIGH (ref 15–41)
Albumin: 2.8 g/dL — ABNORMAL LOW (ref 3.5–5.0)
Alkaline Phosphatase: 157 U/L — ABNORMAL HIGH (ref 38–126)
Bilirubin, Direct: 2.1 mg/dL — ABNORMAL HIGH (ref 0.0–0.2)
Indirect Bilirubin: 1 mg/dL — ABNORMAL HIGH (ref 0.3–0.9)
Total Bilirubin: 3.1 mg/dL — ABNORMAL HIGH (ref 0.0–1.2)
Total Protein: 5.9 g/dL — ABNORMAL LOW (ref 6.5–8.1)

## 2023-12-12 LAB — CBC
HCT: 39.5 % (ref 39.0–52.0)
Hemoglobin: 13.3 g/dL (ref 13.0–17.0)
MCH: 31.3 pg (ref 26.0–34.0)
MCHC: 33.7 g/dL (ref 30.0–36.0)
MCV: 92.9 fL (ref 80.0–100.0)
Platelets: 290 10*3/uL (ref 150–400)
RBC: 4.25 MIL/uL (ref 4.22–5.81)
RDW: 14.8 % (ref 11.5–15.5)
WBC: 12.8 10*3/uL — ABNORMAL HIGH (ref 4.0–10.5)
nRBC: 0 % (ref 0.0–0.2)

## 2023-12-12 LAB — HEPARIN LEVEL (UNFRACTIONATED)
Heparin Unfractionated: 0.5 [IU]/mL (ref 0.30–0.70)
Heparin Unfractionated: 0.38 [IU]/mL (ref 0.30–0.70)

## 2023-12-12 LAB — LACTIC ACID, PLASMA: Lactic Acid, Venous: 1.3 mmol/L (ref 0.5–1.9)

## 2023-12-12 LAB — TROPONIN I (HIGH SENSITIVITY): Troponin I (High Sensitivity): 9 ng/L (ref <18)

## 2023-12-12 MED ORDER — SODIUM CHLORIDE 0.9 % NICU IV INFUSION SIMPLE
500.0000 mL | INJECTION | Freq: Once | INTRAVENOUS | Status: AC
  Administered 2023-12-12: 500 mL via INTRAVENOUS
  Filled 2023-12-12: qty 500

## 2023-12-12 MED ORDER — MORPHINE SULFATE 15 MG PO TABS
15.0000 mg | ORAL_TABLET | ORAL | Status: DC | PRN
  Administered 2023-12-13 – 2023-12-16 (×16): 15 mg via ORAL
  Filled 2023-12-12 (×16): qty 1

## 2023-12-12 MED ORDER — MORPHINE SULFATE (PF) 2 MG/ML IV SOLN
2.0000 mg | INTRAVENOUS | Status: AC | PRN
  Administered 2023-12-12 (×2): 2 mg via INTRAVENOUS
  Filled 2023-12-12 (×2): qty 1

## 2023-12-12 MED ORDER — ACETAMINOPHEN 325 MG PO TABS
650.0000 mg | ORAL_TABLET | Freq: Four times a day (QID) | ORAL | Status: DC | PRN
Start: 2023-12-12 — End: 2023-12-12
  Administered 2023-12-12 (×3): 650 mg via ORAL
  Filled 2023-12-12 (×3): qty 2

## 2023-12-12 MED ORDER — MORPHINE SULFATE (PF) 2 MG/ML IV SOLN
2.0000 mg | INTRAVENOUS | Status: AC | PRN
  Administered 2023-12-12 – 2023-12-13 (×3): 2 mg via INTRAVENOUS
  Filled 2023-12-12 (×3): qty 1

## 2023-12-12 MED ORDER — SODIUM CHLORIDE 0.9 % IV BOLUS: 500.0000 mL | Freq: Once | INTRAVENOUS | Status: AC

## 2023-12-12 MED ORDER — LOSARTAN POTASSIUM 50 MG PO TABS
50.0000 mg | ORAL_TABLET | Freq: Every day | ORAL | Status: DC
  Administered 2023-12-13: 50 mg via ORAL
  Filled 2023-12-12: qty 1

## 2023-12-12 NOTE — Progress Notes (Signed)
 Progress Note  Patient Name: [name] Date of Encounter: 12/12/2023  Primary Cardiologist: Antoinette Batman, MD   Subjective   No chest pain or sob. Amazingly not too dizzy despite his bp.   Inpatient Medications    Scheduled Meds:  atorvastatin   80 mg Oral Daily   DULoxetine   90 mg Oral Daily   losartan   100 mg Oral Daily   metoprolol  tartrate  25 mg Oral BID   nicotine   21 mg Transdermal Daily   ranolazine   500 mg Oral BID   traZODone   50 mg Oral QHS   Continuous Infusions:  heparin  1,000 Units/hr (12/11/23 2351)   nitroGLYCERIN  Stopped (12/12/23 0811)   PRN Meds: acetaminophen , morphine  injection   Vital Signs    Vitals:   12/12/23 0300 12/12/23 0400 12/12/23 0521 12/12/23 0710  BP: (!) 91/58 (!) 88/56 (!) 79/60 (!) 83/67  Pulse: 79 81 94 81  Resp:   20 19  Temp:   97.6 F (36.4 C) (!) 97.5 F (36.4 C)  TempSrc:   Oral Oral  SpO2: 100% 98% 99% 100%  Weight:      Height:        Intake/Output Summary (Last 24 hours) at 12/12/2023 0902 Last data filed at 12/12/2023 0520 Gross per 24 hour  Intake 71.2 ml  Output 900 ml  Net -828.8 ml   Filed Weights   12/11/23 2200 12/11/23 2330  Weight: 83 kg 72.7 kg    Telemetry    nsr - Personally Reviewed  ECG    Low atrial rhythm with no acute STT changes. - Personally Reviewed  Physical Exam   GEN: No acute distress.   Neck: No JVD Cardiac: RRR, no murmurs, rubs, or gallops.  Respiratory: Clear to auscultation bilaterally. GI: Soft, nontender, non-distended  MS: No edema; No deformity. Neuro:  Nonfocal  Psych: Normal affect   Labs    Chemistry Recent Labs  Lab 12/11/23 1957  NA 136  K 3.9  CL 103  CO2 23  GLUCOSE 124*  BUN 14  CREATININE 1.07  CALCIUM  9.2  GFRNONAA >60  ANIONGAP 10     Hematology Recent Labs  Lab 12/11/23 1957  WBC 8.0  RBC 4.05*  HGB 12.8*  HCT 38.2*  MCV 94.3  MCH 31.6  MCHC 33.5  RDW 14.8  PLT 306    Cardiac EnzymesNo results for  input(s): "TROPONINI" in the last 168 hours. No results for input(s): "TROPIPOC" in the last 168 hours.   BNP Recent Labs  Lab 12/11/23 1957  BNP 36.8     DDimer No results for input(s): "DDIMER" in the last 168 hours.   Radiology    DG Chest Portable 1 View Result Date: 12/11/2023 CLINICAL DATA:  Chest pain with cardiac history, vomiting EXAM: PORTABLE CHEST 1 VIEW COMPARISON:  05/13/2022 FINDINGS: Sternotomy and CABG. Stable cardiomediastinal silhouette. No focal consolidation, pleural effusion, or pneumothorax. No displaced rib fractures. IMPRESSION: No active disease. Electronically Signed   By: Rozell Cornet M.D.   On: 12/11/2023 20:29    Cardiac Studies   none  Patient Profile     48 y.o. male admitted with USA , now hypotensive on NTG.  Assessment & Plan    USA  - he is pain free. Continue IV heparin  today. Consider stopping tomorrow. Need to review old cath films. He will likely need another left heart cath. Hypotension - his bp is 75 but he feels well and is lying comfortably in bed. Will give a little  IV fluid.  HTN - his bp is now low. Will hold morning meds.  For questions or updates, please contact [facility] Please consult [email or web address] for contact info under Cardiology/STEMI.      Signed, Manya Sells, MD  12/12/2023, 9:02 AM

## 2023-12-12 NOTE — Progress Notes (Addendum)
 PHARMACY - ANTICOAGULATION CONSULT NOTE  Pharmacy Consult for heparin  Indication: chest pain/ACS  Allergies  Allergen Reactions   Celexa  [Citalopram ] Itching and Rash   Codeine Itching    Patient Measurements: Height: 5\' 10"  (177.8 cm) Weight: 72.7 kg (160 lb 3.2 oz) IBW/kg (Calculated) : 73 HEPARIN  DW (KG): 72.7  Vital Signs: Temp: 97.5 F (36.4 C) (05/25 0710) Temp Source: Oral (05/25 0710) BP: 83/67 (05/25 0710) Pulse Rate: 81 (05/25 0710)  Labs: Recent Labs    12/11/23 1957 12/11/23 2152 12/12/23 0901  HGB 12.8*  --  13.3  HCT 38.2*  --  39.5  PLT 306  --  290  HEPARINUNFRC  --   --  0.50  CREATININE 1.07  --   --   TROPONINIHS 8 7  --     Estimated Creatinine Clearance: 87.8 mL/min (by C-G formula based on SCr of 1.07 mg/dL).  Assessment: 85 yoM presented to ED with chest pain. PMH includes multivessel CABG cardiac catheterization 2013. Pharmacy consulted to dose heparin  for ACS  Heparin  level therapeutic at 0.5 on 1000 units/hr. Hgb and plt stable. No signs of bleeding noted. May consider stopping heparin  tomorrow.   Goal of Therapy:  Heparin  level 0.3-0.7 units/ml Monitor platelets by anticoagulation protocol: Yes   Plan:  Continue heparin  at 1000 units/hr  Confirmatory heparin  level in 6 hours Daily heparin  level and CBC  Monitor for s/sx of bleeding   Thank you for involving pharmacy in the patient's care.   Barbra Boone, PharmD PGY1 Acute Care Pharmacy Resident  12/12/2023 10:15 AM  Addendum  Repeat level is also therapeutic. Continue current rate  Ivery Marking, PharmD, BCIDP, AAHIVP, CPP Infectious Disease Pharmacist 12/12/2023 5:11 PM

## 2023-12-13 ENCOUNTER — Inpatient Hospital Stay (HOSPITAL_COMMUNITY)

## 2023-12-13 DIAGNOSIS — R1013 Epigastric pain: Secondary | ICD-10-CM | POA: Diagnosis not present

## 2023-12-13 DIAGNOSIS — R7401 Elevation of levels of liver transaminase levels: Secondary | ICD-10-CM

## 2023-12-13 DIAGNOSIS — R000 Tachycardia, unspecified: Secondary | ICD-10-CM

## 2023-12-13 DIAGNOSIS — I200 Unstable angina: Secondary | ICD-10-CM | POA: Diagnosis not present

## 2023-12-13 DIAGNOSIS — I959 Hypotension, unspecified: Secondary | ICD-10-CM | POA: Diagnosis not present

## 2023-12-13 DIAGNOSIS — K810 Acute cholecystitis: Secondary | ICD-10-CM | POA: Insufficient documentation

## 2023-12-13 LAB — COMPREHENSIVE METABOLIC PANEL WITH GFR
ALT: 220 U/L — ABNORMAL HIGH (ref 0–44)
AST: 208 U/L — ABNORMAL HIGH (ref 15–41)
Albumin: 2.8 g/dL — ABNORMAL LOW (ref 3.5–5.0)
Alkaline Phosphatase: 145 U/L — ABNORMAL HIGH (ref 38–126)
Anion gap: 4 — ABNORMAL LOW (ref 5–15)
BUN: 16 mg/dL (ref 6–20)
CO2: 24 mmol/L (ref 22–32)
Calcium: 8.3 mg/dL — ABNORMAL LOW (ref 8.9–10.3)
Chloride: 106 mmol/L (ref 98–111)
Creatinine, Ser: 0.93 mg/dL (ref 0.61–1.24)
GFR, Estimated: >60 mL/min (ref >60)
Glucose, Bld: 120 mg/dL — ABNORMAL HIGH (ref 70–99)
Potassium: 3.7 mmol/L (ref 3.5–5.1)
Sodium: 134 mmol/L — ABNORMAL LOW (ref 135–145)
Total Bilirubin: 3.3 mg/dL — ABNORMAL HIGH (ref 0.0–1.2)
Total Protein: 5.8 g/dL — ABNORMAL LOW (ref 6.5–8.1)

## 2023-12-13 LAB — CBC
HCT: 32.8 % — ABNORMAL LOW (ref 39.0–52.0)
HCT: 37.7 % — ABNORMAL LOW (ref 39.0–52.0)
Hemoglobin: 10.9 g/dL — ABNORMAL LOW (ref 13.0–17.0)
Hemoglobin: 13.1 g/dL (ref 13.0–17.0)
MCH: 30.5 pg (ref 26.0–34.0)
MCH: 31.5 pg (ref 26.0–34.0)
MCHC: 33.2 g/dL (ref 30.0–36.0)
MCHC: 34.7 g/dL (ref 30.0–36.0)
MCV: 91.9 fL (ref 80.0–100.0)
MCV: 90.6 fL (ref 80.0–100.0)
Platelets: 230 10*3/uL (ref 150–400)
Platelets: 239 10*3/uL (ref 150–400)
RBC: 3.57 MIL/uL — ABNORMAL LOW (ref 4.22–5.81)
RBC: 4.16 MIL/uL — ABNORMAL LOW (ref 4.22–5.81)
RDW: 14.9 % (ref 11.5–15.5)
RDW: 14.7 % (ref 11.5–15.5)
WBC: 9.3 10*3/uL (ref 4.0–10.5)
WBC: 12.3 10*3/uL — ABNORMAL HIGH (ref 4.0–10.5)
nRBC: 0 % (ref 0.0–0.2)
nRBC: 0 % (ref 0.0–0.2)

## 2023-12-13 LAB — GLUCOSE, CAPILLARY
Glucose-Capillary: 134 mg/dL — ABNORMAL HIGH (ref 70–99)
Glucose-Capillary: 115 mg/dL — ABNORMAL HIGH (ref 70–99)
Glucose-Capillary: 149 mg/dL — ABNORMAL HIGH (ref 70–99)

## 2023-12-13 LAB — CREATININE, SERUM
Creatinine, Ser: 0.86 mg/dL (ref 0.61–1.24)
GFR, Estimated: >60 mL/min (ref >60)

## 2023-12-13 LAB — HEPARIN LEVEL (UNFRACTIONATED): Heparin Unfractionated: 0.15 [IU]/mL — ABNORMAL LOW (ref 0.30–0.70)

## 2023-12-13 LAB — LIPASE, BLOOD: Lipase: 44 U/L (ref 11–51)

## 2023-12-13 MED ORDER — HEPARIN SODIUM (PORCINE) 5000 UNIT/ML IJ SOLN
5000.0000 [IU] | Freq: Three times a day (TID) | INTRAMUSCULAR | Status: DC
  Administered 2023-12-13 – 2023-12-16 (×8): 5000 [IU] via SUBCUTANEOUS
  Filled 2023-12-13 (×8): qty 1

## 2023-12-13 MED ORDER — IOHEXOL 350 MG/ML SOLN
75.0000 mL | Freq: Once | INTRAVENOUS | Status: AC | PRN
  Administered 2023-12-13: 75 mL via INTRAVENOUS

## 2023-12-13 MED ORDER — HEPARIN BOLUS VIA INFUSION
2000.0000 [IU] | Freq: Once | INTRAVENOUS | Status: AC
  Administered 2023-12-13: 2000 [IU] via INTRAVENOUS
  Filled 2023-12-13: qty 2000

## 2023-12-13 MED ORDER — GADOBUTROL 1 MMOL/ML IV SOLN
7.0000 mL | Freq: Once | INTRAVENOUS | Status: AC | PRN
  Administered 2023-12-13: 7 mL via INTRAVENOUS

## 2023-12-13 MED ORDER — SODIUM CHLORIDE 0.9 % IV SOLN
1.0000 g | Freq: Two times a day (BID) | INTRAVENOUS | Status: DC
  Administered 2023-12-13 (×2): 1 g via INTRAVENOUS
  Filled 2023-12-13 (×2): qty 10

## 2023-12-13 MED ORDER — SODIUM CHLORIDE 0.9 % IV BOLUS
1000.0000 mL | Freq: Once | INTRAVENOUS | Status: AC
  Administered 2023-12-13: 1000 mL via INTRAVENOUS

## 2023-12-13 NOTE — Consult Note (Signed)
 [name] 03/03/1976  [id number].    Requesting MD: Alda Amas Chief Complaint/Reason for Consult: ?Cholecystitis  HPI:  48 y/o M w/ a hx of polysubstance use and CAD s/p CABG with vein grafts occluded s/p stenting (on Plavix ) who presented to the ED with substernal chest pain that started earlier in the day. The initial concern was for ACS and he was started on a nitroglycerin  drip.  Labs were notable for AST/ALT in the 200s, Alk phos 145, Tbili 3.3, WBC 12. He underwent a CT that showed signs of cholecystitis with biliary ductal dilation up to 12mm.   On exam, patient is resting in bed.  NAD.  He reports ongoing upper abdominal pain.    Last dose of Plavix  was prior to admission on 5/24. He denies recent drug use and only drinks occasionally.   Surgical history include appendectomy.   ROS: Review of Systems  Constitutional: Negative.   HENT: Negative.    Eyes: Negative.   Respiratory: Negative.    Cardiovascular: Negative.   Gastrointestinal:  Positive for abdominal pain.  Genitourinary: Negative.   Musculoskeletal: Negative.   Skin: Negative.   Neurological: Negative.   Endo/Heme/Allergies: Negative.   Psychiatric/Behavioral: Negative.      Family History  Adopted: Yes  Problem Relation Age of Onset   Cancer Mother    Diabetes Mother    Hyperlipidemia Mother    Hypertension Mother    Heart failure Mother        Died age 71   Cancer Sister    Diabetes Sister    Hyperlipidemia Sister    Hypertension Sister     Past Medical History:  Diagnosis Date   Anxiety    Arthritis    CHF (congestive heart failure) (HCC)    Complication of anesthesia    difficulty awakening following appendectomy    Dizziness    with humidity    H/O psychiatric hospitalization 07/03/2023   History of laparoscopic appendectomy 09/18/2013   Hx of CABG    Hyperlipidemia    Hypertension    Kidney stones    Major depressive disorder 07/03/2023   MYOCARDIAL INFARCTION  10/13/2008   Qualifier: Diagnosis of  By: Grandville Lax, MD, Elridge Haller    Numbness in right leg    pt relates to sciatica   Right kidney stone 08/06/2014   Noted incidentally on CT January 2016; ~0.6 cm, non-obstructing    Severe alcohol use disorder (HCC) 07/03/2023   Substance induced mood disorder (HCC) 07/03/2023   Tobacco user     Past Surgical History:  Procedure Laterality Date   APPENDECTOMY     CARDIAC CATHETERIZATION     CORONARY ARTERY BYPASS GRAFT     status post diaphragmatic wall infarction Rx BMS RCA 03-11-08    CORONARY STENT PLACEMENT     CYSTOSCOPY WITH RETROGRADE PYELOGRAM, URETEROSCOPY AND STENT PLACEMENT Bilateral 03/16/2015   Procedure: CYSTOSCOPY WITH BILATERAL RETROGRADE PYELOGRAM, RIGHT URETEROSCOPY, STONE BASKETRY WITH LASER LITHOTRIPSY ON THE RIGHT,  AND BILATERAL STENT PLACEMENT;  Surgeon: Marco Severs, MD;  Location: [facility];  Service: Urology;  Laterality: Bilateral;   CYSTOSCOPY WITH RETROGRADE PYELOGRAM, URETEROSCOPY AND STENT PLACEMENT Left 04/02/2015   Procedure: CYSTOSCOPY WITH RETROGRADE PYELOGRAM, URETEROSCOPY REMOVAL BILATERAL STENTS;  Surgeon: Marco Severs, MD;  Location: [facility];  Service: Urology;  Laterality: Left;   HOLMIUM LASER APPLICATION Right 03/16/2015   Procedure: HOLMIUM LASER APPLICATION;  Surgeon: Marco Severs, MD;  Location: [facility];  Service: Urology;  Laterality: Right;   LAPAROSCOPIC APPENDECTOMY N/A 09/17/2013   Procedure: APPENDECTOMY LAPAROSCOPIC;  Surgeon: Rogena Class, MD;  Location: [facility];  Service: General;  Laterality: N/A;   LEFT HEART CATHETERIZATION WITH CORONARY/GRAFT ANGIOGRAM N/A 10/19/2011   Procedure: LEFT HEART CATHETERIZATION WITH Estella Helling;  Surgeon: Kristopher Pheasant, MD;  Location: [facility];  Service: Cardiovascular;  Laterality: N/A;   ORTHOPEDIC SURGERY     TONSILLECTOMY      Social History:  reports that he has been smoking cigarettes. He has a 17.5 pack-year smoking  history. He quit smokeless tobacco use about 29 years ago.  His smokeless tobacco use included snuff. He reports current alcohol use. He reports current drug use. Drug: Marijuana.  Allergies:  Allergies  Allergen Reactions   Celexa  [Citalopram ] Itching and Rash   Codeine Itching    Medications Prior to Admission  Medication Sig Dispense Refill   amoxicillin  (AMOXIL ) 500 MG tablet Take 500 mg by mouth every 8 (eight) hours.     atorvastatin  (LIPITOR ) 80 MG tablet Take 1 tablet (80 mg total) by mouth daily. 30 tablet 0   clopidogrel  (PLAVIX ) 75 MG tablet Take 75 mg by mouth daily.     DULoxetine  (CYMBALTA ) 30 MG capsule Take 3 capsules (90 mg total) by mouth daily. 90 capsule 0   fenofibrate  (TRICOR ) 48 MG tablet Take 1 tablet (48 mg total) by mouth daily. 30 tablet 0   isosorbide  mononitrate (IMDUR ) 60 MG 24 hr tablet Take 1 tablet (60 mg total) by mouth daily. 30 tablet 0   losartan  (COZAAR ) 100 MG tablet Take 1 tablet (100 mg total) by mouth daily. 30 tablet 0   metFORMIN (GLUCOPHAGE) 1000 MG tablet Take 500 mg by mouth 2 (two) times daily with a meal.     metoprolol  tartrate (LOPRESSOR ) 25 MG tablet Take 1 tablet (25 mg total) by mouth 2 (two) times daily. 30 tablet 0   MOUNJARO 2.5 MG/0.5ML Pen Inject 2.5 mg into the skin every Friday.     Multiple Vitamin (MULTI-VITAMIN DAILY PO) Take 1 Dose by mouth daily. [employer] vitamin pack takes 1 packet by mouth daily     naltrexone  (DEPADE) 50 MG tablet Take 1 tablet (50 mg total) by mouth daily. 30 tablet 0   nicotine  (NICODERM CQ  - DOSED IN MG/24 HOURS) 21 mg/24hr patch Place 1 patch (21 mg total) onto the skin daily. 28 patch 0   nitroGLYCERIN  (NITROSTAT ) 0.4 MG SL tablet Place 1 tablet (0.4 mg total) under the tongue every 5 (five) minutes as needed for chest pain. 25 tablet 6   ranolazine  (RANEXA ) 500 MG 12 hr tablet Take 1 tablet (500 mg total) by mouth 2 (two) times daily. 60 tablet 0   traZODone  (DESYREL ) 50 MG tablet Take 1 tablet (50 mg  total) by mouth at bedtime for 14 days. 14 tablet 0    Physical Exam: Blood pressure (!) 140/87, pulse (!) 123, temperature 99.5 F (37.5 C), temperature source Oral, resp. rate 18, height 5\' 10"  (1.778 m), weight 72.7 kg, SpO2 98%. Gen: male, NAD Abd: soft, non-distended, TTP in the upper abdomen, no rebound/guarding, no peritoneal signs  Results for orders placed or performed during the hospital encounter of 12/11/23 (from the past 48 hours)  Basic metabolic panel     Status: Abnormal   Collection Time: 12/11/23  7:57 PM  Result Value Ref Range   Sodium 136 135 - 145 mmol/L   Potassium 3.9 3.5 - 5.1 mmol/L  Comment: HEMOLYSIS AT THIS LEVEL MAY AFFECT RESULT   Chloride 103 98 - 111 mmol/L   CO2 23 22 - 32 mmol/L   Glucose, Bld 124 (H) 70 - 99 mg/dL    Comment: Glucose reference range applies only to samples taken after fasting for at least 8 hours.   BUN 14 6 - 20 mg/dL   Creatinine, Ser 6.21 0.61 - 1.24 mg/dL   Calcium  9.2 8.9 - 10.3 mg/dL   GFR, Estimated >30 >86 mL/min    Comment: (NOTE) Calculated using the CKD-EPI Creatinine Equation (2021)    Anion gap 10 5 - 15    Comment: Performed at [facility], [address], [place], [place] [address]  CBC     Status: Abnormal   Collection Time: 12/11/23  7:57 PM  Result Value Ref Range   WBC 8.0 4.0 - 10.5 K/uL   RBC 4.05 (L) 4.22 - 5.81 MIL/uL   Hemoglobin 12.8 (L) 13.0 - 17.0 g/dL   HCT 96.2 (L) 95.2 - 84.1 %   MCV 94.3 80.0 - 100.0 fL   MCH 31.6 26.0 - 34.0 pg   MCHC 33.5 30.0 - 36.0 g/dL   RDW 32.4 40.1 - 02.7 %   Platelets 306 150 - 400 K/uL   nRBC 0.0 0.0 - 0.2 %    Comment: Performed at [facility], [address] [place], [place] [address]  Troponin I (High Sensitivity)     Status: None   Collection Time: 12/11/23  7:57 PM  Result Value Ref Range   Troponin I (High Sensitivity) 8 <18 ng/L    Comment: (NOTE) Elevated high sensitivity troponin I (hsTnI) values and significant  changes across  serial measurements may suggest ACS but many other  chronic and acute conditions are known to elevate hsTnI results.  Refer to the "Links" section for chest pain algorithms and additional  guidance. Performed at [facility], [address], [place], [place] [address]   Brain natriuretic peptide     Status: None   Collection Time: 12/11/23  7:57 PM  Result Value Ref Range   B Natriuretic Peptide 36.8 0.0 - 100.0 pg/mL    Comment: Performed at [facility], [address] [place], [place] [address]  Magnesium      Status: Abnormal   Collection Time: 12/11/23  7:57 PM  Result Value Ref Range   Magnesium  1.2 (L) 1.7 - 2.4 mg/dL    Comment: Performed at [facility], [address] [place], [place] [address]  Troponin I (High Sensitivity)     Status: None   Collection Time: 12/11/23  9:52 PM  Result Value Ref Range   Troponin I (High Sensitivity) 7 <18 ng/L    Comment: (NOTE) Elevated high sensitivity troponin I (hsTnI) values and significant  changes across serial measurements may suggest ACS but many other  chronic and acute conditions are known to elevate hsTnI results.  Refer to the "Links" section for chest pain algorithms and additional  guidance. Performed at [facility], [address] [place], [place] [address]   CBC     Status: Abnormal   Collection Time: 12/12/23  9:01 AM  Result Value Ref Range   WBC 12.8 (H) 4.0 - 10.5 K/uL   RBC 4.25 4.22 - 5.81 MIL/uL   Hemoglobin 13.3 13.0 - 17.0 g/dL   HCT 29.5 18.8 - 41.6 %   MCV 92.9 80.0 - 100.0 fL  MCH 31.3 26.0 - 34.0 pg   MCHC 33.7 30.0 - 36.0 g/dL   RDW 45.4 09.8 - 11.9 %   Platelets 290 150 - 400 K/uL   nRBC 0.0 0.0 - 0.2 %    Comment: Performed at [facility], [address], [place], [place] [address]  Heparin  level (unfractionated)     Status: None   Collection Time: 12/12/23  9:01 AM  Result Value Ref Range   Heparin  Unfractionated 0.50 0.30 - 0.70 IU/mL    Comment:  (NOTE) The clinical reportable range upper limit is being lowered to >1.10 to align with the FDA approved guidance for the current laboratory assay.  If heparin  results are below expected values, and patient dosage has  been confirmed, suggest follow up testing of antithrombin III levels. Performed at [facility], [address], [place], [place] [address]   Heparin  level (unfractionated)     Status: None   Collection Time: 12/12/23  4:26 PM  Result Value Ref Range   Heparin  Unfractionated 0.38 0.30 - 0.70 IU/mL    Comment: (NOTE) The clinical reportable range upper limit is being lowered to >1.10 to align with the FDA approved guidance for the current laboratory assay.  If heparin  results are below expected values, and patient dosage has  been confirmed, suggest follow up testing of antithrombin III levels. Performed at [facility], [address] [place], [place] [address]   Troponin I (High Sensitivity)     Status: None   Collection Time: 12/12/23  6:39 PM  Result Value Ref Range   Troponin I (High Sensitivity) 9 <18 ng/L    Comment: (NOTE) Elevated high sensitivity troponin I (hsTnI) values and significant  changes across serial measurements may suggest ACS but many other  chronic and acute conditions are known to elevate hsTnI results.  Refer to the "Links" section for chest pain algorithms and additional  guidance. Performed at [facility], [address], [place], [place] [address]   Lactic acid, plasma     Status: None   Collection Time: 12/12/23  6:39 PM  Result Value Ref Range   Lactic Acid, Venous 1.3 0.5 - 1.9 mmol/L    Comment: Performed at [facility], [address], [place], [place] [address]  Hepatic function panel     Status: Abnormal   Collection Time: 12/12/23  6:39 PM  Result Value Ref Range   Total Protein 5.9 (L) 6.5 - 8.1 g/dL   Albumin 2.8 (L) 3.5 - 5.0 g/dL   AST 841 (H) 15 - 41 U/L   ALT 242 (H) 0 - 44 U/L    Alkaline Phosphatase 157 (H) 38 - 126 U/L   Total Bilirubin 3.1 (H) 0.0 - 1.2 mg/dL   Bilirubin, Direct 2.1 (H) 0.0 - 0.2 mg/dL   Indirect Bilirubin 1.0 (H) 0.3 - 0.9 mg/dL    Comment: Performed at [facility], [address], [place], [place] [address]  CBC     Status: Abnormal   Collection Time: 12/13/23  4:35 AM  Result Value Ref Range   WBC 9.3 4.0 - 10.5 K/uL   RBC 3.57 (L) 4.22 - 5.81 MIL/uL   Hemoglobin 10.9 (L) 13.0 - 17.0 g/dL   HCT 10.2 (L) 72.5 - 36.6 %   MCV 91.9 80.0 - 100.0 fL   MCH 30.5 26.0 - 34.0 pg   MCHC 33.2 30.0 - 36.0 g/dL   RDW 44.0 34.7 -  15.5 %   Platelets 230 150 - 400 K/uL   nRBC 0.0 0.0 - 0.2 %    Comment: Performed at [facility], [address], [place], [place] [address]  Heparin  level (unfractionated)     Status: Abnormal   Collection Time: 12/13/23  4:35 AM  Result Value Ref Range   Heparin  Unfractionated 0.15 (L) 0.30 - 0.70 IU/mL    Comment: (NOTE) The clinical reportable range upper limit is being lowered to >1.10 to align with the FDA approved guidance for the current laboratory assay.  If heparin  results are below expected values, and patient dosage has  been confirmed, suggest follow up testing of antithrombin III levels. Performed at [facility], [address] [place], [place] [address]   Comprehensive metabolic panel     Status: Abnormal   Collection Time: 12/13/23  4:35 AM  Result Value Ref Range   Sodium 134 (L) 135 - 145 mmol/L   Potassium 3.7 3.5 - 5.1 mmol/L   Chloride 106 98 - 111 mmol/L   CO2 24 22 - 32 mmol/L   Glucose, Bld 120 (H) 70 - 99 mg/dL    Comment: Glucose reference range applies only to samples taken after fasting for at least 8 hours.   BUN 16 6 - 20 mg/dL   Creatinine, Ser 0.34 0.61 - 1.24 mg/dL   Calcium  8.3 (L) 8.9 - 10.3 mg/dL   Total Protein 5.8 (L) 6.5 - 8.1 g/dL   Albumin 2.8 (L) 3.5 - 5.0 g/dL   AST 742 (H) 15 - 41 U/L   ALT 220 (H) 0 - 44 U/L   Alkaline Phosphatase 145 (H) 38 -  126 U/L   Total Bilirubin 3.3 (H) 0.0 - 1.2 mg/dL   GFR, Estimated >59 >56 mL/min    Comment: (NOTE) Calculated using the CKD-EPI Creatinine Equation (2021)    Anion gap 4 (L) 5 - 15    Comment: Performed at [facility], [address], [place], [place] [address]  Lipase, blood     Status: None   Collection Time: 12/13/23  4:35 AM  Result Value Ref Range   Lipase 44 11 - 51 U/L    Comment: Performed at [facility], [address] [place], [place] [address]  Glucose, capillary     Status: Abnormal   Collection Time: 12/13/23  7:45 AM  Result Value Ref Range   Glucose-Capillary 134 (H) 70 - 99 mg/dL    Comment: Glucose reference range applies only to samples taken after fasting for at least 8 hours.  Glucose, capillary     Status: Abnormal   Collection Time: 12/13/23  1:16 PM  Result Value Ref Range   Glucose-Capillary 115 (H) 70 - 99 mg/dL    Comment: Glucose reference range applies only to samples taken after fasting for at least 8 hours.  CBC     Status: Abnormal   Collection Time: 12/13/23  2:15 PM  Result Value Ref Range   WBC 12.3 (H) 4.0 - 10.5 K/uL   RBC 4.16 (L) 4.22 - 5.81 MIL/uL   Hemoglobin 13.1 13.0 - 17.0 g/dL   HCT 51.8 (L) 84.1 - 66.0 %   MCV 90.6 80.0 - 100.0 fL   MCH 31.5 26.0 - 34.0 pg   MCHC 34.7 30.0 - 36.0 g/dL   RDW 63.0 16.0 - 10.9 %   Platelets 239 150 - 400 K/uL   nRBC 0.0 0.0 - 0.2 %  Comment: Performed at [facility], [address] [place], [place] [address]  Creatinine, serum     Status: None   Collection Time: 12/13/23  2:15 PM  Result Value Ref Range   Creatinine, Ser 0.86 0.61 - 1.24 mg/dL   GFR, Estimated >72 >53 mL/min    Comment: (NOTE) Calculated using the CKD-EPI Creatinine Equation (2021) Performed at [facility], [address] [place], [place] [address]    CT ABDOMEN PELVIS W CONTRAST Result Date: 12/13/2023 CLINICAL DATA:  Abdominal pain with elevated liver enzymes. EXAM: CT ABDOMEN AND PELVIS  WITH CONTRAST TECHNIQUE: Multidetector CT imaging of the abdomen and pelvis was performed using the standard protocol following bolus administration of intravenous contrast. RADIATION DOSE REDUCTION: This exam was performed according to the departmental dose-optimization program which includes automated exposure control, adjustment of the mA and/or kV according to patient size and/or use of iterative reconstruction technique. CONTRAST:  75mL OMNIPAQUE  IOHEXOL  350 MG/ML SOLN COMPARISON:  CT stone study 08/05/2019 FINDINGS: Lower chest: Dependent atelectasis. Hepatobiliary: No suspicious focal abnormality within the liver parenchyma. Gallbladder is distended with pericholecystic edema/fluid. Intra and extrahepatic biliary duct dilatation evident with common bile duct measuring 12 mm diameter in the head of the pancreas. Pancreas: No focal mass lesion. No dilatation of the main duct. No intraparenchymal cyst. No peripancreatic edema. Spleen: No splenomegaly. No suspicious focal mass lesion. Adrenals/Urinary Tract: No adrenal nodule or mass. Kidneys unremarkable. No evidence for hydroureter. The urinary bladder appears normal for the degree of distention. Stomach/Bowel: Stomach is moderately distended with food and fluid. Duodenum is normally positioned as is the ligament of Treitz. No small bowel wall thickening. No small bowel dilatation. Nonvisualization of the appendix is consistent with the reported history of appendectomy. No gross colonic mass. No colonic wall thickening. Vascular/Lymphatic: There is moderate atherosclerotic calcification of the abdominal aorta without aneurysm. Upper normal lymph nodes are seen in the hepatoduodenal ligament. 9 mm short axis pre aortic node on 47/3 is upper normal for size. No pelvic sidewall lymphadenopathy. Reproductive: The prostate gland and seminal vesicles are unremarkable. Other: Trace fluid or edema seen in the omentum and right anterior pelvis (60/3). Musculoskeletal:  No worrisome lytic or sclerotic osseous abnormality. IMPRESSION: 1. Distended gallbladder with pericholecystic edema/fluid. Imaging features are concerning for acute cholecystitis. 2. Intra and extrahepatic biliary duct dilatation with common bile duct measuring 12 mm diameter in the head of the pancreas. Correlation with liver function test recommended. MRCP or ERCP recommended to further evaluate. 3. Upper normal lymph nodes in the hepatoduodenal ligament and pre aortic region. These are likely reactive. Consider follow-up CT in 3 months to ensure stability. 4.  Aortic Atherosclerosis (ICD10-I70.0). Electronically Signed   By: Donnal Fusi M.D.   On: 12/13/2023 11:45   DG Chest Portable 1 View Result Date: 12/11/2023 CLINICAL DATA:  Chest pain with cardiac history, vomiting EXAM: PORTABLE CHEST 1 VIEW COMPARISON:  05/13/2022 FINDINGS: Sternotomy and CABG. Stable cardiomediastinal silhouette. No focal consolidation, pleural effusion, or pneumothorax. No displaced rib fractures. IMPRESSION: No active disease. Electronically Signed   By: Rozell Cornet M.D.   On: 12/11/2023 20:29    Assessment/Plan 48 y/o M w/ a hx of CAD p/w epigastric pain and has imaging and exam c/w cholecystitis with possible choledocholithiasis  - No recent echo in the system and last heart cath was in 2013.  He will need cardiac risk stratification prior to considering intervention - MRCP ordered - Start rocephin  - CLD today. NPO  at [facility] - Continue holding Plavix  - Surgery will follow up cardiology workup and MRCP to determine next steps  Trula Gable Surgery 12/13/2023, 3:28 PM Please see Amion for pager number during day hours 7:00am-4:30pm or 7:00am -11:30am on weekends

## 2023-12-13 NOTE — Progress Notes (Signed)
 PHARMACY - ANTICOAGULATION CONSULT NOTE  Pharmacy Consult for heparin  Indication: USAP  Labs: Recent Labs    12/11/23 1957 12/11/23 2152 12/12/23 0901 12/12/23 1626 12/12/23 1839 12/13/23 0435  HGB 12.8*  --  13.3  --   --  10.9*  HCT 38.2*  --  39.5  --   --  32.8*  PLT 306  --  290  --   --  230  HEPARINUNFRC  --   --  0.50 0.38  --  0.15*  CREATININE 1.07  --   --   --   --  0.93  TROPONINIHS 8 7  --   --  9  --    Assessment: 47yo male subtherapeutic on heparin  after two levels at goal though had been trending down and at lower end of goal; no infusion issues or signs of bleeding per RN, Hgb trending down but has rec'd several IVF boluses and higher rates of NTG infusion so could be dilutional.  Goal of Therapy:  Heparin  level 0.3-0.7 units/ml   Plan:  2000 units heparin  bolus. Increase heparin  infusion by 2-3 units/kg/hr to 1200 units/hr. Check level in 6 hours.   [name], PharmD, BCPS 12/13/2023 6:20 AM

## 2023-12-13 NOTE — Plan of Care (Signed)

## 2023-12-13 NOTE — Progress Notes (Signed)
 Reviewed CT abdomen today, concern for cholecystitis. General surgery consulted with recommendations for rocephin   and MRCP. I have ordered an echo. Cardiology will formally comment on cardiac risk for surgery tomorrow.   Continue to hold plavix .   David Pilot, PA-C 12/13/2023, 3:34 PM [phone] [facility] [address] [place], [place] [address]

## 2023-12-13 NOTE — Progress Notes (Addendum)
 Progress Note  Patient Name: [name] Date of Encounter: 12/13/2023  Primary Cardiologist: Antoinette Batman, MD   Subjective   No chest pain overnight - Liver enzymes remain elevated. On IV heparin . Blood pressure has improved. His primary complaint is abdominal pain, mostly upper mid-epigastric - pain radiates to the back. He has been nauseated and not been able to eat since he had upper edentulation last week.  Inpatient Medications    Scheduled Meds:  atorvastatin   80 mg Oral Daily   DULoxetine   90 mg Oral Daily   losartan   50 mg Oral Daily   nicotine   21 mg Transdermal Daily   ranolazine   500 mg Oral BID   traZODone   50 mg Oral QHS   Continuous Infusions:  heparin  1,200 Units/hr (12/13/23 0622)   nitroGLYCERIN  140 mcg/min (12/13/23 0600)   PRN Meds: morphine    Vital Signs    Vitals:   12/13/23 0506 12/13/23 0521 12/13/23 0614 12/13/23 0615  BP: 108/68 111/71 134/75 134/75  Pulse: 84 76 (!) 111 (!) 109  Resp:      Temp:      TempSrc:      SpO2: 97% 98% 98% 99%  Weight:      Height:        Intake/Output Summary (Last 24 hours) at 12/13/2023 0745 Last data filed at 12/13/2023 0700 Gross per 24 hour  Intake 3206.84 ml  Output 2750 ml  Net 456.84 ml   Filed Weights   12/11/23 2200 12/11/23 2330  Weight: 83 kg 72.7 kg    Telemetry    NSR with sinus tachycardia overnight - Personally Reviewed  ECG    NSR at 83, inferior infarct pattern - Personally Reviewed  Physical Exam   GEN: No acute distress.   Neck: No JVD Cardiac: RRR, no murmurs, rubs, or gallops.  Respiratory: Clear to auscultation bilaterally. GI: mild TTP over the upper midepigastrium, no rebound but voluntary guarding MS: No edema; No deformity. Neuro:  Nonfocal  Psych: Normal affect   Labs    Chemistry Recent Labs  Lab 12/11/23 1957 12/12/23 1839 12/13/23 0435  NA 136  --  134*  K 3.9  --  3.7  CL 103  --  106  CO2 23  --  24  GLUCOSE 124*  --  120*  BUN 14   --  16  CREATININE 1.07  --  0.93  CALCIUM  9.2  --  8.3*  PROT  --  5.9* 5.8*  ALBUMIN  --  2.8* 2.8*  AST  --  265* 208*  ALT  --  242* 220*  ALKPHOS  --  157* 145*  BILITOT  --  3.1* 3.3*  GFRNONAA >60  --  >60  ANIONGAP 10  --  4*     Hematology Recent Labs  Lab 12/11/23 1957 12/12/23 0901 12/13/23 0435  WBC 8.0 12.8* 9.3  RBC 4.05* 4.25 3.57*  HGB 12.8* 13.3 10.9*  HCT 38.2* 39.5 32.8*  MCV 94.3 92.9 91.9  MCH 31.6 31.3 30.5  MCHC 33.5 33.7 33.2  RDW 14.8 14.8 14.9  PLT 306 290 230    Cardiac EnzymesNo results for input(s): "TROPONINI" in the last 168 hours. No results for input(s): "TROPIPOC" in the last 168 hours.   BNP Recent Labs  Lab 12/11/23 1957  BNP 36.8     DDimer No results for input(s): "DDIMER" in the last 168 hours.   Radiology    DG Chest Portable 1 View Result Date:  12/11/2023 CLINICAL DATA:  Chest pain with cardiac history, vomiting EXAM: PORTABLE CHEST 1 VIEW COMPARISON:  05/13/2022 FINDINGS: Sternotomy and CABG. Stable cardiomediastinal silhouette. No focal consolidation, pleural effusion, or pneumothorax. No displaced rib fractures. IMPRESSION: No active disease. Electronically Signed   By: Rozell Cornet M.D.   On: 12/11/2023 20:29    Cardiac Studies   N/A  Patient Profile     48 y.o. male admitted with USA , now hypotensive on NTG.  Assessment & Plan    UA - he is pain free. Trops negative - he is primarily having abdominal pain. Will stop heparin  since it has been >48 hrs. Hypotension - BP has improved with IV fluids, but he is on high dose nitroglycerin  gtts- will wean that to off today. Sinus tachy- noted to be tachy overnight, but resolved today. Transaminitis - AST/ALT 208/220, elevated Alk phos and bilirubin at 3.3 (mostly direct 2.1). These were normal in 2024. Will hold statin. Obtain abdominal CT scan today- concerning for cholecystitis and possibly pancreatitis with duct obstruction. He reports prior appendectomy and not  sure if he had his gallbladder out or not. Add lipase.  May need GI evaluation.  For questions or updates, please contact [facility] Please consult [email or web address] for contact info under Cardiology/STEMI.   Hazle Lites, MD, [facility], FNLA, FACP  [place]  [facility]  Medical Director of the [facility]  Card[facility]ovascular [facility] Reduction Clinic Diplomate of the American Board of Clinical Lipidology Attending Cardiologist  Direct Dial: [phone]  Fax: [phone]  Website:  [email or web address]  Hazle Lites, MD  12/13/2023, 7:45 AM

## 2023-12-14 ENCOUNTER — Inpatient Hospital Stay (HOSPITAL_COMMUNITY)

## 2023-12-14 DIAGNOSIS — R079 Chest pain, unspecified: Secondary | ICD-10-CM

## 2023-12-14 DIAGNOSIS — I959 Hypotension, unspecified: Secondary | ICD-10-CM | POA: Diagnosis not present

## 2023-12-14 DIAGNOSIS — K810 Acute cholecystitis: Secondary | ICD-10-CM | POA: Diagnosis not present

## 2023-12-14 DIAGNOSIS — E876 Hypokalemia: Secondary | ICD-10-CM

## 2023-12-14 DIAGNOSIS — I200 Unstable angina: Secondary | ICD-10-CM | POA: Diagnosis not present

## 2023-12-14 LAB — CBC
HCT: 32.5 % — ABNORMAL LOW (ref 39.0–52.0)
Hemoglobin: 11.3 g/dL — ABNORMAL LOW (ref 13.0–17.0)
MCH: 31.7 pg (ref 26.0–34.0)
MCHC: 34.8 g/dL (ref 30.0–36.0)
MCV: 91 fL (ref 80.0–100.0)
Platelets: 220 10*3/uL (ref 150–400)
RBC: 3.57 MIL/uL — ABNORMAL LOW (ref 4.22–5.81)
RDW: 14.8 % (ref 11.5–15.5)
WBC: 8.1 10*3/uL (ref 4.0–10.5)
nRBC: 0 % (ref 0.0–0.2)

## 2023-12-14 LAB — COMPREHENSIVE METABOLIC PANEL WITH GFR
ALT: 159 U/L — ABNORMAL HIGH (ref 0–44)
AST: 101 U/L — ABNORMAL HIGH (ref 15–41)
Albumin: 2.4 g/dL — ABNORMAL LOW (ref 3.5–5.0)
Alkaline Phosphatase: 147 U/L — ABNORMAL HIGH (ref 38–126)
Anion gap: 9 (ref 5–15)
BUN: 15 mg/dL (ref 6–20)
CO2: 21 mmol/L — ABNORMAL LOW (ref 22–32)
Calcium: 8.7 mg/dL — ABNORMAL LOW (ref 8.9–10.3)
Chloride: 106 mmol/L (ref 98–111)
Creatinine, Ser: 0.74 mg/dL (ref 0.61–1.24)
GFR, Estimated: >60 mL/min (ref >60)
Glucose, Bld: 91 mg/dL (ref 70–99)
Potassium: 3.4 mmol/L — ABNORMAL LOW (ref 3.5–5.1)
Sodium: 136 mmol/L (ref 135–145)
Total Bilirubin: 3.7 mg/dL — ABNORMAL HIGH (ref 0.0–1.2)
Total Protein: 5.6 g/dL — ABNORMAL LOW (ref 6.5–8.1)

## 2023-12-14 LAB — PROTIME-INR
INR: 1 (ref 0.8–1.2)
Prothrombin Time: 13.7 s (ref 11.4–15.2)

## 2023-12-14 LAB — ECHOCARDIOGRAM COMPLETE
AR max vel: 2.96 cm2
AV Peak grad: 4.7 mmHg
Ao pk vel: 1.08 m/s
Area-P 1/2: 3.53 cm2
Height: 70 in
MV VTI: 2.18 cm2
S' Lateral: 4.7 cm
Weight: 2563.2 [oz_av]

## 2023-12-14 LAB — GLUCOSE, CAPILLARY
Glucose-Capillary: 90 mg/dL (ref 70–99)
Glucose-Capillary: 79 mg/dL (ref 70–99)
Glucose-Capillary: 79 mg/dL (ref 70–99)

## 2023-12-14 MED ORDER — METRONIDAZOLE 500 MG/100ML IV SOLN
500.0000 mg | Freq: Two times a day (BID) | INTRAVENOUS | Status: DC
  Administered 2023-12-14 – 2023-12-15 (×3): 500 mg via INTRAVENOUS
  Filled 2023-12-14 (×3): qty 100

## 2023-12-14 MED ORDER — POTASSIUM CHLORIDE 20 MEQ PO PACK
40.0000 meq | PACK | Freq: Once | ORAL | Status: AC
  Administered 2023-12-14: 40 meq via ORAL
  Filled 2023-12-14: qty 2

## 2023-12-14 MED ORDER — LOSARTAN POTASSIUM 25 MG PO TABS
25.0000 mg | ORAL_TABLET | Freq: Every day | ORAL | Status: DC
  Administered 2023-12-14 – 2023-12-16 (×2): 25 mg via ORAL
  Filled 2023-12-14 (×2): qty 1

## 2023-12-14 MED ORDER — SODIUM CHLORIDE 0.9 % IV SOLN
2.0000 g | INTRAVENOUS | Status: DC
  Administered 2023-12-14 – 2023-12-15 (×2): 2 g via INTRAVENOUS
  Filled 2023-12-14 (×2): qty 20

## 2023-12-14 MED ORDER — PERFLUTREN LIPID MICROSPHERE
1.0000 mL | INTRAVENOUS | Status: AC | PRN
  Administered 2023-12-14: 2 mL via INTRAVENOUS

## 2023-12-14 MED ORDER — ONDANSETRON HCL 4 MG/2ML IJ SOLN
4.0000 mg | Freq: Four times a day (QID) | INTRAMUSCULAR | Status: DC | PRN
Start: 2023-12-14 — End: 2023-12-16
  Administered 2023-12-14 – 2023-12-16 (×5): 4 mg via INTRAVENOUS
  Filled 2023-12-14 (×5): qty 2

## 2023-12-14 MED ORDER — ASPIRIN 81 MG PO TBEC
81.0000 mg | DELAYED_RELEASE_TABLET | Freq: Every day | ORAL | Status: DC
  Administered 2023-12-14 – 2023-12-16 (×2): 81 mg via ORAL
  Filled 2023-12-14 (×2): qty 1

## 2023-12-14 MED ORDER — INDOCYANINE GREEN 25 MG IV SOLR
1.2500 mg | Freq: Once | INTRAVENOUS | Status: AC
  Administered 2023-12-15: 1.25 mg via INTRAVENOUS
  Filled 2023-12-14: qty 10

## 2023-12-14 NOTE — Progress Notes (Signed)
 Progress Note     Subjective: Pt reports ongoing RUQ abdominal pain and nausea. Discussed need for cardiac clearance and he understands.   Objective: Vital signs in last 24 hours: Temp:  [97.4 F (36.3 C)-99.5 F (37.5 C)] 97.7 F (36.5 C) (05/27 0732) Pulse Rate:  [65-123] 65 (05/27 0401) Resp:  [17-18] 18 (05/27 0732) BP: (91-140)/(57-87) 115/81 (05/27 0732) SpO2:  [96 %-98 %] 97 % (05/27 0401) Last BM Date : 12/12/23  Intake/Output from previous day: 05/26 0701 - 05/27 0700 In: -  Out: 1100 [Urine:1100] Intake/Output this shift: No intake/output data recorded.  PE: General: pleasant, WD, WN male who is laying in bed in NAD HEENT: sclera anicteric Heart: regular, rate, and rhythm.   Lungs: Respiratory effort nonlabored Abd: soft, TTP in RUQ, ND Psych: A&Ox3 with an appropriate affect.    Lab Results:  Recent Labs    12/13/23 1415 12/14/23 0422  WBC 12.3* 8.1  HGB 13.1 11.3*  HCT 37.7* 32.5*  PLT 239 220   BMET Recent Labs    12/13/23 0435 12/13/23 1415 12/14/23 0422  NA 134*  --  136  K 3.7  --  3.4*  CL 106  --  106  CO2 24  --  21*  GLUCOSE 120*  --  91  BUN 16  --  15  CREATININE 0.93 0.86 0.74  CALCIUM  8.3*  --  8.7*   PT/INR Recent Labs    12/14/23 0830  LABPROT 13.7  INR 1.0   CMP     Component Value Date/Time   NA 136 12/14/2023 0422   NA 139 02/18/2018 1026   K 3.4 (L) 12/14/2023 0422   CL 106 12/14/2023 0422   CO2 21 (L) 12/14/2023 0422   GLUCOSE 91 12/14/2023 0422   BUN 15 12/14/2023 0422   BUN 11 02/18/2018 1026   CREATININE 0.74 12/14/2023 0422   CREATININE 1.05 07/25/2018 1446   CALCIUM  8.7 (L) 12/14/2023 0422   PROT 5.6 (L) 12/14/2023 0422   PROT 7.4 02/18/2018 1026   ALBUMIN 2.4 (L) 12/14/2023 0422   ALBUMIN 4.8 02/18/2018 1026   AST 101 (H) 12/14/2023 0422   ALT 159 (H) 12/14/2023 0422   ALKPHOS 147 (H) 12/14/2023 0422   BILITOT 3.7 (H) 12/14/2023 0422   BILITOT 0.4 02/18/2018 1026   GFRNONAA >60 12/14/2023  0422   GFRNONAA 87 07/25/2018 1446   GFRAA >60 08/07/2019 1706   GFRAA 101 07/25/2018 1446   Lipase     Component Value Date/Time   LIPASE 44 12/13/2023 0435       Studies/Results: US  Abdomen Limited RUQ (LIVER/GB) Result Date: 12/14/2023 CLINICAL DATA:  Abdominal pain and elevated liver enzymes. EXAM: ULTRASOUND ABDOMEN LIMITED RIGHT UPPER QUADRANT COMPARISON:  Same day MRI abdomen and CT abdomen pelvis FINDINGS: Gallbladder: Sludge in the gallbladder. The gallbladder wall is thickened measuring 5 mm. There is pericholecystic fluid. No sonographic Murphy sign noted by sonographer. Common bile duct: Diameter: 5 mm.  No intrahepatic biliary dilation. Liver: No focal lesion identified. Within normal limits in parenchymal echogenicity. Portal vein is patent on color Doppler imaging with normal direction of blood flow towards the liver. Other: None. IMPRESSION: Sludge in the gallbladder with wall thickening and pericholecystic fluid. Negative sonographic Murphy's sign. Findings suggestive acute cholecystitis. Electronically Signed   By: Rozell Cornet M.D.   On: 12/14/2023 00:06   MR ABDOMEN MRCP W WO CONTAST Result Date: 12/13/2023 CLINICAL DATA:  Abdominal pain. Elevated liver enzymes. Biliary ductal dilatation  on recent CT. EXAM: MRI ABDOMEN WITHOUT AND WITH CONTRAST (INCLUDING MRCP) TECHNIQUE: Multiplanar multisequence MR imaging of the abdomen was performed both before and after the administration of intravenous contrast. Heavily T2-weighted images of the biliary and pancreatic ducts were obtained, and three-dimensional MRCP images were rendered by post processing. CONTRAST:  7mL GADAVIST GADOBUTROL 1 MMOL/ML IV SOLN COMPARISON:  CT on 12/13/2023 FINDINGS: Lower chest: No acute findings. Hepatobiliary: No hepatic masses identified. A few tiny filling defects in the gallbladder lumen are suspicious for tiny gallstones. No evidence of gallbladder wall thickening or dilatation, however, there is  small amount of pericholecystic fluid. Periportal edema is seen within the liver, however, there is no evidence of biliary ductal dilatation or choledocholithiasis. Common bile duct measures 6 mm in diameter. Pancreas: No mass or inflammatory changes. No evidence of pancreatic ductal dilatation or pancreas divisum. Spleen:  Within normal limits in size and appearance. Adrenals/Urinary Tract: No suspicious masses identified. No evidence of hydronephrosis. Stomach/Bowel: Unremarkable. Vascular/Lymphatic: No pathologically enlarged lymph nodes identified. No acute vascular findings. Other:  None. Musculoskeletal:  No suspicious bone lesions identified. IMPRESSION: Periportal edema and small amount of pericholecystic fluid, which are nonspecific and of uncertain etiology. No evidence of biliary ductal dilatation or choledocholithiasis. Probable tiny gallstones. No definite radiographic evidence of acute cholecystitis. Consider ultrasound for further evaluation. Electronically Signed   By: Marlyce Sine M.D.   On: 12/13/2023 19:36   MR 3D Recon At Scanner Result Date: 12/13/2023 CLINICAL DATA:  Abdominal pain. Elevated liver enzymes. Biliary ductal dilatation on recent CT. EXAM: MRI ABDOMEN WITHOUT AND WITH CONTRAST (INCLUDING MRCP) TECHNIQUE: Multiplanar multisequence MR imaging of the abdomen was performed both before and after the administration of intravenous contrast. Heavily T2-weighted images of the biliary and pancreatic ducts were obtained, and three-dimensional MRCP images were rendered by post processing. CONTRAST:  7mL GADAVIST GADOBUTROL 1 MMOL/ML IV SOLN COMPARISON:  CT on 12/13/2023 FINDINGS: Lower chest: No acute findings. Hepatobiliary: No hepatic masses identified. A few tiny filling defects in the gallbladder lumen are suspicious for tiny gallstones. No evidence of gallbladder wall thickening or dilatation, however, there is small amount of pericholecystic fluid. Periportal edema is seen within the  liver, however, there is no evidence of biliary ductal dilatation or choledocholithiasis. Common bile duct measures 6 mm in diameter. Pancreas: No mass or inflammatory changes. No evidence of pancreatic ductal dilatation or pancreas divisum. Spleen:  Within normal limits in size and appearance. Adrenals/Urinary Tract: No suspicious masses identified. No evidence of hydronephrosis. Stomach/Bowel: Unremarkable. Vascular/Lymphatic: No pathologically enlarged lymph nodes identified. No acute vascular findings. Other:  None. Musculoskeletal:  No suspicious bone lesions identified. IMPRESSION: Periportal edema and small amount of pericholecystic fluid, which are nonspecific and of uncertain etiology. No evidence of biliary ductal dilatation or choledocholithiasis. Probable tiny gallstones. No definite radiographic evidence of acute cholecystitis. Consider ultrasound for further evaluation. Electronically Signed   By: Marlyce Sine M.D.   On: 12/13/2023 19:36   CT ABDOMEN PELVIS W CONTRAST Result Date: 12/13/2023 CLINICAL DATA:  Abdominal pain with elevated liver enzymes. EXAM: CT ABDOMEN AND PELVIS WITH CONTRAST TECHNIQUE: Multidetector CT imaging of the abdomen and pelvis was performed using the standard protocol following bolus administration of intravenous contrast. RADIATION DOSE REDUCTION: This exam was performed according to the departmental dose-optimization program which includes automated exposure control, adjustment of the mA and/or kV according to patient size and/or use of iterative reconstruction technique. CONTRAST:  75mL OMNIPAQUE  IOHEXOL  350 MG/ML SOLN COMPARISON:  CT stone  study 08/05/2019 FINDINGS: Lower chest: Dependent atelectasis. Hepatobiliary: No suspicious focal abnormality within the liver parenchyma. Gallbladder is distended with pericholecystic edema/fluid. Intra and extrahepatic biliary duct dilatation evident with common bile duct measuring 12 mm diameter in the head of the pancreas.  Pancreas: No focal mass lesion. No dilatation of the main duct. No intraparenchymal cyst. No peripancreatic edema. Spleen: No splenomegaly. No suspicious focal mass lesion. Adrenals/Urinary Tract: No adrenal nodule or mass. Kidneys unremarkable. No evidence for hydroureter. The urinary bladder appears normal for the degree of distention. Stomach/Bowel: Stomach is moderately distended with food and fluid. Duodenum is normally positioned as is the ligament of Treitz. No small bowel wall thickening. No small bowel dilatation. Nonvisualization of the appendix is consistent with the reported history of appendectomy. No gross colonic mass. No colonic wall thickening. Vascular/Lymphatic: There is moderate atherosclerotic calcification of the abdominal aorta without aneurysm. Upper normal lymph nodes are seen in the hepatoduodenal ligament. 9 mm short axis pre aortic node on 47/3 is upper normal for size. No pelvic sidewall lymphadenopathy. Reproductive: The prostate gland and seminal vesicles are unremarkable. Other: Trace fluid or edema seen in the omentum and right anterior pelvis (60/3). Musculoskeletal: No worrisome lytic or sclerotic osseous abnormality. IMPRESSION: 1. Distended gallbladder with pericholecystic edema/fluid. Imaging features are concerning for acute cholecystitis. 2. Intra and extrahepatic biliary duct dilatation with common bile duct measuring 12 mm diameter in the head of the pancreas. Correlation with liver function test recommended. MRCP or ERCP recommended to further evaluate. 3. Upper normal lymph nodes in the hepatoduodenal ligament and pre aortic region. These are likely reactive. Consider follow-up CT in 3 months to ensure stability. 4.  Aortic Atherosclerosis (ICD10-I70.0). Electronically Signed   By: Donnal Fusi M.D.   On: 12/13/2023 11:45    Anti-infectives: Anti-infectives (From admission, onward)    Start     Dose/Rate Route Frequency Ordered Stop   12/14/23 0915  metroNIDAZOLE  (FLAGYL) IVPB 500 mg        500 mg 100 mL/hr over 60 Minutes Intravenous Every 12 hours 12/14/23 0825     12/14/23 0900  cefTRIAXone  (ROCEPHIN ) 2 g in sodium chloride  0.9 % 100 mL IVPB        2 g 200 mL/hr over 30 Minutes Intravenous Every 24 hours 12/14/23 0802     12/13/23 1525  cefTRIAXone  (ROCEPHIN ) 1 g in sodium chloride  0.9 % 100 mL IVPB  Status:  Discontinued        1 g 200 mL/hr over 30 Minutes Intravenous Every 12 hours 12/13/23 1525 12/14/23 0802        Assessment/Plan  Acute cholecystitis  - CT 5/26 with distention of gallbladder and pericholecystic inflammation, intra and extrahepatic ductal dilatation with CBD 12 mm - MRCP 5/26 without choledocholithiasis and no evidence of biliary ductal dilatation, periportal edema and pericholecystic fluid and probable small gallstones - RUQ US  this AM with sludge in the gallbladder with wall thickening and pericholecystic fluid, suggestive of acute cholecystitis  - Tbili 3.7 from 3.1 on 5/25, AST/ALT downtrending  - WBC 8 from 12.3 yesterday and pt with persistent RUQ pain and nausea - ECHO scheduled for this afternoon but cardiology anticipates low risk for surgery per note earlier today - I have explained the procedure, risks, and aftercare of Laparoscopic cholecystectomy.  Risks include but are not limited to anesthesia (MI, CVA, death, prolonged intubation and aspiration), bleeding, infection, wound problems, hernia, bile leak, injury to common bile duct/liver/intestine, possible need for subtotal cholecystectomy or  open cholecystectomy, increased risk of DVT/PE and diarrhea post op. He seems to understand and agrees to proceed if cleared - will hold off on OR today until definite cardiac clearance, ok to have a HH diet today. Will make NPO after MN in anticipation of possible OR tomorrow. Repeat labs in AM as well   FEN: HH diet, NPO after MN, IVF per primary  VTE: SQH, continue to hold Plavix   ID: rocephin /flagyl 5/26>>  - per  primary attending -  CAD s/p CABG and stenting on plavix  (LD 5/24) CHF - ECHO scheduled for today HTN HLD Hx of alcohol abuse  LOS: 3 days   I reviewed last 24 h vitals and pain scores, last 48 h intake and output, last 24 h labs and trends, last 24 h imaging results, and cardiology notes.  This care required high  level of medical decision making.    Annetta Killian, [facility] 12/14/2023, 10:50 AM Please see Amion for pager number during day hours 7:00am-4:30pm

## 2023-12-14 NOTE — Progress Notes (Signed)
   12/14/23 1350  Spiritual Encounters  Type of Visit Initial  Care provided to: Patient  Conversation partners present during encounter Nurse  Referral source Other (comment) (Spiritual Consult)  Reason for visit Advance directives  OnCall Visit No   Chaplain responded to a spiritual consult for advanced directive education. I met with the patient, [name], who asked that I leave the paperwork, I answered his questions pertaining to agent selection. He thanked me for the information and I advised him if he had anymore questions to reach out to his nurse and we would respond.   Clarence Croak [facility]  [phone]

## 2023-12-14 NOTE — Progress Notes (Addendum)
 Patient Name: [name] Date of Encounter: 12/14/2023 [facility] Cardiologist: Antoinette Batman, MD   Interval Summary  .    Lots of nausea, continues to have abd pain.   Vital Signs .    Vitals:   12/13/23 1819 12/13/23 2032 12/13/23 2348 12/14/23 0401  BP: 92/70 91/65 95/62  (!) 96/57  Pulse: 93 78  65  Resp: 17 18 18 17   Temp: 98.2 F (36.8 C) 98.2 F (36.8 C) 97.6 F (36.4 C) (!) 97.4 F (36.3 C)  TempSrc: Oral Oral Oral Oral  SpO2: 96% 98% 98% 97%  Weight:      Height:        Intake/Output Summary (Last 24 hours) at 12/14/2023 0731 Last data filed at 12/13/2023 2357 Gross per 24 hour  Intake --  Output 1100 ml  Net -1100 ml      12/11/2023   11:30 PM 12/11/2023   10:00 PM 06/28/2023    4:20 PM  Last 3 Weights  Weight (lbs) 160 lb 3.2 oz 182 lb 15.7 oz   Weight (kg) 72.666 kg 83 kg      Information is confidential and restricted. Go to Review Flowsheets to unlock data.      Telemetry/ECG    Sinus Rhythm, PACs - Personally Reviewed  Physical Exam .    GEN: No acute distress.   Neck: No JVD Cardiac: RRR, no murmurs, rubs, or gallops.  Respiratory: Clear to auscultation bilaterally. GI: Soft, upper quad tenderness  MS: No edema  Assessment & Plan .     48 year old male with past medical history of CAD status post 79v CABG '09 who presented with concerns for unstable angina.  Developed abdominal pain with transaminitis.  Unstable angina CAD status post CABG (LIMA-LAD, SVG-OM3, SVG-RI) -- Last cardiac cath 2013 with patent LIMA to LAD ( with kink, not flow limiting), SVG-OM3, occluded SVG to RI. -- Has been chest pain-free mostly this admission with negative troponins.  Complaints mostly related to severe abdominal pain with nausea/vomiting -- Treated with IV heparin  for total of 48 hours -- plavix  held, will add 81mg  ASA -- Echo pending, further recs for preop pending results   Abdominal pain Transaminitis Possible  cholecystitis -- AST/ALT 265>>101/242>>159, elevated alk phos and bilirubin 3.7 -- Statin has been held -- Underwent abdominal CT starting for cholecystitis and possibly pancreatitis with duct obstruction -- General Surgery consulted, recommendations for MRCP which showed periportal edema and small amount of pericholecystic fluid which is nonspecific in nature -- Right upper quadrant ultrasound with sludge in gallbladder, wall thickening and Perry cholecystic fluid findings suggestive of acute cholecystitis. Follow up with surgery recs  Hypotension -- intermittent episodes the past 2 days, but now improved with IVFs/stopping IV NTG -- will reduce losartan  to 25mg  daily   Hypokalemia -- K + 3.4, suppl  For questions or updates, please contact [facility] Please consult [email or web address] for contact info under        Signed, Johnie Nailer, NP    I have personally seen and examined this patient. I agree with the assessment and plan as outlined above.  Pt admitted with epigastric pain/abdominal pain. He is found to have acute cholecystitis. He has no evidence of acute coronary syndrome. EKG does not show ischemic changes. Echo pending today to assess LV function.  His presentation is most consistent with non cardiac cause of his epigastric pain, likely acute cholecystitis.  Labs reviewed by me.  EKG reviewed by me. Sinus, chronic  inferior abn My exam: NAD, RRR Lungs clear Ext: no LE edema.  Appreciate assistance of the surgical team. I suspect that his overall cardiac risk for a cholecystectomy is low. We will follow up on the results of the echo today.   Antoinette Batman, MD, [facility] 12/14/2023 9:27 AM

## 2023-12-14 NOTE — Plan of Care (Signed)
   Problem: Education: Goal: Knowledge of General Education information will improve Description Including pain rating scale, medication(s)/side effects and non-pharmacologic comfort measures Outcome: Progressing

## 2023-12-15 ENCOUNTER — Inpatient Hospital Stay (HOSPITAL_COMMUNITY)

## 2023-12-15 ENCOUNTER — Inpatient Hospital Stay (HOSPITAL_COMMUNITY): Admitting: Anesthesiology

## 2023-12-15 ENCOUNTER — Other Ambulatory Visit (HOSPITAL_COMMUNITY): Payer: Self-pay

## 2023-12-15 ENCOUNTER — Encounter (HOSPITAL_COMMUNITY)
Admission: EM | Discharge status: Against Medical Advice | Payer: Self-pay | Source: Home / Self Care | Attending: Cardiology

## 2023-12-15 ENCOUNTER — Other Ambulatory Visit: Payer: Self-pay

## 2023-12-15 ENCOUNTER — Encounter (HOSPITAL_COMMUNITY): Payer: Self-pay | Admitting: Student in an Organized Health Care Education/Training Program

## 2023-12-15 DIAGNOSIS — E876 Hypokalemia: Secondary | ICD-10-CM | POA: Diagnosis not present

## 2023-12-15 DIAGNOSIS — I2510 Atherosclerotic heart disease of native coronary artery without angina pectoris: Secondary | ICD-10-CM | POA: Diagnosis not present

## 2023-12-15 DIAGNOSIS — I959 Hypotension, unspecified: Secondary | ICD-10-CM | POA: Diagnosis not present

## 2023-12-15 DIAGNOSIS — F17210 Nicotine dependence, cigarettes, uncomplicated: Secondary | ICD-10-CM

## 2023-12-15 DIAGNOSIS — K810 Acute cholecystitis: Secondary | ICD-10-CM | POA: Diagnosis not present

## 2023-12-15 DIAGNOSIS — I200 Unstable angina: Secondary | ICD-10-CM | POA: Diagnosis not present

## 2023-12-15 DIAGNOSIS — I509 Heart failure, unspecified: Secondary | ICD-10-CM

## 2023-12-15 DIAGNOSIS — I110 Hypertensive heart disease with heart failure: Secondary | ICD-10-CM | POA: Diagnosis not present

## 2023-12-15 HISTORY — PX: CHOLECYSTECTOMY: SHX55

## 2023-12-15 LAB — SURGICAL PCR SCREEN
MRSA, PCR: NEGATIVE
Staphylococcus aureus: NEGATIVE

## 2023-12-15 LAB — COMPREHENSIVE METABOLIC PANEL WITH GFR
ALT: 117 U/L — ABNORMAL HIGH (ref 0–44)
AST: 51 U/L — ABNORMAL HIGH (ref 15–41)
Albumin: 2.5 g/dL — ABNORMAL LOW (ref 3.5–5.0)
Alkaline Phosphatase: 151 U/L — ABNORMAL HIGH (ref 38–126)
Anion gap: 8 (ref 5–15)
BUN: 16 mg/dL (ref 6–20)
CO2: 22 mmol/L (ref 22–32)
Calcium: 9.1 mg/dL (ref 8.9–10.3)
Chloride: 106 mmol/L (ref 98–111)
Creatinine, Ser: 0.74 mg/dL (ref 0.61–1.24)
GFR, Estimated: >60 mL/min (ref >60)
Glucose, Bld: 97 mg/dL (ref 70–99)
Potassium: 3.9 mmol/L (ref 3.5–5.1)
Sodium: 136 mmol/L (ref 135–145)
Total Bilirubin: 1.9 mg/dL — ABNORMAL HIGH (ref 0.0–1.2)
Total Protein: 5.8 g/dL — ABNORMAL LOW (ref 6.5–8.1)

## 2023-12-15 LAB — GLUCOSE, CAPILLARY
Glucose-Capillary: 64 mg/dL — ABNORMAL LOW (ref 70–99)
Glucose-Capillary: 76 mg/dL (ref 70–99)
Glucose-Capillary: 90 mg/dL (ref 70–99)
Glucose-Capillary: 77 mg/dL (ref 70–99)

## 2023-12-15 LAB — CBC
HCT: 33.9 % — ABNORMAL LOW (ref 39.0–52.0)
Hemoglobin: 11.5 g/dL — ABNORMAL LOW (ref 13.0–17.0)
MCH: 31.3 pg (ref 26.0–34.0)
MCHC: 33.9 g/dL (ref 30.0–36.0)
MCV: 92.1 fL (ref 80.0–100.0)
Platelets: 195 10*3/uL (ref 150–400)
RBC: 3.68 MIL/uL — ABNORMAL LOW (ref 4.22–5.81)
RDW: 15 % (ref 11.5–15.5)
WBC: 5.5 10*3/uL (ref 4.0–10.5)
nRBC: 0 % (ref 0.0–0.2)

## 2023-12-15 SURGERY — LAPAROSCOPIC CHOLECYSTECTOMY WITH INTRAOPERATIVE CHOLANGIOGRAM
Anesthesia: General | Site: Abdomen

## 2023-12-15 MED ORDER — LIDOCAINE 2% (20 MG/ML) 5 ML SYRINGE
INTRAMUSCULAR | Status: DC | PRN
Start: 2023-12-15 — End: 2023-12-16
  Administered 2023-12-15: 80 mg via INTRAVENOUS

## 2023-12-15 MED ORDER — PROPOFOL 10 MG/ML IV BOLUS
INTRAVENOUS | Status: AC
  Filled 2023-12-15: qty 20

## 2023-12-15 MED ORDER — CHLORHEXIDINE GLUCONATE 0.12 % MT SOLN
OROMUCOSAL | Status: AC
  Administered 2023-12-15: 15 mL via OROMUCOSAL
  Filled 2023-12-15: qty 15

## 2023-12-15 MED ORDER — SUGAMMADEX SODIUM 200 MG/2ML IV SOLN
INTRAVENOUS | Status: DC | PRN
  Administered 2023-12-15: 200 mg via INTRAVENOUS

## 2023-12-15 MED ORDER — HYDROMORPHONE HCL 1 MG/ML IJ SOLN
0.2500 mg | INTRAMUSCULAR | Status: DC | PRN
  Administered 2023-12-15 (×2): 0.5 mg via INTRAVENOUS

## 2023-12-15 MED ORDER — HYDROMORPHONE HCL 1 MG/ML IJ SOLN
INTRAMUSCULAR | Status: AC
  Filled 2023-12-15: qty 1

## 2023-12-15 MED ORDER — BUPIVACAINE-EPINEPHRINE (PF) 0.25% -1:200000 IJ SOLN
INTRAMUSCULAR | Status: AC
  Filled 2023-12-15: qty 30

## 2023-12-15 MED ORDER — LACTATED RINGERS IV SOLN: INTRAVENOUS | Status: DC

## 2023-12-15 MED ORDER — DEXTROSE 50 % IV SOLN
12.5000 g | INTRAVENOUS | Status: AC
  Filled 2023-12-15: qty 50

## 2023-12-15 MED ORDER — FENTANYL CITRATE (PF) 250 MCG/5ML IJ SOLN
INTRAMUSCULAR | Status: AC
  Filled 2023-12-15: qty 5

## 2023-12-15 MED ORDER — ONDANSETRON HCL 4 MG/2ML IJ SOLN
INTRAMUSCULAR | Status: DC | PRN
Start: 2023-12-15 — End: 2023-12-16
  Administered 2023-12-15: 4 mg via INTRAVENOUS

## 2023-12-15 MED ORDER — HYDROMORPHONE HCL 1 MG/ML IJ SOLN
INTRAMUSCULAR | Status: AC
Start: 2023-12-15 — End: ?
  Filled 2023-12-15: qty 0.5

## 2023-12-15 MED ORDER — GLUCAGON HCL RDNA (DIAGNOSTIC) 1 MG IJ SOLR
INTRAMUSCULAR | Status: AC
  Filled 2023-12-15: qty 1

## 2023-12-15 MED ORDER — PHENYLEPHRINE HCL (PRESSORS) 10 MG/ML IV SOLN
INTRAVENOUS | Status: DC | PRN
  Administered 2023-12-15: 160 ug via INTRAVENOUS

## 2023-12-15 MED ORDER — CHLORHEXIDINE GLUCONATE 0.12 % MT SOLN: 15.0000 mL | Freq: Once | OROMUCOSAL | Status: AC

## 2023-12-15 MED ORDER — BUPIVACAINE-EPINEPHRINE 0.25% -1:200000 IJ SOLN
INTRAMUSCULAR | Status: DC | PRN
  Administered 2023-12-15: 14 mL

## 2023-12-15 MED ORDER — PROPOFOL 10 MG/ML IV BOLUS
INTRAVENOUS | Status: DC | PRN
Start: 2023-12-15 — End: 2023-12-16
  Administered 2023-12-15: 150 mg via INTRAVENOUS

## 2023-12-15 MED ORDER — ROCURONIUM BROMIDE 10 MG/ML (PF) SYRINGE
PREFILLED_SYRINGE | INTRAVENOUS | Status: DC | PRN
Start: 2023-12-15 — End: 2023-12-16
  Administered 2023-12-15: 70 mg via INTRAVENOUS
  Administered 2023-12-15: 10 mg via INTRAVENOUS

## 2023-12-15 MED ORDER — HYDROMORPHONE HCL 1 MG/ML IJ SOLN
INTRAMUSCULAR | Status: DC | PRN
  Administered 2023-12-15: .5 mg via INTRAVENOUS

## 2023-12-15 MED ORDER — FENTANYL CITRATE (PF) 250 MCG/5ML IJ SOLN
INTRAMUSCULAR | Status: DC | PRN
  Administered 2023-12-15 (×3): 50 ug via INTRAVENOUS
  Administered 2023-12-15: 100 ug via INTRAVENOUS

## 2023-12-15 MED ORDER — SODIUM CHLORIDE 0.9 % IR SOLN
Status: DC | PRN
Start: 2023-12-15 — End: 2023-12-15
  Administered 2023-12-15: 1000 mL

## 2023-12-15 MED ORDER — DEXAMETHASONE SODIUM PHOSPHATE 10 MG/ML IJ SOLN
INTRAMUSCULAR | Status: DC | PRN
Start: 2023-12-15 — End: 2023-12-16
  Administered 2023-12-15: 10 mg via INTRAVENOUS

## 2023-12-15 MED ORDER — 0.9 % SODIUM CHLORIDE (POUR BTL) OPTIME
TOPICAL | Status: DC | PRN
Start: 2023-12-15 — End: 2023-12-15
  Administered 2023-12-15: 1000 mL

## 2023-12-15 MED ORDER — DEXTROSE 50 % IV SOLN
INTRAVENOUS | Status: AC
  Administered 2023-12-15: 12.5 g via INTRAVENOUS
  Filled 2023-12-15: qty 50

## 2023-12-15 MED ORDER — IOPAMIDOL (ISOVUE-300) INJECTION 61%
INTRAVENOUS | Status: DC | PRN
  Administered 2023-12-15: 43 mL

## 2023-12-15 MED ORDER — ORAL CARE MOUTH RINSE: 15.0000 mL | Freq: Once | OROMUCOSAL | Status: AC

## 2023-12-15 MED ORDER — LACTATED RINGERS IV SOLN: INTRAVENOUS | Status: DC | PRN

## 2023-12-15 MED ORDER — ONDANSETRON HCL 4 MG/2ML IJ SOLN: 4.0000 mg | Freq: Once | INTRAMUSCULAR | Status: DC | PRN

## 2023-12-15 SURGICAL SUPPLY — 36 items
BAG COUNTER SPONGE SURGICOUNT (BAG) ×1 IMPLANT
BLADE CLIPPER SURG (BLADE) IMPLANT
CANISTER SUCTION 3000ML PPV (SUCTIONS) ×1 IMPLANT
CHLORAPREP W/TINT 26 (MISCELLANEOUS) ×1 IMPLANT
CLIP APPLIE 5 13 M/L LIGAMAX5 (MISCELLANEOUS) ×1 IMPLANT
CLIP LIGATING HEMO O LOK GREEN (MISCELLANEOUS) IMPLANT
COVER MAYO STAND STRL (DRAPES) ×1 IMPLANT
COVER SURGICAL LIGHT HANDLE (MISCELLANEOUS) ×1 IMPLANT
DERMABOND ADVANCED .7 DNX12 (GAUZE/BANDAGES/DRESSINGS) ×1 IMPLANT
DISSECTOR BLUNT TIP ENDO 5MM (MISCELLANEOUS) IMPLANT
DRAPE C-ARM 42X120 X-RAY (DRAPES) ×1 IMPLANT
ELECTRODE REM PT RTRN 9FT ADLT (ELECTROSURGICAL) ×1 IMPLANT
GLOVE BIO SURGEON STRL SZ7.5 (GLOVE) ×1 IMPLANT
GLOVE INDICATOR 8.0 STRL GRN (GLOVE) ×1 IMPLANT
GOWN STRL REUS W/ TWL LRG LVL3 (GOWN DISPOSABLE) ×2 IMPLANT
GOWN STRL REUS W/ TWL XL LVL3 (GOWN DISPOSABLE) ×1 IMPLANT
IRRIGATION SUCT STRKRFLW 2 WTP (MISCELLANEOUS) ×1 IMPLANT
KIT BASIN OR (CUSTOM PROCEDURE TRAY) ×1 IMPLANT
KIT IMAGING PINPOINTPAQ (MISCELLANEOUS) IMPLANT
KIT TURNOVER KIT B (KITS) ×1 IMPLANT
NS IRRIG 1000ML POUR BTL (IV SOLUTION) ×1 IMPLANT
PAD ARMBOARD POSITIONER FOAM (MISCELLANEOUS) ×1 IMPLANT
PENCIL BUTTON HOLSTER BLD 10FT (ELECTRODE) ×1 IMPLANT
SCISSORS LAP 5X35 DISP (ENDOMECHANICALS) ×1 IMPLANT
SET CHOLANGIOGRAPH 5 50 .035 (SET/KITS/TRAYS/PACK) ×1 IMPLANT
SET TUBE SMOKE EVAC HIGH FLOW (TUBING) ×1 IMPLANT
SLEEVE ADV FIXATION 5X100MM (TROCAR) IMPLANT
SUT MNCRL AB 4-0 PS2 18 (SUTURE) ×1 IMPLANT
SYSTEM BAG RETRIEVAL 10MM (BASKET) IMPLANT
TOWEL GREEN STERILE (TOWEL DISPOSABLE) ×1 IMPLANT
TOWEL GREEN STERILE FF (TOWEL DISPOSABLE) ×1 IMPLANT
TRAY LAPAROSCOPIC MC (CUSTOM PROCEDURE TRAY) ×1 IMPLANT
TROCAR ADV FIXATION 5X100MM (TROCAR) ×3 IMPLANT
TROCAR BALLN 12MMX100 BLUNT (TROCAR) ×1 IMPLANT
WARMER LAPAROSCOPE (MISCELLANEOUS) ×1 IMPLANT
WATER STERILE IRR 1000ML POUR (IV SOLUTION) ×1 IMPLANT

## 2023-12-15 NOTE — Progress Notes (Addendum)
 Progress Note  * Day of Surgery *  Subjective: Pt reports ongoing RUQ abdominal pain and nausea although better than yesterday. No other complaints to report  Objective: Vital signs in last 24 hours: Temp:  [97.5 F (36.4 C)-97.7 F (36.5 C)] 97.5 F (36.4 C) (05/28 1125) Pulse Rate:  [65-80] 73 (05/28 1125) Resp:  [18-20] 18 (05/28 1125) BP: (104-122)/(75-84) 117/81 (05/28 1125) SpO2:  [96 %-100 %] 96 % (05/28 1125) Weight:  [72.7 kg] 72.7 kg (05/28 1125) Last BM Date : 12/12/23  Intake/Output from previous day: 05/27 0701 - 05/28 0700 In: 377 [I.V.:76.6; IV Piggyback:300.4] Out: -  Intake/Output this shift: Total I/O In: 200 [IV Piggyback:200] Out: -   PE: General: pleasant, WD, WN male who is laying in bed in NAD HEENT: sclera anicteric Heart: regular, rate, and rhythm.   Lungs: Respiratory effort nonlabored Abd: soft, TTP in RUQ, ND Psych: A&Ox3 with an appropriate affect.    Lab Results:  Recent Labs    12/14/23 0422 12/15/23 0334  WBC 8.1 5.5  HGB 11.3* 11.5*  HCT 32.5* 33.9*  PLT 220 195   BMET Recent Labs    12/14/23 0422 12/15/23 0334  NA 136 136  K 3.4* 3.9  CL 106 106  CO2 21* 22  GLUCOSE 91 97  BUN 15 16  CREATININE 0.74 0.74  CALCIUM  8.7* 9.1   PT/INR Recent Labs    12/14/23 0830  LABPROT 13.7  INR 1.0   CMP     Component Value Date/Time   NA 136 12/15/2023 0334   NA 139 02/18/2018 1026   K 3.9 12/15/2023 0334   CL 106 12/15/2023 0334   CO2 22 12/15/2023 0334   GLUCOSE 97 12/15/2023 0334   BUN 16 12/15/2023 0334   BUN 11 02/18/2018 1026   CREATININE 0.74 12/15/2023 0334   CREATININE 1.05 07/25/2018 1446   CALCIUM  9.1 12/15/2023 0334   PROT 5.8 (L) 12/15/2023 0334   PROT 7.4 02/18/2018 1026   ALBUMIN 2.5 (L) 12/15/2023 0334   ALBUMIN 4.8 02/18/2018 1026   AST 51 (H) 12/15/2023 0334   ALT 117 (H) 12/15/2023 0334   ALKPHOS 151 (H) 12/15/2023 0334   BILITOT 1.9 (H) 12/15/2023 0334   BILITOT 0.4 02/18/2018 1026    GFRNONAA >60 12/15/2023 0334   GFRNONAA 87 07/25/2018 1446   GFRAA >60 08/07/2019 1706   GFRAA 101 07/25/2018 1446   Lipase     Component Value Date/Time   LIPASE 44 12/13/2023 0435       Studies/Results: ECHOCARDIOGRAM COMPLETE Result Date: 12/14/2023    ECHOCARDIOGRAM REPORT   Patient Name:   [name] Date of Exam: 12/14/2023 Medical Rec #:  [id number]        Height:       70.0 in Accession #:    [id number]       Weight:       160.2 lb Date of Birth:  1976-02-13        BSA:          1.899 m Patient Age:    47 years         BP:           97/73 mmHg Patient Gender: M                HR:           68 bpm. Exam Location:  Inpatient Procedure: 2D Echo, Cardiac Doppler, Color Doppler and Intracardiac  Opacification Agent (Both Spectral and Color Flow Doppler were            utilized during procedure). Indications:    Chest Pain  History:        Patient has no prior history of Echocardiogram examinations.                 CHF; Risk Factors:Current Smoker and Hypertension.  Sonographer:    Willey Harrier Referring Phys: [id number] ANGELA NICOLE DUKE IMPRESSIONS  1. Left ventricular ejection fraction, by estimation, is 40 to 45%. The left ventricle has mildly decreased function. The left ventricle demonstrates global hypokinesis. Left ventricular diastolic parameters are indeterminate.  2. Right ventricular systolic function is normal. The right ventricular size is mildly enlarged. There is normal pulmonary artery systolic pressure. The estimated right ventricular systolic pressure is 20.1 mmHg.  3. The mitral valve is grossly normal. Trivial mitral valve regurgitation. No evidence of mitral stenosis.  4. The aortic valve is tricuspid. Aortic valve regurgitation is not visualized. No aortic stenosis is present.  5. Aortic dilatation noted. Aneurysm of the ascending aorta, measuring 50 mm.  6. The inferior vena cava is dilated in size with >50% respiratory variability, suggesting right atrial  pressure of 8 mmHg. FINDINGS  Left Ventricle: Left ventricular ejection fraction, by estimation, is 40 to 45%. The left ventricle has mildly decreased function. The left ventricle demonstrates global hypokinesis. The left ventricular internal cavity size was normal in size. There is  no left ventricular hypertrophy. Abnormal (paradoxical) septal motion consistent with post-operative status. Left ventricular diastolic parameters are indeterminate. Right Ventricle: The right ventricular size is mildly enlarged. No increase in right ventricular wall thickness. Right ventricular systolic function is normal. There is normal pulmonary artery systolic pressure. The tricuspid regurgitant velocity is 1.74  m/s, and with an assumed right atrial pressure of 8 mmHg, the estimated right ventricular systolic pressure is 20.1 mmHg. Left Atrium: Left atrial size was normal in size. Right Atrium: Right atrial size was normal in size. Pericardium: There is no evidence of pericardial effusion. Mitral Valve: The mitral valve is grossly normal. Trivial mitral valve regurgitation. No evidence of mitral valve stenosis. MV peak gradient, 2.3 mmHg. The mean mitral valve gradient is 1.0 mmHg. Tricuspid Valve: The tricuspid valve is grossly normal. Tricuspid valve regurgitation is trivial. No evidence of tricuspid stenosis. Aortic Valve: The aortic valve is tricuspid. Aortic valve regurgitation is not visualized. No aortic stenosis is present. Aortic valve peak gradient measures 4.7 mmHg. Pulmonic Valve: The pulmonic valve was grossly normal. Pulmonic valve regurgitation is trivial. No evidence of pulmonic stenosis. Aorta: Aortic dilatation noted and the aortic root is normal in size and structure. There is an aneurysm involving the ascending aorta measuring 50 mm. Venous: The right lower pulmonary vein is normal. The inferior vena cava is dilated in size with greater than 50% respiratory variability, suggesting right atrial pressure of 8  mmHg. IAS/Shunts: The atrial septum is grossly normal.  LEFT VENTRICLE PLAX 2D LVIDd:         5.50 cm   Diastology LVIDs:         4.70 cm   LV e' medial:    7.94 cm/s LV PW:         0.90 cm   LV E/e' medial:  9.8 LV IVS:        1.10 cm   LV e' lateral:   12.10 cm/s LVOT diam:     2.20 cm   LV E/e' lateral:  6.4 LV SV:         63 LV SV Index:   33 LVOT Area:     3.80 cm  RIGHT VENTRICLE            IVC RV Basal diam:  4.20 cm    IVC diam: 2.10 cm RV S prime:     8.05 cm/s TAPSE (M-mode): 2.1 cm LEFT ATRIUM             Index        RIGHT ATRIUM           Index LA Vol (A2C):   60.3 ml 31.75 ml/m  RA Area:     15.10 cm LA Vol (A4C):   54.4 ml 28.64 ml/m  RA Volume:   40.30 ml  21.22 ml/m LA Biplane Vol: 57.2 ml 30.11 ml/m  AORTIC VALVE                 PULMONIC VALVE AV Area (Vmax): 2.96 cm     PR End Diast Vel: 5.02 msec AV Vmax:        108.00 cm/s AV Peak Grad:   4.7 mmHg LVOT Vmax:      84.20 cm/s LVOT Vmean:     62.200 cm/s LVOT VTI:       0.165 m  AORTA Ao Root diam: 4.00 cm Ao Asc diam:  5.00 cm MITRAL VALVE               TRICUSPID VALVE MV Area (PHT): 3.53 cm    TR Peak grad:   12.1 mmHg MV Area VTI:   2.18 cm    TR Vmax:        174.00 cm/s MV Peak grad:  2.3 mmHg MV Mean grad:  1.0 mmHg    SHUNTS MV Vmax:       0.75 m/s    Systemic VTI:  0.16 m MV Vmean:      54.8 cm/s   Systemic Diam: 2.20 cm MV Decel Time: 215 msec MV E velocity: 77.50 cm/s MV A velocity: 86.90 cm/s MV E/A ratio:  0.89 Jackquelyn Mass MD Electronically signed by Jackquelyn Mass MD Signature Date/Time: 12/14/2023/3:38:26 PM    Final    US  Abdomen Limited RUQ (LIVER/GB) Result Date: 12/14/2023 CLINICAL DATA:  Abdominal pain and elevated liver enzymes. EXAM: ULTRASOUND ABDOMEN LIMITED RIGHT UPPER QUADRANT COMPARISON:  Same day MRI abdomen and CT abdomen pelvis FINDINGS: Gallbladder: Sludge in the gallbladder. The gallbladder wall is thickened measuring 5 mm. There is pericholecystic fluid. No sonographic Murphy sign noted by sonographer.  Common bile duct: Diameter: 5 mm.  No intrahepatic biliary dilation. Liver: No focal lesion identified. Within normal limits in parenchymal echogenicity. Portal vein is patent on color Doppler imaging with normal direction of blood flow towards the liver. Other: None. IMPRESSION: Sludge in the gallbladder with wall thickening and pericholecystic fluid. Negative sonographic Murphy's sign. Findings suggestive acute cholecystitis. Electronically Signed   By: Rozell Cornet M.D.   On: 12/14/2023 00:06   MR ABDOMEN MRCP W WO CONTAST Result Date: 12/13/2023 CLINICAL DATA:  Abdominal pain. Elevated liver enzymes. Biliary ductal dilatation on recent CT. EXAM: MRI ABDOMEN WITHOUT AND WITH CONTRAST (INCLUDING MRCP) TECHNIQUE: Multiplanar multisequence MR imaging of the abdomen was performed both before and after the administration of intravenous contrast. Heavily T2-weighted images of the biliary and pancreatic ducts were obtained, and three-dimensional MRCP images were rendered by post processing. CONTRAST:  7mL GADAVIST GADOBUTROL 1 MMOL/ML IV SOLN COMPARISON:  CT on 12/13/2023 FINDINGS: Lower chest: No acute findings. Hepatobiliary: No hepatic masses identified. A few tiny filling defects in the gallbladder lumen are suspicious for tiny gallstones. No evidence of gallbladder wall thickening or dilatation, however, there is small amount of pericholecystic fluid. Periportal edema is seen within the liver, however, there is no evidence of biliary ductal dilatation or choledocholithiasis. Common bile duct measures 6 mm in diameter. Pancreas: No mass or inflammatory changes. No evidence of pancreatic ductal dilatation or pancreas divisum. Spleen:  Within normal limits in size and appearance. Adrenals/Urinary Tract: No suspicious masses identified. No evidence of hydronephrosis. Stomach/Bowel: Unremarkable. Vascular/Lymphatic: No pathologically enlarged lymph nodes identified. No acute vascular findings. Other:  None.  Musculoskeletal:  No suspicious bone lesions identified. IMPRESSION: Periportal edema and small amount of pericholecystic fluid, which are nonspecific and of uncertain etiology. No evidence of biliary ductal dilatation or choledocholithiasis. Probable tiny gallstones. No definite radiographic evidence of acute cholecystitis. Consider ultrasound for further evaluation. Electronically Signed   By: Marlyce Sine M.D.   On: 12/13/2023 19:36   MR 3D Recon At Scanner Result Date: 12/13/2023 CLINICAL DATA:  Abdominal pain. Elevated liver enzymes. Biliary ductal dilatation on recent CT. EXAM: MRI ABDOMEN WITHOUT AND WITH CONTRAST (INCLUDING MRCP) TECHNIQUE: Multiplanar multisequence MR imaging of the abdomen was performed both before and after the administration of intravenous contrast. Heavily T2-weighted images of the biliary and pancreatic ducts were obtained, and three-dimensional MRCP images were rendered by post processing. CONTRAST:  7mL GADAVIST GADOBUTROL 1 MMOL/ML IV SOLN COMPARISON:  CT on 12/13/2023 FINDINGS: Lower chest: No acute findings. Hepatobiliary: No hepatic masses identified. A few tiny filling defects in the gallbladder lumen are suspicious for tiny gallstones. No evidence of gallbladder wall thickening or dilatation, however, there is small amount of pericholecystic fluid. Periportal edema is seen within the liver, however, there is no evidence of biliary ductal dilatation or choledocholithiasis. Common bile duct measures 6 mm in diameter. Pancreas: No mass or inflammatory changes. No evidence of pancreatic ductal dilatation or pancreas divisum. Spleen:  Within normal limits in size and appearance. Adrenals/Urinary Tract: No suspicious masses identified. No evidence of hydronephrosis. Stomach/Bowel: Unremarkable. Vascular/Lymphatic: No pathologically enlarged lymph nodes identified. No acute vascular findings. Other:  None. Musculoskeletal:  No suspicious bone lesions identified. IMPRESSION:  Periportal edema and small amount of pericholecystic fluid, which are nonspecific and of uncertain etiology. No evidence of biliary ductal dilatation or choledocholithiasis. Probable tiny gallstones. No definite radiographic evidence of acute cholecystitis. Consider ultrasound for further evaluation. Electronically Signed   By: Marlyce Sine M.D.   On: 12/13/2023 19:36    Anti-infectives: Anti-infectives (From admission, onward)    Start     Dose/Rate Route Frequency Ordered Stop   12/14/23 0915  [MAR Hold]  metroNIDAZOLE (FLAGYL) IVPB 500 mg        (MAR Hold since Wed 12/15/2023 at 1121.Hold Reason: Transfer to a Procedural area)   500 mg 100 mL/hr over 60 Minutes Intravenous Every 12 hours 12/14/23 0825     12/14/23 0900  [MAR Hold]  cefTRIAXone  (ROCEPHIN ) 2 g in sodium chloride  0.9 % 100 mL IVPB        (MAR Hold since Wed 12/15/2023 at 1121.Hold Reason: Transfer to a Procedural area)   2 g 200 mL/hr over 30 Minutes Intravenous Every 24 hours 12/14/23 0802     12/13/23 1525  cefTRIAXone  (ROCEPHIN ) 1 g in sodium chloride  0.9 % 100 mL IVPB  Status:  Discontinued  1 g 200 mL/hr over 30 Minutes Intravenous Every 12 hours 12/13/23 1525 12/14/23 0802        Assessment/Plan  Acute cholecystitis  - CT 5/26 with distention of gallbladder and pericholecystic inflammation, intra and extrahepatic ductal dilatation with CBD 12 mm - MRCP 5/26 without choledocholithiasis and no evidence of biliary ductal dilatation, periportal edema and pericholecystic fluid and probable small gallstones - RUQ US  this AM with sludge in the gallbladder with wall thickening and pericholecystic fluid, suggestive of acute cholecystitis  - Tbili normalizing - WBC 8 from 12.3 yesterday and pt with persistent RUQ pain and nausea - Cleared by cardiology  FEN: NPO for surgery VTE: SQH, continue to hold Plavix   ID: rocephin /flagyl 5/26>>  - per primary attending -  CAD s/p CABG and stenting on plavix  (LD 5/24) CHF -  ECHO completed HTN HLD Hx of alcohol abuse  -The anatomy and physiology of the hepatobiliary system was discussed with the patient. The pathophysiology of gallbladder disease was then reviewed as well. -The options for treatment were discussed including ongoing observation which may result in subsequent gallbladder complications (infection, pancreatitis, choledocholithiasis, etc), drainage procedures, and surgery - laparoscopic cholecystectomy -The planned procedure, material risks (including, but not limited to, pain, bleeding, infection, scarring, need for blood transfusion, damage to surrounding structures- blood vessels/nerves/viscus/organs, damage to bile duct, bile leak, chronic diarrhea, conversion to a 'subtotal' cholecystectomy and general expectations therein, post-cholecystectomy diarrhea, potential need for additional procedures including EGD/ERCP, hernia, worsening of pre-existing medical conditions, pancreatitis, pneumonia, heart attack, stroke, death) benefits and alternatives to surgery were discussed at length. We have noted a good probability that the procedure would help improve their symptoms. The patient's questions were answered to his satisfaction, he voiced understanding and elected to proceed with surgery. Additionally, we discussed typical postoperative expectations and the recovery process.   LOS: 4 days   I reviewed last 24 h vitals and pain scores, last 48 h intake and output, last 24 h labs and trends, last 24 h imaging results, and cardiology notes.  This care required high  level of medical decision making.   I spent a total of 50 minutes today in both face-to-face and non-face-to-face activities to perform the following: review records, take and update history, examine the patient, counsel the patient on the diagnosis, and document encounter, findings, and plan in the EHR  for this visit on the date of this encounter.   Melvenia Stabs, MD  [facility]  Surgery 12/15/2023, 1:30 PM Please see Amion for pager number during day hours 7:00am-4:30pm

## 2023-12-15 NOTE — Plan of Care (Signed)
  Problem: Activity: Goal: Risk for activity intolerance will decrease Outcome: Progressing   Problem: Pain Managment: Goal: General experience of comfort will improve and/or be controlled Outcome: Progressing   Problem: Safety: Goal: Ability to remain free from injury will improve Outcome: Progressing   Problem: Skin Integrity: Goal: Risk for impaired skin integrity will decrease Outcome: Progressing

## 2023-12-15 NOTE — Discharge Instructions (Signed)

## 2023-12-15 NOTE — Plan of Care (Signed)

## 2023-12-15 NOTE — Op Note (Signed)
 12/15/2023 3:16 PM  PATIENT: [name]  48 y.o. male  Patient Care Team: Forest Idol, NP as PCP - General (Nurse Practitioner) Odie Benne, MD as PCP - Cardiology (Cardiology)  PRE-OPERATIVE DIAGNOSIS: Acute cholecystitis vs choledocholithiasis  POST-OPERATIVE DIAGNOSIS: Suspected choledocholithiasis  PROCEDURE: Laparoscopic cholecystectomy with intraoperative cholangiogram  SURGEON: Beatris Lincoln, MD  ASSISTANT: OR Staff  ANESTHESIA: General endotracheal  EBL: 15 mL  DRAINS: None  SPECIMEN: Gallbladder  COUNTS: Sponge, needle and instrument counts were reported correct x2 at the conclusion of the operation  DISPOSITION: PACU in satisfactory condition  COMPLICATIONS: None  FINDINGS: Liver grossly normal in appearance.  Gallbladder with minimal inflammation.  Intraoperative cholangiogram demonstrates suspected debris within the distal common bile duct.  This does empty into the duodenum fairly freely but there is a significant amount of debris that is I suspect partially obstructing intermittently.  Critical view of safety achieved prior to clipping or dividing any structures.  DESCRIPTION:   The patient was identified & brought into the operating room. He was then positioned supine on the OR table. SCDs were in place and active during the entire case. He then underwent general endotracheal anesthesia. Pressure points were padded. Hair on the abdomen was clipped by the OR team. The abdomen was prepped and draped in the standard sterile fashion. Antibiotics were administered. A surgical timeout was performed and confirmed our plan.   A periumbilical incision was made. The umbilical stalk was grasped and retracted outwardly. The supraumbilical fascia was identified and incised. The peritoneal cavity was gently entered bluntly. A purse-string 0 Vicryl suture was placed. The Hasson cannula was inserted into the peritoneal cavity and insufflation with CO2  commenced to . A laparoscope was inserted into the peritoneal cavity and inspection confirmed no evidence of trocar site complications. The patient was then positioned in reverse Trendelenburg with slight left side down. 3 additional 5mm trocars were placed along the right subcostal line - one 5mm port in mid subcostal region, another 5mm port in the right flank near the anterior axillary line, and a third 5mm port in the left subxiphoid region obliquely near the falciform ligament.  The liver and gallbladder were inspected.  The liver was normal in appearance.  The gallbladder was mildly inflamed in appearance.  There were no significant adhesions to the gallbladder. The gallbladder fundus was grasped and elevated cephalad. An additional grasper was then placed on the infundibulum of the gallbladder and the infundibulum was retracted laterally. Staying high on the gallbladder, the peritoneum on both sides of the gallbladder was opened with hook cautery. Gentle blunt dissection was then employed with a [place]  dissector working down into [employer]. The cystic duct was identified and carefully circumferentially dissected. The cystic artery was also identified and carefully circumferentially dissected. The space between the cystic artery and hepatocystic plate was developed such that a good view of the liver could be seen through a window medial to the cystic artery. The triangle of Calot had been cleared of all fibrofatty tissue. At this point, a critical view of safety was achieved and the only structures visualized was the skeletonized cystic duct laterally, the skeletonized cystic artery and the liver through the window medial to the artery.  A diminutive posterior cystic artery was noted  Near infrared cholangiography did demonstrate drainage into the biliary system and filling of the cystic duct as well as the gallbladder.  ICG activity was also seen through the wall of the duodenum.  No ICG  activity  seen within our candidate cystic artery or the diminutive posterior branch.  At this point attention was turned to performing a cholangiogram. A partial cystic duct-otomy was created. A 72F cholangiocatheter was then introduced through a puncture site at the right subcostal ridge of the abdominal wall and directed it into the cystic duct. This was then secured with a clip. A cholangiogram was then obtained using a dilute radio-opaque contrast and continuous fluoroscopy.  Contrast flowed from a sidebranch consistent with cystic duct cannulization.  Contrast flowed proximally up the common hepatic duct into the right and left intrahepatic chains out to secondary radicals.  Contrast flowed distally to the common bile duct and across the ampulla into the duodenum.  There was a significant mount of debris/stones present in the distal common bile duct but these do not appear to be occlusive in nature.  With flushing, they did not appear to pass. The cholangiocatheter was then removed.  The cystic duct and artery were clipped with 2 clips on the patient side and 1 clip on the specimen side. The cystic duct and artery were then divided. The gallbladder was then freed from its remaining attachments to the liver using electrocautery and placed into an endocatch bag.  Of note, there were 2 small branches off of a vessel running along the hepatocystic plate that we were able to preserve but these branches were feeding the gallbladder and on the gallbladder itself, these were controlled with clips. There was no ICG activity seen within these to suggest "ducts of L[place]chka."  There was some spillage of bile during removal or a small rent was created in the gallbladder. No stones were spilled. The RUQ was gently irrigated with sterile saline. Hemostasis was then verified. The clips were in good position; the gallbladder fossa was dry.The rest of the abdomen was inspected no injury nor bleeding elsewhere was  identified.  The endocatch bag containing the gallbladder was then removed from the umbilical port site and passed off as specimen. The RUQ ports were removed under direct visualization and noted to be hemostatic. The umbilical fascia was then closed using the 0 Vicryl purse-string suture. The fascia was palpated and noted to be completely closed. The skin of all incision sites was approximated with 4-0 monocryl subcuticular suture and dermabond applied. He was then awakened from anesthesia, extubated, and transferred to a stretcher for transport to PACU in satisfactory condition.

## 2023-12-15 NOTE — Progress Notes (Addendum)
 Patient Name: [name] Date of Encounter: 12/15/2023 [facility] Cardiologist: Antoinette Batman, MD   Interval Summary  .    Still with abd pain and nausea this morning. No chest pain.   Vital Signs .    Vitals:   12/14/23 2009 12/14/23 2351 12/15/23 0418 12/15/23 0729  BP: 122/81 113/75 106/75 114/79  Pulse: 65 68 80 77  Resp: 19 20 19 18   Temp: (!) 97.5 F (36.4 C) (!) 97.5 F (36.4 C) (!) 97.5 F (36.4 C) (!) 97.5 F (36.4 C)  TempSrc: Oral Oral Oral Oral  SpO2: 100% 99% 100% 100%  Weight:      Height:        Intake/Output Summary (Last 24 hours) at 12/15/2023 0750 Last data filed at 12/15/2023 0622 Gross per 24 hour  Intake 377.01 ml  Output --  Net 377.01 ml      12/11/2023   11:30 PM 12/11/2023   10:00 PM 06/28/2023    4:20 PM  Last 3 Weights  Weight (lbs) 160 lb 3.2 oz 182 lb 15.7 oz   Weight (kg) 72.666 kg 83 kg      Information is confidential and restricted. Go to Review Flowsheets to unlock data.      Telemetry/ECG    Sinus Rhythm - Personally Reviewed  Physical Exam .    GEN: No acute distress.   Neck: No JVD Cardiac: RRR, no murmurs, rubs, or gallops.  Respiratory: Clear to auscultation bilaterally. GI: Soft, tender, non-distended  MS: No edema  Assessment & Plan .     48 year old male with past medical history of CAD status post 35v CABG '09 who presented with concerns for unstable angina.  Developed abdominal pain with transaminitis.   Unstable angina CAD status post CABG (LIMA-LAD, SVG-OM3, SVG-RI) HFmrEF -- Last cardiac cath 2013 with patent LIMA to LAD ( with kink, not flow limiting), SVG-OM3, occluded SVG to RI. -- Has been chest pain-free mostly this admission with negative troponins.  Complaints mostly related to severe abdominal pain with nausea/vomiting -- Treated with IV heparin  for total of 48 hours -- plavix  held, continue 81mg  ASA -- Echo 5/28 showed LVEF of 40-45%, global hypokinesis, normal RV,  normal PA pressure, trivial MR. No plans for further cardiac testing at this time. Continue losartan , can consider adding low dose BB prior to discharge    Abdominal pain Transaminitis Possible cholecystitis -- AST/ALT 265>>101>>51/242>>159>>117, elevated alk phos and bilirubin 3.7 -- Statin has been held -- Underwent abdominal CT starting for cholecystitis and possibly pancreatitis with duct obstruction -- General Surgery consulted, recommendations for MRCP which showed periportal edema and small amount of pericholecystic fluid which is nonspecific in nature -- Right upper quadrant ultrasound with sludge in gallbladder, wall thickening and Perry cholecystic fluid findings suggestive of acute cholecystitis. -- pending surgery   Hypotension -- intermittent episodes the past 2 days, but now improved with IVFs/stopping IV NTG -- continue losartan  to 25mg  daily    Hypokalemia --resolved   For questions or updates, please contact [facility] Please consult [email or web address] for contact info under        Signed, Johnie Nailer, NP   I have personally seen and examined this patient. I agree with the assessment and plan as outlined above.  48 yo male with CAD s/p CABG admitted with epigastric pain. No evidence of ACS. He was found to have acute cholecystitis. Going to the OR today for lap chole.  BP stable today.  Labs  reviewed. Sinus on tele My exam:Abd tenderness to palpation. CV:RRR Lungs: clear bilaterally Ext: no LE edema Plan: No changes in plan for stable CAD. To OR today for lap chole.   Antoinette Batman, MD, [facility] 12/15/2023 8:50 AM

## 2023-12-15 NOTE — Transfer of Care (Signed)
 Immediate Anesthesia Transfer of Care Note  Patient: [name]  Procedure(s) Performed: LAPAROSCOPIC CHOLECYSTECTOMY WITH INTRAOPERATIVE CHOLANGIOGRAM (Abdomen)  Patient Location: PACU  Anesthesia Type:General  Level of Consciousness: awake, alert , and oriented  Airway & Oxygen Therapy: Patient Spontanous Breathing and Patient connected to nasal cannula oxygen  Post-op Assessment: Report given to RN and Post -op Vital signs reviewed and stable  Post vital signs: Reviewed and stable  Last Vitals:  Vitals Value Taken Time  BP 90/67 12/15/23 1533  Temp 36.5 C 12/15/23 1531  Pulse 90 12/15/23 1541  Resp 13 12/15/23 1541  SpO2 96 % 12/15/23 1541  Vitals shown include unfiled device data.  Last Pain:  Vitals:   12/15/23 1138  TempSrc:   PainSc: 7          Complications: No notable events documented.

## 2023-12-15 NOTE — Anesthesia Procedure Notes (Signed)
 Procedure Name: Intubation Date/Time: 12/15/2023 1:55 PM  Performed by: Loreda Rodriguez, CRNAPre-anesthesia Checklist: Patient identified, Emergency Drugs available, Suction available and Patient being monitored Patient Re-evaluated:Patient Re-evaluated prior to induction Oxygen Delivery Method: Circle System Utilized Preoxygenation: Pre-oxygenation with 100% oxygen Induction Type: IV induction Ventilation: Mask ventilation without difficulty Laryngoscope Size: Mac and 3 Grade View: Grade I Tube type: Oral Tube size: 7.0 mm Number of attempts: 1 Airway Equipment and Method: Stylet and Oral airway Placement Confirmation: ETT inserted through vocal cords under direct vision, positive ETCO2 and breath sounds checked- equal and bilateral Secured at: 21 cm Tube secured with: Tape Dental Injury: Teeth and Oropharynx as per pre-operative assessment

## 2023-12-15 NOTE — Progress Notes (Signed)
 Received update from nurse that patient requesting to change back to DNR status, has been DNR previously. I spoke with the patient who confirms this. We discussed what DNR means and he affirms this is what he wants. He states it is not a matter of any depression or anything, just that he has been through a lot and prefers to avoid resuscitation and pass peacefully if his condition worsens. He is A+Ox3 and confirmed his full name and DOB. Order updated in EMR.

## 2023-12-15 NOTE — Anesthesia Preprocedure Evaluation (Addendum)
 Anesthesia Evaluation  Patient identified by MRN, date of birth, ID band Patient awake    Reviewed: Allergy & Precautions, NPO status , Patient's Chart, lab work & pertinent test results, reviewed documented beta blocker date and time   History of Anesthesia Complications (+) history of anesthetic complications  Airway Mallampati: II  TM Distance: >3 FB Neck ROM: Full    Dental no notable dental hx. (+) Edentulous Upper, Dental Advisory Given, Poor Dentition   Pulmonary Current Smoker and Patient abstained from smoking.   Pulmonary exam normal breath sounds clear to auscultation       Cardiovascular hypertension, Pt. on medications and Pt. on home beta blockers + angina  + CAD, + Past MI, + Cardiac Stents, + CABG and +CHF  Normal cardiovascular exam Rhythm:Regular Rate:Normal  CABG x 4 2008  Echo 5/27 1. Left ventricular ejection fraction, by estimation, is 40 to 45%. The  left ventricle has mildly decreased function. The left ventricle  demonstrates global hypokinesis. Left ventricular diastolic parameters are  indeterminate.   2. Right ventricular systolic function is normal. The right ventricular  size is mildly enlarged. There is normal pulmonary artery systolic  pressure. The estimated right ventricular systolic pressure is 20.1 mmHg.   3. The mitral valve is grossly normal. Trivial mitral valve  regurgitation. No evidence of mitral stenosis.   4. The aortic valve is tricuspid. Aortic valve regurgitation is not  visualized. No aortic stenosis is present.   5. Aortic dilatation noted. Aneurysm of the ascending aorta, measuring 50  mm.   6. The inferior vena cava is dilated in size with >50% respiratory  variability, suggesting right atrial pressure of 8 mmHg.      Neuro/Psych  PSYCHIATRIC DISORDERS Anxiety Depression     Neuromuscular disease    GI/Hepatic Neg liver ROS,,,Acute cholecystitis   Endo/Other   diabetes, Well Controlled, Type 2, Oral Hypoglycemic Agents  Hyperlipidemia GLP-1 RA therapy- last dose 5/23   Renal/GU Renal disease  negative genitourinary   Musculoskeletal  (+) Arthritis , Osteoarthritis,    Abdominal   Peds  Hematology Plavix  therapy- last dose 5/23   Anesthesia Other Findings   Reproductive/Obstetrics                             Anesthesia Physical Anesthesia Plan  ASA: 3  Anesthesia Plan: General   Post-op Pain Management: Minimal or no pain anticipated   Induction: Intravenous  PONV Risk Score and Plan: 3 and Treatment may vary due to age or medical condition, Ondansetron  and Dexamethasone   Airway Management Planned: Oral ETT  Additional Equipment: None  Intra-op Plan:   Post-operative Plan: Extubation in OR  Informed Consent: I have reviewed the patients History and Physical, chart, labs and discussed the procedure including the risks, benefits and alternatives for the proposed anesthesia with the patient or authorized representative who has indicated his/her understanding and acceptance.     Dental advisory given  Plan Discussed with: Anesthesiologist and CRNA  Anesthesia Plan Comments:         Anesthesia Quick Evaluation

## 2023-12-15 NOTE — TOC Benefit Eligibility Note (Signed)
 Pharmacy Patient Advocate Encounter  Insurance verification completed.    The patient is insured through [employer]. Patient has Medicare and is not eligible for a copay card, but may be able to apply for patient assistance or Medicare RX Payment Plan (Patient Must reach out to their plan, if eligible for payment plan), if available.    Ran test claim for Jardiance 10mg  and the current 30 day co-pay is $0.  Ran test claim for Farxiga 10mg  and the current 30 day co-pay is $0.   This test claim was processed through [employer]- copay amounts may vary at other pharmacies due to [employer], or as the patient moves through the different stages of their insurance plan.

## 2023-12-15 NOTE — Progress Notes (Signed)
 AM CBG 93.

## 2023-12-15 NOTE — Progress Notes (Signed)
 Pt returned to 6e25 from PACU. Received report from Lam[place], Charity fundraiser. See focused reassessment.

## 2023-12-16 ENCOUNTER — Encounter (HOSPITAL_COMMUNITY): Payer: Self-pay | Admitting: Surgery

## 2023-12-16 DIAGNOSIS — K819 Cholecystitis, unspecified: Secondary | ICD-10-CM

## 2023-12-16 DIAGNOSIS — R7989 Other specified abnormal findings of blood chemistry: Secondary | ICD-10-CM | POA: Diagnosis not present

## 2023-12-16 DIAGNOSIS — K810 Acute cholecystitis: Secondary | ICD-10-CM | POA: Diagnosis not present

## 2023-12-16 DIAGNOSIS — R079 Chest pain, unspecified: Secondary | ICD-10-CM | POA: Diagnosis not present

## 2023-12-16 DIAGNOSIS — R933 Abnormal findings on diagnostic imaging of other parts of digestive tract: Secondary | ICD-10-CM

## 2023-12-16 LAB — COMPREHENSIVE METABOLIC PANEL WITH GFR
ALT: 124 U/L — ABNORMAL HIGH (ref 0–44)
AST: 72 U/L — ABNORMAL HIGH (ref 15–41)
Albumin: 3.2 g/dL — ABNORMAL LOW (ref 3.5–5.0)
Alkaline Phosphatase: 225 U/L — ABNORMAL HIGH (ref 38–126)
Anion gap: 10 (ref 5–15)
BUN: 10 mg/dL (ref 6–20)
CO2: 23 mmol/L (ref 22–32)
Calcium: 9.1 mg/dL (ref 8.9–10.3)
Chloride: 103 mmol/L (ref 98–111)
Creatinine, Ser: 0.78 mg/dL (ref 0.61–1.24)
GFR, Estimated: >60 mL/min (ref >60)
Glucose, Bld: 118 mg/dL — ABNORMAL HIGH (ref 70–99)
Potassium: 4.4 mmol/L (ref 3.5–5.1)
Sodium: 136 mmol/L (ref 135–145)
Total Bilirubin: 1.9 mg/dL — ABNORMAL HIGH (ref 0.0–1.2)
Total Protein: 7.2 g/dL (ref 6.5–8.1)

## 2023-12-16 LAB — CBC
HCT: 35.1 % — ABNORMAL LOW (ref 39.0–52.0)
Hemoglobin: 12 g/dL — ABNORMAL LOW (ref 13.0–17.0)
MCH: 31.3 pg (ref 26.0–34.0)
MCHC: 34.2 g/dL (ref 30.0–36.0)
MCV: 91.4 fL (ref 80.0–100.0)
Platelets: 271 10*3/uL (ref 150–400)
RBC: 3.84 MIL/uL — ABNORMAL LOW (ref 4.22–5.81)
RDW: 15.1 % (ref 11.5–15.5)
WBC: 5.6 10*3/uL (ref 4.0–10.5)
nRBC: 0 % (ref 0.0–0.2)

## 2023-12-16 LAB — GLUCOSE, CAPILLARY: Glucose-Capillary: 113 mg/dL — ABNORMAL HIGH (ref 70–99)

## 2023-12-16 NOTE — Plan of Care (Signed)

## 2023-12-16 NOTE — Progress Notes (Signed)
   Patient Name: [name] Date of Encounter: 12/16/2023 [facility] Cardiologist: Antoinette Batman, MD   Interval Summary  .    No chest pain.   Vital Signs .    Vitals:   12/15/23 2013 12/16/23 0044 12/16/23 0500 12/16/23 0815  BP: (!) 155/100 117/85 (!) 121/92 (!) 143/90  Pulse: 83  84 86  Resp: 17 18 18 18   Temp: 98.1 F (36.7 C) 98.1 F (36.7 C) 97.7 F (36.5 C) 97.8 F (36.6 C)  TempSrc: Oral Oral Oral Oral  SpO2: 100%  100% 100%  Weight:      Height:        Intake/Output Summary (Last 24 hours) at 12/16/2023 1018 Last data filed at 12/15/2023 2000 Gross per 24 hour  Intake 1527.17 ml  Output --  Net 1527.17 ml      12/15/2023   11:25 AM 12/11/2023   11:30 PM 12/11/2023   10:00 PM  Last 3 Weights  Weight (lbs) 160 lb 3.2 oz 160 lb 3.2 oz 182 lb 15.7 oz  Weight (kg) 72.666 kg 72.666 kg 83 kg      Telemetry/ECG    Sinus - Personally Reviewed  Physical Exam .    GEN: NAD   Neck: No JVD Cardiac: RRR with no murmurs Respiratory: Clear bilaterally [id number] MS: No LE edema  Assessment & Plan .     48 year old male with past medical history of CAD status post 4v CABG '09 who presented with chest pain but his pain is now not felt to be cardiac related. He was found to have acute cholecystitis and is now post lap chole on 12/15/23.    CAD s/p CABG: No recurrent chest pain. No evidence of ACS HFmrEF: LVEF40-45%. Continue Losartan . Continue ASA.    Acute cholecystitis: Per surgery and GI. Possible ERCP on 12/17/23.   No active cardiac issues. We are awaiting recs from GI and surgery teams regarding further testing for his non-cardiac issues. .  For questions or updates, please contact [facility] Please consult [email or web address] for contact info under     Antoinette Batman, MD, FACC 12/16/2023 10:18 AM

## 2023-12-16 NOTE — Consult Note (Addendum)
 Consultation Note   Referring Provider:  General Surgery PCP: Forest Idol, NP Primary Gastroenterologist: Para Bold  Reason for Consultation:  Elevated LFTs, abnormal IOC DOA: 12/11/2023         Hospital Day: 6   ASSESSMENT    48 yo male admitted with elevated LFTs / acute cholecystitis,  s/p laparoscopic cholecystectomy with IOC yesterday.  Based on operative findings Surgery concerned about debris in CBD. IOC raises concern for a narrowed segment of the distal CBD just proximal to the ampulla.   History of polysubstance abuse  Diabetes  Hypertension   See PMH for additional history  PLAN:   Will most likely need ERCP, will discuss further with biliary endoscopist. If ERCP is warranted it can not be done until tomorrow afternoon. However, patient does say that he plans on leaving hospital tomorrow by noon.   HPI   48 y.o. year old male with a medical history including but not limited to CAD s/p CABG on plavix , polysubstance abuse, HTN, DM.   [name] was admitted 5/24 with chest pain. Cardiology evaluated. Troponins were negative. LFTs were elevated. CT scan concerning for cholecystitis and intrahepatic / extrahepatic biliary duct dilation ( 12 mm).  MRCP suggested cholelithiasis but no evidence for choledocholithiasis. On 5/27 he underwent a lap cholecystectomy for acute cholecystitis. Intraoperative findings: Gallbladder minimally inflamed. IOC suggested debris within the CBD.  LFTs have remained elevated this am.  He complains of non-radiating RUQ pain. Pain no longer stabbing like it was when admitted, now more of a constant throbbing and he has ongoing nausea but wants to eat.  No lower GI complaints  IOC There is a narrowed segment of the distal CBD just proximal to the ampulla, with a small amount of contrast extending through this narrowed segment and into the small bowel. I do not see an obvious filling defect but the  narrowed segment appears irregular. This could be due to stricture, inflammation, or conceivably neoplasm (although recent MRI abdomen did not reveal a definite mass in this vicinity).  Labs and Imaging:  Recent Labs    12/14/23 0422 12/15/23 0334 12/16/23 0440  PROT 5.6* 5.8* 7.2  ALBUMIN 2.4* 2.5* 3.2*  AST 101* 51* 72*  ALT 159* 117* 124*  ALKPHOS 147* 151* 225*  BILITOT 3.7* 1.9* 1.9*   Recent Labs    12/14/23 0422 12/15/23 0334 12/16/23 0440  WBC 8.1 5.5 5.6  HGB 11.3* 11.5* 12.0*  HCT 32.5* 33.9* 35.1*  MCV 91.0 92.1 91.4  PLT 220 195 271   Recent Labs    12/14/23 0422 12/15/23 0334 12/16/23 0440  NA 136 136 136  K 3.4* 3.9 4.4  CL 106 106 103  CO2 21* 22 23  GLUCOSE 91 97 118*  BUN 15 16 10   CREATININE 0.74 0.74 0.78  CALCIUM  8.7* 9.1 9.1     Past Medical History:  Diagnosis Date   Anxiety    Arthritis    CHF (congestive heart failure) (HCC)    Complication of anesthesia    difficulty awakening following appendectomy    Diabetes mellitus without complication (HCC)    Dizziness    with humidity    H/O psychiatric hospitalization 07/03/2023   History of laparoscopic appendectomy 09/18/2013  Hx of CABG    Hyperlipidemia    Hypertension    Kidney stones    Major depressive disorder 07/03/2023   MYOCARDIAL INFARCTION 10/13/2008   Qualifier: Diagnosis of  By: Grandville Lax, MD, Elridge Haller    Numbness in right leg    pt relates to sciatica   Right kidney stone 08/06/2014   Noted incidentally on CT January 2016; ~0.6 cm, non-obstructing    Severe alcohol use disorder (HCC) 07/03/2023   Substance induced mood disorder (HCC) 07/03/2023   Tobacco user     Past Surgical History:  Procedure Laterality Date   APPENDECTOMY     CARDIAC CATHETERIZATION     CORONARY ARTERY BYPASS GRAFT     status post diaphragmatic wall infarction Rx BMS RCA 03-11-08    CORONARY STENT PLACEMENT     CYSTOSCOPY WITH RETROGRADE PYELOGRAM, URETEROSCOPY AND STENT  PLACEMENT Bilateral 03/16/2015   Procedure: CYSTOSCOPY WITH BILATERAL RETROGRADE PYELOGRAM, RIGHT URETEROSCOPY, STONE BASKETRY WITH LASER LITHOTRIPSY ON THE RIGHT,  AND BILATERAL STENT PLACEMENT;  Surgeon: Marco Severs, MD;  Location: [facility];  Service: Urology;  Laterality: Bilateral;   CYSTOSCOPY WITH RETROGRADE PYELOGRAM, URETEROSCOPY AND STENT PLACEMENT Left 04/02/2015   Procedure: CYSTOSCOPY WITH RETROGRADE PYELOGRAM, URETEROSCOPY REMOVAL BILATERAL STENTS;  Surgeon: Marco Severs, MD;  Location: [facility];  Service: Urology;  Laterality: Left;   HOLMIUM LASER APPLICATION Right 03/16/2015   Procedure: HOLMIUM LASER APPLICATION;  Surgeon: Marco Severs, MD;  Location: [facility];  Service: Urology;  Laterality: Right;   LAPAROSCOPIC APPENDECTOMY N/A 09/17/2013   Procedure: APPENDECTOMY LAPAROSCOPIC;  Surgeon: Rogena Class, MD;  Location: [facility];  Service: General;  Laterality: N/A;   LEFT HEART CATHETERIZATION WITH CORONARY/GRAFT ANGIOGRAM N/A 10/19/2011   Procedure: LEFT HEART CATHETERIZATION WITH Estella Helling;  Surgeon: Kristopher Pheasant, MD;  Location: [facility] CATH [facility];  Service: Cardiovascular;  Laterality: N/A;   ORTHOPEDIC SURGERY     TONSILLECTOMY      Family History  Adopted: Yes  Problem Relation Age of Onset   Cancer Mother    Diabetes Mother    Hyperlipidemia Mother    Hypertension Mother    Heart failure Mother        Died age 47   Cancer Sister    Diabetes Sister    Hyperlipidemia Sister    Hypertension Sister     Prior to Admission medications   Medication Sig Start Date End Date Taking? Authorizing Provider  amoxicillin  (AMOXIL ) 500 MG tablet Take 500 mg by mouth every 8 (eight) hours. 12/03/23  Yes [provider]  atorvastatin  (LIPITOR ) 80 MG tablet Take 1 tablet (80 mg total) by mouth daily. 07/04/23 12/10/24 Yes Timmothy Foots, MD  clopidogrel  (PLAVIX ) 75 MG tablet Take 75 mg by mouth daily. 10/27/23  Yes [provider]  DULoxetine   (CYMBALTA ) 30 MG capsule Take 3 capsules (90 mg total) by mouth daily. 07/04/23  Yes Timmothy Foots, MD  fenofibrate  (TRICOR ) 48 MG tablet Take 1 tablet (48 mg total) by mouth daily. 07/03/23  Yes Timmothy Foots, MD  isosorbide  mononitrate (IMDUR ) 60 MG 24 hr tablet Take 1 tablet (60 mg total) by mouth daily. 07/04/23  Yes Timmothy Foots, MD  losartan  (COZAAR ) 100 MG tablet Take 1 tablet (100 mg total) by mouth daily. 07/03/23 12/10/24 Yes Timmothy Foots, MD  metFORMIN (GLUCOPHAGE) 1000 MG tablet Take 500 mg by mouth 2 (two) times daily with a meal. 05/15/23  Yes [provider]  metoprolol  tartrate (LOPRESSOR ) 25 MG tablet Take 1 tablet (25 mg total) by mouth 2 (two) times daily. 07/03/23  Yes Timmothy Foots, MD  MOUNJARO 2.5 MG/0.5ML Pen Inject 2.5 mg into the skin every Friday. 06/19/23  Yes [provider]  Multiple Vitamin (MULTI-VITAMIN DAILY PO) Take 1 Dose by mouth daily. [employer] vitamin pack takes 1 packet by mouth daily   Yes [provider]  naltrexone  (DEPADE) 50 MG tablet Take 1 tablet (50 mg total) by mouth daily. 07/04/23  Yes Timmothy Foots, MD  nicotine  (NICODERM CQ  - DOSED IN MG/24 HOURS) 21 mg/24hr patch Place 1 patch (21 mg total) onto the skin daily. 07/04/23  Yes Timmothy Foots, MD  nitroGLYCERIN  (NITROSTAT ) 0.4 MG SL tablet Place 1 tablet (0.4 mg total) under the tongue every 5 (five) minutes as needed for chest pain. 07/20/19  Yes Newlin, Enobong, MD  ranolazine  (RANEXA ) 500 MG 12 hr tablet Take 1 tablet (500 mg total) by mouth 2 (two) times daily. 07/03/23 12/10/24 Yes Timmothy Foots, MD  traZODone  (DESYREL ) 50 MG tablet Take 1 tablet (50 mg total) by mouth at bedtime for 14 days. 07/03/23 12/10/24 Yes Timmothy Foots, MD    Current Facility-Administered Medications  Medication Dose Route Frequency Provider Last Rate Last Admin   aspirin  EC tablet 81 mg  81 mg Oral Daily Johnson, Kelly R, PA-C   81 mg at 12/14/23 0840   DULoxetine  (CYMBALTA ) DR  capsule 90 mg  90 mg Oral Daily Johnson, Kelly R, PA-C   90 mg at 12/14/23 0840   heparin  injection 5,000 Units  5,000 Units Subcutaneous Q8H Johnson, Kelly R, PA-C   5,000 Units at 12/16/23 0542   losartan  (COZAAR ) tablet 25 mg  25 mg Oral Daily Annetta Killian, PA-C   25 mg at 12/14/23 1610   morphine  (MSIR) tablet 15 mg  15 mg Oral Q4H PRN Johnson, Kelly R, PA-C   15 mg at 12/16/23 0541   nicotine  (NICODERM CQ  - dosed in mg/24 hours) patch 21 mg  21 mg Transdermal Daily Johnson, Kelly R, PA-C   21 mg at 12/15/23 1638   ondansetron  (ZOFRAN ) injection 4 mg  4 mg Intravenous Q6H PRN Johnson, Kelly R, PA-C   4 mg at 12/16/23 0541   ranolazine  (RANEXA ) 12 hr tablet 500 mg  500 mg Oral BID Johnson, Kelly R, PA-C   500 mg at 12/15/23 2110   traZODone  (DESYREL ) tablet 50 mg  50 mg Oral QHS Johnson, Kelly R, PA-C   50 mg at 12/15/23 2109   Facility-Administered Medications Ordered in Other Encounters  Medication Dose Route Frequency Provider Last Rate Last Admin   dexamethasone  (DECADRON ) injection   Intravenous Anesthesia Intra-op Saenz, Violeta, CRNA   10 mg at 12/15/23 1400   fentaNYL  citrate (PF) (SUBLIMAZE ) injection   Intravenous Anesthesia Intra-op Saenz, Violeta, CRNA   50 mcg at 12/15/23 1540   HYDROmorphone  (DILAUDID ) injection   Intravenous Anesthesia Intra-op Saenz, Violeta, CRNA   0.5 mg at 12/15/23 1440   lactated ringers  infusion   Intravenous Continuous PRN Saenz, Violeta, CRNA   New Bag at 12/15/23 1512   lidocaine  2% (20 mg/mL) 5 mL syringe   Intravenous Anesthesia Intra-op Saenz, Violeta, CRNA   80 mg at 12/15/23 1354   ondansetron  (ZOFRAN ) injection   Intravenous Anesthesia Intra-op Saenz, Violeta, CRNA   4 mg at 12/15/23 1400   phenylephrine  (NEO-SYNEPHRINE) injection   Intravenous Anesthesia Intra-op Saenz, Violeta, CRNA   160  mcg at 12/15/23 1406   propofol  (DIPRIVAN ) 10 mg/mL bolus/IV push   Intravenous Anesthesia Intra-op Saenz, Violeta, CRNA   150 mg at 12/15/23 1354    rocuronium  (ZEMURON ) injection   Intravenous Anesthesia Intra-op Saenz, Violeta, CRNA   10 mg at 12/15/23 1445   sugammadex  sodium (BRIDION ) injection   Intravenous Anesthesia Intra-op Dyanna Glasgow, CRNA   200 mg at 12/15/23 1512    Allergies as of 12/11/2023 - Review Complete 12/11/2023  Allergen Reaction Noted   Celexa  [citalopram ] Itching and Rash 06/15/2012   Codeine Itching 12/04/2008    Social History   Socioeconomic History   Marital status: Divorced    Spouse name: Not on file   Number of children: 4   Years of education: Not on file   Highest education level: Not on file  Occupational History   Not on file  Tobacco Use   Smoking status: Every Day    Current packs/day: 0.50    Average packs/day: 0.5 packs/day for 35.0 years (17.5 ttl pk-yrs)    Types: Cigarettes   Smokeless tobacco: Former    Types: Snuff    Quit date: 07/20/1994   Tobacco comments:    5-6 cigs/day  Vaping Use   Vaping status: Never Used  Substance and Sexual Activity   Alcohol use: Yes   Drug use: Yes    Types: Marijuana   Sexual activity: Not Currently  Other Topics Concern   Not on file  Social History Narrative   Lives with girlfriend and two children.  He has four children.     Social Drivers of Corporate investment banker Strain: Not on file  Food Insecurity: Food Insecurity Present (12/12/2023)   Hunger Vital Sign    Worried About Running Out of Food in the Last Year: Often true    Ran Out of Food in the Last Year: Often true  Transportation Needs: No Transportation Needs (12/12/2023)   PRAPARE - Administrator, Civil Service (Medical): No    Lack of Transportation (Non-Medical): No  Physical Activity: Not on file  Stress: Not on file  Social Connections: Not on file  Intimate Partner Violence: Not At Risk (12/12/2023)   Humiliation, Afraid, Rape, and Kick questionnaire    Fear of Current or Ex-Partner: No    Emotionally Abused: No    Physically Abused: No     Sexually Abused: No   Code Status   Code Status: Limited: Do not attempt resuscitation (DNR) -DNR-LIMITED -Do Not Intubate/DNI   Review of Systems: All systems reviewed and negative except where noted in HPI.  Physical Exam: Vital signs in last 24 hours: Temp:  [97.5 F (36.4 C)-98.1 F (36.7 C)] 97.8 F (36.6 C) (05/29 0815) Pulse Rate:  [73-90] 86 (05/29 0815) Resp:  [12-18] 18 (05/29 0815) BP: (90-155)/(67-100) 143/90 (05/29 0815) SpO2:  [95 %-100 %] 100 % (05/29 0815) Weight:  [72.7 kg] 72.7 kg (05/28 1125) Last BM Date : 12/12/23  General:  Pleasant male in NAD Psych:  Cooperative. Normal mood and affect Eyes: Pupils equal Ears:  Normal auditory acuity Nose: No deformity, discharge or lesions Neck:  Supple, no masses felt Lungs:  Clear to auscultation.  Heart:  Regular rate, regular rhythm.  Abdomen:  Soft, nondistended, RUQ tenderness to even light palpation. A few bowel sounds, no masses felt Rectal :  Deferred Msk: Symmetrical without gross deformities.  Neurologic:  Alert, oriented, grossly normal neurologically Extremities : No edema Skin:  Intact without significant  lesions. Multiple tattoos    Intake/Output from previous day: 05/28 0701 - 05/29 0700 In: 1727.2 [P.O.:480; I.V.:1047.2; IV Piggyback:200] Out: -  Intake/Output this shift:  No intake/output data recorded.   Mai Schwalbe, NP-C   12/16/2023, 9:00 AM  I have taken an interval history, thoroughly reviewed the chart and examined the patient. I agree with the Advanced Practitioner's note, impression and recommendations, and have recorded additional findings, impressions and recommendations below. I performed a substantive portion of this encounter (>50% time spent), including a complete performance of the medical decision making.  My additional thoughts are as follows:  Abnormal IOC, elevated LFTs. Case also reviewed with our biliary endoscopist Dr. Venice Gillis and multiple chat messages exchanged  between us  and the surgical service. There was concern for choledocholithiasis based on the overall clinical picture, and we were recommending and planning an ERCP with Dr. Venice Gillis tomorrow morning.  (I also personally reviewed the [facility] images, and Dr. Venice Gillis personally reviewed the preop MRCP images.)  This was all explained to the patient, then he ultimately decided to leave against advice.  He was made aware of our clinical concerns and risks that leaving against advice could lead to complications and illness, particularly if there are stones retained in the bile duct.  Kerby Pearson III Office:[phone]

## 2023-12-16 NOTE — Progress Notes (Signed)
 Pt decided to leave AMA. Antoinette Batman, MD paged.

## 2023-12-16 NOTE — Plan of Care (Signed)
  Problem: Pain Managment: Goal: General experience of comfort will improve and/or be controlled Outcome: Progressing   Problem: Safety: Goal: Ability to remain free from injury will improve Outcome: Progressing   Problem: Skin Integrity: Goal: Risk for impaired skin integrity will decrease Outcome: Progressing

## 2023-12-16 NOTE — Progress Notes (Signed)
 Progress Note  1 Day Post-Op  Subjective: Having some post op pain as expected.  States he is leaving today as he has just been here too long.  We discussed his IOC findings and the recommendation for ERCP to further evaluate this.  He is hungry and would like to eat.  Objective: Vital signs in last 24 hours: Temp:  [97.5 F (36.4 C)-98.1 F (36.7 C)] 97.8 F (36.6 C) (05/29 0815) Pulse Rate:  [73-90] 86 (05/29 0815) Resp:  [12-18] 18 (05/29 0815) BP: (90-155)/(67-100) 143/90 (05/29 0815) SpO2:  [95 %-100 %] 100 % (05/29 0815) Weight:  [72.7 kg] 72.7 kg (05/28 1125) Last BM Date : 12/12/23  Intake/Output from previous day: 05/28 0701 - 05/29 0700 In: 1727.2 [P.O.:480; I.V.:1047.2; IV Piggyback:200] Out: -  Intake/Output this shift: No intake/output data recorded.  PE: Abd: soft, appropriately tender, incisions c/d/i  Lab Results:  Recent Labs    12/15/23 0334 12/16/23 0440  WBC 5.5 5.6  HGB 11.5* 12.0*  HCT 33.9* 35.1*  PLT 195 271   BMET Recent Labs    12/15/23 0334 12/16/23 0440  NA 136 136  K 3.9 4.4  CL 106 103  CO2 22 23  GLUCOSE 97 118*  BUN 16 10  CREATININE 0.74 0.78  CALCIUM  9.1 9.1   PT/INR Recent Labs    12/14/23 0830  LABPROT 13.7  INR 1.0   CMP     Component Value Date/Time   NA 136 12/16/2023 0440   NA 139 02/18/2018 1026   K 4.4 12/16/2023 0440   CL 103 12/16/2023 0440   CO2 23 12/16/2023 0440   GLUCOSE 118 (H) 12/16/2023 0440   BUN 10 12/16/2023 0440   BUN 11 02/18/2018 1026   CREATININE 0.78 12/16/2023 0440   CREATININE 1.05 07/25/2018 1446   CALCIUM  9.1 12/16/2023 0440   PROT 7.2 12/16/2023 0440   PROT 7.4 02/18/2018 1026   ALBUMIN 3.2 (L) 12/16/2023 0440   ALBUMIN 4.8 02/18/2018 1026   AST 72 (H) 12/16/2023 0440   ALT 124 (H) 12/16/2023 0440   ALKPHOS 225 (H) 12/16/2023 0440   BILITOT 1.9 (H) 12/16/2023 0440   BILITOT 0.4 02/18/2018 1026   GFRNONAA >60 12/16/2023 0440   GFRNONAA 87 07/25/2018 1446   GFRAA >60  08/07/2019 1706   GFRAA 101 07/25/2018 1446   Lipase     Component Value Date/Time   LIPASE 44 12/13/2023 0435       Studies/Results: DG Cholangiogram Operative Result Date: 12/15/2023 CLINICAL DATA:  Intraoperative cholangiogram, laparoscopic cholecystectomy EXAM: INTRAOPERATIVE CHOLANGIOGRAM TECHNIQUE: Cholangiographic images from the C-arm fluoroscopic device were submitted for interpretation post-operatively. Please see the procedural report for the amount of contrast and the fluoroscopy time utilized. FLUOROSCOPY: Radiation Exposure Index (as provided by the fluoroscopic device): 2.6 mGy Kerma COMPARISON:  Abdominal ultrasound 12/13/2023 FINDINGS: A total of 95 images are provided in 2 series. The first series demonstrates injection of a cannulated cystic duct remnant with filling the common hepatic duct, intrahepatic bile ducts, and common bile duct. The common bile duct appears mildly dilated extending to a 1.5 cm in length narrowed segment just proximal to and along the ampulla as shown on image 86 of series 1 -1, with a small amount of contrast extending through this narrowed segment and into the small bowel. The subsequent series of 8 images again depicts this narrowed in somewhat irregular segment of the distal CBD. I do not see an obvious filling defect but the narrowed segment appears  irregular. IMPRESSION: 1. There is a narrowed segment of the distal CBD just proximal to the ampulla, with a small amount of contrast extending through this narrowed segment and into the small bowel. I do not see an obvious filling defect but the narrowed segment appears irregular. This could be due to stricture, inflammation, or conceivably neoplasm (although recent MRI abdomen did not reveal a definite mass in this vicinity). Electronically Signed   By: Freida Jes M.D.   On: 12/15/2023 19:04   ECHOCARDIOGRAM COMPLETE Result Date: 12/14/2023    ECHOCARDIOGRAM REPORT   Patient Name:   BLASE BECKNER Date of Exam: 12/14/2023 Medical Rec #:  [id number]        Height:       70.0 in Accession #:    [id number]       Weight:       160.2 lb Date of Birth:  1975/08/11        BSA:          1.899 m Patient Age:    48 years         BP:           97/73 mmHg Patient Gender: M                HR:           68 bpm. Exam Location:  Inpatient Procedure: 2D Echo, Cardiac Doppler, Color Doppler and Intracardiac            Opacification Agent (Both Spectral and Color Flow Doppler were            utilized during procedure). Indications:    Chest Pain  History:        Patient has no prior history of Echocardiogram examinations.                 CHF; Risk Factors:Current Smoker and Hypertension.  Sonographer:    Willey Harrier Referring Phys: [id number] ANGELA NICOLE DUKE IMPRESSIONS  1. Left ventricular ejection fraction, by estimation, is 40 to 45%. The left ventricle has mildly decreased function. The left ventricle demonstrates global hypokinesis. Left ventricular diastolic parameters are indeterminate.  2. Right ventricular systolic function is normal. The right ventricular size is mildly enlarged. There is normal pulmonary artery systolic pressure. The estimated right ventricular systolic pressure is 20.1 mmHg.  3. The mitral valve is grossly normal. Trivial mitral valve regurgitation. No evidence of mitral stenosis.  4. The aortic valve is tricuspid. Aortic valve regurgitation is not visualized. No aortic stenosis is present.  5. Aortic dilatation noted. Aneurysm of the ascending aorta, measuring 50 mm.  6. The inferior vena cava is dilated in size with >50% respiratory variability, suggesting right atrial pressure of 8 mmHg. FINDINGS  Left Ventricle: Left ventricular ejection fraction, by estimation, is 40 to 45%. The left ventricle has mildly decreased function. The left ventricle demonstrates global hypokinesis. The left ventricular internal cavity size was normal in size. There is  no left ventricular hypertrophy. Abnormal  (paradoxical) septal motion consistent with post-operative status. Left ventricular diastolic parameters are indeterminate. Right Ventricle: The right ventricular size is mildly enlarged. No increase in right ventricular wall thickness. Right ventricular systolic function is normal. There is normal pulmonary artery systolic pressure. The tricuspid regurgitant velocity is 1.74  m/s, and with an assumed right atrial pressure of 8 mmHg, the estimated right ventricular systolic pressure is 20.1 mmHg. Left Atrium: Left atrial size was normal in size. Right Atrium: Right atrial  size was normal in size. Pericardium: There is no evidence of pericardial effusion. Mitral Valve: The mitral valve is grossly normal. Trivial mitral valve regurgitation. No evidence of mitral valve stenosis. MV peak gradient, 2.3 mmHg. The mean mitral valve gradient is 1.0 mmHg. Tricuspid Valve: The tricuspid valve is grossly normal. Tricuspid valve regurgitation is trivial. No evidence of tricuspid stenosis. Aortic Valve: The aortic valve is tricuspid. Aortic valve regurgitation is not visualized. No aortic stenosis is present. Aortic valve peak gradient measures 4.7 mmHg. Pulmonic Valve: The pulmonic valve was grossly normal. Pulmonic valve regurgitation is trivial. No evidence of pulmonic stenosis. Aorta: Aortic dilatation noted and the aortic root is normal in size and structure. There is an aneurysm involving the ascending aorta measuring 50 mm. Venous: The right lower pulmonary vein is normal. The inferior vena cava is dilated in size with greater than 50% respiratory variability, suggesting right atrial pressure of 8 mmHg. IAS/Shunts: The atrial septum is grossly normal.  LEFT VENTRICLE PLAX 2D LVIDd:         5.50 cm   Diastology LVIDs:         4.70 cm   LV e' medial:    7.94 cm/s LV PW:         0.90 cm   LV E/e' medial:  9.8 LV IVS:        1.10 cm   LV e' lateral:   12.10 cm/s LVOT diam:     2.20 cm   LV E/e' lateral: 6.4 LV SV:         63  LV SV Index:   33 LVOT Area:     3.80 cm  RIGHT VENTRICLE            IVC RV Basal diam:  4.20 cm    IVC diam: 2.10 cm RV S prime:     8.05 cm/s TAPSE (M-mode): 2.1 cm LEFT ATRIUM             Index        RIGHT ATRIUM           Index LA Vol (A2C):   60.3 ml 31.75 ml/m  RA Area:     15.10 cm LA Vol (A4C):   54.4 ml 28.64 ml/m  RA Volume:   40.30 ml  21.22 ml/m LA Biplane Vol: 57.2 ml 30.11 ml/m  AORTIC VALVE                 PULMONIC VALVE AV Area (Vmax): 2.96 cm     PR End Diast Vel: 5.02 msec AV Vmax:        108.00 cm/s AV Peak Grad:   4.7 mmHg LVOT Vmax:      84.20 cm/s LVOT Vmean:     62.200 cm/s LVOT VTI:       0.165 m  AORTA Ao Root diam: 4.00 cm Ao Asc diam:  5.00 cm MITRAL VALVE               TRICUSPID VALVE MV Area (PHT): 3.53 cm    TR Peak grad:   12.1 mmHg MV Area VTI:   2.18 cm    TR Vmax:        174.00 cm/s MV Peak grad:  2.3 mmHg MV Mean grad:  1.0 mmHg    SHUNTS MV Vmax:       0.75 m/s    Systemic VTI:  0.16 m MV Vmean:      54.8 cm/s   Systemic Diam:  2.20 cm MV Decel Time: 215 msec MV E velocity: 77.50 cm/s MV A velocity: 86.90 cm/s MV E/A ratio:  0.89 Jackquelyn Mass MD Electronically signed by Jackquelyn Mass MD Signature Date/Time: 12/14/2023/3:38:26 PM    Final     Anti-infectives: Anti-infectives (From admission, onward)    Start     Dose/Rate Route Frequency Ordered Stop   12/14/23 0915  metroNIDAZOLE (FLAGYL) IVPB 500 mg  Status:  Discontinued        500 mg 100 mL/hr over 60 Minutes Intravenous Every 12 hours 12/14/23 0825 12/15/23 1625   12/14/23 0900  cefTRIAXone  (ROCEPHIN ) 2 g in sodium chloride  0.9 % 100 mL IVPB  Status:  Discontinued        2 g 200 mL/hr over 30 Minutes Intravenous Every 24 hours 12/14/23 0802 12/15/23 1625   12/13/23 1525  cefTRIAXone  (ROCEPHIN ) 1 g in sodium chloride  0.9 % 100 mL IVPB  Status:  Discontinued        1 g 200 mL/hr over 30 Minutes Intravenous Every 12 hours 12/13/23 1525 12/14/23 0802        Assessment/Plan POD 1, s/p lap chole with  IOC by Dr. Camilo Cella 5/28 for Acute cholecystitis  - lap chole went well.  Patient with appropriate tenderness -IOC concerning for CBD narrowing.  LFTs downtrending but still elevated.  GI evaluating for ERCP possibly tomorrow if patient agrees to stay. -surgically doing well and will allow HH diet today and NPO p MN  FEN: HH diet, NPO after MN, IVF per primary  VTE: SQH, continue to hold Plavix   ID: rocephin /flagyl 5/26>> completed  - per primary attending -  CAD s/p CABG and stenting on plavix  (LD 5/24) CHF HTN HLD Hx of alcohol abuse  LOS: 5 days    Leone Ralphs, [facility] 12/16/2023, 10:27 AM Please see Amion for pager number during day hours 7:00am-4:30pm

## 2023-12-16 NOTE — Anesthesia Postprocedure Evaluation (Signed)
 Anesthesia Post Note  Patient: [name]  Procedure(s) Performed: LAPAROSCOPIC CHOLECYSTECTOMY WITH INTRAOPERATIVE CHOLANGIOGRAM (Abdomen)     Patient location during evaluation: PACU Anesthesia Type: General Level of consciousness: awake and alert Pain management: pain level controlled Vital Signs Assessment: post-procedure vital signs reviewed and stable Respiratory status: spontaneous breathing, nonlabored ventilation and respiratory function stable Cardiovascular status: blood pressure returned to baseline and stable Postop Assessment: no apparent nausea or vomiting Anesthetic complications: no   No notable events documented.  Last Vitals:                  Brenetta Penny A.

## 2023-12-17 LAB — GLUCOSE, CAPILLARY
Glucose-Capillary: 113 mg/dL — ABNORMAL HIGH (ref 70–99)
Glucose-Capillary: 93 mg/dL (ref 70–99)
Glucose-Capillary: 184 mg/dL — ABNORMAL HIGH (ref 70–99)

## 2023-12-17 LAB — SURGICAL PATHOLOGY

## 2023-12-19 HISTORY — PX: GALLBLADDER SURGERY: SHX652

## 2024-01-27 ENCOUNTER — Telehealth: Payer: Self-pay

## 2024-01-27 ENCOUNTER — Ambulatory Visit: Admitting: Physician Assistant

## 2024-01-27 ENCOUNTER — Other Ambulatory Visit

## 2024-01-27 ENCOUNTER — Ambulatory Visit: Payer: Self-pay | Admitting: Physician Assistant

## 2024-01-27 ENCOUNTER — Encounter: Payer: Self-pay | Admitting: Physician Assistant

## 2024-01-27 VITALS — BP 118/68 | HR 71 | Ht 70.0 in | Wt 179.0 lb

## 2024-01-27 DIAGNOSIS — Z7901 Long term (current) use of anticoagulants: Secondary | ICD-10-CM

## 2024-01-27 DIAGNOSIS — F1911 Other psychoactive substance abuse, in remission: Secondary | ICD-10-CM

## 2024-01-27 DIAGNOSIS — K839 Disease of biliary tract, unspecified: Secondary | ICD-10-CM | POA: Diagnosis not present

## 2024-01-27 DIAGNOSIS — R7989 Other specified abnormal findings of blood chemistry: Secondary | ICD-10-CM | POA: Diagnosis not present

## 2024-01-27 DIAGNOSIS — R933 Abnormal findings on diagnostic imaging of other parts of digestive tract: Secondary | ICD-10-CM

## 2024-01-27 LAB — COMPREHENSIVE METABOLIC PANEL WITH GFR
ALT: 16 U/L (ref 0–53)
AST: 20 U/L (ref 0–37)
Albumin: 3.9 g/dL (ref 3.5–5.2)
Alkaline Phosphatase: 86 U/L (ref 39–117)
BUN: 17 mg/dL (ref 6–23)
CO2: 28 meq/L (ref 19–32)
Calcium: 9.6 mg/dL (ref 8.4–10.5)
Chloride: 103 meq/L (ref 96–112)
Creatinine, Ser: 0.91 mg/dL (ref 0.40–1.50)
GFR: 100.2 mL/min (ref >60.00)
Glucose, Bld: 86 mg/dL (ref 70–99)
Potassium: 4.3 meq/L (ref 3.5–5.1)
Sodium: 138 meq/L (ref 135–145)
Total Bilirubin: 0.5 mg/dL (ref 0.2–1.2)
Total Protein: 6.5 g/dL (ref 6.0–8.3)

## 2024-01-27 LAB — CBC WITH DIFFERENTIAL/PLATELET
Basophils Absolute: 0 K/uL (ref 0.0–0.1)
Basophils Relative: 0.6 % (ref 0.0–3.0)
Eosinophils Absolute: 0.2 K/uL (ref 0.0–0.7)
Eosinophils Relative: 2.5 % (ref 0.0–5.0)
HCT: 35.2 % — ABNORMAL LOW (ref 39.0–52.0)
Hemoglobin: 11.9 g/dL — ABNORMAL LOW (ref 13.0–17.0)
Lymphocytes Relative: 19.4 % (ref 12.0–46.0)
Lymphs Abs: 1.6 K/uL (ref 0.7–4.0)
MCHC: 33.8 g/dL (ref 30.0–36.0)
MCV: 94.1 fl (ref 78.0–100.0)
Monocytes Absolute: 0.6 K/uL (ref 0.1–1.0)
Monocytes Relative: 7.1 % (ref 3.0–12.0)
Neutro Abs: 6 K/uL (ref 1.4–7.7)
Neutrophils Relative %: 70.4 % (ref 43.0–77.0)
Platelets: 313 K/uL (ref 150.0–400.0)
RBC: 3.74 Mil/uL — ABNORMAL LOW (ref 4.22–5.81)
RDW: 15.1 % (ref 11.5–15.5)
WBC: 8.5 K/uL (ref 4.0–10.5)

## 2024-01-27 LAB — PROTIME-INR
INR: 0.9 ratio (ref 0.8–1.0)
Prothrombin Time: 9.6 s (ref 9.6–13.1)

## 2024-01-27 NOTE — Telephone Encounter (Signed)
 [facility] Pre-operative Risk Assessment     Request for surgical clearance:     Endoscopy Procedure  What type of surgery is being performed?     ERCP  When is this surgery scheduled?     TBD  What type of clearance is required ?   Pharmacy  Are there any medications that need to be held prior to surgery and how long? Plavix , 5 days  Practice name and name of physician performing surgery?      [facility]  What is your office phone and fax number?      Phone- [phone]  Fax- [phone]  Anesthesia type (None, local, MAC, general) ?       MAC   Please route your response to Corean Amsterdam, [facility]

## 2024-01-27 NOTE — Progress Notes (Signed)
 Chief Complaint: Discuss ERCP  HPI:    Mr. Poitras is a 48 y/o male with a past medical history as listed below including CHF, diabetes and status post lap appendectomy and cholecystectomy as well as previous MI on Plavix  (12/14/2023 echo with LVEF 40-45%), known to Dr. Legrand, who was referred to me by Jerel Gee, NP for discussion of an ERCP.    12/13/2023 MRCP with periportal edema and small amount of pericholecystic fluid, no evidence of biliary ductal dilation or choledocholithiasis, probable tiny gallstones with no definite radiographic evidence of acute cholecystitis.    12/14/2023 right upper quadrant ultrasound with sludge in the gallbladder with wall thickening and pericholecystic fluid.  Suggestive of acute cholecystitis.    12/15/2023 patient had intraoperative cholangiogram which showed a narrowed segment of the distal CBD just proximal to the ampulla with a small amount of contrast extending through this narrowed segment and into the small bowel.  There was not an obvious filling defect but the narrowed segment appeared irregular.  This was thought possibly due to a stricture, inflammation or conceivably a neoplasm.    12/16/2023 patient consulted by our service.  Discussed he was admitted with elevated LFTs and acute cholecystitis and went under laparoscopic cholecystectomy with IOC.  I discussed history of polysubstance abuse.  It was recommended he have an ERCP with Dr. Charlanne the following morning.  He decided to leave AGAINST MEDICAL ADVICE.    12/16/2023 CMP with an AST of 72, ALT 124, alk phos 225, total bili 1.9.  CBC with a hemoglobin of 12.    Today, patient presents to clinic and seems to be doing well.  He tells me that he continues with nausea but this is somewhat chronic for him.  He has actually gained about 10 pounds since having his gallbladder out and tells me that he has minimal discomfort in the right upper quadrant nothing like it was before.  He is eating well and has no  other GI complaints or concerns.  Aware of recommendations for ERCP.    Denies fever, chills, weight loss or vomiting.  Past Medical History:  Diagnosis Date   Anxiety    Arthritis    CHF (congestive heart failure) (HCC)    Complication of anesthesia    difficulty awakening following appendectomy    Diabetes mellitus without complication (HCC)    Dizziness    with humidity    H/O psychiatric hospitalization 07/03/2023   History of laparoscopic appendectomy 09/18/2013   Hx of CABG    Hyperlipidemia    Hypertension    Kidney stones    Major depressive disorder 07/03/2023   MYOCARDIAL INFARCTION 10/13/2008   Qualifier: Diagnosis of  By: Obie, MD, CODY Wolm Gosling    Numbness in right leg    pt relates to sciatica   Right kidney stone 08/06/2014   Noted incidentally on CT January 2016; ~0.6 cm, non-obstructing    Severe alcohol use disorder (HCC) 07/03/2023   Substance induced mood disorder (HCC) 07/03/2023   Tobacco user     Past Surgical History:  Procedure Laterality Date   APPENDECTOMY     CARDIAC CATHETERIZATION     CHOLECYSTECTOMY N/A 12/15/2023   Procedure: LAPAROSCOPIC CHOLECYSTECTOMY WITH INTRAOPERATIVE CHOLANGIOGRAM;  Surgeon: Teresa Lonni HERO, MD;  Location: [facility] OR;  Service: General;  Laterality: N/A;  POSSIBLE IOC , ICG DYE   CORONARY ARTERY BYPASS GRAFT     status post diaphragmatic wall infarction Rx BMS RCA 03-11-08    CORONARY  STENT PLACEMENT     CYSTOSCOPY WITH RETROGRADE PYELOGRAM, URETEROSCOPY AND STENT PLACEMENT Bilateral 03/16/2015   Procedure: CYSTOSCOPY WITH BILATERAL RETROGRADE PYELOGRAM, RIGHT URETEROSCOPY, STONE BASKETRY WITH LASER LITHOTRIPSY ON THE RIGHT,  AND BILATERAL STENT PLACEMENT;  Surgeon: Belvie LITTIE Clara, MD;  Location: [facility] ORS;  Service: Urology;  Laterality: Bilateral;   CYSTOSCOPY WITH RETROGRADE PYELOGRAM, URETEROSCOPY AND STENT PLACEMENT Left 04/02/2015   Procedure: CYSTOSCOPY WITH RETROGRADE PYELOGRAM, URETEROSCOPY REMOVAL  BILATERAL STENTS;  Surgeon: Belvie LITTIE Clara, MD;  Location: [facility] ORS;  Service: Urology;  Laterality: Left;   GALLBLADDER SURGERY  12/2023   HOLMIUM LASER APPLICATION Right 03/16/2015   Procedure: HOLMIUM LASER APPLICATION;  Surgeon: Belvie LITTIE Clara, MD;  Location: [facility] ORS;  Service: Urology;  Laterality: Right;   LAPAROSCOPIC APPENDECTOMY N/A 09/17/2013   Procedure: APPENDECTOMY LAPAROSCOPIC;  Surgeon: Vicenta DELENA Poli, MD;  Location: [facility] OR;  Service: General;  Laterality: N/A;   LEFT HEART CATHETERIZATION WITH CORONARY/GRAFT ANGIOGRAM N/A 10/19/2011   Procedure: LEFT HEART CATHETERIZATION WITH EL BILE;  Surgeon: Debby JONETTA Como, MD;  Location: [facility] CATH LAB;  Service: Cardiovascular;  Laterality: N/A;   ORTHOPEDIC SURGERY     TONSILLECTOMY      Current Outpatient Medications  Medication Sig Dispense Refill   atorvastatin  (LIPITOR ) 80 MG tablet Take 1 tablet (80 mg total) by mouth daily. 30 tablet 0   clopidogrel  (PLAVIX ) 75 MG tablet Take 75 mg by mouth daily.     fenofibrate  (TRICOR ) 48 MG tablet Take 1 tablet (48 mg total) by mouth daily. 30 tablet 0   isosorbide  mononitrate (IMDUR ) 60 MG 24 hr tablet Take 1 tablet (60 mg total) by mouth daily. 30 tablet 0   losartan  (COZAAR ) 100 MG tablet Take 1 tablet (100 mg total) by mouth daily. 30 tablet 0   metFORMIN (GLUCOPHAGE) 1000 MG tablet Take 500 mg by mouth 2 (two) times daily with a meal.     metoprolol  tartrate (LOPRESSOR ) 25 MG tablet Take 1 tablet (25 mg total) by mouth 2 (two) times daily. 30 tablet 0   MOUNJARO 2.5 MG/0.5ML Pen Inject 2.5 mg into the skin every Friday.     Multiple Vitamin (MULTI-VITAMIN DAILY PO) Take 1 Dose by mouth daily. [employer] vitamin pack takes 1 packet by mouth daily     naltrexone  (DEPADE) 50 MG tablet Take 1 tablet (50 mg total) by mouth daily. 30 tablet 0   nicotine  (NICODERM CQ  - DOSED IN MG/24 HOURS) 21 mg/24hr patch Place 1 patch (21 mg total) onto the skin daily. 28 patch 0    nitroGLYCERIN  (NITROSTAT ) 0.4 MG SL tablet Place 1 tablet (0.4 mg total) under the tongue every 5 (five) minutes as needed for chest pain. 25 tablet 6   ranolazine  (RANEXA ) 500 MG 12 hr tablet Take 1 tablet (500 mg total) by mouth 2 (two) times daily. 60 tablet 0   amoxicillin  (AMOXIL ) 500 MG tablet Take 500 mg by mouth every 8 (eight) hours.     DULoxetine  (CYMBALTA ) 30 MG capsule Take 3 capsules (90 mg total) by mouth daily. 90 capsule 0   traZODone  (DESYREL ) 50 MG tablet Take 1 tablet (50 mg total) by mouth at bedtime for 14 days. 14 tablet 0   No current facility-administered medications for this visit.    Allergies as of 01/27/2024 - Review Complete 01/27/2024  Allergen Reaction Noted   Celexa  [citalopram ] Itching and Rash 06/15/2012   Codeine Itching 12/04/2008    Family History  Adopted: Yes  Problem Relation  Age of Onset   Cancer Mother    Diabetes Mother    Hyperlipidemia Mother    Hypertension Mother    Heart failure Mother        Died age 56   Cancer Sister    Diabetes Sister    Hyperlipidemia Sister    Hypertension Sister    Stroke Maternal Grandmother    Liver disease Neg Hx    Esophageal cancer Neg Hx    Colon cancer Neg Hx     Social History   Socioeconomic History   Marital status: Divorced    Spouse name: Not on file   Number of children: 4   Years of education: Not on file   Highest education level: Not on file  Occupational History   Occupation: retired  Tobacco Use   Smoking status: Every Day    Current packs/day: 0.50    Average packs/day: 0.5 packs/day for 35.0 years (17.5 ttl pk-yrs)    Types: Cigarettes   Smokeless tobacco: Former    Types: Snuff    Quit date: 07/20/1994   Tobacco comments:    5-6 cigs/day  Vaping Use   Vaping status: Never Used  Substance and Sexual Activity   Alcohol use: Yes   Drug use: Yes    Types: Marijuana   Sexual activity: Not Currently  Other Topics Concern   Not on file  Social History Narrative   Lives  with girlfriend and two children.  He has four children.     Social Drivers of Corporate investment banker Strain: Not on file  Food Insecurity: Food Insecurity Present (12/12/2023)   Hunger Vital Sign    Worried About Running Out of Food in the Last Year: Often true    Ran Out of Food in the Last Year: Often true  Transportation Needs: No Transportation Needs (12/12/2023)   PRAPARE - Administrator, Civil Service (Medical): No    Lack of Transportation (Non-Medical): No  Physical Activity: Not on file  Stress: Not on file  Social Connections: Not on file  Intimate Partner Violence: Not At Risk (12/12/2023)   Humiliation, Afraid, Rape, and Kick questionnaire    Fear of Current or Ex-Partner: No    Emotionally Abused: No    Physically Abused: No    Sexually Abused: No    Review of Systems:    Constitutional: No weight loss, fever or chills Cardiovascular: No chest pain Respiratory: No SOB  Gastrointestinal: See HPI and otherwise negative   Physical Exam:  Vital signs: BP 118/68   Pulse 71   Ht 5' 10 (1.778 m)   Wt 179 lb (81.2 kg)   BMI 25.68 kg/m    Constitutional:   Pleasant Caucasian male appears to be in NAD, Well developed, Well nourished, alert and cooperative Respiratory: Respirations even and unlabored. Lungs clear to auscultation bilaterally.   No wheezes, crackles, or rhonchi.  Cardiovascular: Normal S1, S2. No MRG. Regular rate and rhythm. No peripheral edema, cyanosis or pallor.  Gastrointestinal:  Soft, nondistended, mild right upper quadrant TTP no rebound or guarding. Normal bowel sounds. No appreciable masses or hepatomegaly.+ Visible scars from recent cholecystectomy Psychiatric: Oriented to person, place and time. Demonstrates good judgement and reason without abnormal affect or behaviors.  RELEVANT LABS AND IMAGING: CBC    Component Value Date/Time   WBC 5.6 12/16/2023 0440   RBC 3.84 (L) 12/16/2023 0440   HGB 12.0 (L) 12/16/2023 0440    HCT 35.1 (L) 12/16/2023  0440   PLT 271 12/16/2023 0440   MCV 91.4 12/16/2023 0440   MCH 31.3 12/16/2023 0440   MCHC 34.2 12/16/2023 0440   RDW 15.1 12/16/2023 0440   LYMPHSABS 1.9 05/13/2022 1531   MONOABS 0.6 05/13/2022 1531   EOSABS 0.1 05/13/2022 1531   BASOSABS 0.0 05/13/2022 1531    CMP     Component Value Date/Time   NA 136 12/16/2023 0440   NA 139 02/18/2018 1026   K 4.4 12/16/2023 0440   CL 103 12/16/2023 0440   CO2 23 12/16/2023 0440   GLUCOSE 118 (H) 12/16/2023 0440   BUN 10 12/16/2023 0440   BUN 11 02/18/2018 1026   CREATININE 0.78 12/16/2023 0440   CREATININE 1.05 07/25/2018 1446   CALCIUM  9.1 12/16/2023 0440   PROT 7.2 12/16/2023 0440   PROT 7.4 02/18/2018 1026   ALBUMIN 3.2 (L) 12/16/2023 0440   ALBUMIN 4.8 02/18/2018 1026   AST 72 (H) 12/16/2023 0440   ALT 124 (H) 12/16/2023 0440   ALKPHOS 225 (H) 12/16/2023 0440   BILITOT 1.9 (H) 12/16/2023 0440   BILITOT 0.4 02/18/2018 1026   GFRNONAA >60 12/16/2023 0440   GFRNONAA 87 07/25/2018 1446   GFRAA >60 08/07/2019 1706   GFRAA 101 07/25/2018 1446    Assessment: 1.  Abnormal intraoperative cholangiogram: Showing stricture with possibility of cancer, ERCP recommended during recent hospitalization in May but patient left AMA, now wanting to proceed with this procedure 2.  Elevated LFTs: That related to acute cholecystitis in the hospital, still elevated at last draw in May, will recheck today  3. History of polysubstance abuse  Plan: 1.  Recheck CBC, CMP and PT/INR 2.  Unfortunately could not arrange for ERCP at the time of patient's visit.  Will discuss further with Dr. Avram and try to get this figured out for him.  Did go ahead and discussed risk benefits connotations and alternatives and the patient agrees to proceed. 3.  Patient will need to hold his Plavix  for 5 days prior to time of procedure.  We communicate with his prescribing physician to ensure this is acceptable for him. 4.  Also need to hold  Mounjaro for a week. 5.  Patient does belong to Dr. Legrand after recent hospitalization.  Delon Failing, PA-C [facility] 01/27/2024, 8:28 AM  Cc: Jerel Gee, NP

## 2024-01-27 NOTE — Patient Instructions (Signed)
 Your provider has requested that you go to the basement level for lab work before leaving today. Press "B" on the elevator. The lab is located at the first door on the left as you exit the elevator.  _______________________________________________________  If your blood pressure at your visit was 140/90 or greater, please contact your primary care physician to follow up on this.  _______________________________________________________  If you are age 48 or older, your body mass index should be between 23-30. Your Body mass index is 25.68 kg/m. If this is out of the aforementioned range listed, please consider follow up with your Primary Care Provider.  If you are age 68 or younger, your body mass index should be between 19-25. Your Body mass index is 25.68 kg/m. If this is out of the aformentioned range listed, please consider follow up with your Primary Care Provider.   ________________________________________________________  The [facility] providers would like to encourage you to use MYCHART to communicate with providers for non-urgent requests or questions.  Due to long hold times on the telephone, sending your provider a message by [facility] may be a faster and more efficient way to get a response.  Please allow 48 business hours for a response.  Please remember that this is for non-urgent requests.  _______________________________________________________

## 2024-01-27 NOTE — Telephone Encounter (Signed)
   Patient Name: [name]  DOB: 1976/04/05 MRN: [id number]  Primary Cardiologist: Lonni Cash, MD  Chart reviewed as part of pre-operative protocol coverage.   Patient currently pending ERCP, date TBD.  Reviewed with Dr. Cash, patients primary cardiologist. Per Dr. Cash, OK to hold Plavix  for his upcoming procedure.  Per office protocol patient can hold Plavix  75 mg daily for 5 days prior to procedure.  Please resume when safe to do so from bleeding standpoint.  Please call the office with any questions.  Mande Auvil D Chani Ghanem, NP 01/27/2024, 2:38 PM

## 2024-01-27 NOTE — Progress Notes (Signed)
 Please let patient know that his liver enzymes are all back to normal which is great news.  Labs look stable.  I am still waiting to hear back on when we can schedule his ERCP.  Thanks, JL L

## 2024-01-30 NOTE — Progress Notes (Addendum)
 Additional addendum  Patients case was reviewed by biliary MD's and it was recommended to pursue EUS now in light of normal LFT's and risk profile of procedures  Lupita CHARLENA Commander, MD, [facility]   ____________________________________________________________  Attending physician addendum:  Thank you for sending this case to me. I have reviewed the entire note and agree with the plan.  Thank you for communicating with our biliary endoscopist so they can review the case for consideration of outpatient ERCP. Unfortunate he would not stay to have it done as inpatient, scheduling for this procedure is challenging with limited hospital-based outpatient endoscopy department availability. Reassuring that his LFTs are currently normal  Victory Brand, MD  ____________________________________________________________

## 2024-01-31 ENCOUNTER — Telehealth: Payer: Self-pay | Admitting: Physician Assistant

## 2024-01-31 NOTE — Telephone Encounter (Signed)
 01/31/2024 telephone call  1:35 PM  Attempted to call the patient on his mobile number to discuss recommendations for an EUS at this point rather than an ERCP.  Patient did not answer the phone.  Told him to call us  back so that we can discuss this in more detail.    At time of appointment he did tell me he was up for what ever we recommended.  Hopefully he calls back so we can discuss further.  If not we will go ahead and have nursing staff call him to try and schedule EUS.  Delon Failing, PA-C

## 2024-02-01 ENCOUNTER — Telehealth: Payer: Self-pay | Admitting: Physician Assistant

## 2024-02-01 NOTE — Telephone Encounter (Signed)
 Got it. Just let me know when patient ends up being scheduled. Thanks. GM

## 2024-02-01 NOTE — Telephone Encounter (Signed)
 Telephone call  02/01/2024 9:05 AM  Tried to call the patient again in regards to recommendations for EUS.  At this point I will forward the message to our nursing staff so they can continue to try and follow-up with him to get him scheduled for an EUS with Dr. Wilhelmenia.    When this is scheduled will let Dr. Wilhelmenia know.  Delon Failing, PA-C

## 2024-02-02 ENCOUNTER — Other Ambulatory Visit: Payer: Self-pay

## 2024-02-02 DIAGNOSIS — K839 Disease of biliary tract, unspecified: Secondary | ICD-10-CM

## 2024-02-02 NOTE — Telephone Encounter (Signed)
 Mounjaro and Plavix  hold entered   Left message on machine to call back

## 2024-02-02 NOTE — Telephone Encounter (Signed)
 EUS has been set up for 04/11/24 at [facility] with GM at 1045 am   Need plavix  and mounjaro hold

## 2024-02-02 NOTE — Telephone Encounter (Signed)
 Left message on machine to call back

## 2024-02-03 NOTE — Telephone Encounter (Signed)
 FYI the pt has been set up and all information mailed to the pt and sent to the pt My Chart.  I have not been able to reach him by phone

## 2024-02-03 NOTE — Telephone Encounter (Signed)
 Left message on machine to call back

## 2024-02-13 ENCOUNTER — Emergency Department (HOSPITAL_COMMUNITY)
Admission: EM | Admit: 2024-02-13 | Discharge: 2024-02-13 | Disposition: A | Discharge status: Home / Self Care | Attending: Emergency Medicine | Admitting: Emergency Medicine

## 2024-02-13 ENCOUNTER — Emergency Department (HOSPITAL_COMMUNITY)

## 2024-02-13 ENCOUNTER — Other Ambulatory Visit: Payer: Self-pay

## 2024-02-13 DIAGNOSIS — T402X4A Poisoning by other opioids, undetermined, initial encounter: Secondary | ICD-10-CM | POA: Diagnosis not present

## 2024-02-13 DIAGNOSIS — R4182 Altered mental status, unspecified: Secondary | ICD-10-CM | POA: Diagnosis present

## 2024-02-13 DIAGNOSIS — Z7901 Long term (current) use of anticoagulants: Secondary | ICD-10-CM | POA: Diagnosis not present

## 2024-02-13 LAB — CBC WITH DIFFERENTIAL/PLATELET
Abs Immature Granulocytes: 0.03 K/uL (ref 0.00–0.07)
Basophils Absolute: 0.1 K/uL (ref 0.0–0.1)
Basophils Relative: 1 %
Eosinophils Absolute: 0.5 K/uL (ref 0.0–0.5)
Eosinophils Relative: 5 %
HCT: 33.4 % — ABNORMAL LOW (ref 39.0–52.0)
Hemoglobin: 10.8 g/dL — ABNORMAL LOW (ref 13.0–17.0)
Immature Granulocytes: 0 %
Lymphocytes Relative: 25 %
Lymphs Abs: 2.4 K/uL (ref 0.7–4.0)
MCH: 30.6 pg (ref 26.0–34.0)
MCHC: 32.3 g/dL (ref 30.0–36.0)
MCV: 94.6 fL (ref 80.0–100.0)
Monocytes Absolute: 0.5 K/uL (ref 0.1–1.0)
Monocytes Relative: 5 %
Neutro Abs: 6 K/uL (ref 1.7–7.7)
Neutrophils Relative %: 64 %
Platelets: 417 K/uL — ABNORMAL HIGH (ref 150–400)
RBC: 3.53 MIL/uL — ABNORMAL LOW (ref 4.22–5.81)
RDW: 14.1 % (ref 11.5–15.5)
WBC: 9.4 K/uL (ref 4.0–10.5)
nRBC: 0 % (ref 0.0–0.2)

## 2024-02-13 LAB — COMPREHENSIVE METABOLIC PANEL WITH GFR
ALT: 24 U/L (ref 0–44)
AST: 31 U/L (ref 15–41)
Albumin: 3.8 g/dL (ref 3.5–5.0)
Alkaline Phosphatase: 74 U/L (ref 38–126)
Anion gap: 11 (ref 5–15)
BUN: 12 mg/dL (ref 6–20)
CO2: 22 mmol/L (ref 22–32)
Calcium: 9.5 mg/dL (ref 8.9–10.3)
Chloride: 105 mmol/L (ref 98–111)
Creatinine, Ser: 0.83 mg/dL (ref 0.61–1.24)
GFR, Estimated: >60 mL/min (ref >60)
Glucose, Bld: 109 mg/dL — ABNORMAL HIGH (ref 70–99)
Potassium: 3.6 mmol/L (ref 3.5–5.1)
Sodium: 138 mmol/L (ref 135–145)
Total Bilirubin: 0.6 mg/dL (ref 0.0–1.2)
Total Protein: 7 g/dL (ref 6.5–8.1)

## 2024-02-13 LAB — ETHANOL: Alcohol, Ethyl (B): 134 mg/dL — ABNORMAL HIGH (ref <15)

## 2024-02-13 LAB — RAPID URINE DRUG SCREEN, HOSP PERFORMED
Amphetamines: NOT DETECTED
Barbiturates: NOT DETECTED
Benzodiazepines: NOT DETECTED
Cocaine: POSITIVE — AB
Opiates: NOT DETECTED
Tetrahydrocannabinol: NOT DETECTED

## 2024-02-13 LAB — TROPONIN I (HIGH SENSITIVITY)
Troponin I (High Sensitivity): 5 ng/L (ref <18)
Troponin I (High Sensitivity): 65 ng/L — ABNORMAL HIGH (ref <18)

## 2024-02-13 MED ORDER — SODIUM CHLORIDE 0.9 % IV BOLUS
1000.0000 mL | Freq: Once | INTRAVENOUS | Status: AC
  Administered 2024-02-13: 1000 mL via INTRAVENOUS

## 2024-02-13 MED ORDER — ASPIRIN 81 MG PO CHEW
324.0000 mg | CHEWABLE_TABLET | Freq: Once | ORAL | Status: AC
  Administered 2024-02-13: 324 mg via ORAL
  Filled 2024-02-13: qty 4

## 2024-02-13 NOTE — ED Notes (Signed)
 Pt desiring to go home. RN discussed with him likelihood he would stop breathing again and there would be no one to monitor him or administer narcan . He said that would be ok and he would like to be DNR; states he is not suicidal but would be ok if he died. Sons at bedside.

## 2024-02-13 NOTE — ED Notes (Signed)
 Pt's sats coming up with bagging and color returning. Pt then becoming alert. States he did not take anything that he knows up but maybe smoked some weed.

## 2024-02-13 NOTE — ED Notes (Addendum)
 2 mg narcan  given at this time IVP. Pt being bagged and preparation being made to intubate.

## 2024-02-13 NOTE — ED Notes (Addendum)
 Pt brought in by 2 sons. Pt is diaphoretic and having trouble getting out of the car. Appears intoxicated and endorses drinking moonshine today. Sons report they went to pick him up and he grabbed his chest at times and they were concerned about his breathing. Pt in wheelchair to bring back to room and became obtunded for about 20 seconds then woke up. PT back on stretcher and EDP in room. Pt then became unresponsive and pale and sats in 60s. No respiratory drive. Pt bagged by EDP and IVs started.

## 2024-02-13 NOTE — ED Provider Notes (Signed)
 [place] EMERGENCY DEPARTMENT AT [facility] Provider Note   CSN: [id number] Arrival date & time: 02/13/24  9570     Patient presents with: Altered Mental Status   [name] is a 48 y.o. male.  {Add pertinent medical, surgical, social history, OB history to YEP:67052} The history is provided by the patient and a relative. The history is limited by the condition of the patient (Altered mental status).  Altered Mental Status  He was brought in by family because he was found poorly responsive.  He had been drinking.  He was noted to be having periods of apnea.    Prior to Admission medications   Medication Sig Start Date End Date Taking? Authorizing Provider  amoxicillin  (AMOXIL ) 500 MG tablet Take 500 mg by mouth every 8 (eight) hours. 12/03/23   [provider]  atorvastatin  (LIPITOR ) 80 MG tablet Take 1 tablet (80 mg total) by mouth daily. 07/04/23 12/10/24  Kennyth Starleen RAMAN, MD  clopidogrel  (PLAVIX ) 75 MG tablet Take 75 mg by mouth daily. 10/27/23   [provider]  DULoxetine  (CYMBALTA ) 30 MG capsule Take 3 capsules (90 mg total) by mouth daily. 07/04/23   Kennyth Starleen RAMAN, MD  fenofibrate  (TRICOR ) 48 MG tablet Take 1 tablet (48 mg total) by mouth daily. 07/03/23   Kennyth Starleen RAMAN, MD  isosorbide  mononitrate (IMDUR ) 60 MG 24 hr tablet Take 1 tablet (60 mg total) by mouth daily. 07/04/23   Kennyth Starleen RAMAN, MD  losartan  (COZAAR ) 100 MG tablet Take 1 tablet (100 mg total) by mouth daily. 07/03/23 12/10/24  Kennyth Starleen RAMAN, MD  metFORMIN (GLUCOPHAGE) 1000 MG tablet Take 500 mg by mouth 2 (two) times daily with a meal. 05/15/23   [provider]  metoprolol  tartrate (LOPRESSOR ) 25 MG tablet Take 1 tablet (25 mg total) by mouth 2 (two) times daily. 07/03/23   Parker, Alvin S, MD  MOUNJARO 2.5 MG/0.5ML Pen Inject 2.5 mg into the skin every Friday. 06/19/23   [provider]  Multiple Vitamin (MULTI-VITAMIN DAILY PO) Take 1 Dose by mouth  daily. [employer] vitamin pack takes 1 packet by mouth daily    [provider]  naltrexone  (DEPADE) 50 MG tablet Take 1 tablet (50 mg total) by mouth daily. 07/04/23   Kennyth Starleen RAMAN, MD  nicotine  (NICODERM CQ  - DOSED IN MG/24 HOURS) 21 mg/24hr patch Place 1 patch (21 mg total) onto the skin daily. 07/04/23   Kennyth Starleen RAMAN, MD  nitroGLYCERIN  (NITROSTAT ) 0.4 MG SL tablet Place 1 tablet (0.4 mg total) under the tongue every 5 (five) minutes as needed for chest pain. 07/20/19   Newlin, Enobong, MD  ranolazine  (RANEXA ) 500 MG 12 hr tablet Take 1 tablet (500 mg total) by mouth 2 (two) times daily. 07/03/23 12/10/24  Kennyth Starleen RAMAN, MD  traZODone  (DESYREL ) 50 MG tablet Take 1 tablet (50 mg total) by mouth at bedtime for 14 days. 07/03/23 12/10/24  Kennyth Starleen RAMAN, MD    Allergies: Celexa  [citalopram ] and Codeine    Review of Systems  Unable to perform ROS: Mental status change    Updated Vital Signs BP (!) 172/91   Pulse (!) 125   Resp (!) 24   SpO2 100%   Physical Exam Vitals and nursing note reviewed.   48 year old male, unresponsive and diaphoretic and with periods of apnea. Vital signs are significant for elevated heart rate and blood pressure. Oxygen saturation is 65%, which is hypoxic. Head is normocephalic and atraumatic.  Pupils are 2-3 mm and minimally reactive. Lungs have minimal air movement. Heart is tachycardic without murmur. Abdomen is soft, flat. Extremities have 1+ edema. Neurologic: Unresponsive with periods of apnea.  No gag reflex.  (all labs ordered are listed, but only abnormal results are displayed) Labs Reviewed  COMPREHENSIVE METABOLIC PANEL WITH GFR  ETHANOL  CBC WITH DIFFERENTIAL/PLATELET  RAPID URINE DRUG SCREEN, HOSP PERFORMED  TROPONIN I (HIGH SENSITIVITY)    EKG: EKG Interpretation Date/Time:  Sunday February 13 2024 04:46:33 EDT Ventricular Rate:  116 PR Interval:  129 QRS Duration:  99 QT Interval:  273 QTC Calculation: 380 R  Axis:   57  Text Interpretation: Sinus tachycardia Probable left atrial enlargement Repol abnrm, severe global ischemia (LM/MVD) When compared with ECG of 12/13/2023, HEART RATE has increased REPOLARIZATION ABNORMALITY is now present Confirmed by Raford Lenis ([id number]) on 02/13/2024 4:51:32 AM   EKG Interpretation Date/Time:  Sunday February 13 2024 05:09:06 EDT Ventricular Rate:  109 PR Interval:  179 QRS Duration:  98 QT Interval:  296 QTC Calculation: 399 R Axis:   51  Text Interpretation: Sinus tachycardia Probable left atrial enlargement Low voltage, precordial leads Abnormal R-wave progression, early transition Repol abnrm suggests ischemia, diffuse leads When compared with ECG of EARLIER SAME DATE REPOLARIZATION ABNORMALITY has improved Confirmed by Raford Lenis ([id number]) on 02/13/2024 5:16:16 AM        Radiology: No results found.  {Document cardiac monitor, telemetry assessment procedure when appropriate:32947} Procedures  Cardiac monitor shows sinus tachycardia, per my interpretation. Medications Ordered in the ED  sodium chloride  0.9 % bolus 1,000 mL (has no administration in time range)  aspirin  chewable tablet 324 mg (has no administration in time range)      {Click here for ABCD2, HEART and other calculators REFRESH Note before signing:1}                              Medical Decision Making Amount and/or Complexity of Data Reviewed Labs: ordered. Radiology: ordered.  Risk OTC drugs.   Patient arrived in the room with unresponsive state and periods of apnea and progressive hypoxia.  I initiated ventilation with bag/valve/mask.  Because of concern for possible opioid overdose, I ordered a dose of naloxone .  I also prepared to intubate if he did not respond to naloxone .  Following naloxone , patient did improve his ventilations and he woke up.  He adamantly denies using any illicit drugs but states he did take 2 pills for his back pain.  He apparently had been complaining of  chest pain earlier but is denying pain now.  I reviewed his electrocardiogram, and my interpretation is sinus tachycardia with diffuse ST depression concerning for global ischemia, which is new compared with prior.  I have ordered a dose of aspirin .  It is possible this is related to his period of hypoxia and not actual myocardial ischemia.  Family member states that he had been in the hospital recently and did require naloxone  at that time, although I cannot find any record of that having happened.  On review of past records, I note hospitalization 12/11/2023 for unstable angina.  Patient initially wanted to leave, advised that that was not advisable as naloxone  could wear off before his narcotics had worn off and he could die and he is willing to stay at this point.  I have ordered screening labs including troponin x 2.  I have reviewed his repeat ECG, and my  interpretation is significant improvement in repolarization abnormality suggesting that it is related to his episode of hypoxia and not myocardial ischemia.  Patient repeatedly has stated that he wishes to leave.  I have repeatedly explained to him that he needs to be observed in the emergency department for 4 hours to make sure he is not at risk for repeat respiratory arrest because of narcotics in his system.  I have reviewed his laboratory tests, my interpretation is ethanol level above the goal level of intoxication but not excessively high, elevated random glucose which will need to be followed as an outpatient, normal initial troponin, mild anemia which has progressed slightly compared with 01/27/2024.  Repeat troponin is pending.  At this point, patient is agreeing to stay until his 4 hours of observation have been completed, but he is repeatedly stating that he wishes to leave.  I have thoroughly explained to him and family the risk of death if he leaves.  {Document critical care time when appropriate  Document review of labs and clinical  decision tools ie CHADS2VASC2, etc  Document your independent review of radiology images and any outside records  Document your discussion with family members, caretakers and with consultants  Document social determinants of health affecting pt's care  Document your decision making why or why not admission, treatments were needed:32947:::1}   Final diagnoses:  None    ED Discharge Orders     None

## 2024-02-13 NOTE — ED Notes (Signed)
 Pt pacing in room, pt states that he is ready to go home, MD aware of trop, pt notified of trop, states that the md already explained it to him and he wants to go home, advised to return for any concerns or chest pain, pt verbalized understanding, pt verbalized understanding d/c and follow up instructions, family at bedside, pt ambulatory from department, resps even and unlabored

## 2024-02-13 NOTE — ED Provider Notes (Signed)
 Received signout.  Suspected opioid overdose status post Narcan .  Observe for 4 hours.  Does have repeat troponin pending.  Patient reevaluated clinically sober, ambulating with steady gait.  Not having chest pain or shortness of breath.  His repeat troponin was slightly elevated at 65.  Patient wants to leave the hospital and does not want admission.  I had a long discussion regarding waiting for cardiology consult, but patient does not want to wait.  I discussed my concern that he could be having a silent heart attack.  I discussed risks associated with leaving to include MI, arrhythmia, sudden cardiac death, or permanent disability.  Patient is clinically sober, linear thought content.  As a next step, discussed following up with his primary doctor and his cardiologist.  Patient voiced understanding and will follow-up with his heart doctor first thing in the morning.  Encouraged him to return to the emergency department if he develops any new or worsening symptoms.  Will discharge at the patient's request.   Neysa Caron PARAS, DO 02/13/24 [phone]

## 2024-02-13 NOTE — Discharge Instructions (Signed)
 Please follow-up with your cardiologist as soon as possible for your elevated troponin.  Return immediately if develop fevers, chills, chest pain, shortness of breath, feel your heart is racing or any new or worsening symptoms that are concerning to you.

## 2024-03-24 ENCOUNTER — Telehealth: Payer: Self-pay | Admitting: Family Medicine

## 2024-03-24 NOTE — Telephone Encounter (Signed)
 Copied from CRM #[id number]. Topic: Appointments - Scheduling Inquiry for Clinic >> Mar 21, 2024 11:30 AM Corin V wrote: Reason for CRM: Patient stated he had come into the office and spoken to possibly Delon (he could not remember her name but did state she was [place] American) and she was going to ask if Dr. Duanne would be willing to see him again as a patient since he just moved back to the area. No documentation found on this. Please confirm with Dr. Duanne and call patient back. Call back: [phone] Jaclyn Gorley  03/24/24 - Left message to return call for scheduling; approved by Dr. Duanne.

## 2024-04-06 ENCOUNTER — Telehealth: Payer: Self-pay | Admitting: Gastroenterology

## 2024-04-06 NOTE — Telephone Encounter (Signed)
 Inbound call from pt significant other requesting to reschedule the pt hospital procedure for the 23 rd. Patient is significant is requesting a call back at [phone]. Please advise.

## 2024-04-06 NOTE — Telephone Encounter (Signed)
 Got it. When ready in future, we can be of assistance. I will remove from my list. I am going to send you a patient, whom we can add to this EUS slot (separate inbox message). Thanks. GM  FYI CEG on your patient.

## 2024-04-06 NOTE — Telephone Encounter (Signed)
 Dr Wilhelmenia the pt called and states that he is not ready to proceed with EUS on 9/23 at this time. We discussed the importance of keeping the appt but he tells me that he is not ready to proceed.  I did advise that he should think about it and discuss with his wife and call back when he is ready to get back on the schedule.

## 2024-04-11 ENCOUNTER — Ambulatory Visit (HOSPITAL_COMMUNITY): Admission: RE | Admit: 2024-04-11 | Source: Home / Self Care | Admitting: Gastroenterology

## 2024-04-11 ENCOUNTER — Encounter (HOSPITAL_COMMUNITY): Admission: RE | Payer: Self-pay | Source: Home / Self Care

## 2024-04-11 SURGERY — ULTRASOUND, UPPER GI TRACT, ENDOSCOPIC
Anesthesia: Monitor Anesthesia Care

## 2024-04-20 NOTE — Progress Notes (Deleted)
 No chief complaint on file.  History of Present Illness: 48 year old male with history of CAD s/p CABG, HTN, HLD and tobacco abuse who is here today for follow up. His coronary history began in 2009 with an inferior MI requiring PCI of the RCA and subsequent 4V CABG. He then presented in 2010 with a NSTEMI requiring PCI of the Circumflex. Cardiac cath in 2013 with stable CAD and bypass grafts. He was admitted to [facility] in May 2025 with chest pain and was found to have acute cholecystitis and had a cholecystectomy. Echo May 2025 with LVEF=40-45%. Global hypokinesis. No valve disease. Dilated aortic root at 5.0 cm.   He is here today for follow up. The patient denies any chest pain, dyspnea, palpitations, lower extremity edema, orthopnea, PND, dizziness, near syncope or syncope.   Primary Care Physician: Jerel Gee, NP  Past Medical History:  Diagnosis Date   Anxiety    Arthritis    CHF (congestive heart failure) (HCC)    Complication of anesthesia    difficulty awakening following appendectomy    Diabetes mellitus without complication (HCC)    Dizziness    with humidity    H/O psychiatric hospitalization 07/03/2023   History of laparoscopic appendectomy 09/18/2013   Hx of CABG    Hyperlipidemia    Hypertension    Kidney stones    Major depressive disorder 07/03/2023   MYOCARDIAL INFARCTION 10/13/2008   Qualifier: Diagnosis of  By: Obie, MD, CODY Wolm Gosling    Numbness in right leg    pt relates to sciatica   Right kidney stone 08/06/2014   Noted incidentally on CT January 2016; ~0.6 cm, non-obstructing    Severe alcohol use disorder (HCC) 07/03/2023   Substance induced mood disorder (HCC) 07/03/2023   Tobacco user     Past Surgical History:  Procedure Laterality Date   APPENDECTOMY     CARDIAC CATHETERIZATION     CHOLECYSTECTOMY N/A 12/15/2023   Procedure: LAPAROSCOPIC CHOLECYSTECTOMY WITH INTRAOPERATIVE CHOLANGIOGRAM;  Surgeon: Teresa Lonni HERO, MD;  Location: [facility]  OR;  Service: General;  Laterality: N/A;  POSSIBLE IOC , ICG DYE   CORONARY ARTERY BYPASS GRAFT     status post diaphragmatic wall infarction Rx BMS RCA 03-11-08    CORONARY STENT PLACEMENT     CYSTOSCOPY WITH RETROGRADE PYELOGRAM, URETEROSCOPY AND STENT PLACEMENT Bilateral 03/16/2015   Procedure: CYSTOSCOPY WITH BILATERAL RETROGRADE PYELOGRAM, RIGHT URETEROSCOPY, STONE BASKETRY WITH LASER LITHOTRIPSY ON THE RIGHT,  AND BILATERAL STENT PLACEMENT;  Surgeon: Belvie LITTIE Clara, MD;  Location: [facility] ORS;  Service: Urology;  Laterality: Bilateral;   CYSTOSCOPY WITH RETROGRADE PYELOGRAM, URETEROSCOPY AND STENT PLACEMENT Left 04/02/2015   Procedure: CYSTOSCOPY WITH RETROGRADE PYELOGRAM, URETEROSCOPY REMOVAL BILATERAL STENTS;  Surgeon: Belvie LITTIE Clara, MD;  Location: [facility] ORS;  Service: Urology;  Laterality: Left;   GALLBLADDER SURGERY  12/2023   HOLMIUM LASER APPLICATION Right 03/16/2015   Procedure: HOLMIUM LASER APPLICATION;  Surgeon: Belvie LITTIE Clara, MD;  Location: [facility] ORS;  Service: Urology;  Laterality: Right;   LAPAROSCOPIC APPENDECTOMY N/A 09/17/2013   Procedure: APPENDECTOMY LAPAROSCOPIC;  Surgeon: Vicenta DELENA Poli, MD;  Location: [facility] OR;  Service: General;  Laterality: N/A;   LEFT HEART CATHETERIZATION WITH CORONARY/GRAFT ANGIOGRAM N/A 10/19/2011   Procedure: LEFT HEART CATHETERIZATION WITH EL BILE;  Surgeon: Debby JONETTA Como, MD;  Location: [facility] CATH LAB;  Service: Cardiovascular;  Laterality: N/A;   ORTHOPEDIC SURGERY     TONSILLECTOMY      Current Outpatient Medications  Medication  Sig Dispense Refill   amoxicillin  (AMOXIL ) 500 MG tablet Take 500 mg by mouth every 8 (eight) hours.     atorvastatin  (LIPITOR ) 80 MG tablet Take 1 tablet (80 mg total) by mouth daily. 30 tablet 0   clopidogrel  (PLAVIX ) 75 MG tablet Take 75 mg by mouth daily.     DULoxetine  (CYMBALTA ) 30 MG capsule Take 3 capsules (90 mg total) by mouth daily. 90 capsule 0   fenofibrate  (TRICOR ) 48 MG tablet  Take 1 tablet (48 mg total) by mouth daily. 30 tablet 0   isosorbide  mononitrate (IMDUR ) 60 MG 24 hr tablet Take 1 tablet (60 mg total) by mouth daily. 30 tablet 0   losartan  (COZAAR ) 100 MG tablet Take 1 tablet (100 mg total) by mouth daily. 30 tablet 0   metFORMIN (GLUCOPHAGE) 1000 MG tablet Take 500 mg by mouth 2 (two) times daily with a meal.     metoprolol  tartrate (LOPRESSOR ) 25 MG tablet Take 1 tablet (25 mg total) by mouth 2 (two) times daily. 30 tablet 0   MOUNJARO 2.5 MG/0.5ML Pen Inject 2.5 mg into the skin every Friday.     Multiple Vitamin (MULTI-VITAMIN DAILY PO) Take 1 Dose by mouth daily. [employer] vitamin pack takes 1 packet by mouth daily     naltrexone  (DEPADE) 50 MG tablet Take 1 tablet (50 mg total) by mouth daily. 30 tablet 0   nicotine  (NICODERM CQ  - DOSED IN MG/24 HOURS) 21 mg/24hr patch Place 1 patch (21 mg total) onto the skin daily. 28 patch 0   nitroGLYCERIN  (NITROSTAT ) 0.4 MG SL tablet Place 1 tablet (0.4 mg total) under the tongue every 5 (five) minutes as needed for chest pain. 25 tablet 6   ranolazine  (RANEXA ) 500 MG 12 hr tablet Take 1 tablet (500 mg total) by mouth 2 (two) times daily. 60 tablet 0   traZODone  (DESYREL ) 50 MG tablet Take 1 tablet (50 mg total) by mouth at bedtime for 14 days. 14 tablet 0   No current facility-administered medications for this visit.    Allergies  Allergen Reactions   Celexa  [Citalopram ] Itching and Rash   Codeine Itching    Social History   Socioeconomic History   Marital status: Divorced    Spouse name: Not on file   Number of children: 4   Years of education: Not on file   Highest education level: Not on file  Occupational History   Occupation: retired  Tobacco Use   Smoking status: Every Day    Current packs/day: 0.50    Average packs/day: 0.5 packs/day for 35.0 years (17.5 ttl pk-yrs)    Types: Cigarettes   Smokeless tobacco: Former    Types: Snuff    Quit date: 07/20/1994   Tobacco comments:    5-6 cigs/day   Vaping Use   Vaping status: Never Used  Substance and Sexual Activity   Alcohol use: Yes   Drug use: Yes    Types: Marijuana   Sexual activity: Not Currently  Other Topics Concern   Not on file  Social History Narrative   Lives with girlfriend and two children.  He has four children.     Social Drivers of Corporate investment banker Strain: Not on file  Food Insecurity: Food Insecurity Present (12/12/2023)   Hunger Vital Sign    Worried About Running Out of Food in the Last Year: Often true    Ran Out of Food in the Last Year: Often true  Transportation Needs: No Transportation Needs (  12/12/2023)   PRAPARE - Administrator, Civil Service (Medical): No    Lack of Transportation (Non-Medical): No  Physical Activity: Not on file  Stress: Not on file  Social Connections: Not on file  Intimate Partner Violence: Not At Risk (12/12/2023)   Humiliation, Afraid, Rape, and Kick questionnaire    Fear of Current or Ex-Partner: No    Emotionally Abused: No    Physically Abused: No    Sexually Abused: No    Family History  Adopted: Yes  Problem Relation Age of Onset   Cancer Mother    Diabetes Mother    Hyperlipidemia Mother    Hypertension Mother    Heart failure Mother        Died age 62   Cancer Sister    Diabetes Sister    Hyperlipidemia Sister    Hypertension Sister    Stroke Maternal Grandmother    Liver disease Neg Hx    Esophageal cancer Neg Hx    Colon cancer Neg Hx     Review of Systems:  As stated in the HPI and otherwise negative.   There were no vitals taken for this visit.  Physical Examination: General: Well developed, well nourished, NAD  HEENT: OP clear, mucus membranes moist  SKIN: warm, dry. No rashes. Neuro: No focal deficits  Musculoskeletal: Muscle strength 5/5 all ext  Psychiatric: Mood and affect normal  Neck: No JVD, no carotid bruits, no thyromegaly, no lymphadenopathy.  Lungs:Clear bilaterally, no wheezes, rhonci,  crackles Cardiovascular: Regular rate and rhythm. No murmurs, gallops or rubs. Abdomen:Soft. Bowel sounds present. Non-tender.  Extremities: No lower extremity edema. Pulses are 2 + in the bilateral DP/PT.  EKG:  EKG is *** ordered today. The ekg ordered today demonstrates   Recent Labs: 06/30/2023: TSH 2.905 12/11/2023: B Natriuretic Peptide 36.8; Magnesium  1.2 02/13/2024: ALT 24; BUN 12; Creatinine, Ser 0.83; Hemoglobin 10.8; Platelets 417; Potassium 3.6; Sodium 138   Lipid Panel    Component Value Date/Time   CHOL 134 06/30/2023 1935   CHOL 130 08/24/2019 0911   TRIG 260 (H) 06/30/2023 1935   HDL 52 06/30/2023 1935   HDL 36 (L) 08/24/2019 0911   CHOLHDL 2.6 06/30/2023 1935   VLDL 52 (H) 06/30/2023 1935   LDLCALC 30 06/30/2023 1935   LDLCALC 62 08/24/2019 0911   LDLCALC  07/25/2018 1446     Comment:     . LDL cholesterol not calculated. Triglyceride levels greater than 400 mg/dL invalidate calculated LDL results. . Reference range: <100 . Desirable range <100 mg/dL for primary prevention;   <70 mg/dL for patients with CHD or diabetic patients  with > or = 2 CHD risk factors. SABRA LDL-C is now calculated using the Martin-Hopkins  calculation, which is a validated novel method providing  better accuracy than the Friedewald equation in the  estimation of LDL-C.  Gladis APPLETHWAITE et al. SANDREA. 7986;689(80): 2061-2068  (http://education.[employer].com/faq/FAQ164)    LDLDIRECT 103.2 06/15/2012 1446     Wt Readings from Last 3 Encounters:  01/27/24 179 lb (81.2 kg)  12/15/23 160 lb 3.2 oz (72.7 kg)  06/28/23 240 lb 1.3 oz (108.9 kg)    Assessment and Plan:   1. CAD with stable angina: Last cardiac cath April 2013 with stable disease, bypasses. No chest pain. He does not tolerate ASA due to GI upset. Continue Plavix , statin, beta blocker, Imdur  and Ranexa .   2. HYPERLIPIDEMIA/Hypertriglyceridemia:. LDL at goal in 2024. Continue statin and Tricor .   3. HTN:  BP is  controlled. Continue current meds.   4. Cardiomyopathy: LVEF=40-45% by echo in may 2025. ***  5. Thoracic aortic aneurysmt: 5.0 cm by echo in May 2025. Will arrange chest CTA  Current medicines are reviewed at length with the patient today.  The patient does not have concerns regarding medicines.  The following changes have been made:  no change  Labs/ tests ordered today include:   No orders of the defined types were placed in this encounter.   Disposition:   FU with me in 12  months  Signed, Lonni Cash, MD 04/20/2024 12:46 PM    [facility] [address] [place], [place], [place]  [address] Phone: [phone]; Fax: [phone]

## 2024-04-21 ENCOUNTER — Ambulatory Visit: Admitting: Cardiovascular Disease

## 2024-05-02 ENCOUNTER — Ambulatory Visit: Admitting: Family Medicine

## 2024-07-17 ENCOUNTER — Emergency Department (HOSPITAL_COMMUNITY)
Admission: EM | Admit: 2024-07-17 | Discharge: 2024-07-17 | Discharge status: Against Medical Advice | Attending: Emergency Medicine | Admitting: Emergency Medicine

## 2024-07-17 DIAGNOSIS — R079 Chest pain, unspecified: Secondary | ICD-10-CM | POA: Insufficient documentation

## 2024-07-17 DIAGNOSIS — Z5321 Procedure and treatment not carried out due to patient leaving prior to being seen by health care provider: Secondary | ICD-10-CM | POA: Diagnosis not present

## 2024-07-17 NOTE — ED Notes (Signed)
 Pt left AMA.  KM

## 2024-07-17 NOTE — ED Notes (Signed)
 Upon entering triage room to obtain vitals and EKG, pt continues to pace room and keeps stating I can't, I can't I can't. Pt keeps stating he just wants something to knock him out Pt educated on importance of obtaining vitals and an EKG but pt still refused. RN notified.

## 2024-07-17 NOTE — ED Triage Notes (Signed)
 Patient arrived with EMS from a gas station , central chest pain this morning after taking an over the counter tab at the gas station . Refused triage staff to take his VS and EKG , refused to speak with RN .
